# Patient Record
Sex: Female | Born: 1935 | Race: White | Hispanic: No | Smoking: Former smoker
Health system: Southern US, Community
[De-identification: ages and names within clinical notes are randomized; demographics above are authoritative.]

## PROBLEM LIST (undated history)

## (undated) DIAGNOSIS — I110 Hypertensive heart disease with heart failure: Secondary | ICD-10-CM

## (undated) DIAGNOSIS — F329 Major depressive disorder, single episode, unspecified: Secondary | ICD-10-CM

## (undated) DIAGNOSIS — Z8673 Personal history of transient ischemic attack (TIA), and cerebral infarction without residual deficits: Secondary | ICD-10-CM

## (undated) DIAGNOSIS — I639 Cerebral infarction, unspecified: Secondary | ICD-10-CM

## (undated) DIAGNOSIS — D509 Iron deficiency anemia, unspecified: Secondary | ICD-10-CM

## (undated) DIAGNOSIS — I5033 Acute on chronic diastolic (congestive) heart failure: Secondary | ICD-10-CM

## (undated) DIAGNOSIS — M810 Age-related osteoporosis without current pathological fracture: Secondary | ICD-10-CM

## (undated) DIAGNOSIS — Z79890 Hormone replacement therapy (postmenopausal): Secondary | ICD-10-CM

## (undated) DIAGNOSIS — S42209A Unspecified fracture of upper end of unspecified humerus, initial encounter for closed fracture: Secondary | ICD-10-CM

## (undated) DIAGNOSIS — J441 Chronic obstructive pulmonary disease with (acute) exacerbation: Secondary | ICD-10-CM

## (undated) DIAGNOSIS — F419 Anxiety disorder, unspecified: Secondary | ICD-10-CM

## (undated) DIAGNOSIS — E785 Hyperlipidemia, unspecified: Secondary | ICD-10-CM

## (undated) DIAGNOSIS — I729 Aneurysm of unspecified site: Secondary | ICD-10-CM

## (undated) DIAGNOSIS — D72829 Elevated white blood cell count, unspecified: Secondary | ICD-10-CM

## (undated) DIAGNOSIS — G20 Parkinson's disease: Secondary | ICD-10-CM

## (undated) DIAGNOSIS — I10 Essential (primary) hypertension: Secondary | ICD-10-CM

## (undated) DIAGNOSIS — F0280 Dementia in other diseases classified elsewhere without behavioral disturbance: Secondary | ICD-10-CM

## (undated) DIAGNOSIS — G214 Vascular parkinsonism: Secondary | ICD-10-CM

## (undated) DIAGNOSIS — L89154 Pressure ulcer of sacral region, stage 4: Secondary | ICD-10-CM

## (undated) HISTORY — DX: Essential (primary) hypertension: I10

## (undated) HISTORY — DX: Major depressive disorder, single episode, unspecified: F32.9

## (undated) HISTORY — DX: Unspecified fracture of upper end of unspecified humerus, initial encounter for closed fracture: S42.209A

## (undated) HISTORY — DX: Dementia in other diseases classified elsewhere without behavioral disturbance: F02.80

## (undated) HISTORY — DX: Parkinson's disease: G20

## (undated) HISTORY — DX: Aneurysm of unspecified site: I72.9

## (undated) HISTORY — DX: Age-related osteoporosis without current pathological fracture: M81.0

## (undated) HISTORY — DX: Pressure ulcer of sacral region, stage 4: L89.154

## (undated) HISTORY — DX: Acute on chronic diastolic (congestive) heart failure: I50.33

## (undated) HISTORY — DX: Hyperlipidemia, unspecified: E78.5

## (undated) HISTORY — DX: Hormone replacement therapy: Z79.890

## (undated) HISTORY — DX: Hypertensive heart disease with heart failure: I11.0

## (undated) HISTORY — DX: Cerebral infarction, unspecified: I63.9

## (undated) HISTORY — DX: Chronic obstructive pulmonary disease with (acute) exacerbation: J44.1

## (undated) HISTORY — DX: Personal history of transient ischemic attack (TIA), and cerebral infarction without residual deficits: Z86.73

## (undated) HISTORY — DX: Elevated white blood cell count, unspecified: D72.829

## (undated) HISTORY — DX: Anxiety disorder, unspecified: F41.9

## (undated) HISTORY — DX: Vascular parkinsonism: G21.4

## (undated) HISTORY — DX: Iron deficiency anemia, unspecified: D50.9

## (undated) HISTORY — PX: EYE SURGERY: SHX253

---

## 1966-05-08 HISTORY — PX: PARTIAL HYSTERECTOMY: SHX80

## 1997-12-08 ENCOUNTER — Other Ambulatory Visit: Admission: RE | Admit: 1997-12-08 | Discharge: 1997-12-08 | Payer: Self-pay | Admitting: Family Medicine

## 2000-01-25 ENCOUNTER — Encounter: Admission: RE | Admit: 2000-01-25 | Discharge: 2000-01-25 | Payer: Self-pay | Admitting: Family Medicine

## 2000-01-25 ENCOUNTER — Encounter: Payer: Self-pay | Admitting: Family Medicine

## 2002-02-04 ENCOUNTER — Encounter: Payer: Self-pay | Admitting: Family Medicine

## 2002-02-04 ENCOUNTER — Encounter: Admission: RE | Admit: 2002-02-04 | Discharge: 2002-02-04 | Payer: Self-pay | Admitting: Family Medicine

## 2007-05-14 ENCOUNTER — Encounter: Admission: RE | Admit: 2007-05-14 | Discharge: 2007-05-14 | Payer: Self-pay | Admitting: *Deleted

## 2008-10-06 DIAGNOSIS — I639 Cerebral infarction, unspecified: Secondary | ICD-10-CM

## 2008-10-06 HISTORY — DX: Cerebral infarction, unspecified: I63.9

## 2008-10-09 ENCOUNTER — Encounter: Payer: Self-pay | Admitting: Emergency Medicine

## 2008-10-09 ENCOUNTER — Ambulatory Visit: Payer: Self-pay | Admitting: Diagnostic Radiology

## 2008-10-10 ENCOUNTER — Ambulatory Visit: Payer: Self-pay | Admitting: Cardiology

## 2008-10-10 ENCOUNTER — Inpatient Hospital Stay (HOSPITAL_COMMUNITY): Admission: EM | Admit: 2008-10-10 | Discharge: 2008-10-13 | Payer: Self-pay | Admitting: Internal Medicine

## 2008-10-12 ENCOUNTER — Encounter (INDEPENDENT_AMBULATORY_CARE_PROVIDER_SITE_OTHER): Payer: Self-pay | Admitting: Internal Medicine

## 2008-10-12 ENCOUNTER — Ambulatory Visit: Payer: Self-pay | Admitting: Vascular Surgery

## 2008-10-12 ENCOUNTER — Ambulatory Visit: Payer: Self-pay | Admitting: Physical Medicine & Rehabilitation

## 2010-08-15 LAB — COMPREHENSIVE METABOLIC PANEL
Albumin: 4.4 g/dL (ref 3.5–5.2)
BUN: 17 mg/dL (ref 6–23)
Chloride: 102 mEq/L (ref 96–112)
Creatinine, Ser: 1.1 mg/dL (ref 0.4–1.2)
GFR calc non Af Amer: 48 mL/min — ABNORMAL LOW (ref >60)
Total Bilirubin: 0.2 mg/dL — ABNORMAL LOW (ref 0.3–1.2)

## 2010-08-15 LAB — CARDIAC PANEL(CRET KIN+CKTOT+MB+TROPI)
CK, MB: 2.2 ng/mL (ref 0.3–4.0)
CK, MB: 2.4 ng/mL (ref 0.3–4.0)
Relative Index: INVALID (ref 0.0–2.5)
Relative Index: INVALID (ref 0.0–2.5)
Total CK: 60 U/L (ref 7–177)
Total CK: 60 U/L (ref 7–177)
Troponin I: 0.02 ng/mL (ref 0.00–0.06)

## 2010-08-15 LAB — PROTIME-INR: Prothrombin Time: 12.4 seconds (ref 11.6–15.2)

## 2010-08-15 LAB — URINALYSIS, ROUTINE W REFLEX MICROSCOPIC
Ketones, ur: NEGATIVE mg/dL
Nitrite: NEGATIVE
Protein, ur: NEGATIVE mg/dL
Urobilinogen, UA: 0.2 mg/dL (ref 0.0–1.0)

## 2010-08-15 LAB — LIPID PANEL
LDL Cholesterol: 140 mg/dL — ABNORMAL HIGH (ref 0–99)
Triglycerides: 130 mg/dL (ref <150)
VLDL: 26 mg/dL (ref 0–40)

## 2010-08-15 LAB — CBC
HCT: 38.8 % (ref 36.0–46.0)
HCT: 43.4 % (ref 36.0–46.0)
MCHC: 33.5 g/dL (ref 30.0–36.0)
MCHC: 34 g/dL (ref 30.0–36.0)
MCV: 92.7 fL (ref 78.0–100.0)
MCV: 92.7 fL (ref 78.0–100.0)
Platelets: 192 10*3/uL (ref 150–400)
Platelets: 213 10*3/uL (ref 150–400)
RBC: 4.19 MIL/uL (ref 3.87–5.11)
RDW: 11.7 % (ref 11.5–15.5)
WBC: 10.4 10*3/uL (ref 4.0–10.5)
WBC: 9.4 10*3/uL (ref 4.0–10.5)

## 2010-08-15 LAB — APTT: aPTT: 35 seconds (ref 24–37)

## 2010-08-15 LAB — POCT CARDIAC MARKERS
CKMB, poc: 1.4 ng/mL (ref 1.0–8.0)
Myoglobin, poc: 50.8 ng/mL (ref 12–200)

## 2010-08-15 LAB — BASIC METABOLIC PANEL
BUN: 13 mg/dL (ref 6–23)
CO2: 27 mEq/L (ref 19–32)
Chloride: 107 mEq/L (ref 96–112)
Potassium: 3.5 mEq/L (ref 3.5–5.1)

## 2010-08-15 LAB — HOMOCYSTEINE: Homocysteine: 7.1 umol/L (ref 4.0–15.4)

## 2010-08-15 LAB — DIFFERENTIAL
Basophils Absolute: 0 10*3/uL (ref 0.0–0.1)
Lymphocytes Relative: 29 % (ref 12–46)
Monocytes Absolute: 0.8 10*3/uL (ref 0.1–1.0)
Neutro Abs: 6.4 10*3/uL (ref 1.7–7.7)

## 2010-08-15 LAB — TSH: TSH: 2.09 u[IU]/mL (ref 0.350–4.500)

## 2010-08-15 LAB — POCT TOXICOLOGY PANEL

## 2010-08-15 LAB — RPR: RPR Ser Ql: NONREACTIVE

## 2010-09-20 NOTE — Consult Note (Signed)
NAME:  Beverly Black, Beverly Black NO.:  [id number]   MEDICAL RECORD NO.:  [id number]          PATIENT TYPE:  INP   LOCATION:  1408                         FACILITY:  [facility]   PHYSICIAN:  Noel Christmas, MD    DATE OF BIRTH:  [birth year]   DATE OF CONSULTATION:  10/10/2008  DATE OF DISCHARGE:                                 CONSULTATION   NEUROLOGICAL CONSULTATION   REASON FOR CONSULTATION:  Acute left cerebral infarction.   HISTORY OF PRESENT ILLNESS:  This is a 77 year old lady who presented  with right sided weakness and slurred speech to [facility] emergency room last night.  The patient woke up with difficulty  with ambulation and right upper extremity weakness as well as slurred  speech.  The slurred speech worsening during the day.  Symptoms improved  while she was in the emergency room on initial evaluation.  CT scan of  the head showed no acute intracranial abnormalities.  MRI study today  showed findings consistent with left acute thalamic stroke with  extension to the corona radiata region.  Underlying bilateral small  vessel microischemic changes were also noted.  MRA showed  atherosclerotic changes as well as fenestration of the vertebrobasilar  junction, hypoplastic or distally stenotic right vertebral artery as  well as distal middle cerebral artery and PCA branch attentuation  compatible with chronic small vessel disease.  No significant occlusive  disease was seen.  The patient has no previous history of stroke or TIA  clinically.  She has not been on antiplatelet therapy.   PAST MEDICAL HISTORY:  Is remarkable for:  1. Hyperlipidemia.  2. Hypertension.  3. Chronic anxiety.  4. Allergies.   CURRENT MEDICATIONS:  1. Lisinopril 20 mg per day.  2. Zocor 40 mg per day.  3. Allegra 180 mg per day.  4. Mirtazapine 30 mg at bedtime.  5. Atenolol 50 mg twice a day.  6. Darvocet-N 100, 1 to 2 per day p.r.n.  7. Premarin 0.3 mg per day.  8. Lorazepam 0.5 mg t.i.d. p.r.n. anxiety.   FAMILY HISTORY:  Is positive for stroke involving her father.  The  patient is not aware of any additional family history.  Most of her  family lives in [place] and she apparently does not have close contact  with them.   PHYSICAL EXAMINATION:  NEUROLOGICAL:  Appearance was that of an elderly  lady of slender to medium build who was alert and cooperative and in no  acute distress.  She was well oriented to time as well as place.  Short  term and long term memory were normal.  Affect was appropriate.  Patient  had no indication of aphasia.  Pupils were equal and reacted normally to  light.  Extraocular movements were full and conjugate.  Visual fields  were intact and normal.  There was no facial numbness and no facial  weakness.  Hearing was normal.  Speech and algorithm were normal.  Motor  exam showed mild pronator drift of the right upper extremity.  She had  reduced hand grip on the  right as well which was mild (4/5) compared to  the left.  Strength in the lower extremities was normal proximally and  distally.  Deep tendon reflexes were 1+ and symmetrical in the upper  extremities and at the knees; absent at the ankles.  Plantar responses  were flexor.  Sensory examination was normal.  Carotid auscultation was  normal.   CLINICAL IMPRESSION:  Acute left thalamic and corona radiata infarction,  most likely due to small vessel disease associated with longstanding  hypertension.   RECOMMENDATIONS:  1. MRA of the neck with contrast.  2. Echocardiogram as planned.  3. Aspirin 81 mg per day.  4. Physical therapy and occupational therapy consults.   Thank you for asking me to evaluate Ms. Comas.      Noel Christmas, MD  Electronically Signed     CS/MEDQ  D:  10/10/2008  T:  10/11/2008  Job:  [id number]

## 2010-09-20 NOTE — H&P (Signed)
NAME:  Beverly Black, Beverly Black NO.:  [id number]   MEDICAL RECORD NO.:  [id number]          PATIENT TYPE:  INP   LOCATION:  1408                         FACILITY:  [facility]   PHYSICIAN:  Vania Rea, M.D. DATE OF BIRTH:  [birth year]   DATE OF ADMISSION:  10/10/2008  DATE OF DISCHARGE:                              HISTORY & PHYSICAL   PRIMARY CARE PHYSICIAN:  Bertram Millard. Hyacinth Meeker, M.D. of [facility].   CHIEF COMPLAINT:  Transient ischemic attack.   HISTORY OF PRESENT ILLNESS:  This is a 77 year old Caucasian lady who by  history, woke up with right-sided weakness and difficult to walk  yesterday, but chose not to come to the doctor.  Her son noticed the  right-sided weakness later in the day and brought her to the [facility]  Emergency Room sometime after 9 p.m. at which time the weakness had  resolved, but she was noted to have markedly elevated blood pressure and  the Hospitalist Service was called to assist with management.  The  patient was transferred to the [facility] Emergency Room for admission  with preliminary orders.  However, on arrival to the patient's room at  3:15 to get her complete history and physical, the patient is somewhat  irritable.  She is refusing to give any history and refusing to be  examined.  The history is constructed by reviewing the chart and  superficial inspection of this patient.  She currently denies any  problems and also denies having had any weakness this morning.   PAST MEDICAL HISTORY:  1. Hypertension.  2. Hyperlipidemia.   MEDICATIONS:  1. Darvocet 100 one-two tablets daily.  2. Premarin 0.3 mg daily.  3. Lorazepam 0.5 mg 3 times daily as needed.  4. Lisinopril 20 mg daily.  5. Zocor 40 mg daily.  6. Allegra 180 mg daily.  7. Mirtazapine 30 mg at bedtime.  8. Atenolol 50 mg twice daily.   ALLERGIES:  IODINE.   SOCIAL HISTORY:  Reportedly does not drink.  Possibly a smoker.   FAMILY HISTORY:  Unable to  obtain.   REVIEW OF SYSTEMS:  Unable to obtain further.   PHYSICAL EXAMINATION:  GENERAL:  Thin, elderly and irritable lady lying  in bed.  She professes to be oriented to place, but refuses to state  where she is.  She appears to have a right-sided facial weakness.  She  is in no acute distress.  VITAL SIGNS:  Temperature is 98.1, pulse 65, respirations 18, blood  pressure is 179/83. Initially at [facility] Emergency Room, her blood  pressure was 234/101 and she did receive one dose of clonidine 0.1 mg.  Other than this, we are unable to assess.  EXTREMITIES:  The patient is definitely moving both arms equally, but  unable to perform any other exam.   LABORATORY DATA:  CBC is reviewed and is completely normal with a white  count of 10.4, hemoglobin 14.8, platelets of 213.  Her PT/INR and PTT  are normal.  Her urine drug screen and alcohol level were normal.  Complete metabolic panel significant  for glucose of 133, BUN of 17,  creatinine of 1.1, it is otherwise completely normal.  Her cardiac  enzymes are likewise completely normal.  Urinalysis again is completely  normal.  Her urine drug screen was positive for benzodiazepines.   DIAGNOSTICS:  1. CT scan of the brain shows mild diffuse cerebral and cerebellar      atrophy, moderate chronic small vessel white matter ischemic      changes, acute right sphenoid sinusitis and chronic right ethmoid      sinusitis.  2. Two-view of her chest shows mild changes of COPD, previous      granulomatous infection.  No acute abnormality.   ASSESSMENT:  The patient will be admitted with diagnosis of acute stroke  since she continues to have right-sided facial weakness.   PLAN:  We will allow permissive hypertension, but treat systolic blood  pressure greater than 200.  Because her EKG shows ST depression, we will  go ahead and cycle her cardiac enzymes.  We will hydrate her.  We will  check her cardiovascular risk factors and order an MRI.  She  may benefit  from a neurological evaluation later during the day.  Other plans as per  orders.      Vania Rea, M.D.  Electronically Signed     LC/MEDQ  D:  10/10/2008  T:  10/10/2008  Job:  [id number]   cc:   Bertram Millard. Hyacinth Meeker, M.D.  Fax: [phone]

## 2010-09-20 NOTE — Discharge Summary (Signed)
NAME:  Beverly Black, Beverly Black NO.:  [id number]   MEDICAL RECORD NO.:  [id number]          PATIENT TYPE:  INP   LOCATION:  1408                         FACILITY:  [facility]   PHYSICIAN:  Marcellus Scott, MD     DATE OF BIRTH:  [birth year]   DATE OF ADMISSION:  10/10/2008  DATE OF DISCHARGE:                               DISCHARGE SUMMARY   PRIMARY MEDICAL DOCTOR:  Beverly Black Lefevre, MD   DISCHARGE DIAGNOSES:  1. Acute left thalamic and corona radiata cerebrovascular      accident/Infarct.  2. Uncontrolled hypertension.  3. Hyperlipidemia.  4. Tobacco abuse.   DISCHARGE MEDICATIONS:  1. Darvocet-N 100, 1 or 2 tablets p.o. daily.  2. Premarin 0.3 mg p.o. daily.  3. Lorazepam 0.5 mg p.o. t.i.d. p.r.n. for anxiety.  4. Lisinopril 20 mg p.o. daily.  5. Allegra 180 mg p.o. daily.  6. Mirtazapine 30 mg p.o. q.h.s.  7. Atenolol 50 mg p.o. b.i.d.  8. Vytorin 10/40 one p.o. q.h.s.  9. Enteric-coated aspirin 81 mg p.o. daily.   DISCONTINUED MEDICATIONS:  Zocor.   PROCEDURES:  1. Bilateral carotid artery Dopplers.  Preliminary report is 40-59%      ICA stenosis bilaterally.  2. MRA of the neck without and with contrast.  Impression:      a.     High-grade stenosis of the proximal vertebral arteries       bilaterally.  The left vertebral artery is dominant.  There is       likely very slow flow in the residual right vertebral artery given       the lack of signal on time of flight imaging.      b.     No significant stenosis of the proximal internal carotid       artery.      c.     High-grade stenosis of the proximal right external carotid       artery.  3. MRI of the head without contrast.  Impression:      a.     Acute focal infarct in the lateral left thalamus extending       into the corona radiata.      b.     Extensive periventricular subcortical white matter disease       likely reflects the sequelae of chronic microvascular ischemia.      c.     Extensive sinus disease  with both acute and chronic       findings.  4. MRA of the head.      a.     Distal MCA and PCA branch vessel attenuation bilaterally       compatible with chronic small-vessel disease.      b.     Fenestration of the vertebral-basilar junction.      c.     Hypoplastic or distally stenotic right vertebral artery.      d.     No significant proximal occlusion or aneurysm.  5. Chest x-ray on Jon 4.  Impression:      a.  Marked changes of COPD.      b.     Previous granulomatous infection.      c.     No acute abnormality.  6. CT of the head without contrast.  Impression:      a.     Mild diffuse cerebral and cerebellar atrophy.      b.     Moderate chronic small-vessel white matter ischemic changes       in both cerebral hemispheres.      c.     Acute right sphenoid sinusitis and chronic right ethmoid       sinusitis.  7. Echocardiogram to be done.   PERTINENT LABORATORY DATA:  Urine culture:  50,000 colonies per  milliliter Lactobacillus species.  Cardiac enzymes cycled and negative.  Thyroid function tests within normal limit.  Homocystine 7.1.  RPR  nonreactive.  Lipid panel with HDL 43, LDL 140.  Basic metabolic panel  unremarkable with BUN 13, creatinine 0.82.  CBC with hemoglobin 13,  hematocrit 38, white blood cell 9.4, platelets 192.  Urine drug screen  positive for benzodiazepines.  Urinalysis was negative for features of  urinary tract infection.  Blood alcohol level less than 5.   CONSULTATIONS:  Neurology, Dr. Roseanne Reno and Dr. Anne Hahn.   HOSPITAL COURSE AND PATIENT DISPOSITION:  Beverly Black is a pleasant 77-  year-old female patient with history of hypertension who on Rosalind 4 woke  up at approximately 4 a.m. with weakness on the right side, slurred  speech.  She did not seek any immediate medical attention.  However,  when her son saw her later in the day he found her to be dragging her  right foot, facial asymmetry and slurred speech.  He brought her to the  [facility] emergency room after 9 p.m.  At this time the  weakness was much better.  We were asked to admit her for further  evaluation and management.   1. Acute left thalamic infarct and corona radiata CVA with right      hemiparesis, facial asymmetry, dysarthria.  The patient was      admitted to telemetry with no arrhythmia alarms.  Her MRI confirmed      the CVA.  The patient was placed on aspirin.  Her antihypertensive      medications were held allowing for permissive hypertension given      the recent stroke.  Her signs and symptoms of right-sided weakness,      slurred speech and facial asymmetry have progressively improved.      Physical therapy and occupational therapy were consulted.  They      recommended an inpatient rehab consult.  The inpatient rehab      physicians saw the patient and thought that the patient had      improved and recommended re-evaluating the patient by physical      therapy and possible discharge home with home therapies.  At this      time we await their input and the echocardiogram results.  The      patient is to follow up with Dr. Anne Hahn as an outpatient in 3      weeks.  2. Uncontrolled hypertension.  The patient is more than 3 days out of      her event.  We will resume her home medications.  3. Hyperlipidemia.  The patient's lipids are still abnormal despite      being on Zocor.  This has been  changed to Vytorin.  Recommend      repeating fasting lipids in 4 weeks.  4. Tobacco abuse.  Cessation counseling done.  The patient declined a      patch.  5. Skin rash on the left lateral mid thigh and back, which initially      started as a blister on the thigh which ruptured.  Unclear if this      is a fungal infection or an allergic reaction.  In any event, this      is to be monitored as an outpatient and treated as deemed      necessary.   At this time will await echocardiogram and physical therapy input prior  to deciding about discharge and  discharge location.   TIME TAKEN IN COORDINATING THIS DISCHARGE:  25 minutes.      Marcellus Scott, MD  Electronically Signed     AH/MEDQ  D:  10/12/2008  T:  10/12/2008  Job:  [id number]   cc:   Beverly Black Lefevre, MD   Noel Christmas, MD  Fax: [phone]   C. Lesia Sago, M.D.  Fax: [phone]

## 2010-11-22 ENCOUNTER — Telehealth: Payer: Self-pay | Admitting: *Deleted

## 2010-11-22 NOTE — Telephone Encounter (Signed)
Pt has not been seen by Dr Beverely Low yet, I wrote that on fax with notation that we cannot fill rx. (Lorazepam).

## 2010-11-30 ENCOUNTER — Ambulatory Visit (INDEPENDENT_AMBULATORY_CARE_PROVIDER_SITE_OTHER): Payer: 59 | Admitting: Family Medicine

## 2010-11-30 ENCOUNTER — Encounter: Payer: Self-pay | Admitting: Family Medicine

## 2010-11-30 DIAGNOSIS — F411 Generalized anxiety disorder: Secondary | ICD-10-CM

## 2010-11-30 DIAGNOSIS — Z79890 Hormone replacement therapy (postmenopausal): Secondary | ICD-10-CM | POA: Insufficient documentation

## 2010-11-30 DIAGNOSIS — F419 Anxiety disorder, unspecified: Secondary | ICD-10-CM

## 2010-11-30 DIAGNOSIS — I639 Cerebral infarction, unspecified: Secondary | ICD-10-CM | POA: Insufficient documentation

## 2010-11-30 DIAGNOSIS — I10 Essential (primary) hypertension: Secondary | ICD-10-CM

## 2010-11-30 DIAGNOSIS — M259 Joint disorder, unspecified: Secondary | ICD-10-CM

## 2010-11-30 DIAGNOSIS — I110 Hypertensive heart disease with heart failure: Secondary | ICD-10-CM | POA: Insufficient documentation

## 2010-11-30 DIAGNOSIS — E785 Hyperlipidemia, unspecified: Secondary | ICD-10-CM

## 2010-11-30 DIAGNOSIS — R29898 Other symptoms and signs involving the musculoskeletal system: Secondary | ICD-10-CM | POA: Insufficient documentation

## 2010-11-30 DIAGNOSIS — I6350 Cerebral infarction due to unspecified occlusion or stenosis of unspecified cerebral artery: Secondary | ICD-10-CM

## 2010-11-30 HISTORY — DX: Hormone replacement therapy: Z79.890

## 2010-11-30 HISTORY — DX: Hypertensive heart disease with heart failure: I11.0

## 2010-11-30 HISTORY — DX: Cerebral infarction, unspecified: I63.9

## 2010-11-30 HISTORY — DX: Anxiety disorder, unspecified: F41.9

## 2010-11-30 LAB — TSH: TSH: 1.84 u[IU]/mL (ref 0.35–5.50)

## 2010-11-30 LAB — CBC WITH DIFFERENTIAL/PLATELET
Basophils Absolute: 0.1 10*3/uL (ref 0.0–0.1)
Eosinophils Absolute: 0.4 10*3/uL (ref 0.0–0.7)
Lymphocytes Relative: 30.4 % (ref 12.0–46.0)
MCHC: 34.1 g/dL (ref 30.0–36.0)
Neutrophils Relative %: 58.9 % (ref 43.0–77.0)
Platelets: 256 10*3/uL (ref 150.0–400.0)
RDW: 12.9 % (ref 11.5–14.6)

## 2010-11-30 LAB — HEPATIC FUNCTION PANEL
ALT: 14 U/L (ref 0–35)
AST: 18 U/L (ref 0–37)
Alkaline Phosphatase: 64 U/L (ref 39–117)
Bilirubin, Direct: 0 mg/dL (ref 0.0–0.3)
Total Bilirubin: 0.3 mg/dL (ref 0.3–1.2)

## 2010-11-30 LAB — BASIC METABOLIC PANEL
Chloride: 104 mEq/L (ref 96–112)
Creatinine, Ser: 0.8 mg/dL (ref 0.4–1.2)
Potassium: 4.1 mEq/L (ref 3.5–5.1)
Sodium: 138 mEq/L (ref 135–145)

## 2010-11-30 LAB — LDL CHOLESTEROL, DIRECT: Direct LDL: 140.8 mg/dL

## 2010-11-30 LAB — LIPID PANEL: HDL: 48.6 mg/dL (ref >39.00)

## 2010-11-30 NOTE — Progress Notes (Signed)
  Subjective:    Patient ID: Beverly Black, female    DOB: [birth year], 77 y.o.   MRN: [id number]  HPI New to establish.  Previous MD- Jacalyn Lefevre  HTN- chronic problem for pt, excellent control today.  On Norvasc, Atenolol, lisinopril.  No CP, SOB, HAs, visual changes, edema.  Hyperlipidemia- chronic problem for pt, on Lipitor.  Last had labs done in October.  No abd pain, N/V, myalgias  Hx of CVA- sees Dr Pearlean Brownie, stroke occurred 6/10.  Walks w/ a cane.  Has residual R sided facial droop and R hand weakness.  Hormone replacement tx- has been on Premarin for 'years and years', ~19 yrs.  Feels 'if it hasn't caused me problems by now, why change it?'  Anxiety- chronic problem for pt, on Lorazepam.  This controls sxs fairly well.  Weak Ankle- L ankle, has been a problem for pt for years.  Neuro suggested ortho referral.  Pt and daughter now interested in this.  Health Maintenance- had mammo and DEXA 2 yrs ago, has never had colonoscopy- not interested.   Review of Systems For ROS see HPI     Objective:   Physical Exam  Vitals reviewed. Constitutional: She is oriented to person, place, and time. She appears well-developed and well-nourished. No distress.  HENT:  Head: Normocephalic and atraumatic.       Mild R facial droop  Eyes: Conjunctivae and EOM are normal. Pupils are equal, round, and reactive to light.  Neck: Neck supple. No thyromegaly present.  Cardiovascular: Normal rate, regular rhythm, normal heart sounds and intact distal pulses.   Pulmonary/Chest: Effort normal and breath sounds normal. No respiratory distress. She has no wheezes. She has no rales.  Abdominal: Soft. Bowel sounds are normal. She exhibits no distension. There is no tenderness. There is no rebound.  Musculoskeletal: She exhibits no edema.  Lymphadenopathy:    She has no cervical adenopathy.  Neurological: She is alert and oriented to person, place, and time.  Skin: Skin is warm and dry.  Psychiatric:  She has a normal mood and affect. Her behavior is normal. Judgment and thought content normal.          Assessment & Plan:

## 2010-11-30 NOTE — Patient Instructions (Signed)
Follow up in 6 months for your complete physical Please ask Dr Pearlean Brownie about the Premarin- we can wean you off this if need be We'll notify you of your lab results Someone will call you with your ortho appt Call with any questions or concerns Welcome!  We're glad to have you!

## 2010-12-02 ENCOUNTER — Telehealth: Payer: Self-pay | Admitting: *Deleted

## 2010-12-02 MED ORDER — ATORVASTATIN CALCIUM 20 MG PO TABS: 20.0000 mg | ORAL_TABLET | Freq: Every day | ORAL | Status: DC

## 2010-12-02 NOTE — Telephone Encounter (Signed)
Message copied by Leanne Lovely on Fri Dec 02, 2010  3:22 PM ------      Message from: Sheliah Hatch      Created: Thu Dec 01, 2010  7:57 AM       Based on elevated total cholesterol, LDL and triglycerides would recommend increasing Lipitor to 20mg  nightly.            Remainder of labs look good!

## 2010-12-02 NOTE — Telephone Encounter (Signed)
Pt is aware.  

## 2010-12-06 ENCOUNTER — Encounter: Payer: Self-pay | Admitting: Family Medicine

## 2010-12-08 ENCOUNTER — Other Ambulatory Visit: Payer: Self-pay | Admitting: *Deleted

## 2010-12-08 MED ORDER — ATENOLOL 50 MG PO TABS: 50.0000 mg | ORAL_TABLET | Freq: Two times a day (BID) | ORAL | Status: DC

## 2010-12-08 NOTE — Telephone Encounter (Signed)
Rx faxed

## 2010-12-18 NOTE — Assessment & Plan Note (Signed)
Refer to ortho at pt's request.  This was previously recommended by neuro.  Will f/u on recommendations.

## 2010-12-18 NOTE — Assessment & Plan Note (Signed)
Pt does not take controller med.  Has been taking benzos 'for years' w/ good sxs control.  Although this regimen is not ideal it will likely be very difficult to change after all this time.  Will leave current regimen and follow.

## 2010-12-18 NOTE — Assessment & Plan Note (Signed)
Follows w/ neuro regularly.  Has some residual deficits.  Ambulates w/ cane.  Goal is risk reduction.

## 2010-12-18 NOTE — Assessment & Plan Note (Signed)
Due for labs.  Goal is <70 due to CVA.  Adjust meds prn.

## 2010-12-18 NOTE — Assessment & Plan Note (Signed)
Well controlled today.  Tolerating meds w/out difficulty.  No changes.

## 2010-12-18 NOTE — Assessment & Plan Note (Signed)
Given pt's age and the fact that she's already had CVA, encouraged her to discuss weaning off this medicine w/ neuro.  If neuro wants her to stop it, we can work w/ her on a slow taper/wean.  If neuro is comfortable w/ her remaining on it, will most likely leave it alone since it has been nearly 20 yrs.  Pt expressed understanding and is in agreement w/ plan.

## 2010-12-23 ENCOUNTER — Other Ambulatory Visit: Payer: Self-pay | Admitting: Family Medicine

## 2010-12-23 MED ORDER — LORAZEPAM 0.5 MG PO TABS: 0.5000 mg | ORAL_TABLET | Freq: Three times a day (TID) | ORAL | Status: DC | PRN

## 2010-12-23 MED ORDER — TRAMADOL HCL 50 MG PO TABS: 50.0000 mg | ORAL_TABLET | Freq: Three times a day (TID) | ORAL | Status: DC | PRN

## 2010-12-23 NOTE — Telephone Encounter (Signed)
rx called into pharmacy

## 2010-12-23 NOTE — Telephone Encounter (Signed)
Ok for #90 w/ 1 refill on each

## 2010-12-23 NOTE — Telephone Encounter (Signed)
pls advise about pt's tramadol and lorazepam

## 2011-02-07 ENCOUNTER — Other Ambulatory Visit: Payer: Self-pay | Admitting: Family Medicine

## 2011-02-07 MED ORDER — AMLODIPINE BESYLATE 10 MG PO TABS: 10.0000 mg | ORAL_TABLET | Freq: Every day | ORAL | Status: DC

## 2011-02-07 NOTE — Telephone Encounter (Signed)
Done

## 2011-02-13 ENCOUNTER — Other Ambulatory Visit: Payer: Self-pay | Admitting: Family Medicine

## 2011-02-21 ENCOUNTER — Other Ambulatory Visit: Payer: Self-pay | Admitting: Family Medicine

## 2011-02-21 MED ORDER — MIRTAZAPINE 30 MG PO TABS: 30.0000 mg | ORAL_TABLET | Freq: Every day | ORAL | Status: DC

## 2011-02-21 NOTE — Telephone Encounter (Signed)
Ok for #90, 3 refills 

## 2011-02-21 NOTE — Telephone Encounter (Signed)
Last OV 11/30/10. Last fill unknown

## 2011-02-24 ENCOUNTER — Other Ambulatory Visit: Payer: Self-pay | Admitting: *Deleted

## 2011-02-24 MED ORDER — LORAZEPAM 0.5 MG PO TABS: 0.5000 mg | ORAL_TABLET | Freq: Three times a day (TID) | ORAL | Status: DC | PRN

## 2011-02-24 NOTE — Telephone Encounter (Signed)
Ok for #30 unless pt wants to wait for Dr Laury Axon on Monday to approve #90

## 2011-02-24 NOTE — Telephone Encounter (Signed)
Last seen 11-30-10 last filled 01-23-11 #90, please advise

## 2011-02-24 NOTE — Telephone Encounter (Signed)
Sorry for the confusion!  Ok for #90, 1 refill

## 2011-02-24 NOTE — Telephone Encounter (Signed)
Rx printed and faxed     KP 

## 2011-02-24 NOTE — Telephone Encounter (Signed)
Your patient 

## 2011-04-03 ENCOUNTER — Ambulatory Visit (INDEPENDENT_AMBULATORY_CARE_PROVIDER_SITE_OTHER): Payer: 59 | Admitting: Family Medicine

## 2011-04-03 ENCOUNTER — Encounter: Payer: Self-pay | Admitting: Family Medicine

## 2011-04-03 DIAGNOSIS — M62838 Other muscle spasm: Secondary | ICD-10-CM

## 2011-04-03 DIAGNOSIS — H6120 Impacted cerumen, unspecified ear: Secondary | ICD-10-CM

## 2011-04-03 NOTE — Progress Notes (Signed)
  Subjective:    Patient ID: Beverly Black, female    DOB: Mar 05, 1929, 77 y.o.   MRN: [id number]  HPI L ear clogged- 'feels like something's buzzing in there'.  No pain.  Intermittent.  'it's like i'm in a barrel'  Impacting hearing.  sxs started 1-2 weeks ago.  Neck pain- sxs started Thursday.  Pain w/ turning head to R, will radiate into R shoulder.  Pain improves w/ Advil.  No change in pillows, mattress, sleeping arrangements.    Review of Systems For ROS see HPI     Objective:   Physical Exam  Vitals reviewed. Constitutional: She appears well-developed and well-nourished. No distress.  HENT:       TMs both obscured by dry wax.  S/p softening w/ H2O2 and irrigation, wax is gone and TMs WNL bilaterally  Neck: Normal range of motion. Neck supple.       R trap spasm w/ TTP  Lymphadenopathy:    She has no cervical adenopathy.          Assessment & Plan:

## 2011-04-03 NOTE — Patient Instructions (Signed)
Your neck pain is due to muscle spasm Use a heating pad Take Advil 2 tabs every 6-8 hrs as needed for pain (take w/ food) Call with any questions or concerns or if not improving Hang in there! Happy Holidays!

## 2011-04-04 DIAGNOSIS — M62838 Other muscle spasm: Secondary | ICD-10-CM | POA: Insufficient documentation

## 2011-04-04 DIAGNOSIS — H6120 Impacted cerumen, unspecified ear: Secondary | ICD-10-CM | POA: Insufficient documentation

## 2011-04-04 NOTE — Assessment & Plan Note (Signed)
Start NSAIDs, heat, muscle relaxer at night.  Reviewed supportive care and red flags that should prompt return.  Pt expressed understanding and is in agreement w/ plan.

## 2011-04-04 NOTE — Assessment & Plan Note (Signed)
Both ears.  Unable to curette due to dryness.  Both TMs visible after irrigation.  Pt reports sxs improved immediately.

## 2011-04-20 ENCOUNTER — Other Ambulatory Visit: Payer: Self-pay | Admitting: Family Medicine

## 2011-04-20 NOTE — Telephone Encounter (Signed)
Last OV 04-03-11 last refill 02-24-11

## 2011-04-20 NOTE — Telephone Encounter (Signed)
Ok for refill x 1 

## 2011-04-21 MED ORDER — LORAZEPAM 0.5 MG PO TABS: 0.5000 mg | ORAL_TABLET | Freq: Three times a day (TID) | ORAL | Status: DC | PRN

## 2011-04-21 NOTE — Telephone Encounter (Signed)
.  rx faxed to pharmacy, manually.  

## 2011-04-21 NOTE — Telephone Encounter (Signed)
Addended by: Derry Lory A on: 04/21/2011 10:39 AM   Modules accepted: Orders

## 2011-05-29 ENCOUNTER — Telehealth: Payer: Self-pay | Admitting: Family Medicine

## 2011-05-29 MED ORDER — AMLODIPINE BESYLATE 10 MG PO TABS: 10.0000 mg | ORAL_TABLET | Freq: Every day | ORAL | Status: DC

## 2011-05-29 NOTE — Telephone Encounter (Signed)
Refill- amlodipine 10mg  tab amne. Take one tablet by mouth at bedtime. Qty 30 last fill 10.2.12

## 2011-05-29 NOTE — Telephone Encounter (Signed)
rx sent to pharmacy by e-script  

## 2011-06-05 ENCOUNTER — Ambulatory Visit (INDEPENDENT_AMBULATORY_CARE_PROVIDER_SITE_OTHER): Payer: 59 | Admitting: Family Medicine

## 2011-06-05 ENCOUNTER — Encounter: Payer: Self-pay | Admitting: Family Medicine

## 2011-06-05 DIAGNOSIS — I10 Essential (primary) hypertension: Secondary | ICD-10-CM

## 2011-06-05 DIAGNOSIS — H6120 Impacted cerumen, unspecified ear: Secondary | ICD-10-CM

## 2011-06-05 DIAGNOSIS — E785 Hyperlipidemia, unspecified: Secondary | ICD-10-CM

## 2011-06-05 LAB — HEPATIC FUNCTION PANEL
ALT: 17 U/L (ref 0–35)
Albumin: 4.1 g/dL (ref 3.5–5.2)
Bilirubin, Direct: 0.1 mg/dL (ref 0.0–0.3)
Total Protein: 7.2 g/dL (ref 6.0–8.3)

## 2011-06-05 LAB — BASIC METABOLIC PANEL
BUN: 11 mg/dL (ref 6–23)
CO2: 28 mEq/L (ref 19–32)
Calcium: 9.3 mg/dL (ref 8.4–10.5)
Creatinine, Ser: 0.7 mg/dL (ref 0.4–1.2)
Glucose, Bld: 90 mg/dL (ref 70–99)

## 2011-06-05 LAB — LIPID PANEL
Cholesterol: 225 mg/dL — ABNORMAL HIGH (ref 0–200)
HDL: 46.2 mg/dL (ref >39.00)
Triglycerides: 207 mg/dL — ABNORMAL HIGH (ref 0.0–149.0)

## 2011-06-05 MED ORDER — ATORVASTATIN CALCIUM 20 MG PO TABS: 20.0000 mg | ORAL_TABLET | Freq: Every day | ORAL | Status: DC

## 2011-06-05 NOTE — Assessment & Plan Note (Signed)
Chronic problem.  Due for labs.  Tolerating statin w/out difficulty.  Check labs.  Adjust meds prn

## 2011-06-05 NOTE — Assessment & Plan Note (Signed)
Recurrent problem.  L ear curetted and then dry, hard ball of wax successfully irrigated from canal w/ resolution of muffled hearing.

## 2011-06-05 NOTE — Progress Notes (Signed)
  Subjective:    Patient ID: Beverly Black, female    DOB: Apr 07, 1929, 78 y.o.   MRN: [id number]  HPI HTN- chronic problem.  Excellent control on Norvasc, Lisinopril, Atenolol.  Denies CP, SOB, HAs, visual changes, edema.  Continues to smoke- not interested in quitting.  Hyperlipidemia- chronic problem.  Due for labs.  On Lipitor.  Denies abd pain, N/V, myalgias.  Hearing loss/cerumen impaction- ongoing problem for pt.  Currently having sxs in L ear.    Review of Systems For ROS see HPI     Objective:   Physical Exam  Vitals reviewed. Constitutional: She is oriented to person, place, and time. She appears well-developed and well-nourished. No distress.  HENT:  Head: Normocephalic and atraumatic.       R TM WNL L TM obscured by hard, dry cerumen- curetted away from canal wall and then successfully irrigated   Eyes: Conjunctivae and EOM are normal. Pupils are equal, round, and reactive to light.  Neck: Normal range of motion. Neck supple. No thyromegaly present.  Cardiovascular: Normal rate, regular rhythm, normal heart sounds and intact distal pulses.   No murmur heard. Pulmonary/Chest: Effort normal and breath sounds normal. No respiratory distress.  Abdominal: Soft. She exhibits no distension. There is no tenderness.  Musculoskeletal: She exhibits no edema.  Lymphadenopathy:    She has no cervical adenopathy.  Neurological: She is alert and oriented to person, place, and time.  Skin: Skin is warm and dry.  Psychiatric: She has a normal mood and affect. Her behavior is normal.          Assessment & Plan:

## 2011-06-05 NOTE — Assessment & Plan Note (Signed)
Chronic problem.  Stable.  BP well controlled.  Asymptomatic.  Tolerating med w/out difficulty.  No changes.

## 2011-06-05 NOTE — Patient Instructions (Signed)
Please schedule your complete physical in 6 months (30 minute visit)- do not eat before this appt We'll notify you of your lab results and make any med changes if needed Consider quitting smoking Call with any questions or concerns Happy New Year!

## 2011-06-23 ENCOUNTER — Telehealth: Payer: Self-pay | Admitting: Family Medicine

## 2011-06-23 NOTE — Telephone Encounter (Signed)
Last OV 06-05-11 last refill 04-21-11 #90 with 1 refill

## 2011-06-23 NOTE — Telephone Encounter (Signed)
Refill: Lorazepam .5 mg tab ACTA. Last fill 05-29-11

## 2011-06-25 NOTE — Telephone Encounter (Signed)
Ok for #90, 1 refill 

## 2011-06-26 MED ORDER — LORAZEPAM 0.5 MG PO TABS: 0.5000 mg | ORAL_TABLET | Freq: Three times a day (TID) | ORAL | Status: DC | PRN

## 2011-06-26 NOTE — Telephone Encounter (Signed)
.  rx faxed to pharmacy, manually.  

## 2011-07-25 ENCOUNTER — Telehealth: Payer: Self-pay | Admitting: Family Medicine

## 2011-07-25 NOTE — Telephone Encounter (Signed)
Refill for  Lorazepam 0.5mg  tab ranb  Last filled 2.20.13  Last qty in chart shows 90 Last description Take 1 tablet (0.5 mg total) by mouth 3 (three) times daily as needed.

## 2011-07-28 MED ORDER — LORAZEPAM 0.5 MG PO TABS: 0.5000 mg | ORAL_TABLET | Freq: Three times a day (TID) | ORAL | Status: DC | PRN

## 2011-07-28 NOTE — Telephone Encounter (Signed)
Rx sent 

## 2011-07-28 NOTE — Telephone Encounter (Signed)
Refill x1 

## 2011-07-28 NOTE — Telephone Encounter (Signed)
Last filled 06-26-11 #90 1, last OV 06-05-11

## 2011-08-24 ENCOUNTER — Telehealth: Payer: Self-pay | Admitting: Family Medicine

## 2011-08-24 NOTE — Telephone Encounter (Signed)
Refill: Lorazepam 0.5mg tab 

## 2011-08-24 NOTE — Telephone Encounter (Signed)
Ok for #90, 1 refill 

## 2011-08-24 NOTE — Telephone Encounter (Signed)
Last OV 06-05-11, last filled 07-28-11 #90

## 2011-08-25 MED ORDER — LORAZEPAM 0.5 MG PO TABS: 0.5000 mg | ORAL_TABLET | Freq: Three times a day (TID) | ORAL | Status: DC | PRN

## 2011-08-25 NOTE — Telephone Encounter (Signed)
.  rx faxed to pharmacy, manually.  

## 2011-09-29 ENCOUNTER — Telehealth: Payer: Self-pay | Admitting: Family Medicine

## 2011-09-29 MED ORDER — AMLODIPINE BESYLATE 10 MG PO TABS: 10.0000 mg | ORAL_TABLET | Freq: Every day | ORAL | Status: DC

## 2011-09-29 NOTE — Telephone Encounter (Signed)
Refill: Amlodipine besylate 10mg  tab. Take one tablet by mouth at bedtime. Qty 30. Last fill 07-09-11

## 2011-09-29 NOTE — Telephone Encounter (Signed)
rx sent to pharmacy by e-script  

## 2011-10-19 ENCOUNTER — Telehealth: Payer: Self-pay | Admitting: Family Medicine

## 2011-10-19 NOTE — Telephone Encounter (Signed)
Refill: Lorazepam 0.5mg  tablet. Take one tablet by mouth three times daily as needed. Last filled 09-24-11

## 2011-10-20 MED ORDER — LORAZEPAM 0.5 MG PO TABS: 0.5000 mg | ORAL_TABLET | Freq: Three times a day (TID) | ORAL | Status: DC | PRN

## 2011-10-20 NOTE — Telephone Encounter (Signed)
#  90 with 1 refills. Ok to refill?

## 2011-10-20 NOTE — Telephone Encounter (Signed)
Ok for #90, 3 refills 

## 2011-10-20 NOTE — Telephone Encounter (Signed)
Refill done.  

## 2011-10-23 ENCOUNTER — Other Ambulatory Visit: Payer: Self-pay | Admitting: Family Medicine

## 2011-10-23 NOTE — Telephone Encounter (Signed)
refill atenolol tabs 50mg  #180, take one tablet twice a day  Last wrt 10.8.12, last ov 1.28.13

## 2011-10-24 MED ORDER — ATENOLOL 50 MG PO TABS: 50.0000 mg | ORAL_TABLET | Freq: Two times a day (BID) | ORAL | Status: DC

## 2011-10-24 NOTE — Telephone Encounter (Signed)
rx sent to pharmacy by e-script for #180 no refills Letter has been mailed to pt address noted in the chart to advise they are overdue for cpe/ov/labs and the pt needs to contact office to set up appt  Pt was advised to set up CPE by 11-03-11

## 2011-10-31 ENCOUNTER — Other Ambulatory Visit: Payer: Self-pay | Admitting: Family Medicine

## 2011-10-31 MED ORDER — AMLODIPINE BESYLATE 10 MG PO TABS: 10.0000 mg | ORAL_TABLET | Freq: Every day | ORAL | Status: DC

## 2011-10-31 NOTE — Telephone Encounter (Signed)
rx sent to pharmacy by e-script for #90 no refills per noted pt was given discharge instructions for CPE in 6 months from last OV noted 06-05-11, called and spoke to pt to advise a CPE needs to be set-up AFTER 12-03-11 pt understood and will call back once she speaks to her daughter to find out her schedule, advised she does need to fast for this CPE, pt also clarified to send medication to [facility] even though mail order noted in chart, sent via escribe, pt understood all instructions

## 2011-10-31 NOTE — Telephone Encounter (Signed)
refill amlodipine besylate 10 mg tab #90, take one tablet by mouth at bedtime, last fill 06.24.13 Actually written 5.24.13 for qty 30 wt/1-refill, patient is requesting a 90-day supply  Please review and send new rx if approved Last ov 1.28.13

## 2011-11-08 ENCOUNTER — Telehealth: Payer: Self-pay | Admitting: Family Medicine

## 2011-11-08 MED ORDER — ESTROGENS CONJUGATED 0.3 MG PO TABS: 0.3000 mg | ORAL_TABLET | Freq: Every day | ORAL | Status: DC

## 2011-11-08 NOTE — Telephone Encounter (Signed)
Pts daughter states the pt is out of refills on her premarin and is not sure if she is supposed to be taking it or not. She states the neurologist said it was fine. Pt has been out of medication for 1 week and is already experiencing hot flashes. She would like to know if the pt should get a refill.

## 2011-11-08 NOTE — Telephone Encounter (Signed)
Called and spoke to pt to advise her refill has been sent and that MD Tabori gave verbal order to send pt the medication per still ok to take, pt understood, sent via escribe

## 2011-12-07 ENCOUNTER — Ambulatory Visit (INDEPENDENT_AMBULATORY_CARE_PROVIDER_SITE_OTHER): Payer: 59 | Admitting: Family Medicine

## 2011-12-07 ENCOUNTER — Encounter: Payer: Self-pay | Admitting: Family Medicine

## 2011-12-07 ENCOUNTER — Telehealth: Payer: Self-pay | Admitting: *Deleted

## 2011-12-07 VITALS — BP 120/74 | HR 75 | Temp 97.8°F | Ht 64.0 in | Wt 120.0 lb

## 2011-12-07 DIAGNOSIS — E785 Hyperlipidemia, unspecified: Secondary | ICD-10-CM

## 2011-12-07 DIAGNOSIS — Z0000 Encounter for general adult medical examination without abnormal findings: Secondary | ICD-10-CM

## 2011-12-07 DIAGNOSIS — Z1231 Encounter for screening mammogram for malignant neoplasm of breast: Secondary | ICD-10-CM

## 2011-12-07 DIAGNOSIS — Z780 Asymptomatic menopausal state: Secondary | ICD-10-CM

## 2011-12-07 DIAGNOSIS — I10 Essential (primary) hypertension: Secondary | ICD-10-CM

## 2011-12-07 LAB — TSH: TSH: 2.02 u[IU]/mL (ref 0.35–5.50)

## 2011-12-07 LAB — CBC WITH DIFFERENTIAL/PLATELET
Basophils Absolute: 0.1 10*3/uL (ref 0.0–0.1)
Basophils Relative: 0.7 % (ref 0.0–3.0)
Eosinophils Absolute: 0.4 10*3/uL (ref 0.0–0.7)
Eosinophils Relative: 5.1 % — ABNORMAL HIGH (ref 0.0–5.0)
HCT: 40 % (ref 36.0–46.0)
Hemoglobin: 13.5 g/dL (ref 12.0–15.0)
Lymphocytes Relative: 26.5 % (ref 12.0–46.0)
Monocytes Absolute: 0.5 10*3/uL (ref 0.1–1.0)
Monocytes Relative: 6.7 % (ref 3.0–12.0)
Neutro Abs: 4.9 10*3/uL (ref 1.4–7.7)
Platelets: 225 10*3/uL (ref 150.0–400.0)
WBC: 8 10*3/uL (ref 4.5–10.5)

## 2011-12-07 LAB — LIPID PANEL
Cholesterol: 240 mg/dL — ABNORMAL HIGH (ref 0–200)
HDL: 47.8 mg/dL (ref >39.00)
VLDL: 40 mg/dL (ref 0.0–40.0)

## 2011-12-07 LAB — BASIC METABOLIC PANEL
BUN: 11 mg/dL (ref 6–23)
Creatinine, Ser: 0.7 mg/dL (ref 0.4–1.2)
GFR: 86.81 mL/min (ref >60.00)
Potassium: 4 mEq/L (ref 3.5–5.1)

## 2011-12-07 LAB — LDL CHOLESTEROL, DIRECT: Direct LDL: 155.2 mg/dL

## 2011-12-07 LAB — HEPATIC FUNCTION PANEL
Bilirubin, Direct: 0.1 mg/dL (ref 0.0–0.3)
Total Bilirubin: 0.5 mg/dL (ref 0.3–1.2)

## 2011-12-07 NOTE — Telephone Encounter (Signed)
Pt asked questions concerning medications after her OV and MD Beverely Low has stepped into another room, pt noted concern of Lipitor commercials that noted may cause DM in women, pt notes she spoke to pharmacy rep that stated only in very slim cases, pt also advised that she discussed with MD Beverely Low about weening off the Premarin and that MD Tabori advised to consult with Neuro MD, pt advised that her neuro stated she can continue to take the Premarin if she wants to, pt notes she is still taking it however noted the recent refill instructions noted directions to take 21 days straight and off for 7 days, advised that I will speak with MD Tabori and give her a call back about concerns, pt stated "You can leave a message on my answering machine about what she thinks" pt then left office, spoke with MD tabori whom then advised that the study for lipitor was performed on obese pt's that were already at a higher risk of getting the DM due to poor eating habits thus other factors were noted to possibly cause the DM to onset, as well as the fact that the pt can continue the premarin if she so wishes to do so however noted in pt chart that the directions have been to take 21days straight and 7 off for the past year, pt may have not realized until now, called pt to advise, pt understood all directions and will start to take premarin 21 days straight and then hold for 7, MD tabori aware verbally of pt response

## 2011-12-07 NOTE — Patient Instructions (Addendum)
Follow up in 6 months to recheck blood pressure and cholesterol We'll call you with your mammo and bone density appts Please call Dermatology to review your skin- dark spot on back and skin tag on R breast Call with any questions or concerns Happy Early Birthday!!

## 2011-12-07 NOTE — Assessment & Plan Note (Signed)
Chronic problem.  Well controlled.  Asymptomatic.  No changes. 

## 2011-12-07 NOTE — Assessment & Plan Note (Signed)
Pt's CPE WNL w/ exception of known antalgic/shuffling gait and new skin abnormalities.  Pt has Derm- encouraged her to f/u.  Pt declines colonoscopy.  Will refer for mammo and DEXA.  EKG done- see document for interpretation.  Check labs.  Anticipatory guidance provided.

## 2011-12-07 NOTE — Progress Notes (Signed)
  Subjective:    Patient ID: Beverly Black, female    DOB: 1929/01/21, 78 y.o.   MRN: [id number]  HPI Here today for CPE. Neuro- Sethi Risk Factors: HTN- chronic problem, well controlled today.  On Amlodipine, atenolol, Lisinopril.  No CP, SOB, HAs, visual changes, edema, Hyperlipidemia- chronic problem, on Lipitor.  Due for labs.  No N/V, abd pain, myalgias Physical Activity: no formal exercise.  Limited activity Fall Risk: moderate risk, walks w/ stooped posture and ambulates w/ cane.  Admits to difficulty walking. Depression: chronic problem, on Remeron and Lorazepam Hearing: normal to conversational tones, mildly decreased to whispered voice ADL's: independent Cognitive: normal linear thought process, some memory impairment, good concentration Home Safety: lives alone but reports feeling safe.   Good family support Height, Weight, BMI, Visual Acuity: see vitals, vision corrected to 20/20 w/ glasses Counseling: pt declines colonoscopy, overdue for mammo/dexa, no need for paps due to hysterectomy Labs Ordered: See A&P Care Plan: See A&P    Review of Systems Patient reports no vision/ hearing changes, adenopathy,fever, weight change,  persistant/recurrent hoarseness , swallowing issues, chest pain, palpitations, edema, persistant/recurrent cough, hemoptysis, dyspnea (rest/exertional/paroxysmal nocturnal), gastrointestinal bleeding (melena, rectal bleeding), abdominal pain, significant heartburn, bowel changes, GU symptoms (dysuria, hematuria, incontinence), Gyn symptoms (abnormal  bleeding, pain),  syncope, focal weakness, memory loss, numbness & tingling, skin/hair/nail changes, abnormal bruising or bleeding.     Objective:   Physical Exam  General Appearance:    Alert, cooperative, no distress, appears stated age  Head:    Normocephalic, without obvious abnormality, atraumatic  Eyes:    PERRL, conjunctiva/corneas clear, EOM's intact, fundi    benign, both eyes  Ears:    Normal TM's  and external ear canals, both ears  Nose:   Nares normal, septum midline, mucosa normal, no drainage    or sinus tenderness  Throat:   Lips, mucosa, and tongue normal; teeth and gums normal  Neck:   Supple, symmetrical, trachea midline, no adenopathy;    Thyroid: no enlargement/tenderness/nodules  Back:     Symmetric, + kyphosis, ROM normal, no CVA tenderness  Lungs:     Clear to auscultation bilaterally, respirations unlabored  Chest Wall:    No tenderness or deformity   Heart:    Regular rate and rhythm, S1 and S2 normal, no murmur, rub   or gallop  Breast Exam:    No tenderness, masses, or nipple abnormality  Abdomen:     Soft, non-tender, bowel sounds active all four quadrants,    no masses, no organomegaly  Genitalia:    Deferred at pt request  Rectal:    Extremities:   Extremities normal, atraumatic, no cyanosis or edema  Pulses:   2+ and symmetric all extremities  Skin:   Skin color, texture, turgor normal, large skin tag on R breast that appears to have keratotic features (almost appears fungating) and hyperpigmented atypical nevus on L shoulder  Lymph nodes:   Cervical, supraclavicular, and axillary nodes normal  Neurologic:   CNII-XII intact, decreased leg strength, antalgic/shuffling gait, sensation and reflexes    throughout          Assessment & Plan:

## 2011-12-07 NOTE — Assessment & Plan Note (Signed)
Chronic problem.  Tolerating lipitor w/out difficulty.  Check labs- adjust meds prn.

## 2011-12-19 MED ORDER — ATORVASTATIN CALCIUM 40 MG PO TABS: 40.0000 mg | ORAL_TABLET | Freq: Every day | ORAL | Status: DC

## 2011-12-19 NOTE — Addendum Note (Signed)
Addended by: Derry Lory A on: 12/19/2011 11:10 AM   Modules accepted: Orders

## 2011-12-25 ENCOUNTER — Ambulatory Visit: Payer: 59

## 2011-12-25 ENCOUNTER — Other Ambulatory Visit: Payer: 59

## 2012-01-01 ENCOUNTER — Other Ambulatory Visit: Payer: Self-pay | Admitting: Family Medicine

## 2012-01-01 NOTE — Telephone Encounter (Signed)
rx sent to pharmacy by e-script  

## 2012-01-19 ENCOUNTER — Telehealth: Payer: Self-pay | Admitting: Family Medicine

## 2012-01-19 MED ORDER — ATENOLOL 50 MG PO TABS: 50.0000 mg | ORAL_TABLET | Freq: Two times a day (BID) | ORAL | Status: DC

## 2012-01-19 MED ORDER — TRAMADOL HCL 50 MG PO TABS: 50.0000 mg | ORAL_TABLET | Freq: Three times a day (TID) | ORAL | Status: DC | PRN

## 2012-01-19 NOTE — Telephone Encounter (Signed)
Last OV 12-07-11 last refill 12-23-2010 #90 with 1 refill

## 2012-01-19 NOTE — Telephone Encounter (Signed)
Ok for 90

## 2012-01-19 NOTE — Telephone Encounter (Signed)
Refill atenolol tabs 50mg  #180 take one tablet twice a day  [facility]

## 2012-01-19 NOTE — Telephone Encounter (Signed)
Refill: Tramadol hcl 50mg  tab. Take one tablet by mouth three times daily. Qty 90. Last fill 10-17-11

## 2012-01-19 NOTE — Telephone Encounter (Signed)
Refill done.  

## 2012-01-26 ENCOUNTER — Ambulatory Visit
Admission: RE | Admit: 2012-01-26 | Discharge: 2012-01-26 | Disposition: A | Discharge status: Home / Self Care | Payer: 59 | Source: Ambulatory Visit | Attending: Family Medicine | Admitting: Family Medicine

## 2012-01-26 DIAGNOSIS — Z1231 Encounter for screening mammogram for malignant neoplasm of breast: Secondary | ICD-10-CM

## 2012-01-26 DIAGNOSIS — Z780 Asymptomatic menopausal state: Secondary | ICD-10-CM

## 2012-01-29 ENCOUNTER — Telehealth: Payer: Self-pay | Admitting: Family Medicine

## 2012-01-29 MED ORDER — AMLODIPINE BESYLATE 10 MG PO TABS: 10.0000 mg | ORAL_TABLET | Freq: Every day | ORAL | Status: DC

## 2012-01-29 NOTE — Telephone Encounter (Signed)
Refill AmLODIPine Besylate (Tab) NORVASC 10 MG Take 1 tablet (10 mg total) by mouth at bedtime #90, last fill 6.26.13 Last ov V70 8.1.13

## 2012-01-29 NOTE — Telephone Encounter (Signed)
Refill done.  

## 2012-01-30 ENCOUNTER — Other Ambulatory Visit: Payer: Self-pay | Admitting: Family Medicine

## 2012-01-30 ENCOUNTER — Telehealth: Payer: Self-pay

## 2012-01-30 DIAGNOSIS — R928 Other abnormal and inconclusive findings on diagnostic imaging of breast: Secondary | ICD-10-CM

## 2012-01-30 MED ORDER — ALENDRONATE SODIUM 70 MG PO TABS: 70.0000 mg | ORAL_TABLET | ORAL | Status: DC

## 2012-01-30 MED ORDER — EZETIMIBE 10 MG PO TABS: 10.0000 mg | ORAL_TABLET | Freq: Every day | ORAL | Status: DC

## 2012-01-30 NOTE — Telephone Encounter (Signed)
Spoke to pt to advise results/instructions. Pt understood. Pt advised that she will try the zetia 10mg , reiterated the zetia is NOT generic, pt understood and new rx sent to pharmacy noted by pt

## 2012-01-30 NOTE — Telephone Encounter (Signed)
She can switch to Zetia 10mg  but this is not generic the way lipitor is

## 2012-01-30 NOTE — Telephone Encounter (Signed)
Called pt to inform her of her Dexa scan results and she mentioned that since starting the Lipitor 40mg  she has had increased muscle aches and does not really like taking the lipitor because she has heard so many bad things and wants to know if there is something else she can try.

## 2012-02-01 ENCOUNTER — Ambulatory Visit
Admission: RE | Admit: 2012-02-01 | Discharge: 2012-02-01 | Disposition: A | Discharge status: Home / Self Care | Payer: Self-pay | Source: Ambulatory Visit | Attending: Family Medicine | Admitting: Family Medicine

## 2012-02-01 DIAGNOSIS — R928 Other abnormal and inconclusive findings on diagnostic imaging of breast: Secondary | ICD-10-CM

## 2012-02-09 ENCOUNTER — Encounter: Payer: Self-pay | Admitting: Family Medicine

## 2012-02-15 ENCOUNTER — Other Ambulatory Visit: Payer: Self-pay | Admitting: Family Medicine

## 2012-02-15 MED ORDER — LORAZEPAM 0.5 MG PO TABS: 0.5000 mg | ORAL_TABLET | Freq: Three times a day (TID) | ORAL | Status: DC | PRN

## 2012-02-15 NOTE — Telephone Encounter (Signed)
Refill LORazepam (Tab) 0.5 MG Take 1 tablet (0.5 mg total) by mouth 3 (three) times daily as needed. -- last fill 9.17.13, last ov 8.1.13 V70

## 2012-02-15 NOTE — Telephone Encounter (Signed)
.  rx faxed to pharmacy, manually. Per MD Beverely Low gave verbal to send based on recent CPE and last refill of #90 with 3 refills sent on 6-13,

## 2012-03-01 ENCOUNTER — Other Ambulatory Visit: Payer: Self-pay | Admitting: Family Medicine

## 2012-03-01 MED ORDER — LISINOPRIL 20 MG PO TABS: 20.0000 mg | ORAL_TABLET | Freq: Every day | ORAL | Status: DC

## 2012-03-01 NOTE — Telephone Encounter (Signed)
rx sent to pharmacy by e-script  

## 2012-03-01 NOTE — Telephone Encounter (Signed)
refill Lisinopril (Tab)  20 MG Take 20 mg by mouth daily #90-wt/1-refills --last ov 8.1.13-V70** does not show wrt. by tabori on current meds listing

## 2012-04-15 ENCOUNTER — Other Ambulatory Visit: Payer: Self-pay | Admitting: *Deleted

## 2012-04-15 MED ORDER — ATENOLOL 50 MG PO TABS: 50.0000 mg | ORAL_TABLET | Freq: Two times a day (BID) | ORAL | Status: DC

## 2012-04-15 NOTE — Telephone Encounter (Signed)
Rx refilled sent//AB/CMA 

## 2012-04-25 ENCOUNTER — Telehealth: Payer: Self-pay | Admitting: Family Medicine

## 2012-04-25 NOTE — Telephone Encounter (Signed)
Called pt, she states that she requested a refill for tramadol but was denied. I do not see anything in pt's chart stating that we received a refill request for tramadol. OK to refill?

## 2012-04-25 NOTE — Telephone Encounter (Signed)
Patient states she has a question about her Tramadol prescription. CB# [phone]

## 2012-04-25 NOTE — Telephone Encounter (Signed)
Spoke with the pt and informed her that we have not filled her Tramadol since Aug. 2012, and we have not received any RF request for it .   Asked the pt why she was taking the Tramadol, and she said for pain in legs and hips.  Pt said that she is only taking 1 daily, so she want r/o.   Asked the pt who was refilling it and she said we are.   Informed the pt that she could call the pharmacy and ask them to fax another RF request.   Pt agreed.//AB/CMA

## 2012-04-25 NOTE — Telephone Encounter (Signed)
Please call and ask why pt is taking this med as it has not been filled since August of 2012.  We have not received a refill request, nor denied her script- i just need to know why she's taking it.

## 2012-04-29 ENCOUNTER — Encounter: Payer: Self-pay | Admitting: *Deleted

## 2012-04-29 ENCOUNTER — Other Ambulatory Visit: Payer: Self-pay | Admitting: Family Medicine

## 2012-04-29 MED ORDER — TRAMADOL HCL 50 MG PO TABS: 50.0000 mg | ORAL_TABLET | Freq: Three times a day (TID) | ORAL | Status: DC | PRN

## 2012-04-29 NOTE — Telephone Encounter (Signed)
Ok for #90, 1 refill 

## 2012-04-29 NOTE — Telephone Encounter (Signed)
refill TraMADol HCl (Tab) 50 MG Take 1 tablet (50 mg total) by mouth 3 (three) times daily as needed. #90 last fill 12.20.13--per fax recv from [facility]

## 2012-04-29 NOTE — Telephone Encounter (Signed)
Tried to call Pt to but line was busy to advise Pt that she will need to come in to office to sign agreement and pick up Rx

## 2012-04-29 NOTE — Telephone Encounter (Signed)
Last OV 12-07-11 , last filled 12-23-10 #90 1

## 2012-04-30 NOTE — Telephone Encounter (Signed)
Patient returned phone call and I informed her of prescription refill and that she needs to come pick up Rx and sign agreement.

## 2012-04-30 NOTE — Telephone Encounter (Signed)
noted 

## 2012-06-04 ENCOUNTER — Other Ambulatory Visit: Payer: Self-pay | Admitting: Family Medicine

## 2012-06-04 MED ORDER — EZETIMIBE 10 MG PO TABS: 10.0000 mg | ORAL_TABLET | Freq: Every day | ORAL | Status: DC

## 2012-06-04 NOTE — Telephone Encounter (Signed)
Rx sent to the pharmacy by e-script.//AB/CMA 

## 2012-06-04 NOTE — Telephone Encounter (Signed)
refill ZETIA 10 MG Take 1 tablet (10 mg total) by mouth daily #30 last fill 12.20.13

## 2012-06-18 ENCOUNTER — Telehealth: Payer: Self-pay | Admitting: Family Medicine

## 2012-06-18 MED ORDER — LORAZEPAM 0.5 MG PO TABS: 0.5000 mg | ORAL_TABLET | Freq: Three times a day (TID) | ORAL | Status: DC | PRN

## 2012-06-18 NOTE — Telephone Encounter (Signed)
Discuss with patient, Rx sent. 

## 2012-06-18 NOTE — Telephone Encounter (Signed)
pt stated she called in Lorazepam on Monday and the pharmacy told her today that Dr.Tabori denied her refills. I did advised pt we have not received a request from the pharmacy since 10.10.13--pt would like refills for Lorazepam, can send to [facility] on [address] [place]

## 2012-06-18 NOTE — Telephone Encounter (Signed)
Ok for #90, 3 refills 

## 2012-07-04 ENCOUNTER — Telehealth: Payer: Self-pay | Admitting: Family Medicine

## 2012-07-04 NOTE — Telephone Encounter (Signed)
refill  TraMADol HCl (Tab) 50 MG Take 1 tablet (50 mg total) by mouth 3 (three) times daily as needed. #90 last fill 12.24.13

## 2012-07-05 NOTE — Telephone Encounter (Signed)
Ok to refill? Last OV 8.1.13 Last filled 12.23.13

## 2012-07-05 NOTE — Telephone Encounter (Signed)
Refill: Premarin 0.3mg tabs. Take 1 tablet by mouth every day. Qty 30. Last fill 06-07-12 °

## 2012-07-07 NOTE — Telephone Encounter (Signed)
Ok for #90, no refills.  Overdue on 6 month f/u- needs to schedule

## 2012-07-08 NOTE — Telephone Encounter (Signed)
Refill: Premarin 0.3mg  tabs. Take 1 tablet by mouth every day. Qty 30. Last fill 06-07-12

## 2012-07-09 MED ORDER — TRAMADOL HCL 50 MG PO TABS: 50.0000 mg | ORAL_TABLET | Freq: Three times a day (TID) | ORAL | Status: DC | PRN

## 2012-07-09 MED ORDER — ESTROGENS CONJUGATED 0.3 MG PO TABS: 0.3000 mg | ORAL_TABLET | Freq: Every day | ORAL | Status: DC

## 2012-07-09 NOTE — Telephone Encounter (Signed)
Rx's sent to the pharmacy([facility] [address]) by e-script.  Spoke with the pt and informed her the rx's have been sent and she will need to schedule an appt.  Pt stated that she will have her daughter call and schedule an appt.//AB/CMA

## 2012-07-15 ENCOUNTER — Encounter: Payer: Self-pay | Admitting: Family Medicine

## 2012-07-15 ENCOUNTER — Ambulatory Visit (INDEPENDENT_AMBULATORY_CARE_PROVIDER_SITE_OTHER): Payer: Medicare Other | Admitting: Family Medicine

## 2012-07-15 VITALS — BP 130/70 | HR 57 | Temp 97.5°F

## 2012-07-15 DIAGNOSIS — E785 Hyperlipidemia, unspecified: Secondary | ICD-10-CM

## 2012-07-15 LAB — HEPATIC FUNCTION PANEL
ALT: 17 U/L (ref 0–35)
AST: 22 U/L (ref 0–37)
Albumin: 3.7 g/dL (ref 3.5–5.2)
Alkaline Phosphatase: 51 U/L (ref 39–117)
Bilirubin, Direct: 0 mg/dL (ref 0.0–0.3)
Total Bilirubin: 0.5 mg/dL (ref 0.3–1.2)
Total Protein: 6.6 g/dL (ref 6.0–8.3)

## 2012-07-15 LAB — LIPID PANEL
Cholesterol: 270 mg/dL — ABNORMAL HIGH (ref 0–200)
HDL: 41.9 mg/dL (ref >39.00)
Triglycerides: 171 mg/dL — ABNORMAL HIGH (ref 0.0–149.0)

## 2012-07-15 LAB — BASIC METABOLIC PANEL
BUN: 16 mg/dL (ref 6–23)
CO2: 26 mEq/L (ref 19–32)
Calcium: 9.2 mg/dL (ref 8.4–10.5)
Chloride: 102 mEq/L (ref 96–112)
Creatinine, Ser: 0.6 mg/dL (ref 0.4–1.2)

## 2012-07-15 NOTE — Patient Instructions (Addendum)
Follow up in 2 months to see how you are doing w/ your physical therapy Someone will contact you about coming to the house for therapy We'll notify you of your lab results Continue the Allegra- if the allergy symptoms aren't improving try switching to claritin or zyrtec Call with any questions or concerns Hang in there!

## 2012-07-15 NOTE — Progress Notes (Signed)
  Subjective:    Patient ID: Beverly Black, female    DOB: 07-25-1928, 79 y.o.   MRN: [id number]  HPI Falls- pt and daughter report multiple instances of falls this weekend, culminating in a fall in our lobby this AM when attempting to sit in chair.  Pt reports 'chair went back and I went down' on R knee.  Did not hit head.  Knee is scraped w/ some bleeding.  Also fell in parking lot over the weekend while stepping in the slush.  Again did not sustain injury.  Was staying w/ daughter over the weekend due to loss of power and she 'went down' in the hallway and had a difficult time getting up.  Pt typically lives alone and does not drive.  Ambulates w/ a cane but pt and family report that she more carries cane than actually uses it.  S/p CVA w/ some residual R sided weakness.  HTN- chronic problem, on Amlodipine, Atenolol, Lisinopril.  No CP, SOB, HAs, visual changes, edema.  Hyperlipidemia- chronic problem, no longer on Lipitor due to concerns about side effects.  Now on Zetia.  Denies abd pain, N/V, myalgias  Seasonal allergies- currently on allegra.  Still having eye sxs and HA.  Decreased hearing due to excessive wax production  Osteoporosis- never started Fosamax, wasn't sure how to take this medicine so never started.   Review of Systems For ROS see HPI     Objective:   Physical Exam  Vitals reviewed. Constitutional: She is oriented to person, place, and time. She appears well-developed and well-nourished. No distress.  HENT:  Head: Normocephalic and atraumatic.  Bilateral cerumen impaction- wax is dry, deep, unable to reach safely w/ curette despite attempts  Eyes: Conjunctivae and EOM are normal. Pupils are equal, round, and reactive to light.  Neck: Normal range of motion. Neck supple. No thyromegaly present.  Cardiovascular: Normal rate, regular rhythm, normal heart sounds and intact distal pulses.   No murmur heard. Pulmonary/Chest: Effort normal and breath sounds normal. No  respiratory distress.  Abdominal: Soft. She exhibits no distension. There is no tenderness.  Musculoskeletal: She exhibits no edema.  Lymphadenopathy:    She has no cervical adenopathy.  Neurological: She is alert and oriented to person, place, and time. No cranial nerve deficit.  Skin: Skin is warm and dry.  R anterior knee abrasion w/ scant bleeding  Psychiatric: She has a normal mood and affect. Her behavior is normal.          Assessment & Plan:

## 2012-07-16 DIAGNOSIS — M810 Age-related osteoporosis without current pathological fracture: Secondary | ICD-10-CM | POA: Insufficient documentation

## 2012-07-16 MED ORDER — ALENDRONATE SODIUM 70 MG PO TABS: 70.0000 mg | ORAL_TABLET | ORAL | Status: DC

## 2012-07-16 MED ORDER — LISINOPRIL 20 MG PO TABS: 20.0000 mg | ORAL_TABLET | Freq: Every day | ORAL | Status: DC

## 2012-07-16 MED ORDER — ATENOLOL 50 MG PO TABS: 50.0000 mg | ORAL_TABLET | Freq: Two times a day (BID) | ORAL | Status: DC

## 2012-07-16 MED ORDER — ATORVASTATIN CALCIUM 40 MG PO TABS: 40.0000 mg | ORAL_TABLET | Freq: Every day | ORAL | Status: DC

## 2012-07-16 MED ORDER — AMLODIPINE BESYLATE 10 MG PO TABS: 10.0000 mg | ORAL_TABLET | Freq: Every day | ORAL | Status: DC

## 2012-07-16 MED ORDER — EZETIMIBE 10 MG PO TABS: 10.0000 mg | ORAL_TABLET | Freq: Every day | ORAL | Status: DC

## 2012-07-16 NOTE — Assessment & Plan Note (Signed)
Chronic problem, tolerating Zetia w/out difficulty but stopped Lipitor due to fear of side effects.  Check labs and restart statin prn.  Due to hx of CVA, pt's lipid goal is < 100 and closer to 70.

## 2012-07-16 NOTE — Assessment & Plan Note (Signed)
New.  Reviewed appropriate use of Fosamax.  Script sent to mail order.

## 2012-07-16 NOTE — Assessment & Plan Note (Signed)
After unsuccessful attempts at curetting, ears successfully irrigated.

## 2012-07-16 NOTE — Assessment & Plan Note (Signed)
New.  Suspect this is due to deconditioning along w/ CVA residuals.  Will refer to PT for pt to have strength, balance and endurance training.  Knee abrasion cleaned w/ H2O2 and dressed w/ bandaid.

## 2012-07-16 NOTE — Assessment & Plan Note (Signed)
Chronic problem, adequate control.  Asymptomatic.  No changes. 

## 2012-07-18 ENCOUNTER — Telehealth: Payer: Self-pay | Admitting: Family Medicine

## 2012-07-18 NOTE — Telephone Encounter (Signed)
Patient has questions about her medication. She is not sure if she is supposed to be taking Zetia and Lipitor or just one of them. Please advise.

## 2012-07-18 NOTE — Telephone Encounter (Signed)
Message copied by Verdie Shire on Thu Jul 18, 2012  4:43 PM ------      Message from: Sheliah Hatch      Created: Tue Jul 16, 2012  4:30 PM       Yes, both Lipitor and Zetia for now.  Ok to resend script for 6 months ------

## 2012-07-18 NOTE — Telephone Encounter (Signed)
Spoke with the pt and informed her that she needs to take both the Lipitor and Zetia, and told her she can take them together at night.  Pt understood and agreed.//AB/CMA

## 2012-08-02 ENCOUNTER — Other Ambulatory Visit: Payer: Self-pay | Admitting: *Deleted

## 2012-08-02 MED ORDER — ESTROGENS CONJUGATED 0.3 MG PO TABS: 0.3000 mg | ORAL_TABLET | Freq: Every day | ORAL | Status: DC

## 2012-08-02 NOTE — Telephone Encounter (Signed)
Refill request sent to the pharmacy by e-script.//AB/CMA

## 2012-09-04 ENCOUNTER — Other Ambulatory Visit: Payer: Self-pay | Admitting: Family Medicine

## 2012-09-05 ENCOUNTER — Ambulatory Visit (INDEPENDENT_AMBULATORY_CARE_PROVIDER_SITE_OTHER): Payer: 59 | Admitting: Neurology

## 2012-09-05 ENCOUNTER — Encounter: Payer: Self-pay | Admitting: Neurology

## 2012-09-05 DIAGNOSIS — IMO0001 Reserved for inherently not codable concepts without codable children: Secondary | ICD-10-CM

## 2012-09-05 DIAGNOSIS — I6529 Occlusion and stenosis of unspecified carotid artery: Secondary | ICD-10-CM

## 2012-09-05 DIAGNOSIS — M791 Myalgia, unspecified site: Secondary | ICD-10-CM

## 2012-09-05 DIAGNOSIS — I10 Essential (primary) hypertension: Secondary | ICD-10-CM | POA: Insufficient documentation

## 2012-09-05 DIAGNOSIS — R42 Dizziness and giddiness: Secondary | ICD-10-CM | POA: Insufficient documentation

## 2012-09-05 NOTE — Patient Instructions (Addendum)
Continue aspirin for stroke prevention and strict control of hypertension with blood pressure goal below 130/90 and lipids his LDL cholesterol goal below 100 mg percent. Check followup carotid ultrasound study. I hve counseled the patient about gait and balance and advised her to get up slowly to avoid falling down. Return for followup in 6 months with Lynn,NP.

## 2012-09-07 NOTE — Progress Notes (Signed)
[facility] [address] [place]. [place] [address] [phone]       OFFICE FOLLOW-UP NOTE  [name] Date of Birth:  1929-03-02 Medical Record Number:  [id number]   HPI: 79 year old lady with a left thalamic and corona radiata stroke in [place] 2010 from small vessel disease. Vascular risk factors of hypertension, hyperlipidemia and age. She returns for followup after last visit on 01/09/12. She continues to do well without recurrence stroke or TIA symptoms. She is tolerating aspirin well without bleeding, bruising and states her last lipid profile checked by Dr. Lawson Black showed elevated cholesterol and she added Lipitor 40 mg and now has a followup appointment in a few weeks to recheck it. She continues to have poor balance and frequent falls and fell a few times recently. Once she fell on the ice and snow and on other occasions she tripped on the carpet. She fell recently in doctor's office a few weeks ago. She got home physical therapy for 3 weeks. She states blood pressure at home is well controlled though it is slightly elevated at 150/60 in the office today. She does have mild post stroke parkinsonian symptoms in the form of bradykinesia, poor balance and frequent falls but denies any resting or action tremors or drooling of saliva   ROS:   14 system review of systems is positive for joint pain, aching muscles, constipation, weakness, anxiety and not enough sleep.  PMH:  Past Medical History  Diagnosis Date  . Hypertension   . Hyperlipidemia   . Stroke 10/2008  . Aneurysm     of the eye    Social History:  History   Social History  . Marital Status: Widowed    Spouse Name: N/A    Number of Children: N/A  . Years of Education: N/A   Occupational History  . Not on file.   Social History Main Topics  . Smoking status: Current Every Day Smoker -- 0.50 packs/day  . Smokeless tobacco: Not on file  . Alcohol Use: No  . Drug Use: No  . Sexually Active:     Other Topics Concern  . Not on file   Social History Narrative  . No narrative on file    Medications:   Current Outpatient Prescriptions on File Prior to Visit  Medication Sig Dispense Refill  . alendronate (FOSAMAX) 70 MG tablet Take 1 tablet (70 mg total) by mouth every 7 (seven) days. Take with a full glass of water on an empty stomach.  12 tablet  3  . amLODipine (NORVASC) 10 MG tablet Take 1 tablet (10 mg total) by mouth at bedtime.  90 tablet  3  . aspirin 81 MG tablet Take 81 mg by mouth daily.        [name] atenolol (TENORMIN) 50 MG tablet Take 1 tablet (50 mg total) by mouth 2 (two) times daily.  180 tablet  3  . atorvastatin (LIPITOR) 40 MG tablet Take 1 tablet (40 mg total) by mouth daily.  90 tablet  3  . estrogens, conjugated, (PREMARIN) 0.3 MG tablet Take 1 tablet (0.3 mg total) by mouth daily. Take daily for 21 days then do not take for 7 days.  90 tablet  4  . ezetimibe (ZETIA) 10 MG tablet Take 1 tablet (10 mg total) by mouth daily.  90 tablet  3  . lisinopril (PRINIVIL,ZESTRIL) 20 MG tablet Take 1 tablet (20 mg total) by mouth daily.  90 tablet  3  . LORazepam (  ATIVAN) 0.5 MG tablet Take 1 tablet (0.5 mg total) by mouth 3 (three) times daily as needed.  90 tablet  3  . mirtazapine (REMERON) 30 MG tablet TAKE 1 TABLET AT BEDTIME  90 tablet  1  . traMADol (ULTRAM) 50 MG tablet Take 1 tablet (50 mg total) by mouth 3 (three) times daily as needed. Pt need to schedule an OV.  90 tablet  0   No current facility-administered medications on file prior to visit.    Allergies:   Allergies  Allergen Reactions  . Sulfa Antibiotics     Cant remember reaction   Filed Vitals:   09/05/12 1626  BP: 150/60     Physical Exam General: well developed, well nourished, seated, in no evident distress Head: head normocephalic and atraumatic. Orohparynx benign Neck: supple with no carotid or supraclavicular bruits Cardiovascular: regular rate and rhythm, no murmurs Musculoskeletal:  no deformity. Mild kyphosis Skin:  no rash/petichiae Vascular:  Normal pulses all extremities  Neurologic Exam Mental Status: Awake and fully alert. Oriented to place and time. Recent and remote memory intact. Diminished recall 1/3. Attention span, concentration and fund of knowledge appropriate. Mood and affect appropriate. Positive glabellar tap. Palmomental reflex present on the right but absent on the left. Cranial Nerves: Fundoscopic exam reveals sharp disc margins. Pupils equal, briskly reactive to light. Extraocular movements full without nystagmus. Visual fields full to confrontation. Hearing intact. Facial sensation intact. Face, tongue, palate moves normally and symmetrically.  Motor: Normal bulk and tone. Normal strength in all tested extremity muscles. No resting or action tremor. Mild cogwheel rigidity in both wrists. Slight diminished arm sling when she walks. Sensory.: intact to tough and pinprick and vibratory.  Coordination: Rapid alternating movements normal in all extremities. Finger-to-nose and heel-to-shin performed accurately bilaterally. Gait and Station: Arises from chair without difficulty. Stance is normal. Gait demonstrates diminished stride length and  Poor balance with mild festination. Retropulsion present. Slight apraxia when she turns [place] sits down. . Unable to heel, toe and tandem walk without difficulty.  Reflexes: 1+ and symmetric. Toes downgoing.     ASSESSMENT: 79 year old lady with a left thalamic and corona radiata stroke in [place] 2010 from small vessel disease. Vascular risk factors of hypertension, hyperlipidemia and age. Mild post stroke parkinsonism.    PLAN: Continue aspirin for stroke prevention and strict control of hypertension with blood pressure goal below 130/90 and lipids his LDL cholesterol goal below 100 mg percent. Check followup carotid ultrasound study. I hve counseled the patient about gait and balance and advised her to get up slowly to  avoid falling down.

## 2012-09-16 ENCOUNTER — Ambulatory Visit: Payer: Self-pay | Admitting: Family Medicine

## 2012-09-19 ENCOUNTER — Ambulatory Visit: Payer: Self-pay | Admitting: Family Medicine

## 2012-09-23 ENCOUNTER — Telehealth: Payer: Self-pay | Admitting: Neurology

## 2012-09-23 NOTE — Telephone Encounter (Signed)
Patients daughter is returning call to schedule US Carotid Duplex Bilateral.

## 2012-09-24 NOTE — Telephone Encounter (Signed)
Called dtr. [name] Richards for scheduling dopplers.  Sf  

## 2012-09-26 ENCOUNTER — Encounter: Payer: Self-pay | Admitting: Family Medicine

## 2012-09-26 ENCOUNTER — Ambulatory Visit (INDEPENDENT_AMBULATORY_CARE_PROVIDER_SITE_OTHER): Payer: Medicare Other | Admitting: Family Medicine

## 2012-09-26 VITALS — BP 140/70 | HR 63 | Temp 97.7°F | Ht 64.25 in | Wt 120.4 lb

## 2012-09-26 DIAGNOSIS — R296 Repeated falls: Secondary | ICD-10-CM

## 2012-09-26 DIAGNOSIS — I10 Essential (primary) hypertension: Secondary | ICD-10-CM

## 2012-09-26 DIAGNOSIS — Z9181 History of falling: Secondary | ICD-10-CM

## 2012-09-26 MED ORDER — LORAZEPAM 0.5 MG PO TABS: 0.5000 mg | ORAL_TABLET | Freq: Three times a day (TID) | ORAL | Status: DC | PRN

## 2012-09-26 NOTE — Patient Instructions (Addendum)
We'll contact Home Health and have them come out and see what you need at home We'll start the paperwork for the rolling walker rather than the cane Mention the ankle weakness to the neurologist- she may need an orthotic Please get a Life Alert system Call with any questions or concerns Hang in there!!!

## 2012-09-26 NOTE — Progress Notes (Signed)
  Subjective:    Patient ID: Beverly Black, female    DOB: [birth year], 79 y.o.   MRN: [id number]  HPI Frequent falls- pt had 6 visits from [facility] PT.  Pt doesn't think this was helpful.  Pt continues to carry the cane more often than using it.  Larey Seat again in April when going to the rest room- landed on butt and back.  Had difficulty getting up off the floor.  Continues to have R leg discomfort from fall.  Pain is located in R buttock and outer thigh.  Has R sided residuals from CVA.  Saw Neuro 5/1- was told she walks too fast and was told to change positions slowly.   Review of Systems For ROS see HPI     Objective:   Physical Exam  Vitals reviewed. Constitutional: She is oriented to person, place, and time. She appears well-developed and well-nourished. No distress.  HENT:  Head: Normocephalic and atraumatic.  Eyes: Conjunctivae and EOM are normal. Pupils are equal, round, and reactive to light.  Neck: Normal range of motion. Neck supple. No thyromegaly present.  Cardiovascular: Normal rate, regular rhythm, normal heart sounds and intact distal pulses.   No murmur heard. Pulmonary/Chest: Effort normal and breath sounds normal. No respiratory distress.  Abdominal: Soft. She exhibits no distension. There is no tenderness.  Musculoskeletal: She exhibits no edema.  Lymphadenopathy:    She has no cervical adenopathy.  Neurological: She is alert and oriented to person, place, and time. Coordination (shuffling gait) abnormal.  Skin: Skin is warm and dry.  Psychiatric: She has a normal mood and affect. Her behavior is normal.          Assessment & Plan:

## 2012-10-01 NOTE — Assessment & Plan Note (Signed)
Ongoing issue for pt.  This visit proved to be very frustrating as pt and daughter are here expressing their concerns over frequent falls but are then unwilling to discuss/comply w/ ideas for improved pt safety.  Not willing to look into assisted living at this time.  Discussed need for life alert since pt lives alone.  Also discussed switching cane (which pt does not use properly) for a rolling walker w/ seat.  Pt agreeable to this.  Will start paperwork.

## 2012-10-01 NOTE — Assessment & Plan Note (Signed)
Deteriorated.  BP elevated today.  Asymptomatic.  Improves as visit continues.  Suspect this is due to pt's anxiety- she does not have many social interactions and leaving the house is stressful to her.  Will follow.

## 2012-10-03 ENCOUNTER — Ambulatory Visit (INDEPENDENT_AMBULATORY_CARE_PROVIDER_SITE_OTHER): Payer: 59

## 2012-10-03 DIAGNOSIS — I6529 Occlusion and stenosis of unspecified carotid artery: Secondary | ICD-10-CM

## 2012-10-08 ENCOUNTER — Telehealth: Payer: Self-pay | Admitting: *Deleted

## 2012-10-08 NOTE — Telephone Encounter (Signed)
Error//AB/CMA 

## 2012-10-16 DIAGNOSIS — R269 Unspecified abnormalities of gait and mobility: Secondary | ICD-10-CM

## 2012-10-16 DIAGNOSIS — Z9181 History of falling: Secondary | ICD-10-CM

## 2012-10-16 DIAGNOSIS — I10 Essential (primary) hypertension: Secondary | ICD-10-CM

## 2012-10-16 DIAGNOSIS — I69959 Hemiplegia and hemiparesis following unspecified cerebrovascular disease affecting unspecified side: Secondary | ICD-10-CM

## 2012-10-22 ENCOUNTER — Other Ambulatory Visit: Payer: Self-pay | Admitting: Family Medicine

## 2012-10-23 NOTE — Telephone Encounter (Signed)
Last refill:07-09-12 Last OV:09-26-12 Please advise.//AB/CMA

## 2012-11-06 ENCOUNTER — Telehealth: Payer: Self-pay | Admitting: *Deleted

## 2012-11-06 NOTE — Telephone Encounter (Signed)
I called and gave results of carotid doppler to daughter, Toniann Fail, no significant change compared to previous study. She verbalized understanding.   Needs 6 mo f/u with Heide Guile, NP

## 2013-01-20 ENCOUNTER — Other Ambulatory Visit: Payer: Self-pay | Admitting: Family Medicine

## 2013-01-20 NOTE — Telephone Encounter (Signed)
Last filled: 10/22/12  Last visit: 09/26/12  Contract on file, Please advise. SW, CMA

## 2013-02-10 ENCOUNTER — Other Ambulatory Visit: Payer: Self-pay | Admitting: Family Medicine

## 2013-02-10 NOTE — Telephone Encounter (Signed)
Med filled.  

## 2013-02-10 NOTE — Telephone Encounter (Signed)
Last OV and Med filled on 09/26/12 #90 with 0 refills. Does not get this [facility] from [facility]. Please disregard previous medication refill.

## 2013-03-12 ENCOUNTER — Other Ambulatory Visit: Payer: Self-pay | Admitting: Family Medicine

## 2013-03-13 NOTE — Telephone Encounter (Signed)
Last OV 5-22 Med filled 10-6 #90 with 0  Low Risk

## 2013-03-13 NOTE — Telephone Encounter (Signed)
Med filled and faxed.  

## 2013-04-11 ENCOUNTER — Other Ambulatory Visit: Payer: Self-pay | Admitting: Family Medicine

## 2013-04-11 NOTE — Telephone Encounter (Signed)
Last OV 5-22  lorazepam 11-5 #90 with 0 Tramadol 9-15 #90 with 0  Low risk

## 2013-04-11 NOTE — Telephone Encounter (Signed)
Med filled and faxed.  

## 2013-04-16 ENCOUNTER — Encounter: Payer: Self-pay | Admitting: Family Medicine

## 2013-04-16 ENCOUNTER — Ambulatory Visit (INDEPENDENT_AMBULATORY_CARE_PROVIDER_SITE_OTHER): Payer: Medicare Other | Admitting: Family Medicine

## 2013-04-16 VITALS — BP 138/70 | HR 63 | Temp 97.6°F | Ht 64.25 in | Wt 122.6 lb

## 2013-04-16 DIAGNOSIS — E785 Hyperlipidemia, unspecified: Secondary | ICD-10-CM

## 2013-04-16 DIAGNOSIS — Z23 Encounter for immunization: Secondary | ICD-10-CM

## 2013-04-16 DIAGNOSIS — I10 Essential (primary) hypertension: Secondary | ICD-10-CM

## 2013-04-16 LAB — HEPATIC FUNCTION PANEL
ALT: 19 U/L (ref 0–35)
AST: 22 U/L (ref 0–37)
Albumin: 4.2 g/dL (ref 3.5–5.2)
Total Protein: 7.3 g/dL (ref 6.0–8.3)

## 2013-04-16 LAB — BASIC METABOLIC PANEL
BUN: 12 mg/dL (ref 6–23)
CO2: 26 mEq/L (ref 19–32)
Chloride: 102 mEq/L (ref 96–112)
Creatinine, Ser: 0.8 mg/dL (ref 0.4–1.2)
Glucose, Bld: 99 mg/dL (ref 70–99)
Potassium: 4.5 mEq/L (ref 3.5–5.1)

## 2013-04-16 LAB — LIPID PANEL
Cholesterol: 154 mg/dL (ref 0–200)
LDL Cholesterol: 71 mg/dL (ref 0–99)
Triglycerides: 194 mg/dL — ABNORMAL HIGH (ref 0.0–149.0)

## 2013-04-16 NOTE — Progress Notes (Signed)
Pre visit review using our clinic review tool, if applicable. No additional management support is needed unless otherwise documented below in the visit note. 

## 2013-04-16 NOTE — Patient Instructions (Signed)
Schedule your complete physical in 6 months We'll notify you of your lab results and make any changes if needed Keep up the good work!  You look great! Happy Holidays!!

## 2013-04-16 NOTE — Progress Notes (Signed)
   Subjective:    Patient ID: Beverly Black, female    DOB: [birth year], 79 y.o.   MRN: [id number]  HPI HTN- chronic problem, adequate control today on Amlodipine, atenolol.  No CP, SOB, HAs, visual changes, edema.  Hyperlipidemia- chronic problem, on Lipitor and Zetia.  Denies abd pain, N/V, myalgias.   Review of Systems For ROS see HPI     Objective:   Physical Exam  Vitals reviewed. Constitutional: She is oriented to person, place, and time. She appears well-developed and well-nourished. No distress.  HENT:  Head: Normocephalic and atraumatic.  Eyes: Conjunctivae and EOM are normal. Pupils are equal, round, and reactive to light.  Neck: Normal range of motion. Neck supple. No thyromegaly present.  Cardiovascular: Normal rate, regular rhythm, normal heart sounds and intact distal pulses.   No murmur heard. Pulmonary/Chest: Effort normal and breath sounds normal. No respiratory distress.  Abdominal: Soft. She exhibits no distension. There is no tenderness.  Musculoskeletal: She exhibits no edema.  Lymphadenopathy:    She has no cervical adenopathy.  Neurological: She is alert and oriented to person, place, and time.  Skin: Skin is warm and dry.  Psychiatric: She has a normal mood and affect. Her behavior is normal.          Assessment & Plan:

## 2013-04-16 NOTE — Assessment & Plan Note (Signed)
Chronic problem.  Tolerating statin w/out difficulty.  Check labs.  Adjust meds prn  

## 2013-04-16 NOTE — Assessment & Plan Note (Signed)
Chronic problem.  Adequate control.  Asymptomatic.  Check labs.  No anticipated med changes 

## 2013-04-17 ENCOUNTER — Encounter: Payer: Self-pay | Admitting: *Deleted

## 2013-05-12 ENCOUNTER — Other Ambulatory Visit: Payer: Self-pay | Admitting: Family Medicine

## 2013-05-12 NOTE — Telephone Encounter (Signed)
Last seen-04/16/2013  Last filled-04/11/2013  UDS-04/30/2012 low risk contract signed   Please advise. SW

## 2013-05-16 ENCOUNTER — Other Ambulatory Visit: Payer: Self-pay | Admitting: Family Medicine

## 2013-05-16 NOTE — Telephone Encounter (Signed)
Last OV 12-10 Med filled 09-04-12 #90 with 1

## 2013-05-26 ENCOUNTER — Encounter: Payer: Self-pay | Admitting: Family Medicine

## 2013-06-10 ENCOUNTER — Other Ambulatory Visit: Payer: Self-pay | Admitting: Family Medicine

## 2013-06-11 NOTE — Telephone Encounter (Signed)
Med filled.  

## 2013-06-13 ENCOUNTER — Other Ambulatory Visit: Payer: Self-pay | Admitting: Family Medicine

## 2013-06-13 NOTE — Telephone Encounter (Signed)
Med filled.  

## 2013-06-14 ENCOUNTER — Other Ambulatory Visit: Payer: Self-pay | Admitting: Family Medicine

## 2013-06-16 NOTE — Telephone Encounter (Signed)
Med filled.  

## 2013-07-10 ENCOUNTER — Other Ambulatory Visit: Payer: Self-pay | Admitting: Family Medicine

## 2013-07-10 NOTE — Telephone Encounter (Signed)
Med filled and faxed.  

## 2013-07-10 NOTE — Telephone Encounter (Signed)
Med filled.  

## 2013-07-10 NOTE — Telephone Encounter (Signed)
Last ov 04-16-13 Med filled 04-11-13

## 2013-07-17 ENCOUNTER — Other Ambulatory Visit: Payer: Self-pay | Admitting: Family Medicine

## 2013-07-17 NOTE — Telephone Encounter (Signed)
Med filled.  

## 2013-09-22 ENCOUNTER — Other Ambulatory Visit: Payer: Self-pay | Admitting: Family Medicine

## 2013-09-22 NOTE — Telephone Encounter (Signed)
Med filled.  

## 2013-10-07 ENCOUNTER — Other Ambulatory Visit: Payer: Self-pay | Admitting: Family Medicine

## 2013-10-08 NOTE — Telephone Encounter (Signed)
Med filled and faxed.  

## 2013-10-08 NOTE — Telephone Encounter (Signed)
Last OV 04-16-13 Ativan 06-10-13 #90 with 3 Tramadol 07-10-13 #90 with 0

## 2013-10-10 ENCOUNTER — Other Ambulatory Visit: Payer: Self-pay | Admitting: Family Medicine

## 2013-10-10 NOTE — Telephone Encounter (Signed)
Med filled.  

## 2013-10-15 ENCOUNTER — Telehealth: Payer: Self-pay

## 2013-10-15 NOTE — Telephone Encounter (Signed)
Medication and allergies:  Reviewed and updated  90 day supply/mail order:   [facility] - [place], [place] - [address] Local pharmacy:  [id number] - [place], [place] - [address]   Immunizations due: See below   A/P: Personal, family history and past surgical hx: Reviewed and updated PAP- hx. hysterectomy CCS-never had one MMG- 01/26/12--incomplete----02/01/12-MMG of L Breast--negative BD- 01/26/12- osteoporosis Flu- 04/16/13 Tdap- unsure of last vaccine PNA- 12/26/10 Shingles- DUE   To Discuss with Provider: Nothing at this time.

## 2013-10-16 ENCOUNTER — Other Ambulatory Visit: Payer: Self-pay | Admitting: General Practice

## 2013-10-16 ENCOUNTER — Encounter: Payer: Self-pay | Admitting: Family Medicine

## 2013-10-16 ENCOUNTER — Ambulatory Visit (INDEPENDENT_AMBULATORY_CARE_PROVIDER_SITE_OTHER): Payer: Medicare Other | Admitting: Family Medicine

## 2013-10-16 VITALS — BP 130/82 | HR 66 | Temp 98.0°F | Resp 16 | Ht 64.5 in | Wt 120.5 lb

## 2013-10-16 DIAGNOSIS — M810 Age-related osteoporosis without current pathological fracture: Secondary | ICD-10-CM

## 2013-10-16 DIAGNOSIS — R69 Illness, unspecified: Secondary | ICD-10-CM

## 2013-10-16 DIAGNOSIS — E785 Hyperlipidemia, unspecified: Secondary | ICD-10-CM

## 2013-10-16 DIAGNOSIS — Z0000 Encounter for general adult medical examination without abnormal findings: Secondary | ICD-10-CM

## 2013-10-16 DIAGNOSIS — Z79890 Hormone replacement therapy (postmenopausal): Secondary | ICD-10-CM

## 2013-10-16 DIAGNOSIS — Z7409 Other reduced mobility: Secondary | ICD-10-CM

## 2013-10-16 DIAGNOSIS — I10 Essential (primary) hypertension: Secondary | ICD-10-CM

## 2013-10-16 LAB — CBC WITH DIFFERENTIAL/PLATELET
BASOS PCT: 0.3 % (ref 0.0–3.0)
Basophils Absolute: 0 10*3/uL (ref 0.0–0.1)
EOS PCT: 2.2 % (ref 0.0–5.0)
Eosinophils Absolute: 0.2 10*3/uL (ref 0.0–0.7)
HCT: 40.9 % (ref 36.0–46.0)
Hemoglobin: 13.9 g/dL (ref 12.0–15.0)
LYMPHS PCT: 25.6 % (ref 12.0–46.0)
Lymphs Abs: 2.5 10*3/uL (ref 0.7–4.0)
MCHC: 33.9 g/dL (ref 30.0–36.0)
MCV: 88 fl (ref 78.0–100.0)
MONO ABS: 0.7 10*3/uL (ref 0.1–1.0)
MONOS PCT: 7.5 % (ref 3.0–12.0)
NEUTROS PCT: 64.4 % (ref 43.0–77.0)
Neutro Abs: 6.2 10*3/uL (ref 1.4–7.7)
Platelets: 241 10*3/uL (ref 150.0–400.0)
RBC: 4.64 Mil/uL (ref 3.87–5.11)
RDW: 13.1 % (ref 11.5–15.5)
WBC: 9.6 10*3/uL (ref 4.0–10.5)

## 2013-10-16 LAB — HEPATIC FUNCTION PANEL
ALBUMIN: 3.8 g/dL (ref 3.5–5.2)
ALT: 18 U/L (ref 0–35)
AST: 22 U/L (ref 0–37)
Alkaline Phosphatase: 76 U/L (ref 39–117)
BILIRUBIN TOTAL: 0.3 mg/dL (ref 0.2–1.2)
Bilirubin, Direct: 0 mg/dL (ref 0.0–0.3)
Total Protein: 7 g/dL (ref 6.0–8.3)

## 2013-10-16 LAB — BASIC METABOLIC PANEL
BUN: 11 mg/dL (ref 6–23)
CO2: 28 meq/L (ref 19–32)
CREATININE: 0.7 mg/dL (ref 0.4–1.2)
Calcium: 9.4 mg/dL (ref 8.4–10.5)
Chloride: 101 mEq/L (ref 96–112)
GFR: 92.57 mL/min (ref >60.00)
Glucose, Bld: 93 mg/dL (ref 70–99)
Potassium: 4.3 mEq/L (ref 3.5–5.1)
Sodium: 136 mEq/L (ref 135–145)

## 2013-10-16 LAB — LIPID PANEL
CHOL/HDL RATIO: 4
Cholesterol: 139 mg/dL (ref 0–200)
HDL: 38 mg/dL — AB (ref >39.00)
LDL Cholesterol: 65 mg/dL (ref 0–99)
NONHDL: 101
Triglycerides: 181 mg/dL — ABNORMAL HIGH (ref 0.0–149.0)
VLDL: 36.2 mg/dL (ref 0.0–40.0)

## 2013-10-16 LAB — VITAMIN D 25 HYDROXY (VIT D DEFICIENCY, FRACTURES): VITD: 12.04 ng/mL

## 2013-10-16 LAB — TSH: TSH: 0.79 u[IU]/mL (ref 0.35–4.50)

## 2013-10-16 MED ORDER — VITAMIN D (ERGOCALCIFEROL) 1.25 MG (50000 UNIT) PO CAPS: 50000.0000 [IU] | ORAL_CAPSULE | ORAL | Status: DC

## 2013-10-16 NOTE — Progress Notes (Signed)
Pre visit review using our clinic review tool, if applicable. No additional management support is needed unless otherwise documented below in the visit note. 

## 2013-10-16 NOTE — Assessment & Plan Note (Signed)
Chronic problem.  On Fosamax.  Check Vit D.  Pt due for DEXA later this year.

## 2013-10-16 NOTE — Assessment & Plan Note (Signed)
Chronic problem.  Well controlled.  Asymptomatic.  Check labs.  No anticipated med changes. 

## 2013-10-16 NOTE — Progress Notes (Signed)
   Subjective:    Patient ID: Beverly Black, female    DOB: 1928-08-12, 80 y.o.   MRN: [id number]  HPI Here today for CPE.  Risk Factors: HTN- chronic problem, on Lisinopril, Amlodipine, atenolol.  No CP, SOB, HAs, visual changes, edema. Hyperlipidemia- chronic problem, on Lipitor and Zetia.  Denies abd pain, N/V, + myalgias of thighs bilaterally. Osteoporosis- chronic problem, on Premarin and Fosamax.  UTD on DEXA Limited mobility- pt is walking w/ 4 point cane but is having more difficulty w/ ambulation.  Has not fallen recently.  Pt had 6 visits of HH PT last year. Physical Activity: limited exercise due to fall risk Fall Risk: see above Depression: no current sxs Hearing: mildly decreased to conversational tones at 44ft ADL's: independent Cognitive: normal linear thought process, memory and attention intact Home Safety: feels safe at home, lives alone.  Sister-in-law lives next door Height, Weight, BMI, Visual Acuity: see vitals, vision corrected to 20/20 w/ glasses Counseling: due for mammo and DEXA in September.  Pt has never had colonoscopy, 'i'm not'.  No need for paps. Labs Ordered: See A&P Care Plan: See A&P    Review of Systems Patient reports no vision/ hearing changes, adenopathy,fever, weight change,  persistant/recurrent hoarseness , swallowing issues, chest pain, palpitations, edema, persistant/recurrent cough, hemoptysis, dyspnea (rest/exertional/paroxysmal nocturnal), gastrointestinal bleeding (melena, rectal bleeding), abdominal pain, significant heartburn, bowel changes, GU symptoms (dysuria, hematuria, incontinence), Gyn symptoms (abnormal  bleeding, pain),  syncope, focal weakness, memory loss, numbness & tingling, skin/hair/nail changes, abnormal bruising or bleeding, anxiety, or depression.     Objective:   Physical Exam General Appearance:    Alert, cooperative, no distress, appears stated age  Head:    Normocephalic, without obvious abnormality, atraumatic    Eyes:    PERRL, conjunctiva/corneas clear, EOM's intact, fundi    benign, both eyes  Ears:    Normal TM's and external ear canals, both ears  Nose:   Nares normal, septum midline, mucosa normal, no drainage    or sinus tenderness  Throat:   Lips, mucosa, and tongue normal; teeth and gums normal  Neck:   Supple, symmetrical, trachea midline, no adenopathy;    Thyroid: no enlargement/tenderness/nodules  Back:     Symmetric, no curvature, ROM normal, no CVA tenderness  Lungs:     Clear to auscultation bilaterally, respirations unlabored  Chest Wall:    No tenderness or deformity   Heart:    Regular rate and rhythm, S1 and S2 normal, no murmur, rub   or gallop  Breast Exam:    Deferred to mammo  Abdomen:     Soft, non-tender, bowel sounds active all four quadrants,    no masses, no organomegaly  Genitalia:    Deferred   Rectal:    Extremities:   Extremities normal, atraumatic, no cyanosis or edema  Pulses:   2+ and symmetric all extremities  Skin:   Skin color, texture, turgor normal, no rashes or lesions  Lymph nodes:   Cervical, supraclavicular, and axillary nodes normal  Neurologic:   CNII-XII intact          Assessment & Plan:

## 2013-10-16 NOTE — Patient Instructions (Signed)
Follow up in 3 months to recheck mobility STOP the lipitor- call and stop delivery w/ [facility] We'll notify you of your lab results and make any changes if needed When you get your reminder letter, please schedule your mammogram and bone density (if you don't get a letter by Sept, please call!) We'll work on getting you a rolling walker w/ seat and have someone come out to work w/ you on using it Call with any questions or concerns Have a great summer!

## 2013-10-16 NOTE — Assessment & Plan Note (Signed)
Chronic problem.  Pt having muscle aches in thighs bilaterally- this may be statin related.  D/c Lipitor and continue Zetia daily.  Will follow.

## 2013-10-16 NOTE — Assessment & Plan Note (Signed)
Ongoing issue.  Pt not willing to stop hormones.  Stressed need for yearly mammos.

## 2013-10-16 NOTE — Assessment & Plan Note (Signed)
Pt's PE WNL w/ exception of known mobility issues.  UTD on DEXA, mammo.  Refusing colonoscopy.  No need for paps.  Check labs.  Anticipatory guidance provided.

## 2013-10-16 NOTE — Assessment & Plan Note (Signed)
Pt continues to have difficulty w/ ambulation.  No longer falling frequently but states her legs will 'get stuck- like they don't want to go'.  Discussed w/ pt and daughter that rather than using a cane, a rolling walker w/ a seat would be more appropriate in case she needs to sit and rest.  Also discussed resuming HH PT since pt lives alone and cannot drive.  HH referral placed.  Will follow.

## 2013-10-17 ENCOUNTER — Telehealth: Payer: Self-pay | Admitting: Family Medicine

## 2013-10-17 NOTE — Telephone Encounter (Signed)
Relevant patient education mailed to patient.  

## 2013-10-20 ENCOUNTER — Telehealth: Payer: Self-pay | Admitting: Family Medicine

## 2013-10-20 NOTE — Telephone Encounter (Signed)
Caller name: Almira Relation to pt: Physcial Therapist  Call back number: [phone] Pharmacy:  Reason for call: Almira (PT) called to obtain verbal order to see patient two times a week for four weeks then one time a week for one week to discharge. Also Nino Glowlmira stated that she called [facility],patient received her last walker on 07/13/210 and will not be eligible for a new one until 11/17/2013. Please advise.

## 2013-10-27 ENCOUNTER — Telehealth: Payer: Self-pay | Admitting: *Deleted

## 2013-10-27 DIAGNOSIS — M6281 Muscle weakness (generalized): Secondary | ICD-10-CM

## 2013-10-27 DIAGNOSIS — IMO0001 Reserved for inherently not codable concepts without codable children: Secondary | ICD-10-CM

## 2013-10-27 DIAGNOSIS — Z5189 Encounter for other specified aftercare: Secondary | ICD-10-CM

## 2013-10-27 DIAGNOSIS — R269 Unspecified abnormalities of gait and mobility: Secondary | ICD-10-CM

## 2013-10-27 NOTE — Telephone Encounter (Signed)
Received Home Health Certification and Plan [facility]  Billing sheet attached and placed in folder for Dr. Deboraha Sprangabori.//AB/CMA

## 2013-10-28 NOTE — Telephone Encounter (Signed)
Received completed and signed form from Dr. Beverely Lowabori.  All forms faxed to [facility] at [phone]).  Confirmation received.//AB/CMA

## 2013-11-06 ENCOUNTER — Telehealth: Payer: Self-pay | Admitting: *Deleted

## 2013-11-06 ENCOUNTER — Other Ambulatory Visit: Payer: Self-pay | Admitting: Family Medicine

## 2013-11-06 MED ORDER — LORAZEPAM 0.5 MG PO TABS: ORAL_TABLET | ORAL | Status: DC

## 2013-11-06 NOTE — Telephone Encounter (Signed)
Lorazepam 0.5mg  Last OV- 10/16/13 Last refilled- #90 / 3 rf  06/10/13  UDS- 06/17/12 Low risk.

## 2013-11-06 NOTE — Telephone Encounter (Signed)
Done

## 2013-11-06 NOTE — Telephone Encounter (Signed)
rx faxed to pts [facility]  

## 2013-11-25 ENCOUNTER — Encounter: Payer: Self-pay | Admitting: Nurse Practitioner

## 2013-11-25 ENCOUNTER — Ambulatory Visit (INDEPENDENT_AMBULATORY_CARE_PROVIDER_SITE_OTHER): Payer: 59 | Admitting: Nurse Practitioner

## 2013-11-25 VITALS — BP 146/75 | HR 59 | Temp 97.1°F | Ht 64.0 in | Wt 120.0 lb

## 2013-11-25 DIAGNOSIS — F17200 Nicotine dependence, unspecified, uncomplicated: Secondary | ICD-10-CM

## 2013-11-25 DIAGNOSIS — G214 Vascular parkinsonism: Secondary | ICD-10-CM

## 2013-11-25 DIAGNOSIS — Z8673 Personal history of transient ischemic attack (TIA), and cerebral infarction without residual deficits: Secondary | ICD-10-CM

## 2013-11-25 DIAGNOSIS — R29818 Other symptoms and signs involving the nervous system: Secondary | ICD-10-CM

## 2013-11-25 DIAGNOSIS — R2689 Other abnormalities of gait and mobility: Secondary | ICD-10-CM

## 2013-11-25 DIAGNOSIS — G20 Parkinson's disease: Secondary | ICD-10-CM

## 2013-11-25 DIAGNOSIS — G20A1 Parkinson's disease without dyskinesia, without mention of fluctuations: Secondary | ICD-10-CM

## 2013-11-25 HISTORY — DX: Vascular parkinsonism: G21.4

## 2013-11-25 HISTORY — DX: Personal history of transient ischemic attack (TIA), and cerebral infarction without residual deficits: Z86.73

## 2013-11-25 NOTE — Progress Notes (Signed)
PATIENT: Beverly Black DOB: [birth year]  REASON FOR VISIT: routine follow up for stroke HISTORY FROM: patient and daughter  HISTORY OF PRESENT ILLNESS: 80 y.o.-year-old lady with a left thalamic and corona radiata stroke in [place] 2010 from small vessel disease. Vascular risk factors of hypertension, hyperlipidemia, long-time smoking, and age.   She returns for followup after last visit on 09/07/12 with Dr. Pearlean Black. She continues to do well without recurrence stroke or TIA symptoms. She is tolerating aspirin well without bleeding, bruising. Recent labs show total cholesterol of 139 and LDL of 65.  She continues to have poor balance but denies falls recently.  She uses a cane when away from home.  She lives at home alone with a daughter living close by.  She has never driven a car. She states blood pressure is well controlled though it is slightly elevated at 146/75 in the office today. She does not check her blood pressure at home. She does have mild post stroke parkinsonian symptoms in the form of bradykinesia, poor balance, and shuffling gait but denies any resting or action tremors or drooling of saliva.  She continues to smoke and is not interested in quitting.  ROS:  14 system review of systems is positive for walking difficulty, aching muscles, constipation, anxiety.  ALLERGIES: Allergies  Allergen Reactions  . Sulfa Antibiotics     Cant remember reaction    HOME MEDICATIONS: Outpatient Prescriptions Prior to Visit  Medication Sig Dispense Refill  . alendronate (FOSAMAX) 70 MG tablet Take 1 tablet (70 mg total) by mouth every 7 (seven) days. Take with a full glass of water on an empty stomach.  12 tablet  3  . amLODipine (NORVASC) 10 MG tablet TAKE 1 TABLET (10 MG) AT BEDTIME  90 tablet  2  . aspirin 81 MG tablet Take 81 mg by mouth daily.        [name] atenolol (TENORMIN) 50 MG tablet TAKE 1 TABLET TWICE A DAY  180 tablet  0  . Coenzyme Q10 (COQ-10) 200 MG CAPS Take 1 capsule by mouth  daily.      . fexofenadine (ALLEGRA) 180 MG tablet Take 180 mg by mouth daily.      [name] lisinopril (PRINIVIL,ZESTRIL) 20 MG tablet TAKE 1 TABLET (20 MG) DAILY  90 tablet  2  . LORazepam (ATIVAN) 0.5 MG tablet TAKE 1 TABLET BY MOUTH THREE TIMES DAILY AS NEEDED  90 tablet  0  . mirtazapine (REMERON) 30 MG tablet TAKE 1 TABLET AT BEDTIME  90 tablet  3  . PREMARIN 0.3 MG tablet TAKE 1 TABLET DAILY FOR 21 DAYS THEN DO NOT TAKE FOR 7 DAYS  90 tablet  3  . traMADol (ULTRAM) 50 MG tablet TAKE 1 TABLET BY MOUTH THREE TIMES DAILY AS NEEDED  90 tablet  0  . Vitamin D, Ergocalciferol, (DRISDOL) 50000 UNITS CAPS capsule Take 1 capsule (50,000 Units total) by mouth every 7 (seven) days.  12 capsule  0  . ZETIA 10 MG tablet Take 1 tablet by mouth daily.       No facility-administered medications prior to visit.    PHYSICAL EXAM Filed Vitals:   11/25/13 1035  BP: 146/75  Pulse: 59  Temp: 97.1 F (36.2 C)  TempSrc: Oral  Height: 5\' 4"  (1.626 m)  Weight: 120 lb (54.432 kg)   Body mass index is 20.59 kg/(m^2).  Physical Exam  General: well developed, well nourished, seated, in no evident distress  Head: head normocephalic and  atraumatic. Orohparynx benign  Neck: supple with no carotid or supraclavicular bruits  Cardiovascular: regular rate and rhythm, no murmurs  Musculoskeletal: no deformity. Mild kyphosis  Skin: no rash/petichiae  Vascular: Normal pulses all extremities   Neurologic Exam  Mental Status: Awake and fully alert. Oriented to place and time. Recent and remote memory intact. Diminished recall 1/3. Has difficulty following commands.  Mood and affect appropriate. Positive glabellar tap. Palmomental reflex present on the right but absent on the left.  Cranial Nerves: Fundoscopic exam not done. Pupils equal, briskly reactive to light. Extraocular movements full without nystagmus. Visual fields full to confrontation. Hearing intact. Facial sensation intact. Face, tongue, palate moves normally  and symmetrically.  Motor: Normal bulk and tone. Normal strength in all tested extremity muscles. No resting or action tremor. Mild cogwheel rigidity in both wrists. Slight diminished arm swing when she walks.  Sensory: intact to touch and pinprick and vibratory.  Coordination: Rapid alternating movements normal in all extremities. Finger-to-nose and heel-to-shin performed accurately bilaterally.  Gait and Station: Pushes up from chair with both hands. Stance is mildly stooped. Gait demonstrates diminished stride length and poor balance with mild festination. Retropulsion present. Slight apraxia when she turns to sit down. Unable to heel, toe and tandem walk without difficulty. Carries cane but does not use it effectively as an aid. Reflexes: 1+ and symmetric. Toes downgoing.   ASSESSMENT: 80 y.o. year old lady with a left thalamic and corona radiata stroke in [place] 2010 from small vessel disease. Vascular risk factors of hypertension, hyperlipidemia and age. Mild post stroke parkinsonism.   PLAN:  Continue aspirin for stroke prevention and strict control of hypertension with blood pressure goal below 140/90 and lipids his LDL cholesterol goal below 100 mg percent. I have counseled the patient about gait and balance and advised her to get up slowly to avoid falling down. I have advised her daughter that a rolling walker with a seat may be a better option for her since she tends to carry the cane.  Repeat carotid doppler study. Follow up in 1 year, sooner as needed.   Orders Placed This Encounter  Procedures  . US Carotid Bilateral   Return in about 1 year (around 11/26/2014) for stroke revisit.  Beverly AsalLYNN E. Bryley Chrisman, MSN, FNP-BC, A/GNP-C 11/25/2013, 8:09 PM [facility] [address], [address] [place], [place] [address] [phone]  Note: This document was prepared with digital dictation and possible smart phrase technology. Any transcriptional errors that result from this process are  unintentional.

## 2013-11-25 NOTE — Patient Instructions (Addendum)
Continue aspirin for stroke prevention and strict control of hypertension with blood pressure goal below 140/90 and lipids his LDL cholesterol goal below 100 mg percent. I have counseled the patient about gait and balance and advised her to get up slowly to avoid falling down.    We will schedule you for a repeat carotid doppler study, someone will call you to schedule.  Follow up in 1 year, sooner as needed.  Please try to stop smoking, it is never too late to quit..  Stroke Prevention Some medical conditions and behaviors are associated with an increased chance of having a stroke. You may prevent a stroke by making healthy choices and managing medical conditions. HOW CAN I REDUCE MY RISK OF HAVING A STROKE?   Stay physically active. Get at least 30 minutes of activity on most or all days.  Do not smoke. It may also be helpful to avoid exposure to secondhand smoke.  Limit alcohol use. Moderate alcohol use is considered to be:  No more than 2 drinks per day for men.  No more than 1 drink per day for nonpregnant women.  Eat healthy foods. This involves  Eating 5 or more servings of fruits and vegetables a day.  Following a diet that addresses high blood pressure (hypertension), high cholesterol, diabetes, or obesity.  Manage your cholesterol levels.  A diet low in saturated fat, trans fat, and cholesterol and high in fiber may control cholesterol levels.  Take any prescribed medicines to control cholesterol as directed by your health care provider.  Manage your diabetes.  A controlled-carbohydrate, controlled-sugar diet is recommended to manage diabetes.  Take any prescribed medicines to control diabetes as directed by your health care provider.  Control your hypertension.  A low-salt (sodium), low-saturated fat, low-trans fat, and low-cholesterol diet is recommended to manage hypertension.  Take any prescribed medicines to control hypertension as directed by your health  care provider.  Maintain a healthy weight.  A reduced-calorie, low-sodium, low-saturated fat, low-trans fat, low-cholesterol diet is recommended to manage weight.  Stop drug abuse.  Avoid taking birth control pills.  Talk to your health care provider about the risks of taking birth control pills if you are over 3 years old, smoke, get migraines, or have ever had a blood clot.  Get evaluated for sleep disorders (sleep apnea).  Talk to your health care provider about getting a sleep evaluation if you snore a lot or have excessive sleepiness.  Take medicines as directed by your health care provider.  For some people, aspirin or blood thinners (anticoagulants) are helpful in reducing the risk of forming abnormal blood clots that can lead to stroke. If you have the irregular heart rhythm of atrial fibrillation, you should be on a blood thinner unless there is a good reason you cannot take them.  Understand all your medicine instructions.  Make sure that other other conditions (such as anemia or atherosclerosis) are addressed. SEEK IMMEDIATE MEDICAL CARE IF:   You have sudden weakness or numbness of the face, arm, or leg, especially on one side of the body.  Your face or eyelid droops to one side.  You have sudden confusion.  You have trouble speaking (aphasia) or understanding.  You have sudden trouble seeing in one or both eyes.  You have sudden trouble walking.  You have dizziness.  You have a loss of balance or coordination.  You have a sudden, severe headache with no known cause.  You have new chest pain or an irregular  heartbeat. Any of these symptoms may represent a serious problem that is an emergency. Do not wait to see if the symptoms will go away. Get medical help at once. Call your local emergency services  (911 in [place] Do not drive yourself to the hospital. Document Released: 06/01/2004 Document Revised: 02/12/2013 Document Reviewed: 10/25/2012 [facility]  Patient Information 2015 [employer] [place]. This information is not intended to replace advice given to you by your health care provider. Make sure you discuss any questions you have with your health care provider.

## 2013-11-25 NOTE — Progress Notes (Signed)
I agree with the above plan 

## 2013-11-27 ENCOUNTER — Other Ambulatory Visit: Payer: Self-pay | Admitting: Family Medicine

## 2013-11-27 ENCOUNTER — Telehealth: Payer: Self-pay | Admitting: *Deleted

## 2013-11-27 NOTE — Telephone Encounter (Signed)
Last OV 6-11-5 Med filled 07-10-13 #90 with 0  Low risk

## 2013-11-27 NOTE — Telephone Encounter (Signed)
Received Physician Verbal Order forms from [facility].  Placed in folder for Dr. Beverely Lowabori to review and sign.//AB/CMA

## 2013-11-27 NOTE — Telephone Encounter (Signed)
Received Episode Detail Report form from [facility].  Placed in folder for Dr. Beverely Lowabori to review.//AB/CMA

## 2013-11-27 NOTE — Telephone Encounter (Signed)
Med filled and faxed.  

## 2013-12-08 ENCOUNTER — Telehealth: Payer: Self-pay | Admitting: *Deleted

## 2013-12-08 ENCOUNTER — Other Ambulatory Visit: Payer: Self-pay | Admitting: Internal Medicine

## 2013-12-08 NOTE — Telephone Encounter (Signed)
rx refill- lorazepam 0.5mg  Last OV- 10/16/13 Last refiled- 11/06/13 #90 / 0 rf  UDS- 04/16/13 LOW risk

## 2013-12-08 NOTE — Telephone Encounter (Signed)
I will print out her refill.

## 2013-12-09 NOTE — Telephone Encounter (Signed)
error 

## 2013-12-11 NOTE — Telephone Encounter (Signed)
How much -ill go ahead and print it out for you.

## 2013-12-12 MED ORDER — LORAZEPAM 0.5 MG PO TABS: ORAL_TABLET | ORAL | Status: DC

## 2013-12-12 NOTE — Addendum Note (Signed)
Addended by: Eustace QuailEABOLD, Gari Hartsell J on: 12/12/2013 02:31 PM   Modules accepted: Orders

## 2013-12-12 NOTE — Telephone Encounter (Signed)
Just give her one refill of her lorazepam. Only 90 tabs. No refills. Print and I will sign.

## 2013-12-12 NOTE — Telephone Encounter (Signed)
rx faxed to pts pharmacy - [facility] [place].

## 2013-12-17 ENCOUNTER — Ambulatory Visit (INDEPENDENT_AMBULATORY_CARE_PROVIDER_SITE_OTHER): Payer: Medicare Other

## 2013-12-17 ENCOUNTER — Other Ambulatory Visit: Payer: Self-pay | Admitting: Family Medicine

## 2013-12-17 DIAGNOSIS — F17200 Nicotine dependence, unspecified, uncomplicated: Secondary | ICD-10-CM

## 2013-12-17 DIAGNOSIS — I6350 Cerebral infarction due to unspecified occlusion or stenosis of unspecified cerebral artery: Secondary | ICD-10-CM

## 2013-12-17 DIAGNOSIS — Z8673 Personal history of transient ischemic attack (TIA), and cerebral infarction without residual deficits: Secondary | ICD-10-CM

## 2013-12-17 NOTE — Telephone Encounter (Signed)
Med filled.  

## 2013-12-21 ENCOUNTER — Other Ambulatory Visit: Payer: Self-pay | Admitting: Family Medicine

## 2013-12-22 NOTE — Telephone Encounter (Signed)
Verbal order given  

## 2013-12-22 NOTE — Telephone Encounter (Signed)
Med filled.  

## 2013-12-30 ENCOUNTER — Telehealth: Payer: Self-pay | Admitting: Nurse Practitioner

## 2013-12-30 NOTE — Telephone Encounter (Signed)
Called pt to inform her per Larita Fife, NP that pt's US Carotid Doppler results showed mild hardening of the arteries, age appropriate, but no blockage. I advised the pt that if she has any other problems, questions or concerns to call the office. Pt verbalized understanding.

## 2014-01-05 ENCOUNTER — Other Ambulatory Visit: Payer: Self-pay | Admitting: Family Medicine

## 2014-01-05 NOTE — Telephone Encounter (Signed)
Med filled and faxed.  

## 2014-01-05 NOTE — Telephone Encounter (Signed)
Last OV 10-16-13 Med filled 12-12-13 #90 with 0

## 2014-01-06 ENCOUNTER — Ambulatory Visit: Payer: 59 | Admitting: Neurology

## 2014-01-12 ENCOUNTER — Other Ambulatory Visit: Payer: Self-pay | Admitting: Family Medicine

## 2014-01-14 NOTE — Telephone Encounter (Signed)
Med filled and faxed.  

## 2014-01-14 NOTE — Telephone Encounter (Signed)
Last OV 10-16-13 Tramadol 11-27-13 #90 with 0

## 2014-01-20 ENCOUNTER — Ambulatory Visit (INDEPENDENT_AMBULATORY_CARE_PROVIDER_SITE_OTHER): Payer: Medicare Other | Admitting: Family Medicine

## 2014-01-20 ENCOUNTER — Encounter: Payer: Self-pay | Admitting: Family Medicine

## 2014-01-20 VITALS — BP 130/76 | HR 66 | Temp 97.9°F | Resp 17 | Wt 120.1 lb

## 2014-01-20 DIAGNOSIS — H6123 Impacted cerumen, bilateral: Secondary | ICD-10-CM

## 2014-01-20 DIAGNOSIS — R69 Illness, unspecified: Secondary | ICD-10-CM

## 2014-01-20 DIAGNOSIS — H6120 Impacted cerumen, unspecified ear: Secondary | ICD-10-CM

## 2014-01-20 DIAGNOSIS — Z7409 Other reduced mobility: Secondary | ICD-10-CM

## 2014-01-20 NOTE — Progress Notes (Signed)
   Subjective:    Patient ID: Beverly Black, female    DOB: 06-30-1928, 80 y.o.   MRN: [id number]  HPI Mobility impairment- at last visit, we discussed pt's falls and mobility issues.  Pt started [facility] PT twice weekly x3 weeks.  Pt was told she was improving but daughter reports improvements were short lived.  Pt refused rolling walker.  'i'm too used to that cane'.  Cerumen impaction- 'i can't hear'.  Review of Systems For ROS see HPI     Objective:   Physical Exam  Vitals reviewed. Constitutional: She is oriented to person, place, and time. She appears well-developed and well-nourished. No distress.  Sitting in wheelchair  HENT:  Head: Normocephalic and atraumatic.  TMs obscured by dry wax bilaterally- able to remove w/ curette.  Cardiovascular: Normal rate, regular rhythm, normal heart sounds and intact distal pulses.   Pulmonary/Chest: Effort normal and breath sounds normal. No respiratory distress. She has no wheezes. She has no rales.  Neurological: She is alert and oriented to person, place, and time.  Abnormal gait.  Pt still not able to use cane effectively  Skin: Skin is warm and dry.  Psychiatric: She has a normal mood and affect. Her behavior is normal.          Assessment & Plan:

## 2014-01-20 NOTE — Patient Instructions (Signed)
Schedule an appt in 3 months to recheck BP and cholesterol We'll contact [facility] about getting you a rolling walker for better mobility Call with any questions or concerns Happy Belated Birthday!!!

## 2014-01-20 NOTE — Assessment & Plan Note (Signed)
Ongoing issue for pt.  No improvement following [facility] PT.  Pt still not able to ambulate using cane correctly.  Family feels strongly that she should have rolling walker w/ seat- I agree.  Pt was told in [place] that she did not qualify based on insurance until mid-July.  Since this date has passed, she should be able to get the help that she needs in order for her to ambulate more confidently and safely.

## 2014-01-20 NOTE — Progress Notes (Signed)
Pre visit review using our clinic review tool, if applicable. No additional management support is needed unless otherwise documented below in the visit note. 

## 2014-01-20 NOTE — Assessment & Plan Note (Signed)
Recurrent problem for pt.  Both ears impacted.  Able to remove copious wax from both ears using curette.  Pt reports hearing immediately improved.

## 2014-02-06 ENCOUNTER — Other Ambulatory Visit: Payer: Self-pay

## 2014-02-10 ENCOUNTER — Telehealth: Payer: Self-pay | Admitting: Family Medicine

## 2014-02-10 DIAGNOSIS — M6281 Muscle weakness (generalized): Secondary | ICD-10-CM

## 2014-02-10 NOTE — Telephone Encounter (Signed)
Verbal ok given.

## 2014-02-10 NOTE — Telephone Encounter (Signed)
Caller name: Tara--Gentiva  Relation to pt: Call back number: [phone] Pharmacy:  Reason for call:   [name] needs verbal orders for physical therapy for one time a week for one week and two times a week for four weeks.

## 2014-02-15 ENCOUNTER — Other Ambulatory Visit: Payer: Self-pay | Admitting: Family Medicine

## 2014-02-16 NOTE — Telephone Encounter (Signed)
Med filled.  

## 2014-02-24 ENCOUNTER — Other Ambulatory Visit: Payer: Self-pay | Admitting: Family Medicine

## 2014-02-24 NOTE — Telephone Encounter (Signed)
Last OV 01-14-14 Tramadol fille d9-9-15 #90 with 0

## 2014-02-24 NOTE — Telephone Encounter (Signed)
Med filled and faxed.  

## 2014-03-09 ENCOUNTER — Other Ambulatory Visit: Payer: Self-pay | Admitting: Family Medicine

## 2014-03-09 NOTE — Telephone Encounter (Signed)
Med filled.  

## 2014-03-09 NOTE — Telephone Encounter (Signed)
Last OV 9-15 (cerumen impaction) Med last filled 01-05-14 #90 with 1

## 2014-03-14 ENCOUNTER — Other Ambulatory Visit: Payer: Self-pay | Admitting: Family Medicine

## 2014-03-16 NOTE — Telephone Encounter (Signed)
[facility]  

## 2014-03-25 ENCOUNTER — Encounter: Payer: Self-pay | Admitting: Neurology

## 2014-03-31 ENCOUNTER — Encounter: Payer: Self-pay | Admitting: Neurology

## 2014-04-06 ENCOUNTER — Other Ambulatory Visit: Payer: Self-pay | Admitting: Family Medicine

## 2014-04-06 NOTE — Telephone Encounter (Signed)
Last OV 01-20-14 (cerumen impaction) Lorazepam last filled 03-09-14 #90 with 0

## 2014-04-06 NOTE — Telephone Encounter (Signed)
Med filled and faxed.  

## 2014-04-13 ENCOUNTER — Other Ambulatory Visit: Payer: Self-pay | Admitting: Family Medicine

## 2014-04-13 NOTE — Telephone Encounter (Signed)
Med filled and faxed.  

## 2014-04-13 NOTE — Telephone Encounter (Signed)
Last OV 01-20-14 Tramadol filled 02-24-14 #90 with 0

## 2014-04-18 ENCOUNTER — Other Ambulatory Visit: Payer: Self-pay | Admitting: Family Medicine

## 2014-04-20 NOTE — Telephone Encounter (Signed)
Med filled.  

## 2014-04-24 ENCOUNTER — Ambulatory Visit: Payer: Medicare Other | Admitting: Family Medicine

## 2014-04-29 ENCOUNTER — Ambulatory Visit: Payer: Medicare Other | Admitting: Family Medicine

## 2014-05-06 ENCOUNTER — Ambulatory Visit (INDEPENDENT_AMBULATORY_CARE_PROVIDER_SITE_OTHER): Payer: Medicare Other | Admitting: *Deleted

## 2014-05-06 ENCOUNTER — Ambulatory Visit (INDEPENDENT_AMBULATORY_CARE_PROVIDER_SITE_OTHER): Payer: Medicare Other | Admitting: Family Medicine

## 2014-05-06 ENCOUNTER — Encounter: Payer: Self-pay | Admitting: Family Medicine

## 2014-05-06 VITALS — BP 148/76 | HR 71 | Temp 97.9°F | Resp 16 | Wt 118.6 lb

## 2014-05-06 DIAGNOSIS — E785 Hyperlipidemia, unspecified: Secondary | ICD-10-CM

## 2014-05-06 DIAGNOSIS — I10 Essential (primary) hypertension: Secondary | ICD-10-CM

## 2014-05-06 DIAGNOSIS — Z23 Encounter for immunization: Secondary | ICD-10-CM

## 2014-05-06 LAB — CBC WITH DIFFERENTIAL/PLATELET
BASOS PCT: 0.4 % (ref 0.0–3.0)
Basophils Absolute: 0 10*3/uL (ref 0.0–0.1)
EOS PCT: 1.6 % (ref 0.0–5.0)
Eosinophils Absolute: 0.1 10*3/uL (ref 0.0–0.7)
HCT: 41.1 % (ref 36.0–46.0)
Hemoglobin: 13.6 g/dL (ref 12.0–15.0)
LYMPHS PCT: 27.2 % (ref 12.0–46.0)
Lymphs Abs: 2.2 10*3/uL (ref 0.7–4.0)
MCHC: 33.1 g/dL (ref 30.0–36.0)
MCV: 89 fl (ref 78.0–100.0)
MONOS PCT: 9 % (ref 3.0–12.0)
Monocytes Absolute: 0.7 10*3/uL (ref 0.1–1.0)
NEUTROS ABS: 5 10*3/uL (ref 1.4–7.7)
Neutrophils Relative %: 61.8 % (ref 43.0–77.0)
PLATELETS: 239 10*3/uL (ref 150.0–400.0)
RBC: 4.62 Mil/uL (ref 3.87–5.11)
RDW: 13.4 % (ref 11.5–15.5)
WBC: 8.1 10*3/uL (ref 4.0–10.5)

## 2014-05-06 LAB — HEPATIC FUNCTION PANEL
ALBUMIN: 3.9 g/dL (ref 3.5–5.2)
ALT: 11 U/L (ref 0–35)
AST: 19 U/L (ref 0–37)
Alkaline Phosphatase: 59 U/L (ref 39–117)
Bilirubin, Direct: 0.1 mg/dL (ref 0.0–0.3)
TOTAL PROTEIN: 6.7 g/dL (ref 6.0–8.3)
Total Bilirubin: 0.4 mg/dL (ref 0.2–1.2)

## 2014-05-06 LAB — BASIC METABOLIC PANEL
BUN: 13 mg/dL (ref 6–23)
CALCIUM: 9.2 mg/dL (ref 8.4–10.5)
CO2: 24 mEq/L (ref 19–32)
CREATININE: 0.7 mg/dL (ref 0.4–1.2)
Chloride: 102 mEq/L (ref 96–112)
GFR: 89.27 mL/min (ref >60.00)
Glucose, Bld: 111 mg/dL — ABNORMAL HIGH (ref 70–99)
Potassium: 4.2 mEq/L (ref 3.5–5.1)
SODIUM: 134 meq/L — AB (ref 135–145)

## 2014-05-06 LAB — LIPID PANEL
Cholesterol: 276 mg/dL — ABNORMAL HIGH (ref 0–200)
HDL: 39.9 mg/dL (ref >39.00)
NonHDL: 236.1
TRIGLYCERIDES: 267 mg/dL — AB (ref 0.0–149.0)
Total CHOL/HDL Ratio: 7
VLDL: 53.4 mg/dL — ABNORMAL HIGH (ref 0.0–40.0)

## 2014-05-06 LAB — LDL CHOLESTEROL, DIRECT: LDL DIRECT: 190.1 mg/dL

## 2014-05-06 NOTE — Patient Instructions (Signed)
Schedule your complete physical in 6 months We'll notify you of your lab results and make any changes if needed (including restarting cholesterol meds) Call with any questions or concerns Happy New Year!!!

## 2014-05-06 NOTE — Progress Notes (Signed)
   Subjective:    Patient ID: Beverly Black, female    DOB: 04/06/1929, 80 y.o.   MRN: [id number]  HPI HTN- chronic problem, on Lisinopril, amlodipine, atenolol.  No CP, SOB, HAs, visual changes, edema.  Hyperlipidemia- chronic problem, on Zetia b/c statin was stopped in [name] due to muscle aches.  Pt reports muscle aches have not improved despite stopping statin.  Due for labs.    Review of Systems For ROS see HPI     Objective:   Physical Exam  Constitutional: She is oriented to person, place, and time. She appears well-developed and well-nourished. No distress.  HENT:  Head: Normocephalic and atraumatic.  Eyes: Conjunctivae and EOM are normal. Pupils are equal, round, and reactive to light.  Neck: Normal range of motion. Neck supple. No thyromegaly present.  Cardiovascular: Normal rate, regular rhythm, normal heart sounds and intact distal pulses.   No murmur heard. Pulmonary/Chest: Effort normal and breath sounds normal. No respiratory distress.  Abdominal: Soft. She exhibits no distension. There is no tenderness.  Musculoskeletal: She exhibits no edema.  Lymphadenopathy:    She has no cervical adenopathy.  Neurological: She is alert and oriented to person, place, and time.  Skin: Skin is warm and dry.  Psychiatric: She has a normal mood and affect. Her behavior is normal.  Vitals reviewed.         Assessment & Plan:

## 2014-05-06 NOTE — Progress Notes (Signed)
Pre visit review using our clinic review tool, if applicable. No additional management support is needed unless otherwise documented below in the visit note. 

## 2014-05-07 ENCOUNTER — Other Ambulatory Visit: Payer: Self-pay | Admitting: Family Medicine

## 2014-05-07 ENCOUNTER — Other Ambulatory Visit: Payer: Self-pay | Admitting: *Deleted

## 2014-05-07 MED ORDER — ATORVASTATIN CALCIUM 40 MG PO TABS: ORAL_TABLET | ORAL | Status: DC

## 2014-05-07 NOTE — Telephone Encounter (Signed)
Med filled and faxed.  

## 2014-05-07 NOTE — Telephone Encounter (Signed)
Last OV 04-06-14 Lorazepam last filled #90 with 0

## 2014-05-10 NOTE — Assessment & Plan Note (Signed)
Chronic problem.  BP is elevated today but in reviewing recent BPs, this seems to be an outlier.  Pt is asymptomatic.  Tolerating meds w/o difficulty.  Check labs.  No anticipated med changes.  Pt expressed understanding and is in agreement w/ plan.

## 2014-05-10 NOTE — Assessment & Plan Note (Signed)
Chronic problem.  Statin was stopped 6 months ago b/c of muscle aches.  Pt reports muscle aches have not improved.  Based on this, and hx of previous CVA, plan is to restart statin.  Pt expressed understanding and is in agreement w/ plan.

## 2014-05-22 ENCOUNTER — Other Ambulatory Visit: Payer: Self-pay | Admitting: Family Medicine

## 2014-05-22 NOTE — Telephone Encounter (Signed)
Med filled.  

## 2014-05-25 ENCOUNTER — Other Ambulatory Visit: Payer: Self-pay | Admitting: Family Medicine

## 2014-05-25 NOTE — Telephone Encounter (Signed)
Medication filled.  

## 2014-05-28 ENCOUNTER — Other Ambulatory Visit: Payer: Self-pay | Admitting: Family Medicine

## 2014-05-28 NOTE — Telephone Encounter (Signed)
Med filled.  

## 2014-06-01 ENCOUNTER — Other Ambulatory Visit: Payer: Self-pay | Admitting: Family Medicine

## 2014-06-01 NOTE — Telephone Encounter (Signed)
Med filled and will fax on Friday.

## 2014-06-01 NOTE — Telephone Encounter (Signed)
Last ov 05/06/14 Lorazepam last filled 05/07/14 #90 with 0

## 2014-06-03 ENCOUNTER — Other Ambulatory Visit: Payer: Self-pay | Admitting: Family Medicine

## 2014-06-03 NOTE — Telephone Encounter (Signed)
Med filled.  

## 2014-07-06 ENCOUNTER — Other Ambulatory Visit: Payer: Self-pay | Admitting: Family Medicine

## 2014-07-06 NOTE — Telephone Encounter (Signed)
Last OV 05-06-14  Lorazepam last filled #90 with 0 Tramadol last filled 05-25-14 #90 with 0

## 2014-07-06 NOTE — Telephone Encounter (Signed)
Med filled.  

## 2014-07-09 ENCOUNTER — Other Ambulatory Visit: Payer: Self-pay | Admitting: Family Medicine

## 2014-07-09 NOTE — Telephone Encounter (Signed)
Spoke with pharmacist and he advised that they never received the faxed medications on 07/06/14. Phoned in the current refill on 07/09/14

## 2014-07-27 ENCOUNTER — Other Ambulatory Visit: Payer: Self-pay | Admitting: Family Medicine

## 2014-07-27 NOTE — Telephone Encounter (Signed)
Med filled.  

## 2014-08-03 ENCOUNTER — Other Ambulatory Visit: Payer: Self-pay | Admitting: Family Medicine

## 2014-08-04 NOTE — Telephone Encounter (Signed)
Ok for #90 on Friday but pt must have UDS

## 2014-08-04 NOTE — Telephone Encounter (Signed)
Last OV 05/06/14 Lorazepam last filled 07/09/14 #90 with 0

## 2014-08-05 NOTE — Telephone Encounter (Signed)
Med filled will notify pt that it is available at the front desk for pick up.

## 2014-08-27 ENCOUNTER — Other Ambulatory Visit: Payer: Self-pay | Admitting: Family Medicine

## 2014-08-27 NOTE — Telephone Encounter (Signed)
Last OV 05/06/14 Tramadol last filled 07/09/14 #90 with 0

## 2014-08-27 NOTE — Telephone Encounter (Signed)
Med filled and faxed.  

## 2014-09-04 ENCOUNTER — Other Ambulatory Visit: Payer: Self-pay | Admitting: Family Medicine

## 2014-09-04 NOTE — Telephone Encounter (Signed)
Med filled and faxed.  

## 2014-09-04 NOTE — Telephone Encounter (Signed)
Last OV 05-06-14 Lorazepam last filled 08-05-14 #90 with 0

## 2014-10-02 ENCOUNTER — Other Ambulatory Visit: Payer: Self-pay | Admitting: Family Medicine

## 2014-10-02 NOTE — Telephone Encounter (Signed)
Last OV 05-06-14 Lorazepam last filled 4-29 #90 with 0

## 2014-10-02 NOTE — Telephone Encounter (Signed)
Med filled and faxed.  

## 2014-10-08 ENCOUNTER — Emergency Department (HOSPITAL_COMMUNITY): Payer: Medicare Other

## 2014-10-08 ENCOUNTER — Encounter (HOSPITAL_COMMUNITY): Payer: Self-pay | Admitting: Nurse Practitioner

## 2014-10-08 ENCOUNTER — Emergency Department (HOSPITAL_COMMUNITY)
Admission: EM | Admit: 2014-10-08 | Discharge: 2014-10-09 | Disposition: A | Discharge status: Home / Self Care | Payer: Medicare Other | Attending: Emergency Medicine | Admitting: Emergency Medicine

## 2014-10-08 DIAGNOSIS — E785 Hyperlipidemia, unspecified: Secondary | ICD-10-CM | POA: Insufficient documentation

## 2014-10-08 DIAGNOSIS — Z720 Tobacco use: Secondary | ICD-10-CM | POA: Diagnosis not present

## 2014-10-08 DIAGNOSIS — Y9389 Activity, other specified: Secondary | ICD-10-CM | POA: Diagnosis not present

## 2014-10-08 DIAGNOSIS — S42211A Unspecified displaced fracture of surgical neck of right humerus, initial encounter for closed fracture: Secondary | ICD-10-CM | POA: Diagnosis not present

## 2014-10-08 DIAGNOSIS — Z8673 Personal history of transient ischemic attack (TIA), and cerebral infarction without residual deficits: Secondary | ICD-10-CM | POA: Insufficient documentation

## 2014-10-08 DIAGNOSIS — Z7982 Long term (current) use of aspirin: Secondary | ICD-10-CM | POA: Diagnosis not present

## 2014-10-08 DIAGNOSIS — I10 Essential (primary) hypertension: Secondary | ICD-10-CM | POA: Diagnosis not present

## 2014-10-08 DIAGNOSIS — T1490XA Injury, unspecified, initial encounter: Secondary | ICD-10-CM

## 2014-10-08 DIAGNOSIS — M810 Age-related osteoporosis without current pathological fracture: Secondary | ICD-10-CM | POA: Insufficient documentation

## 2014-10-08 DIAGNOSIS — Y998 Other external cause status: Secondary | ICD-10-CM | POA: Insufficient documentation

## 2014-10-08 DIAGNOSIS — Y92008 Other place in unspecified non-institutional (private) residence as the place of occurrence of the external cause: Secondary | ICD-10-CM | POA: Insufficient documentation

## 2014-10-08 DIAGNOSIS — W01198A Fall on same level from slipping, tripping and stumbling with subsequent striking against other object, initial encounter: Secondary | ICD-10-CM | POA: Diagnosis not present

## 2014-10-08 DIAGNOSIS — S42301A Unspecified fracture of shaft of humerus, right arm, initial encounter for closed fracture: Secondary | ICD-10-CM

## 2014-10-08 DIAGNOSIS — Z79899 Other long term (current) drug therapy: Secondary | ICD-10-CM | POA: Insufficient documentation

## 2014-10-08 DIAGNOSIS — S51011A Laceration without foreign body of right elbow, initial encounter: Secondary | ICD-10-CM | POA: Diagnosis not present

## 2014-10-08 DIAGNOSIS — S4991XA Unspecified injury of right shoulder and upper arm, initial encounter: Secondary | ICD-10-CM | POA: Diagnosis present

## 2014-10-08 MED ORDER — BACITRACIN ZINC 500 UNIT/GM EX OINT
1.0000 "application " | TOPICAL_OINTMENT | Freq: Two times a day (BID) | CUTANEOUS | Status: DC
  Administered 2014-10-08: 1 via TOPICAL
  Filled 2014-10-08: qty 1.8

## 2014-10-08 MED ORDER — NAPROXEN 500 MG PO TABS: 500.0000 mg | ORAL_TABLET | Freq: Two times a day (BID) | ORAL | Status: DC

## 2014-10-08 MED ORDER — TRAMADOL HCL 50 MG PO TABS: 50.0000 mg | ORAL_TABLET | Freq: Four times a day (QID) | ORAL | Status: DC | PRN

## 2014-10-08 NOTE — ED Provider Notes (Signed)
CSN: [id number]     Arrival date & time 10/08/14  2217 History   First MD Initiated Contact with Patient 10/08/14 2219     Chief Complaint  Patient presents with  . Fall  . Shoulder Injury     (Consider location/radiation/quality/duration/timing/severity/associated sxs/prior Treatment) HPI Comments: 81 y/o female who was at home when she lost her balance and fell on her R shoulder and elbow - she fell backwards and hit her elbow and shoulder with acute onset of pain and swelling of the R shoulder - this is constant, worse with movement - immobilized with a sling just prior to arrival and had of fentanyl for pain with good improvement - denies numbness of the RUE at the hand.  Patient is a 81 y.o. female presenting with fall and shoulder injury. The history is provided by the patient.  Fall  Shoulder Injury    Past Medical History  Diagnosis Date  . Hypertension   . Hyperlipidemia   . Stroke 10/2008  . Aneurysm     of the eye  . Osteoporosis    Past Surgical History  Procedure Laterality Date  . Eye surgery    . Partial hysterectomy  1968   Family History  Problem Relation Age of Onset  . Stroke Father   . Heart failure Mother    History  Substance Use Topics  . Smoking status: Current Every Day Smoker -- 0.50 packs/day for 50 years  . Smokeless tobacco: Not on file  . Alcohol Use: No   OB History    No data available     Review of Systems  All other systems reviewed and are negative.     Allergies  Sulfa antibiotics  Home Medications   Prior to Admission medications   Medication Sig Start Date End Date Taking? Authorizing Provider  alendronate (FOSAMAX) 70 MG tablet Take 1 tablet (70 mg total) by mouth every 7 (seven) days. Take with a full glass of water on an empty stomach. 07/16/12   Sheliah Hatch, MD  amLODipine (NORVASC) 10 MG tablet TAKE 1 TABLET AT BEDTIME 07/27/14   Sheliah Hatch, MD  aspirin 81 MG tablet Take 81 mg by mouth daily.       Historical Provider, MD  atenolol (TENORMIN) 50 MG tablet TAKE 1 TABLET TWICE A DAY 05/28/14   Sheliah Hatch, MD  atorvastatin (LIPITOR) 40 MG tablet TAKE 1 TABLET (40 MG) DAILY 05/07/14   Sheliah Hatch, MD  cholecalciferol (VITAMIN D) 1000 UNITS tablet Take 1,000 Units by mouth daily.    Historical Provider, MD  Coenzyme Q10 (COQ-10) 200 MG CAPS Take 1 capsule by mouth daily.    Historical Provider, MD  fexofenadine (ALLEGRA) 180 MG tablet Take 180 mg by mouth daily.    Historical Provider, MD  lisinopril (PRINIVIL,ZESTRIL) 20 MG tablet TAKE 1 TABLET DAILY Patient taking differently: TAKE 1 TABLET BY MOUTH DAILY 07/27/14   Sheliah Hatch, MD  LORazepam (ATIVAN) 0.5 MG tablet TAKE 1 TABLET BY MOUTH THREE TIMES DAILY AS NEEDED 10/02/14   Sheliah Hatch, MD  mirtazapine (REMERON) 30 MG tablet TAKE 1 TABLET AT BEDTIME 04/20/14   Sheliah Hatch, MD  naproxen (NAPROSYN) 500 MG tablet Take 1 tablet (500 mg total) by mouth 2 (two) times daily with a meal. 10/08/14   Eber Hong, MD  PREMARIN 0.3 MG tablet TAKE 1 TABLET DAILY FOR 21 DAYS THEN DO NOT TAKE FOR 7 DAYS 10/10/13   Natalia Leatherwood  Assunta FoundE Tabori, MD  traMADol (ULTRAM) 50 MG tablet TAKE 1 TABLET BY MOUTH THREE TIMES DAILY AS NEEDED 08/27/14   Sheliah HatchKatherine E Tabori, MD  traMADol (ULTRAM) 50 MG tablet Take 1 tablet (50 mg total) by mouth every 6 (six) hours as needed. 10/08/14   Eber HongBrian Louie Flenner, MD  Vitamin D, Ergocalciferol, (DRISDOL) 50000 UNITS CAPS capsule TAKE 1 CAPSULE EVERY 7 DAYS 12/17/13   Sheliah HatchKatherine E Tabori, MD  ZETIA 10 MG tablet TAKE 1 TABLET DAILY 05/22/14   Sheliah HatchKatherine E Tabori, MD   BP 139/67 mmHg  Pulse 70  Temp(Src) 97.8 F (36.6 C) (Oral)  Resp 14  SpO2 96% Physical Exam  Constitutional: She appears well-developed and well-nourished. No distress.  HENT:  Head: Normocephalic and atraumatic.  Mouth/Throat: Oropharynx is clear and moist. No oropharyngeal exudate.  Eyes: Conjunctivae and EOM are normal. Pupils are equal, round,  and reactive to light. Right eye exhibits no discharge. Left eye exhibits no discharge. No scleral icterus.  Neck: Normal range of motion. Neck supple. No JVD present. No thyromegaly present.  Cardiovascular: Normal rate, regular rhythm, normal heart sounds and intact distal pulses.  Exam reveals no gallop and no friction rub.   No murmur heard. Pulmonary/Chest: Effort normal and breath sounds normal. No respiratory distress. She has no wheezes. She has no rales.  Abdominal: Soft. Bowel sounds are normal. She exhibits no distension and no mass. There is no tenderness.  Musculoskeletal: She exhibits tenderness. She exhibits no edema.  Dec ROM 2/2 pain and swelling of the shoulder on the R and some pain with ROM o fthe elbow on the R, all other joints are supple, compartments are soft  Lymphadenopathy:    She has no cervical adenopathy.  Neurological: She is alert. Coordination normal.  Skin: Skin is warm and dry.  Skin tear superficial to the R elbow - no repairable laceration.  Psychiatric: She has a normal mood and affect. Her behavior is normal.  Nursing note and vitals reviewed.   ED Course  Procedures (including critical care time) Labs Review Labs Reviewed - No data to display  Imaging Review Dg Shoulder Right  10/08/2014   CLINICAL DATA:  Status post fall, with obvious swelling and deformity at the right shoulder. Initial encounter.  EXAM: RIGHT SHOULDER - 2+ VIEW  COMPARISON:  None.  FINDINGS: There is a mildly comminuted fracture involving the right humeral neck, with mild proximal displacement of the distal humerus and rotation of the humeral head. The humeral head remains seated at the glenoid fossa. Superior subluxation of the humeral head suggests an underlying chronic rotator cuff tear.  The right acromioclavicular joint is unremarkable in appearance. Known soft swelling is not well characterized on radiograph. Mild peribronchial thickening is noted at the right lung.  IMPRESSION:  Mildly comminuted fracture involving the right humeral neck, with mild proximal displacement of the distal humerus and rotation of the humeral head. Superior subluxation of the humeral head suggests an underlying chronic rotator cuff tear.   Electronically Signed   By: Roanna RaiderJeffery  Chang M.D.   On: 10/08/2014 23:06   Dg Elbow Complete Right  10/08/2014   CLINICAL DATA:  Status post fall, with swelling and deformity of the right elbow. Initial encounter.  EXAM: RIGHT ELBOW - COMPLETE 3+ VIEW  COMPARISON:  None.  FINDINGS: There is no evidence of fracture or dislocation. The visualized joint spaces are preserved. No significant joint effusion is identified. The soft tissues are unremarkable in appearance.  IMPRESSION: No evidence of  fracture or dislocation.   Electronically Signed   By: Roanna Raider M.D.   On: 10/08/2014 23:07   Dg Humerus Right  10/08/2014   CLINICAL DATA:  Right upper arm pain, swelling, and deformity after fall.  EXAM: RIGHT HUMERUS - 2+ VIEW  COMPARISON:  None.  FINDINGS: There is a comminuted displaced fracture of the surgical neck of the humerus with anterior displacement of humeral shaft. Limited assessment of the glenohumeral extension. The distal humerus is intact.  IMPRESSION: Comminuted displaced proximal humeral fracture involving the surgical neck.   Electronically Signed   By: Rubye Oaks M.D.   On: 10/08/2014 23:08      MDM   Final diagnoses:  Trauma  Fracture of right humerus, closed, initial encounter    imag to r/o frx / dislocation of the shoulder and elbow.  VS normal - no other acute findings needing w/u at this time.  xrays confrim prox hum frx, pt immobilized and informed - stable for d/c.  Family in agreement - will order home health - they will stay with her tonight  Meds given in ED:  Medications  bacitracin ointment 1 application (1 application Topical Given 10/08/14 2305)    New Prescriptions   NAPROXEN (NAPROSYN) 500 MG TABLET    Take 1 tablet  (500 mg total) by mouth 2 (two) times daily with a meal.   TRAMADOL (ULTRAM) 50 MG TABLET    Take 1 tablet (50 mg total) by mouth every 6 (six) hours as needed.      Eber Hong, MD 10/08/14 2329

## 2014-10-08 NOTE — ED Notes (Addendum)
Pt presented from EMS, both pt and medics report a fall, obvious swelling, deformity and mild crepitus to the right shoulder and elbow, denies LOC, head injury or dizziness. Received 100 mcg fentanyl en route, only c/o pain when she moves.

## 2014-10-08 NOTE — Discharge Instructions (Signed)
If you have Tramadol at home, please do not fill this prescription Naprosyn twice daily Ice and elevate your shoulder as much as possible Read attached instructions Call your orthopedist for recheck in 1 week - if you don't have one, call Dr. Eulah PontMurphy for follow up appointment.  Please call your doctor for a followup appointment within 24-48 hours. When you talk to your doctor please let them know that you were seen in the emergency department and have them acquire all of your records so that they can discuss the findings with you and formulate a treatment plan to fully care for your new and ongoing problems.

## 2014-10-08 NOTE — ED Notes (Signed)
Bed: FA21WA24 Expected date:  Expected time:  Means of arrival:  Comments: EMS 81 yo female fall/injury/sling/fentanyl

## 2014-10-09 ENCOUNTER — Telehealth: Payer: Self-pay | Admitting: Family Medicine

## 2014-10-09 DIAGNOSIS — S42301A Unspecified fracture of shaft of humerus, right arm, initial encounter for closed fracture: Secondary | ICD-10-CM

## 2014-10-09 NOTE — Telephone Encounter (Signed)
Please advise, pt has not been seen since December

## 2014-10-09 NOTE — Telephone Encounter (Signed)
Pt needs ortho to evaluate her for a comminuted fracture of the humerus (we can put in referral but they would be responsible for getting her to the appt).  If they are unable to care for her at home, she will need admission to the hospital or possibly a short term rehab facility.  If she is unable to get up for an ortho appt, she will need to go back to hospital by ambulance.

## 2014-10-09 NOTE — Telephone Encounter (Signed)
STAT referral placed.

## 2014-10-09 NOTE — Telephone Encounter (Signed)
Caller name: Beverly Black Relationship to patient: daughter Can be reached: [phone]  Reason for call: [facility]. She fractured her right humorous. They gave her a sling but she doesn't keep her arm in it. Daughter states her shoulder has popped a few times. She states that she, her husband, and her doctor all work and they won't be at home with her long term. She states she was told pt would benefit from home health. They can barely get her up and to the bathroom or car to get in for an appt. She asked if there is an ambulance or service that could bring her in or if she should have the patient taken back to the hospital. Would Dr. Beverely Lowabori order home health? Does pt need an ortho doctor? Please call her with advice.

## 2014-10-11 ENCOUNTER — Other Ambulatory Visit: Payer: Self-pay | Admitting: Family Medicine

## 2014-10-12 ENCOUNTER — Emergency Department (HOSPITAL_COMMUNITY)
Admission: EM | Admit: 2014-10-12 | Discharge: 2014-10-13 | Disposition: A | Discharge status: Skilled Nursing Facility | Payer: Medicare Other | Attending: Emergency Medicine | Admitting: Emergency Medicine

## 2014-10-12 ENCOUNTER — Encounter (HOSPITAL_COMMUNITY): Payer: Self-pay | Admitting: Emergency Medicine

## 2014-10-12 DIAGNOSIS — I10 Essential (primary) hypertension: Secondary | ICD-10-CM

## 2014-10-12 DIAGNOSIS — Z8673 Personal history of transient ischemic attack (TIA), and cerebral infarction without residual deficits: Secondary | ICD-10-CM | POA: Diagnosis not present

## 2014-10-12 DIAGNOSIS — R260 Ataxic gait: Secondary | ICD-10-CM | POA: Diagnosis present

## 2014-10-12 DIAGNOSIS — Z7982 Long term (current) use of aspirin: Secondary | ICD-10-CM | POA: Diagnosis not present

## 2014-10-12 DIAGNOSIS — Z79899 Other long term (current) drug therapy: Secondary | ICD-10-CM | POA: Diagnosis not present

## 2014-10-12 DIAGNOSIS — E780 Pure hypercholesterolemia: Secondary | ICD-10-CM | POA: Diagnosis not present

## 2014-10-12 DIAGNOSIS — G214 Vascular parkinsonism: Secondary | ICD-10-CM

## 2014-10-12 DIAGNOSIS — Z720 Tobacco use: Secondary | ICD-10-CM | POA: Diagnosis not present

## 2014-10-12 DIAGNOSIS — M810 Age-related osteoporosis without current pathological fracture: Secondary | ICD-10-CM | POA: Insufficient documentation

## 2014-10-12 DIAGNOSIS — M791 Myalgia, unspecified site: Secondary | ICD-10-CM

## 2014-10-12 DIAGNOSIS — Z791 Long term (current) use of non-steroidal anti-inflammatories (NSAID): Secondary | ICD-10-CM | POA: Diagnosis not present

## 2014-10-12 DIAGNOSIS — R2689 Other abnormalities of gait and mobility: Secondary | ICD-10-CM

## 2014-10-12 NOTE — Progress Notes (Addendum)
EDCM spoke to patient and her family at bedside.  Per patient's family, patient is unable to ambulate or got to the bathroom on her own.  Patient's female family member in tears reporting, "What's wrong with you people?  We have to work!  She need 24 hour care."  EDCM explained to patient 's family that we usually do not place patients from the ED.  EDCM also explained that we can arrange home health services with a social worker to arrange placement from home if needed.  Patient's female family member grew more upset reporting, "We cannot take care of her at home!"  [facility]  10/12/2014 A. Sears Oran RNCM 2207pm  EDCM spoke to family at bedside.  EDCM informed patient and her family that we will attempt to place patient into SNF.  However, if we cannot, patient will be discharged home with home health services with social worker to aid in placement from community.  Patient and patient's family verbalized understanding.  Discussed patient with EDPA who is agreeable to plan.  EDPA placed order for PT eval.  EDCM called and left message with [facility] rehab to see patient in the am.  EDSW beginig search for facility.  This EDCM attempted to ambulate patient at bedside.  Patient walking with shuffling gait and leaning backwards while walking.  Patient with history of a stroke. Per ED tech, patient was assisted to bathroom via wheelchair earlier in shift and took two people to assist patient to stand and turn patient to sit on commode.  EDCM went back into patient's room with EDSW and asked patient if she was agreeable to go to facility for short term rehab?  Patient reports she is agreeable to go for 2 weeks. No further EDCM needs at this time.

## 2014-10-12 NOTE — ED Notes (Signed)
Pt c/o fractured humerus onset Thursday, pt seen in ED for the same on Thursday. Pt requesting placement in rehab facility because family cannot care for patient anymore. Pt cannot walk, cannot use bathroom independently, cannot get out of bed.

## 2014-10-12 NOTE — ED Notes (Signed)
Bed: WA08 Expected date:  Expected time:  Means of arrival:  Comments: tri

## 2014-10-12 NOTE — ED Notes (Addendum)
Patient took home medications for pain Naproxen 500 mg and Tramadol 50 mg PO

## 2014-10-12 NOTE — ED Provider Notes (Signed)
CSN: [id number]     Arrival date & time 10/12/14  1709 History   First MD Initiated Contact with Patient 10/12/14 2055     Chief Complaint  Patient presents with  . Seeking Placement      (Consider location/radiation/quality/duration/timing/severity/associated sxs/prior Treatment) The history is provided by the patient and medical records. No language interpreter was used.     Beverly Black is a 81 y.o. female  with a hx of HTN, CVA with residual gait disturbance, osteoporosis presents to the Emergency Department complaining of persistent right arm pain after humerus fracture 4 days ago. He reports this makes it harder to do things at home including get and out of bed.   Record review shows that pt had a mechanical fall on 10/08/2014 with a humerus fracture.  She was discharged home with her daughter.  Pt's daughter reports that she is not mobile due to pain in the shoulder and arm.  She is taking Naproxyn and tramadol.  Pt saw the orthopedist today and did not set her up with therapy.    Patient's daughter and son-in-law are at bedside. They report that she is unable to care for herself in any way at home at this time. He reported that she is unable to get out of bed or use the bedside commode without significant assistance. They report that her gait is poor and has been for a long time but now that her right arm is hurting she is unable to steady herself. They report that they are unable to care for her at home or assisted her in reaching doctors appointments. Prior to her fall the patient lived alone but has been staying with her daughter and son-in-law.    Aside from the right arm pain she denies somatic symptoms.  Past Medical History  Diagnosis Date  . Hypertension   . Hyperlipidemia   . Stroke 10/2008  . Aneurysm     of the eye  . Osteoporosis    Past Surgical History  Procedure Laterality Date  . Eye surgery    . Partial hysterectomy  1968   Family History  Problem Relation  Age of Onset  . Stroke Father   . Heart failure Mother    History  Substance Use Topics  . Smoking status: Current Every Day Smoker -- 0.50 packs/day for 50 years  . Smokeless tobacco: Not on file  . Alcohol Use: No   OB History    No data available     Review of Systems  Constitutional: Negative for fever, diaphoresis, appetite change, fatigue and unexpected weight change.  HENT: Negative for mouth sores.   Eyes: Negative for visual disturbance.  Respiratory: Negative for cough, chest tightness, shortness of breath and wheezing.   Cardiovascular: Negative for chest pain.  Gastrointestinal: Negative for nausea, vomiting, abdominal pain, diarrhea and constipation.  Endocrine: Negative for polydipsia, polyphagia and polyuria.  Genitourinary: Negative for dysuria, urgency, frequency and hematuria.  Musculoskeletal: Positive for arthralgias (right arm). Negative for back pain and neck stiffness.  Skin: Negative for rash.  Allergic/Immunologic: Negative for immunocompromised state.  Neurological: Negative for syncope, light-headedness and headaches.  Hematological: Does not bruise/bleed easily.  Psychiatric/Behavioral: Negative for sleep disturbance. The patient is not nervous/anxious.       Allergies  Sulfa antibiotics  Home Medications   Prior to Admission medications   Medication Sig Start Date End Date Taking? Authorizing Provider  amLODipine (NORVASC) 10 MG tablet TAKE 1 TABLET AT BEDTIME 07/27/14  Yes  Sheliah Hatch, MD  aspirin 81 MG tablet Take 81 mg by mouth daily.     Yes Historical Provider, MD  atenolol (TENORMIN) 50 MG tablet TAKE 1 TABLET TWICE A DAY 05/28/14  Yes Sheliah Hatch, MD  atorvastatin (LIPITOR) 40 MG tablet TAKE 1 TABLET DAILY 10/12/14  Yes Sheliah Hatch, MD  cholecalciferol (VITAMIN D) 1000 UNITS tablet Take 1,000 Units by mouth daily.   Yes Historical Provider, MD  Coenzyme Q10 (COQ-10) 200 MG CAPS Take 1 capsule by mouth daily.   Yes  Historical Provider, MD  fexofenadine (ALLEGRA) 180 MG tablet Take 180 mg by mouth daily.   Yes Historical Provider, MD  lisinopril (PRINIVIL,ZESTRIL) 20 MG tablet TAKE 1 TABLET DAILY Patient taking differently: TAKE 1 TABLET BY MOUTH DAILY 07/27/14  Yes Sheliah Hatch, MD  LORazepam (ATIVAN) 0.5 MG tablet TAKE 1 TABLET BY MOUTH THREE TIMES DAILY AS NEEDED 10/02/14  Yes Sheliah Hatch, MD  mirtazapine (REMERON) 30 MG tablet TAKE 1 TABLET AT BEDTIME 04/20/14  Yes Sheliah Hatch, MD  naproxen (NAPROSYN) 500 MG tablet Take 1 tablet (500 mg total) by mouth 2 (two) times daily with a meal. 10/08/14  Yes Eber Hong, MD  PREMARIN 0.3 MG tablet TAKE 1 TABLET DAILY FOR 21 DAYS THEN DO NOT TAKE FOR 7 DAYS 10/10/13  Yes Sheliah Hatch, MD  traMADol (ULTRAM) 50 MG tablet Take 1 tablet (50 mg total) by mouth every 6 (six) hours as needed. Patient taking differently: Take 50 mg by mouth every 6 (six) hours as needed for moderate pain.  10/08/14  Yes Eber Hong, MD  ZETIA 10 MG tablet TAKE 1 TABLET DAILY 05/22/14  Yes Sheliah Hatch, MD  alendronate (FOSAMAX) 70 MG tablet Take 1 tablet (70 mg total) by mouth every 7 (seven) days. Take with a full glass of water on an empty stomach. Patient not taking: Reported on 10/12/2014 07/16/12   Sheliah Hatch, MD   BP 132/91 mmHg  Pulse 79  Temp(Src) 97.8 F (36.6 C) (Oral)  Resp 18  SpO2 98% Physical Exam  Constitutional: She is oriented to person, place, and time. She appears well-developed and well-nourished. No distress.  Awake, alert, nontoxic appearance  HENT:  Head: Normocephalic and atraumatic.  Mouth/Throat: Oropharynx is clear and moist. No oropharyngeal exudate.  Eyes: Conjunctivae are normal. No scleral icterus.  Neck: Normal range of motion. Neck supple.  Cardiovascular: Normal rate, regular rhythm, normal heart sounds and intact distal pulses.   No murmur heard. Pulmonary/Chest: Effort normal and breath sounds normal. No  respiratory distress. She has no wheezes.  Equal chest expansion  Abdominal: Soft. Bowel sounds are normal. She exhibits no mass. There is no tenderness. There is no rebound and no guarding.  Soft and nontender  Musculoskeletal: Normal range of motion. She exhibits no edema.  Right arm in a sling Significant ecchymosis of the right upper arm with tenderness to palpation  Neurological: She is alert and oriented to person, place, and time.  Speech is clear and goal oriented Moves extremities without ataxia When patient attempts to walk she is leaning backwards and has a shuffling gait, she is off balance  Skin: Skin is warm and dry. She is not diaphoretic.  Psychiatric: She has a normal mood and affect.  Nursing note and vitals reviewed.   ED Course  Procedures (including critical care time) Labs Review Labs Reviewed - No data to display  Imaging Review No results found.  EKG Interpretation None      MDM   Final diagnoses:  Poor balance  History of stroke  Vascular parkinsonism  Myalgia  Hypertension, essential   Keenya N Bartolome this emergency department with her family for nursing home placement. She is without somatic symptoms aside from her persistent right arm pain.  I suspect that patient's pain is not well-controlled at home and she is only taking tramadol however her decompensated state prior to her fall has led to further decompensation at home. Will consult social work and case management.  10:06 PM Patient has been assessed by both case management and social work. They report that they will attempt to find placement from the emergency department as she has a [place].    12:58 AM After evaluation by case management and social work patient will remain in the emergency department overnight in an attempt to place her in a skilled nursing facility. If they are unable to do so patient will be discharged home with home health care.  BP 132/91 mmHg  Pulse 79   Temp(Src) 97.8 F (36.6 C) (Oral)  Resp 18  SpO2 98%     Dierdre ForthHannah Verley Pariseau, PA-C 10/13/14 0059  Mancel BaleElliott Wentz, MD 10/13/14 0110

## 2014-10-12 NOTE — Progress Notes (Signed)
CSW saw pt's daughter in hallway tearful. CSW spoke with daughter in conference room.  She states that the pt was here Thursday due to a fall. Also, she says that since Thursday the pt has been living with her in [place].  Daughter states that she and her husband are having a hard time caring for pt. She states that the pt cannot transfer or walk. She states that she is interested in SNF for pt. However, she states that the pt has been skeptical about going.    CSW met with pt at bedside. CSW asked pt about facility. Initially the pt stated that she was not interested, and that she wanted home health. However, family was present. Family persuaded pt that she needed to go to a facility.  Patient states that she has life alert.  Patient states that since her fall she needs assistance completing her ADL's.  CSW will complete an FL2 for pt.  Willette Brace [phone] ED CSW 10/12/2014 10:10 PM

## 2014-10-12 NOTE — Telephone Encounter (Signed)
Med filled.  

## 2014-10-13 ENCOUNTER — Telehealth (HOSPITAL_BASED_OUTPATIENT_CLINIC_OR_DEPARTMENT_OTHER): Payer: Self-pay | Admitting: Emergency Medicine

## 2014-10-13 MED ORDER — LORAZEPAM 0.5 MG PO TABS: 0.5000 mg | ORAL_TABLET | Freq: Three times a day (TID) | ORAL | Status: DC | PRN

## 2014-10-13 MED ORDER — VITAMIN D3 25 MCG (1000 UNIT) PO TABS
1000.0000 [IU] | ORAL_TABLET | Freq: Every day | ORAL | Status: DC
  Administered 2014-10-13: 1000 [IU] via ORAL
  Filled 2014-10-13: qty 1

## 2014-10-13 MED ORDER — ESTROGENS CONJUGATED 0.625 MG PO TABS
0.6250 mg | ORAL_TABLET | Freq: Every day | ORAL | Status: DC
  Administered 2014-10-13: 0.625 mg via ORAL
  Filled 2014-10-13: qty 1

## 2014-10-13 MED ORDER — ATORVASTATIN CALCIUM 40 MG PO TABS
40.0000 mg | ORAL_TABLET | Freq: Every day | ORAL | Status: DC
  Administered 2014-10-13: 40 mg via ORAL
  Filled 2014-10-13: qty 1

## 2014-10-13 MED ORDER — MIRTAZAPINE 30 MG PO TABS: 30.0000 mg | ORAL_TABLET | Freq: Every day | ORAL | Status: DC

## 2014-10-13 MED ORDER — ASPIRIN 81 MG PO CHEW
81.0000 mg | CHEWABLE_TABLET | Freq: Every day | ORAL | Status: DC
  Administered 2014-10-13: 81 mg via ORAL
  Filled 2014-10-13: qty 1

## 2014-10-13 MED ORDER — ATENOLOL 50 MG PO TABS
50.0000 mg | ORAL_TABLET | Freq: Two times a day (BID) | ORAL | Status: DC
  Administered 2014-10-13: 50 mg via ORAL
  Filled 2014-10-13 (×2): qty 1

## 2014-10-13 MED ORDER — LISINOPRIL 20 MG PO TABS
20.0000 mg | ORAL_TABLET | Freq: Every day | ORAL | Status: DC
  Administered 2014-10-13: 20 mg via ORAL
  Filled 2014-10-13: qty 1

## 2014-10-13 MED ORDER — AMLODIPINE BESYLATE 10 MG PO TABS
10.0000 mg | ORAL_TABLET | Freq: Every day | ORAL | Status: DC
  Filled 2014-10-13: qty 1

## 2014-10-13 MED ORDER — LORATADINE 10 MG PO TABS
10.0000 mg | ORAL_TABLET | Freq: Every day | ORAL | Status: DC
Start: 2014-10-13 — End: 2014-10-13
  Administered 2014-10-13: 10 mg via ORAL
  Filled 2014-10-13: qty 1

## 2014-10-13 MED ORDER — ASPIRIN 81 MG PO TABS: 81.0000 mg | ORAL_TABLET | Freq: Every day | ORAL | Status: DC

## 2014-10-13 MED ORDER — EZETIMIBE 10 MG PO TABS
10.0000 mg | ORAL_TABLET | Freq: Every day | ORAL | Status: DC
  Administered 2014-10-13: 10 mg via ORAL
  Filled 2014-10-13: qty 1

## 2014-10-13 MED ORDER — NAPROXEN 500 MG PO TABS: 500.0000 mg | ORAL_TABLET | Freq: Two times a day (BID) | ORAL | Status: DC

## 2014-10-13 MED ORDER — COQ-10 200 MG PO CAPS: 1.0000 | ORAL_CAPSULE | Freq: Every day | ORAL | Status: DC

## 2014-10-13 MED ORDER — TRAMADOL HCL 50 MG PO TABS
50.0000 mg | ORAL_TABLET | Freq: Four times a day (QID) | ORAL | Status: DC | PRN
  Administered 2014-10-13: 50 mg via ORAL
  Filled 2014-10-13: qty 1

## 2014-10-13 NOTE — ED Notes (Signed)
Bed: [id number] Expected date:  Expected time:  Means of arrival:  Comments: RM 8

## 2014-10-13 NOTE — Progress Notes (Signed)
Pt scheduled for d/c to [facility] CM consult acknowledged

## 2014-10-13 NOTE — Progress Notes (Signed)
[facility]. [facility]. CSW awaiting call to send patient to facility. Pt daugher is scheduling time with admissions to complete paperwork. CSW updated RN.   Olga CoasterKristen Tamon Parkerson, LCSW  Clinical Social Work  [facility] [phone]

## 2014-10-13 NOTE — Progress Notes (Signed)
PHARMACIST - PHYSICIAN ORDER COMMUNICATION  CONCERNING: P&T Medication Policy on Herbal Medications  DESCRIPTION:  This patient's order for:  Co-Q  has been noted.  This product(s) is classified as an "herbal" or natural product. Due to a lack of definitive safety studies or FDA approval, nonstandard manufacturing practices, plus the potential risk of unknown drug-drug interactions while on inpatient medications, the Pharmacy and Therapeutics Committee does not permit the use of "herbal" or natural products of this type within [facility].   ACTION TAKEN: The pharmacy department is unable to verify this order at this time and your patient has been informed of this safety policy. Please reevaluate patient's clinical condition at discharge and address if the herbal or natural product(s) should be resumed at that time.

## 2014-10-13 NOTE — Evaluation (Signed)
Physical Therapy Evaluation Patient Details Name: Beverly Black MRN: [id number] DOB: 08/09/1928 Today's Date: 10/13/2014   History of Present Illness  Beverly Black is a 81 y.o. female with a hx of HTN, CVA with residual gait disturbance, osteoporosis presents to the Emergency Department complaining of persistent right arm pain after humerus fracture 10/08/14.  She reports this makes it harder to do things at home including get and out of bed.  Family unable to care for pt, would like SNF placement.  Clinical Impression  Pt admitted with above diagnosis. Pt currently with functional limitations due to the deficits listed below (see PT Problem List). Mod assist for supine to sit, min A sit to stand, mod assist for standing balance. Pt unable to tolerate standing due to pain in R shoulder, pain medication requested. Pt has poor standing balance and needs 24* assist at SNF level.  Pt will benefit from skilled PT to increase their independence and safety with mobility to allow discharge to the venue listed below.   *    Follow Up Recommendations SNF    Equipment Recommendations  None recommended by PT    Recommendations for Other Services       Precautions / Restrictions Precautions Precautions: Fall Precaution Comments: pt reports multiple falls in past year Required Braces or Orthoses: Sling Restrictions Weight Bearing Restrictions: Yes RUE Weight Bearing: Non weight bearing Other Position/Activity Restrictions: NWB RUE      Mobility  Bed Mobility Overal bed mobility: Needs Assistance Bed Mobility: Supine to Sit     Supine to sit: Mod assist     General bed mobility comments: mod assist to raise trunk (pt 60%) and to scoot to EOB  Transfers Overall transfer level: Needs assistance   Transfers: Sit to/from Stand Sit to Stand: Mod assist         General transfer comment: min A to rise from elevated bed, mod A for balance due to posterior lean in standing, pt unable to  tolerate standing with HHA of 1 due to R shoulder pain, pt very unsteady in standing with heavy posterior lean  Ambulation/Gait             General Gait Details: deferred due to pain, poor standing balance  Stairs            Wheelchair Mobility    Modified Rankin (Stroke Patients Only)       Balance Overall balance assessment: Needs assistance   Sitting balance-Leahy Scale: Good   Postural control: Posterior lean Standing balance support: Single extremity supported Standing balance-Leahy Scale: Poor Standing balance comment: Mod assist for static standing balance due to posterior lean, pt stood for 20 seconds, tolerance limited by R shoulder pain                             Pertinent Vitals/Pain Pain Assessment: 0-10 Pain Score: 9  Pain Location: R shoulder with movement Pain Descriptors / Indicators: Sharp Pain Intervention(s): Limited activity within patient's tolerance;Monitored during session;Repositioned;Patient requesting pain meds-RN notified    Home Living Family/patient expects to be discharged to:: Skilled nursing facility                      Prior Function Level of Independence: Independent with assistive device(s)         Comments: pt was independent with mobility using a cane prior to shoulder fx 10/08/14, she reports multiple falls in past  year     Hand Dominance   Dominant Hand: Right    Extremity/Trunk Assessment   Upper Extremity Assessment: RUE deficits/detail RUE Deficits / Details: R shoulder in sling, good grip strength R hand, no edema in hand, repositioned RUE in sling for proper fit, encouraged AROM of R hand         Lower Extremity Assessment: Overall WFL for tasks assessed      Cervical / Trunk Assessment: Kyphotic  Communication   Communication: No difficulties  Cognition Arousal/Alertness: Awake/alert Behavior During Therapy: WFL for tasks assessed/performed Overall Cognitive Status: Within  Functional Limits for tasks assessed                      General Comments      Exercises        Assessment/Plan    PT Assessment Patient needs continued PT services  PT Diagnosis Difficulty walking;Acute pain;Generalized weakness   PT Problem List Pain;Decreased balance;Decreased mobility;Decreased activity tolerance  PT Treatment Interventions Gait training;Functional mobility training;Therapeutic activities;Therapeutic exercise;Patient/family education;Balance training   PT Goals (Current goals can be found in the Care Plan section) Acute Rehab PT Goals Patient Stated Goal: to get stronger at SNF PT Goal Formulation: With patient Time For Goal Achievement: 10/27/14 Potential to Achieve Goals: Fair    Frequency Min 3X/week   Barriers to discharge        Co-evaluation               End of Session Equipment Utilized During Treatment: Gait belt Activity Tolerance: Patient limited by pain Patient left: in bed;with call bell/phone within reach;with nursing/sitter in room (Nursing notified pt's bed is saturated in urine) Nurse Communication: Mobility status    Functional Assessment Tool Used: clinical judgement Functional Limitation: Mobility: Walking and moving around Mobility: Walking and Moving Around Current Status (E4540): At least 60 percent but less than 80 percent impaired, limited or restricted Mobility: Walking and Moving Around Goal Status [phone]): At least 40 percent but less than 60 percent impaired, limited or restricted    Time: 0909-0926 PT Time Calculation (min) (ACUTE ONLY): 17 min   Charges:   PT Evaluation $Initial PT Evaluation Tier I: 1 Procedure     PT G Codes:   PT G-Codes **NOT FOR INPATIENT CLASS** Functional Assessment Tool Used: clinical judgement Functional Limitation: Mobility: Walking and moving around Mobility: Walking and Moving Around Current Status (J4782): At least 60 percent but less than 80 percent impaired,  limited or restricted Mobility: Walking and Moving Around Goal Status [phone]): At least 40 percent but less than 60 percent impaired, limited or restricted    Tamala Ser 10/13/2014, 9:40 AM [phone]

## 2014-10-13 NOTE — Discharge Instructions (Signed)
Routine follow up with your physician in the next week.   Continue Tramadol as needed for pain.  Follow up with orthopedist, Dr Eulah PontMurphy.  Call to schedule appointment.

## 2014-10-13 NOTE — Progress Notes (Signed)
Pt family completing paperwork at [facility] at 3pm. Patient to be transferred at 3pm by ptar. RN to call report to [phone]. CSW arrange transport at 3pm as agreed upon by pt/family, rn, and facility.   Olga CoasterKristen Zabdi Mis, LCSW  Clinical Social Work  [facility] [phone]

## 2014-10-14 ENCOUNTER — Non-Acute Institutional Stay (SKILLED_NURSING_FACILITY): Payer: Medicare Other | Admitting: Internal Medicine

## 2014-10-14 ENCOUNTER — Encounter: Payer: Self-pay | Admitting: Internal Medicine

## 2014-10-14 ENCOUNTER — Telehealth: Payer: Self-pay | Admitting: *Deleted

## 2014-10-14 DIAGNOSIS — E785 Hyperlipidemia, unspecified: Secondary | ICD-10-CM | POA: Diagnosis not present

## 2014-10-14 DIAGNOSIS — S42209A Unspecified fracture of upper end of unspecified humerus, initial encounter for closed fracture: Secondary | ICD-10-CM | POA: Insufficient documentation

## 2014-10-14 DIAGNOSIS — M810 Age-related osteoporosis without current pathological fracture: Secondary | ICD-10-CM

## 2014-10-14 DIAGNOSIS — I10 Essential (primary) hypertension: Secondary | ICD-10-CM

## 2014-10-14 DIAGNOSIS — S42301D Unspecified fracture of shaft of humerus, right arm, subsequent encounter for fracture with routine healing: Secondary | ICD-10-CM | POA: Diagnosis not present

## 2014-10-14 DIAGNOSIS — F419 Anxiety disorder, unspecified: Secondary | ICD-10-CM | POA: Diagnosis not present

## 2014-10-14 HISTORY — DX: Unspecified fracture of upper end of unspecified humerus, initial encounter for closed fracture: S42.209A

## 2014-10-14 NOTE — Assessment & Plan Note (Signed)
Mechanical fall 6/2 with fx. Seen in ortho office, sling. Brought to ED because pt can't get out of bed or doing anything in a sling.Pt was admitted to SNF for OT/PT  from ED.

## 2014-10-14 NOTE — Assessment & Plan Note (Signed)
Pt not taking fosamax. Is taking Vit D

## 2014-10-14 NOTE — Telephone Encounter (Signed)
Unable to reach patient at time of TCM Call. Left message for patient to return call when available.  

## 2014-10-14 NOTE — Assessment & Plan Note (Signed)
Continue lipitor and zetia.  

## 2014-10-14 NOTE — Progress Notes (Signed)
MRN: [id number] Name: Beverly Black  Sex: female Age: 81 y.o. DOB: 1929/04/24  [facility]: [facility] Facility/Room:112 Level Of Care: SNF Provider: Inocencio Homes D Emergency Contacts: Extended Emergency Contact Information Primary Emergency Contact: [name],[name] Address: [address]          [place] [address] [place] Phone: [phone] Relation: Daughter Secondary Emergency Contact: [name] [place] Phone: [phone] Relation: Daughter  Code Status: FULL  Allergies: Sulfa antibiotics  Chief Complaint  Patient presents with  . New Admit To SNF    HPI: Patient is 81 y.o. female who is admitted to SNF for OT/PT s/p arm fx and prior stroke with gait problems.  Past Medical History  Diagnosis Date  . Hypertension   . Hyperlipidemia   . Stroke 10/2008  . Aneurysm     of the eye  . Osteoporosis     Past Surgical History  Procedure Laterality Date  . Eye surgery    . Partial hysterectomy  1968      Medication List       This list is accurate as of: 10/14/14 11:59 PM.  Always use your most recent med list.               alendronate 70 MG tablet  Commonly known as:  FOSAMAX  Take 1 tablet (70 mg total) by mouth every 7 (seven) days. Take with a full glass of water on an empty stomach.     amLODipine 10 MG tablet  Commonly known as:  NORVASC  TAKE 1 TABLET AT BEDTIME     aspirin 81 MG tablet  Take 81 mg by mouth daily.     atenolol 50 MG tablet  Commonly known as:  TENORMIN  TAKE 1 TABLET TWICE A DAY     atorvastatin 40 MG tablet  Commonly known as:  LIPITOR  TAKE 1 TABLET DAILY     cholecalciferol 1000 UNITS tablet  Commonly known as:  VITAMIN D  Take 1,000 Units by mouth daily.     CoQ-10 200 MG Caps  Take 1 capsule by mouth daily.     fexofenadine 180 MG tablet  Commonly known as:  ALLEGRA  Take 180 mg by mouth daily.     lisinopril 20 MG tablet  Commonly known as:   PRINIVIL,ZESTRIL  TAKE 1 TABLET DAILY     LORazepam 0.5 MG tablet  Commonly known as:  ATIVAN  TAKE 1 TABLET BY MOUTH THREE TIMES DAILY AS NEEDED     mirtazapine 30 MG tablet  Commonly known as:  REMERON  TAKE 1 TABLET AT BEDTIME     naproxen 500 MG tablet  Commonly known as:  NAPROSYN  Take 1 tablet (500 mg total) by mouth 2 (two) times daily with a meal.     PREMARIN 0.3 MG tablet  Generic drug:  estrogens (conjugated)  TAKE 1 TABLET DAILY FOR 21 DAYS THEN DO NOT TAKE FOR 7 DAYS     traMADol 50 MG tablet  Commonly known as:  ULTRAM  Take 1 tablet (50 mg total) by mouth every 6 (six) hours as needed.     ZETIA 10 MG tablet  Generic drug:  ezetimibe  TAKE 1 TABLET DAILY        No orders of the defined types were placed in this encounter.    Immunization History  Administered Date(s) Administered  . Influenza,inj,Quad PF,36+ Mos 04/16/2013, 05/06/2014    History  Substance  Use Topics  . Smoking status: Current Every Day Smoker -- 0.50 packs/day for 50 years  . Smokeless tobacco: Not on file  . Alcohol Use: No    Family history is noncontributory    Review of Systems  DATA OBTAINED: from patient, nurse GENERAL:  no fevers, fatigue, appetite changes SKIN: No itching, rash or wounds EYES: No eye pain, redness, discharge EARS: No earache, tinnitus, change in hearing NOSE: No congestion, drainage or bleeding  MOUTH/THROAT: No mouth or tooth pain, No sore throat RESPIRATORY: No cough, wheezing, SOB CARDIAC: No chest pain, palpitations, lower extremity edema  GI: No abdominal pain, No N/V/D or constipation, No heartburn or reflux  GU: No dysuria, frequency or urgency, or incontinence  MUSCULOSKELETAL: No unrelieved bone/joint pain NEUROLOGIC: No headache, dizziness  PSYCHIATRIC: No overt anxiety or sadness, No behavior issue.   Filed Vitals:   10/14/14 1315  BP: 109/63  Pulse: 75  Temp: 97.4 F (36.3 C)  Resp: 20    Physical Exam  GENERAL APPEARANCE:  Alert, conversant,  No acute distress.  SKIN: No diaphoresis rash HEAD: Normocephalic, atraumatic  EYES: Conjunctiva/lids clear. Pupils round, reactive. EOMs intact.  EARS: External exam WNL, canals clear. Hearing grossly normal.  NOSE: No deformity or discharge.  MOUTH/THROAT: Lips w/o lesions  RESPIRATORY: Breathing is even, unlabored. Lung sounds are clear   CARDIOVASCULAR: Heart RRR no murmurs, rubs or gallops. No peripheral edema.   GASTROINTESTINAL: Abdomen is soft, non-tender, not distended w/ normal bowel sounds. GENITOURINARY: Bladder non tender, not distended  MUSCULOSKELETAL: R arm in sling NEUROLOGIC:  Cranial nerves 2-12 grossly intact  PSYCHIATRIC: Mood and affect appropriate to situation, no behavioral issues  Patient Active Problem List   Diagnosis Date Noted  . Leukocytosis 10/15/2014  . Humerus fracture 10/14/2014  . Poor balance 11/25/2013  . History of stroke 11/25/2013  . 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 Myalgia 09/05/2012  . Disequilibrium 09/05/2012  . Hypertension, essential 09/05/2012  . 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 Mobility impaired 07/15/2012  . Physical exam, routine 12/07/2011  . Cerumen impaction 04/04/2011  . Trapezius muscle spasm 04/04/2011  . HTN (hypertension) 11/30/2010  . Hyperlipidemia 11/30/2010  . CVA (cerebral infarction) 11/30/2010  . Postmenopausal HRT (hormone replacement therapy) 11/30/2010  . Anxiety 11/30/2010  . Ankle weakness 11/30/2010    CBC    Component Value Date/Time   WBC 8.1 05/06/2014 1456   RBC 4.62 05/06/2014 1456   HGB 13.6 05/06/2014 1456   HCT 41.1 05/06/2014 1456   PLT 239.0 05/06/2014 1456   MCV 89.0 05/06/2014 1456   LYMPHSABS 2.2 05/06/2014 1456   MONOABS 0.7 05/06/2014 1456   EOSABS 0.1 05/06/2014 1456   BASOSABS 0.0 05/06/2014 1456    CMP     Component Value Date/Time   NA 134* 05/06/2014 1456   K 4.2 05/06/2014 1456   CL 102 05/06/2014 1456   CO2 24 05/06/2014 1456    GLUCOSE 111* 05/06/2014 1456   BUN 13 05/06/2014 1456   CREATININE 0.7 05/06/2014 1456   CALCIUM 9.2 05/06/2014 1456   PROT 6.7 05/06/2014 1456   ALBUMIN 3.9 05/06/2014 1456   AST 19 05/06/2014 1456   ALT 11 05/06/2014 1456   ALKPHOS 59 05/06/2014 1456   BILITOT 0.4 05/06/2014 1456   GFRNONAA >60 10/10/2008 0230   GFRAA  10/10/2008 0230    >60        The eGFR has been calculated using the MDRD equation. This calculation has not been validated in all clinical  situations. eGFR's persistently <60 mL/min signify possible Chronic Kidney Disease.    Assessment and Plan  Humerus fracture Mechanical fall 6/2 with fx. Seen in ortho office, sling. Brought to ED because pt can't get out of bed or doing anything in a sling.Pt was admitted to SNF for OT/PT  from ED.  CVA (cerebral infarction) Prior with gait disturbance;admiited to SNF for OT/PT beccause with addition of arm fx, pt can no longer get around.  Hypertension, essential Cont norvasc 10 mg,tenormin 50 mg and Lisinopril 20 mg  Osteoporosis, post-menopausal Pt not taking fosamax. Is taking Vit D  Anxiety Pt on ativan and remeron  Hyperlipidemia Continue lipitor and zetia.    [name], MD

## 2014-10-14 NOTE — Assessment & Plan Note (Signed)
Pt on ativan and remeron

## 2014-10-14 NOTE — Assessment & Plan Note (Signed)
Cont norvasc 10 mg,tenormin 50 mg and Lisinopril 20 mg

## 2014-10-14 NOTE — Assessment & Plan Note (Signed)
Prior with gait disturbance;admiited to SNF for OT/PT beccause with addition of arm fx, pt can no longer get around.

## 2014-10-15 ENCOUNTER — Non-Acute Institutional Stay (SKILLED_NURSING_FACILITY): Payer: Medicare Other | Admitting: Internal Medicine

## 2014-10-15 ENCOUNTER — Encounter: Payer: Self-pay | Admitting: Internal Medicine

## 2014-10-15 DIAGNOSIS — D72829 Elevated white blood cell count, unspecified: Secondary | ICD-10-CM | POA: Diagnosis not present

## 2014-10-15 DIAGNOSIS — S42309D Unspecified fracture of shaft of humerus, unspecified arm, subsequent encounter for fracture with routine healing: Secondary | ICD-10-CM

## 2014-10-15 HISTORY — DX: Elevated white blood cell count, unspecified: D72.829

## 2014-10-15 NOTE — Progress Notes (Signed)
Patient ID: Beverly Black, female   DOB: 05/07/1929, 81 y.o.   MRN: [id number] MRN: [id number] Name: Beverly Black  Sex: female Age: 81 y.o. DOB: 1928-10-12  [facility]: [facility] Facility/Room:112 Level Of Care: SNF Provider: Wille Celeste Emergency Contacts: Extended Emergency Contact Information Primary Emergency Contact: [name],[name] Address: [address]          [place] [address] [place] Phone: [phone] Relation: Daughter Secondary Emergency Contact: [name] [place] of [place] Phone: [phone] Relation: Daughter  Code Status: FULL  Allergies: Sulfa antibiotics  Chief Complaint  Patient presents with  . Acute Visit    HPI: Patient is 81 y.o. female who is admitted to SNF for OT/PT s/p arm fx and prior stroke with gait problems. Clinically she appears stable routine blood work has come back showing a mildly elevated white count of 11.8 with absolute granulocytes mildly elevated at 8.9.  She does not complain of any fever or chills shortness of breath or increased cough from baseline-does at times apparently complain of some urinary frequency or burning although does not really specifically complain of black today.  Clinically she appears to be stable she is sitting comfortably in her wheelchair she does have a sling applied to her right arm.  She has been afebrile  Past Medical History  Diagnosis Date  . Hypertension   . Hyperlipidemia   . Stroke 10/2008  . Aneurysm     of the eye  . Osteoporosis     Past Surgical History  Procedure Laterality Date  . Eye surgery    . Partial hysterectomy  1968      Medication List       This list is accurate as of: 10/15/14  2:03 PM.  Always use your most recent med list.               alendronate 70 MG tablet  Commonly known as:  FOSAMAX  Take 1 tablet (70 mg total) by mouth every 7 (seven) days. Take with a full glass of water on an empty stomach.     amLODipine 10 MG tablet  Commonly known as:  NORVASC  TAKE 1 TABLET AT BEDTIME     aspirin 81 MG tablet  Take 81 mg by mouth daily.     atenolol 50 MG tablet  Commonly known as:  TENORMIN  TAKE 1 TABLET TWICE A DAY     atorvastatin 40 MG tablet  Commonly known as:  LIPITOR  TAKE 1 TABLET DAILY     cholecalciferol 1000 UNITS tablet  Commonly known as:  VITAMIN D  Take 1,000 Units by mouth daily.     CoQ-10 200 MG Caps  Take 1 capsule by mouth daily.     fexofenadine 180 MG tablet  Commonly known as:  ALLEGRA  Take 180 mg by mouth daily.     lisinopril 20 MG tablet  Commonly known as:  PRINIVIL,ZESTRIL  TAKE 1 TABLET DAILY     LORazepam 0.5 MG tablet  Commonly known as:  ATIVAN  TAKE 1 TABLET BY MOUTH THREE TIMES DAILY AS NEEDED     mirtazapine 30 MG tablet  Commonly known as:  REMERON  TAKE 1 TABLET AT BEDTIME     naproxen 500 MG tablet  Commonly known as:  NAPROSYN  Take 1 tablet (500 mg total) by mouth 2 (two) times daily with a meal.     PREMARIN 0.3 MG tablet  Generic drug:  estrogens (conjugated)  TAKE 1 TABLET DAILY FOR 21 DAYS THEN DO NOT TAKE FOR 7 DAYS     traMADol 50 MG tablet  Commonly known as:  ULTRAM  Take 1 tablet (50 mg total) by mouth every 6 (six) hours as needed.     ZETIA 10 MG tablet  Generic drug:  ezetimibe  TAKE 1 TABLET DAILY        No orders of the defined types were placed in this encounter.    Immunization History  Administered Date(s) Administered  . Influenza,inj,Quad PF,36+ Mos 04/16/2013, 05/06/2014    History  Substance Use Topics  . Smoking status: Current Every Day Smoker -- 0.50 packs/day for 50 years  . Smokeless tobacco: Not on file  . Alcohol Use: No    Family history is noncontributory    Review of Systems  DATA OBTAINED: from patient, , medical record, GENERAL:  no fevers, fatigue, appetite changes SKIN: No itching, rash or wounds EYES: No eye pain, redness, discharge EARS: No earache, tinnitus,  change in hearing NOSE: No congestion, drainage or bleeding  MOUTH/THROAT: No mouth or tooth pain, No sore throat RESPIRATORY: Occasional cough but says this has not increased from baseline  no, wheezing, SOB CARDIAC: No chest pain, palpitations, lower extremity edema  GI: No abdominal pain, No N/V/D or constipation, No heartburn or reflux or patient last bowel movement was yesterday GU: Appears to complain of occasional dysuria frequency MUSCULOSKELETAL: No unrelieved bone/joint pain does have a history of right humerus fracture NEUROLOGIC: No headache, dizziness or focal weakness PSYCHIATRIC: No overt anxiety or sadness, No behavior issue.   Filed Vitals:   10/15/14 1402  BP: 107/51  Pulse: 86  Temp: 98 F (36.7 C)  Resp: 16    Physical Exam  GENERAL APPEARANCE: Alert, conversant,  No acute distress.  SKIN: No diaphoresis rash HEAD: Normocephalic, atraumatic  EYES: Conjunctiva/lids clear. Pupils round, reactive. EOMs intact.  EARS: External exam WNL, canals clear. Hearing grossly normal.  NOSE: No deformity or discharge.  MOUTH/THROAT:oro pharynx clear mucous membranes moist  RESPIRATORY: Breathing is even, unlabored. Lung sounds are clear   CARDIOVASCULAR: Heart RRR no murmurs, rubs or gallops. No peripheral edema.   GASTROINTESTINAL: Abdomen is soft, non-tender, not distended w/ normal bowel sounds. GENITOURINARY: OBladderdoes not appear to be overtly tender- MUSCULOSKELETAL: She has her right arm in a sling there is some bruising of the upper right arm area this appears to be violaceous type I do not see evidence of cellulitis-radial pulses intact grip strength is strong otherwiae No abnormal joints or musculature NEUROLOGIC:  Cranial nerves 2-12 grossly intact. Moves all extremities  PSYCHIATRIC: Mood and affect appropriate to situation, no behavioral issues  Patient Active Problem List   Diagnosis Date Noted  . Leukocytosis 10/15/2014  . Humerus fracture 10/14/2014  .  Poor balance 11/25/2013  . History of stroke 11/25/2013  . 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 Myalgia 09/05/2012  . Disequilibrium 09/05/2012  . Hypertension, essential 09/05/2012  . 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 Mobility impaired 07/15/2012  . Physical exam, routine 12/07/2011  . Cerumen impaction 04/04/2011  . Trapezius muscle spasm 04/04/2011  . HTN (hypertension) 11/30/2010  . Hyperlipidemia 11/30/2010  . CVA (cerebral infarction) 11/30/2010  . Postmenopausal HRT (hormone replacement therapy) 11/30/2010  . Anxiety 11/30/2010  . Ankle weakness 11/30/2010    Abs.  10/14/2014.  WBC 11.8 hemoglobin 11.5 platelets 266.  Sodium 135 potassium 3.6 BUN 23 creatinine 0.6.    CBC    Component  Value Date/Time   WBC 8.1 05/06/2014 1456   RBC 4.62 05/06/2014 1456   HGB 13.6 05/06/2014 1456   HCT 41.1 05/06/2014 1456   PLT 239.0 05/06/2014 1456   MCV 89.0 05/06/2014 1456   LYMPHSABS 2.2 05/06/2014 1456   MONOABS 0.7 05/06/2014 1456   EOSABS 0.1 05/06/2014 1456   BASOSABS 0.0 05/06/2014 1456    CMP     Component Value Date/Time   NA 134* 05/06/2014 1456   K 4.2 05/06/2014 1456   CL 102 05/06/2014 1456   CO2 24 05/06/2014 1456   GLUCOSE 111* 05/06/2014 1456   BUN 13 05/06/2014 1456   CREATININE 0.7 05/06/2014 1456   CALCIUM 9.2 05/06/2014 1456   PROT 6.7 05/06/2014 1456   ALBUMIN 3.9 05/06/2014 1456   AST 19 05/06/2014 1456   ALT 11 05/06/2014 1456   ALKPHOS 59 05/06/2014 1456   BILITOT 0.4 05/06/2014 1456   GFRNONAA >60 10/10/2008 0230   GFRAA  10/10/2008 0230    >60        The eGFR has been calculated using the MDRD equation. This calculation has not been validated in all clinical situations. eGFR's persistently <60 mL/min signify possible Chronic Kidney Disease.    Assessment and Plan  1-mild leukocytosis-clinically patient appears to be stable here without signs of overt infection-with her urinary history will order a UA CandS-also  monitor vital signs pulse ox every shift for 72 hours-she does not appear to have any respiratory changes on exam.  Will update he CBC with differential tomorrow as well.  I suspect this leukocytosis could be reactive.  [id number]    Oluwaferanmi Wain C,

## 2014-10-15 NOTE — Telephone Encounter (Signed)
Patient in nursing home, unable to complete TCM

## 2014-10-17 ENCOUNTER — Encounter: Payer: Self-pay | Admitting: Internal Medicine

## 2014-10-19 ENCOUNTER — Other Ambulatory Visit: Payer: Self-pay | Admitting: Family Medicine

## 2014-10-19 NOTE — Telephone Encounter (Signed)
Med filled.  

## 2014-10-23 ENCOUNTER — Telehealth: Payer: Self-pay | Admitting: Family Medicine

## 2014-10-23 NOTE — Telephone Encounter (Signed)
Spoke with Malva Cogan, PA about situation.  His recommendation was for patient to go to ER.  Tried calling Toniann Fail back to make her aware of his recommendation, however she did not pick up.  Called other daughter, Consuella Lose, who also voiced her concern about patient's change in mental status. She said pt thinks there are people in her room and that she does things like point to her bathroom when referring to the kitchen within the facility.  However, she shared that patient was being followed by a PA who makes rounds once a week.  She is seeing an orthopedic for right arm fx and she has been assessed by psych. She said psych has changed some of her meds.  Per Consuella Lose, pt also came to Rehab on oxygen.  Consuella Lose asked if office would call [facility] to check on patient.

## 2014-10-23 NOTE — Telephone Encounter (Signed)
Caller name: Richards,[name] Relation to pt: daughter  Call back number: [phone]   Reason for call:  Pt broke her arm a few weeks ago and is currently at [facility] facility. Daughter wanted to make you aware pt mental state is disoriented since pt has been in rehab.  Daughter was advised by rehab facility that pt disorientation may be due to the following factors:  *Urine infection  *pt has not been taking Lorezapam because its PRN and pt did not know she had to ask.   Daughter is calling on lunch break and will not be available in another hour to speak but will be available at 5pm. In need of clinical advice in addition daughter wanted to make MD aware of pt condition

## 2014-10-23 NOTE — Telephone Encounter (Signed)
Patient needs ER evaluation giving AMS and oxygen declining especially giving recent fracture -- r/o clot, uti, etc.

## 2014-10-23 NOTE — Telephone Encounter (Signed)
Called [facility] and spoke to [place], Charity fundraiser.  Monet, RN states that she does not notice any confusion during her shift, but she is unaware of whether or not pt becomes confused at night.  She said when patient first arrived she struggled with finishing her sentences, but since then this seems to be improved.  She said that patient "knows her name and takes her meds," speech is coherent and conversations are appropriate. She also says that patient knows to ask for her Lorazepam and has asked for it at appropriate times.  Monet confirmed that patient is being followed by a PA, a MD, and psych.  She also said that labs have been ordered and pt's medications were changed by psych.  Per [facility], they are still waiting on urine culture results, but currently shows no growth, and the blood work that was also drawn at that time seemed to be normal.  Ellard Artis is not sure why family thinks patient is confused.

## 2014-10-23 NOTE — Telephone Encounter (Signed)
Spoke with daughter Consuella Lose and gave her an update.  This seemed to ease her apprehension.  No further needs voiced at this time.

## 2014-10-23 NOTE — Telephone Encounter (Addendum)
Spoke with daughter, Beverly Black, who states patient is currently at [facility] for rehab.  2 weeks ago patient fractured her right humerus bone after a fall.  She was admitted to [facility] on 10/14/14.  Daughter states patient is not being followed by a physician while at the facility and they are concerned about patient's well being.  Per daughter, since patient have been admitted at [facility], the family has noticed that patient has been disoriented and hallucinating.  A urine culture was drawn, but was found to be negative.  The daughter also mentioned that the patient is no longer taking her prescribed Lorazepam because it is written as a PRN medication and the patient was not aware that she had to ask for the medication.  Lastly, daughter mentioned that since she has been at rehab, she has been placed on oxygen.  Per daughter, mother's O2 sats were in the 80's and so staff placed on her oxygen. Patient has a hx of smoking, but has not smoked since Monday, [name].  Daughter states she has not seen patient today, but pt is not exhibiting any other symptoms.   Please advise.

## 2014-11-01 ENCOUNTER — Emergency Department (HOSPITAL_COMMUNITY): Payer: Medicare Other

## 2014-11-01 ENCOUNTER — Inpatient Hospital Stay (HOSPITAL_COMMUNITY)
Admission: EM | Admit: 2014-11-01 | Discharge: 2014-11-07 | DRG: 193 | Disposition: A | Discharge status: Skilled Nursing Facility | Payer: Medicare Other | Attending: Internal Medicine | Admitting: Internal Medicine

## 2014-11-01 ENCOUNTER — Encounter (HOSPITAL_COMMUNITY): Payer: Self-pay | Admitting: *Deleted

## 2014-11-01 DIAGNOSIS — Z8673 Personal history of transient ischemic attack (TIA), and cerebral infarction without residual deficits: Secondary | ICD-10-CM

## 2014-11-01 DIAGNOSIS — R52 Pain, unspecified: Secondary | ICD-10-CM

## 2014-11-01 DIAGNOSIS — Z515 Encounter for palliative care: Secondary | ICD-10-CM | POA: Diagnosis not present

## 2014-11-01 DIAGNOSIS — I10 Essential (primary) hypertension: Secondary | ICD-10-CM | POA: Diagnosis present

## 2014-11-01 DIAGNOSIS — N179 Acute kidney failure, unspecified: Secondary | ICD-10-CM | POA: Diagnosis present

## 2014-11-01 DIAGNOSIS — R0602 Shortness of breath: Secondary | ICD-10-CM | POA: Insufficient documentation

## 2014-11-01 DIAGNOSIS — Y95 Nosocomial condition: Secondary | ICD-10-CM | POA: Diagnosis present

## 2014-11-01 DIAGNOSIS — E870 Hyperosmolality and hypernatremia: Secondary | ICD-10-CM | POA: Diagnosis not present

## 2014-11-01 DIAGNOSIS — Z7982 Long term (current) use of aspirin: Secondary | ICD-10-CM | POA: Diagnosis not present

## 2014-11-01 DIAGNOSIS — Z79899 Other long term (current) drug therapy: Secondary | ICD-10-CM

## 2014-11-01 DIAGNOSIS — S42301D Unspecified fracture of shaft of humerus, right arm, subsequent encounter for fracture with routine healing: Secondary | ICD-10-CM

## 2014-11-01 DIAGNOSIS — M7989 Other specified soft tissue disorders: Secondary | ICD-10-CM | POA: Diagnosis not present

## 2014-11-01 DIAGNOSIS — E785 Hyperlipidemia, unspecified: Secondary | ICD-10-CM | POA: Diagnosis present

## 2014-11-01 DIAGNOSIS — K5900 Constipation, unspecified: Secondary | ICD-10-CM | POA: Diagnosis present

## 2014-11-01 DIAGNOSIS — L8992 Pressure ulcer of unspecified site, stage 2: Secondary | ICD-10-CM | POA: Insufficient documentation

## 2014-11-01 DIAGNOSIS — R0600 Dyspnea, unspecified: Secondary | ICD-10-CM | POA: Diagnosis not present

## 2014-11-01 DIAGNOSIS — R0902 Hypoxemia: Secondary | ICD-10-CM | POA: Diagnosis not present

## 2014-11-01 DIAGNOSIS — F17210 Nicotine dependence, cigarettes, uncomplicated: Secondary | ICD-10-CM | POA: Diagnosis present

## 2014-11-01 DIAGNOSIS — L8990 Pressure ulcer of unspecified site, unspecified stage: Secondary | ICD-10-CM | POA: Diagnosis not present

## 2014-11-01 DIAGNOSIS — J189 Pneumonia, unspecified organism: Principal | ICD-10-CM | POA: Diagnosis present

## 2014-11-01 DIAGNOSIS — J9621 Acute and chronic respiratory failure with hypoxia: Secondary | ICD-10-CM | POA: Diagnosis present

## 2014-11-01 DIAGNOSIS — S42209A Unspecified fracture of upper end of unspecified humerus, initial encounter for closed fracture: Secondary | ICD-10-CM | POA: Diagnosis present

## 2014-11-01 DIAGNOSIS — I5033 Acute on chronic diastolic (congestive) heart failure: Secondary | ICD-10-CM | POA: Diagnosis present

## 2014-11-01 DIAGNOSIS — S42309D Unspecified fracture of shaft of humerus, unspecified arm, subsequent encounter for fracture with routine healing: Secondary | ICD-10-CM | POA: Diagnosis not present

## 2014-11-01 DIAGNOSIS — L89150 Pressure ulcer of sacral region, unstageable: Secondary | ICD-10-CM | POA: Diagnosis present

## 2014-11-01 DIAGNOSIS — E860 Dehydration: Secondary | ICD-10-CM | POA: Diagnosis present

## 2014-11-01 DIAGNOSIS — Z66 Do not resuscitate: Secondary | ICD-10-CM | POA: Diagnosis present

## 2014-11-01 DIAGNOSIS — J441 Chronic obstructive pulmonary disease with (acute) exacerbation: Secondary | ICD-10-CM | POA: Insufficient documentation

## 2014-11-01 DIAGNOSIS — M810 Age-related osteoporosis without current pathological fracture: Secondary | ICD-10-CM | POA: Diagnosis present

## 2014-11-01 DIAGNOSIS — J449 Chronic obstructive pulmonary disease, unspecified: Secondary | ICD-10-CM | POA: Diagnosis present

## 2014-11-01 DIAGNOSIS — K219 Gastro-esophageal reflux disease without esophagitis: Secondary | ICD-10-CM | POA: Diagnosis present

## 2014-11-01 LAB — COMPREHENSIVE METABOLIC PANEL
ALK PHOS: 108 U/L (ref 38–126)
ALT: 24 U/L (ref 14–54)
ANION GAP: 9 (ref 5–15)
AST: 45 U/L — AB (ref 15–41)
Albumin: 1.3 g/dL — ABNORMAL LOW (ref 3.5–5.0)
BILIRUBIN TOTAL: 0.6 mg/dL (ref 0.3–1.2)
BUN: 56 mg/dL — AB (ref 6–20)
CALCIUM: 7.3 mg/dL — AB (ref 8.9–10.3)
CHLORIDE: 109 mmol/L (ref 101–111)
CO2: 25 mmol/L (ref 22–32)
Creatinine, Ser: 1.1 mg/dL — ABNORMAL HIGH (ref 0.44–1.00)
GFR calc Af Amer: 52 mL/min — ABNORMAL LOW (ref >60)
GFR, EST NON AFRICAN AMERICAN: 45 mL/min — AB (ref >60)
Glucose, Bld: 109 mg/dL — ABNORMAL HIGH (ref 65–99)
Potassium: 4.2 mmol/L (ref 3.5–5.1)
Sodium: 143 mmol/L (ref 135–145)
TOTAL PROTEIN: 4.8 g/dL — AB (ref 6.5–8.1)

## 2014-11-01 LAB — CBC WITH DIFFERENTIAL/PLATELET
Basophils Absolute: 0 10*3/uL (ref 0.0–0.1)
Basophils Relative: 0 % (ref 0–1)
EOS PCT: 0 % (ref 0–5)
Eosinophils Absolute: 0 10*3/uL (ref 0.0–0.7)
HEMATOCRIT: 29.5 % — AB (ref 36.0–46.0)
Hemoglobin: 9.7 g/dL — ABNORMAL LOW (ref 12.0–15.0)
LYMPHS PCT: 7 % — AB (ref 12–46)
Lymphs Abs: 1.1 10*3/uL (ref 0.7–4.0)
MCH: 28.3 pg (ref 26.0–34.0)
MCHC: 32.9 g/dL (ref 30.0–36.0)
MCV: 86 fL (ref 78.0–100.0)
MONOS PCT: 7 % (ref 3–12)
Monocytes Absolute: 1.1 10*3/uL — ABNORMAL HIGH (ref 0.1–1.0)
NEUTROS ABS: 13.7 10*3/uL — AB (ref 1.7–7.7)
Neutrophils Relative %: 86 % — ABNORMAL HIGH (ref 43–77)
Platelets: 592 10*3/uL — ABNORMAL HIGH (ref 150–400)
RBC: 3.43 MIL/uL — ABNORMAL LOW (ref 3.87–5.11)
RDW: 15.1 % (ref 11.5–15.5)
WBC: 15.9 10*3/uL — ABNORMAL HIGH (ref 4.0–10.5)

## 2014-11-01 LAB — STREP PNEUMONIAE URINARY ANTIGEN: Strep Pneumo Urinary Antigen: NEGATIVE

## 2014-11-01 LAB — BRAIN NATRIURETIC PEPTIDE: B Natriuretic Peptide: 282.4 pg/mL — ABNORMAL HIGH (ref 0.0–100.0)

## 2014-11-01 LAB — I-STAT TROPONIN, ED: TROPONIN I, POC: 0.06 ng/mL (ref 0.00–0.08)

## 2014-11-01 LAB — MRSA PCR SCREENING: MRSA by PCR: NEGATIVE

## 2014-11-01 MED ORDER — DEXTROSE 5 % IV SOLN
2.0000 g | Freq: Two times a day (BID) | INTRAVENOUS | Status: DC
  Administered 2014-11-01 – 2014-11-06 (×11): 2 g via INTRAVENOUS
  Filled 2014-11-01 (×13): qty 2

## 2014-11-01 MED ORDER — DOCUSATE SODIUM 100 MG PO CAPS
100.0000 mg | ORAL_CAPSULE | Freq: Two times a day (BID) | ORAL | Status: DC
  Administered 2014-11-01 – 2014-11-07 (×12): 100 mg via ORAL
  Filled 2014-11-01 (×15): qty 1

## 2014-11-01 MED ORDER — ONDANSETRON HCL 4 MG/2ML IJ SOLN
4.0000 mg | Freq: Four times a day (QID) | INTRAMUSCULAR | Status: DC | PRN
  Filled 2014-11-01: qty 2

## 2014-11-01 MED ORDER — SENNOSIDES-DOCUSATE SODIUM 8.6-50 MG PO TABS
1.0000 | ORAL_TABLET | Freq: Every day | ORAL | Status: DC
  Administered 2014-11-01 – 2014-11-06 (×6): 1 via ORAL
  Filled 2014-11-01 (×6): qty 1

## 2014-11-01 MED ORDER — EZETIMIBE 10 MG PO TABS
10.0000 mg | ORAL_TABLET | Freq: Every day | ORAL | Status: DC
  Administered 2014-11-01 – 2014-11-07 (×7): 10 mg via ORAL
  Filled 2014-11-01 (×7): qty 1

## 2014-11-01 MED ORDER — ESCITALOPRAM OXALATE 10 MG PO TABS
10.0000 mg | ORAL_TABLET | Freq: Every day | ORAL | Status: DC
  Administered 2014-11-01 – 2014-11-07 (×7): 10 mg via ORAL
  Filled 2014-11-01 (×7): qty 1

## 2014-11-01 MED ORDER — ASPIRIN 81 MG PO TABS: 81.0000 mg | ORAL_TABLET | Freq: Every day | ORAL | Status: DC

## 2014-11-01 MED ORDER — SODIUM CHLORIDE 0.9 % IV SOLN
1000.0000 mL | INTRAVENOUS | Status: DC
  Administered 2014-11-01 (×2): 1000 mL via INTRAVENOUS

## 2014-11-01 MED ORDER — ASPIRIN 81 MG PO CHEW
81.0000 mg | CHEWABLE_TABLET | Freq: Every day | ORAL | Status: DC
  Administered 2014-11-02 – 2014-11-07 (×6): 81 mg via ORAL
  Filled 2014-11-01 (×9): qty 1

## 2014-11-01 MED ORDER — SODIUM CHLORIDE 0.9 % IV SOLN: INTRAVENOUS | Status: DC

## 2014-11-01 MED ORDER — ENSURE ENLIVE PO LIQD
237.0000 mL | Freq: Every day | ORAL | Status: DC
  Administered 2014-11-02 – 2014-11-03 (×2): 237 mL via ORAL

## 2014-11-01 MED ORDER — SODIUM CHLORIDE 0.9 % IV BOLUS (SEPSIS)
1000.0000 mL | Freq: Once | INTRAVENOUS | Status: AC
  Administered 2014-11-01: 1000 mL via INTRAVENOUS

## 2014-11-01 MED ORDER — ENSURE PO LIQD: 237.0000 mL | Freq: Every day | ORAL | Status: DC

## 2014-11-01 MED ORDER — MIRTAZAPINE 30 MG PO TABS
30.0000 mg | ORAL_TABLET | Freq: Every day | ORAL | Status: DC
  Administered 2014-11-01 – 2014-11-06 (×6): 30 mg via ORAL
  Filled 2014-11-01 (×8): qty 1
  Filled 2014-11-01: qty 2

## 2014-11-01 MED ORDER — SENNA-DOCUSATE SODIUM 8.6-50 MG PO TABS: 1.0000 | ORAL_TABLET | Freq: Every day | ORAL | Status: DC

## 2014-11-01 MED ORDER — LORAZEPAM 0.5 MG PO TABS: 0.5000 mg | ORAL_TABLET | Freq: Three times a day (TID) | ORAL | Status: DC | PRN

## 2014-11-01 MED ORDER — VANCOMYCIN HCL IN DEXTROSE 750-5 MG/150ML-% IV SOLN
750.0000 mg | INTRAVENOUS | Status: DC
  Administered 2014-11-01 – 2014-11-04 (×4): 750 mg via INTRAVENOUS
  Filled 2014-11-01 (×5): qty 150

## 2014-11-01 MED ORDER — ENOXAPARIN SODIUM 40 MG/0.4ML ~~LOC~~ SOLN
40.0000 mg | SUBCUTANEOUS | Status: DC
  Administered 2014-11-02 – 2014-11-06 (×5): 40 mg via SUBCUTANEOUS
  Filled 2014-11-01 (×7): qty 0.4

## 2014-11-01 MED ORDER — ONDANSETRON HCL 4 MG PO TABS
4.0000 mg | ORAL_TABLET | Freq: Four times a day (QID) | ORAL | Status: DC | PRN
Start: 2014-11-01 — End: 2014-11-07

## 2014-11-01 MED ORDER — ALBUTEROL SULFATE (2.5 MG/3ML) 0.083% IN NEBU
2.5000 mg | INHALATION_SOLUTION | RESPIRATORY_TRACT | Status: DC | PRN
  Administered 2014-11-02: 2.5 mg via RESPIRATORY_TRACT
  Filled 2014-11-01: qty 3

## 2014-11-01 NOTE — ED Notes (Signed)
MD at bedside. 

## 2014-11-01 NOTE — ED Provider Notes (Signed)
CSN: [id number]     Arrival date & time 11/01/14  1014 History   First MD Initiated Contact with Patient 11/01/14 1015     Chief Complaint  Patient presents with  . Respiratory Distress     (Consider location/radiation/quality/duration/timing/severity/associated sxs/prior Treatment) HPI Comments: Patient with a history of hypertension, hyperlipidemia and most likely COPD on 4 L of oxygen chronically who is a long-term smoker and only recently quit presents from the rehabilitation facility today with hypoxia. Patient recently had a fall with a humerus fracture and was placed in the rehabilitation facility for strengthening earlier this month. She wears 2-4 L of oxygen chronically and based on nursing home notes she typically runs 90-92%. Today when they went to check her she was satting 60% on 4 L of oxygen. Patient denies shortness of breath, chest pain, abdominal pain, nausea, vomiting or fever. She does have a cough which she states is improved from prior. She denies any sputum production.  No lower extremity edema or weight gain. She does complain of pain in her right arm and in the buttocks area.  The history is provided by the patient, the nursing home and the EMS personnel.    Past Medical History  Diagnosis Date  . Hypertension   . Hyperlipidemia   . Stroke 10/2008  . Aneurysm     of the eye  . Osteoporosis    Past Surgical History  Procedure Laterality Date  . Eye surgery    . Partial hysterectomy  1968   Family History  Problem Relation Age of Onset  . Stroke Father   . Heart failure Mother    History  Substance Use Topics  . Smoking status: Current Every Day Smoker -- 0.50 packs/day for 50 years  . Smokeless tobacco: Not on file  . Alcohol Use: No   OB History    No data available     Review of Systems  All other systems reviewed and are negative.     Allergies  Sulfa antibiotics  Home Medications   Prior to Admission medications   Medication Sig Start  Date End Date Taking? Authorizing Provider  aspirin 81 MG tablet Take 81 mg by mouth daily.     Yes Historical Provider, MD  atorvastatin (LIPITOR) 40 MG tablet TAKE 1 TABLET DAILY 10/12/14  Yes Sheliah Hatch, MD  cholecalciferol (VITAMIN D) 1000 UNITS tablet Take 1,000 Units by mouth daily.   Yes Historical Provider, MD  escitalopram (LEXAPRO) 10 MG tablet Take 10 mg by mouth daily.   Yes Historical Provider, MD  fexofenadine (ALLEGRA) 180 MG tablet Take 180 mg by mouth daily.   Yes Historical Provider, MD  lisinopril (PRINIVIL,ZESTRIL) 20 MG tablet TAKE 1 TABLET DAILY Patient taking differently: TAKE 1 TABLET BY MOUTH DAILY 07/27/14  Yes Sheliah Hatch, MD  LORazepam (ATIVAN) 0.5 MG tablet TAKE 1 TABLET BY MOUTH THREE TIMES DAILY AS NEEDED 10/02/14  Yes Sheliah Hatch, MD  mirtazapine (REMERON) 30 MG tablet TAKE 1 TABLET AT BEDTIME 10/19/14  Yes Sheliah Hatch, MD  PREMARIN 0.3 MG tablet TAKE 1 TABLET DAILY FOR 21 DAYS THEN DO NOT TAKE FOR 7 DAYS 10/10/13  Yes Sheliah Hatch, MD  traMADol (ULTRAM) 50 MG tablet Take 1 tablet (50 mg total) by mouth every 6 (six) hours as needed. Patient taking differently: Take 50 mg by mouth every 6 (six) hours as needed for moderate pain.  10/08/14  Yes Eber Hong, MD  alendronate (FOSAMAX) 70 MG  tablet Take 1 tablet (70 mg total) by mouth every 7 (seven) days. Take with a full glass of water on an empty stomach. Patient not taking: Reported on 10/12/2014 07/16/12   Sheliah Hatch, MD  amLODipine (NORVASC) 10 MG tablet TAKE 1 TABLET AT BEDTIME 07/27/14   Sheliah Hatch, MD  atenolol (TENORMIN) 50 MG tablet TAKE 1 TABLET TWICE A DAY 05/28/14   Sheliah Hatch, MD  Coenzyme Q10 (COQ-10) 200 MG CAPS Take 1 capsule by mouth daily.    Historical Provider, MD  naproxen (NAPROSYN) 500 MG tablet Take 1 tablet (500 mg total) by mouth 2 (two) times daily with a meal. 10/08/14   Eber Hong, MD  ZETIA 10 MG tablet TAKE 1 TABLET DAILY 05/22/14   Sheliah Hatch, MD   BP 101/43 mmHg  Pulse 77  Temp(Src) 97.5 F (36.4 C) (Oral)  Resp 21  Ht 5\' 4"  (1.626 m)  SpO2 87% Physical Exam  Constitutional: She is oriented to person, place, and time. She appears well-developed. She appears cachectic. No distress.  HENT:  Head: Normocephalic and atraumatic.  Mouth/Throat: Oropharynx is clear and moist. Mucous membranes are dry.  Eyes: Conjunctivae and EOM are normal. Pupils are equal, round, and reactive to light.  Neck: Normal range of motion. Neck supple.  Cardiovascular: Normal rate, regular rhythm and intact distal pulses.   No murmur heard. Pulmonary/Chest: Effort normal and breath sounds normal. No respiratory distress. She has no wheezes.  Crackles in the bases but otherwise clear.  Wet sounding cough  Abdominal: Soft. She exhibits no distension. There is no tenderness. There is no rebound and no guarding.  Musculoskeletal: Normal range of motion. She exhibits tenderness. She exhibits no edema.  Pressure sore on the right elbow.  Right arm in sling and healing ecchymosis of the right arm  Neurological: She is alert and oriented to person, place, and time.  Skin: Skin is warm and dry. No rash noted. No erythema.  Stage 2-3 decubitus ulcers on bilateral buttocks  Psychiatric: She has a normal mood and affect. Her behavior is normal.  Nursing note and vitals reviewed.   ED Course  Procedures (including critical care time) Labs Review Labs Reviewed  CBC WITH DIFFERENTIAL/PLATELET - Abnormal; Notable for the following:    WBC 15.9 (*)    RBC 3.43 (*)    Hemoglobin 9.7 (*)    HCT 29.5 (*)    Platelets 592 (*)    Neutrophils Relative % 86 (*)    Lymphocytes Relative 7 (*)    Neutro Abs 13.7 (*)    Monocytes Absolute 1.1 (*)    All other components within normal limits  COMPREHENSIVE METABOLIC PANEL - Abnormal; Notable for the following:    Glucose, Bld 109 (*)    BUN 56 (*)    Creatinine, Ser 1.10 (*)    Calcium 7.3 (*)    Total  Protein 4.8 (*)    Albumin 1.3 (*)    AST 45 (*)    GFR calc non Af Amer 45 (*)    GFR calc Af Amer 52 (*)    All other components within normal limits  Rosezena Sensor, ED    Imaging Review Dg Chest Port 1 View  11/01/2014   CLINICAL DATA:  Low oxygen saturations. Larey Seat a few weeks ago. Fracture of the right shoulder. This morning oxygen in the 60s on 4 L.  EXAM: PORTABLE CHEST - 1 VIEW  COMPARISON:  10/09/2008  FINDINGS: Heart  size is normal. There patchy infiltrates in lower lobes bilaterally, right greater than left. No pulmonary edema.  Significant displacement of right humeral neck fracture noted.  IMPRESSION: Bilateral lower lobe infiltrates, right greater than left.   Electronically Signed   By: Norva Pavlov M.D.   On: 11/01/2014 10:40     EKG Interpretation   Date/Time:  Sunday Starlena 26 2016 11:24:43 EDT Ventricular Rate:  73 PR Interval:  132 QRS Duration: 80 QT Interval:  402 QTC Calculation: 443 R Axis:   52 Text Interpretation:  Sinus rhythm Normal ECG No significant change since  last tracing Confirmed by Anitra Lauth  MD, Alphonzo Lemmings ([id number]) on 11/01/2014  12:02:27 PM      MDM   Final diagnoses:  Hypoxia  HCAP (healthcare-associated pneumonia)    Patient with a history of hypertension, chronic oxygen dependence with O2 sats typically ranging in the low 90s recent rehabilitation placement and humeral fracture who presents today from the facility with oxygen saturations in the 60s on her 4 L of oxygen. With non-rebreather by EMS patient's sats improved to 88%.  Patient currently has no complaints. She is coughing on exam but denies shortness of breath. No abdominal pain nausea or vomiting. On 4 L of oxygen patient is satting between 84 and 87%.  No fever or tachycardia noted. Appears slightly dehydrated with dry mucous membranes and no signs of fluid overload.  Based on the medication flowsheet patient still takes Premarin and with recent fracture and immobilization  concerned that she could have a large PE as the cause of her hypoxia versus underlying lung disease versus pneumonia.  Feel that patient is too high risk for d-dimer. CBC, CMP, troponin, EKG and chest x-ray pending. If these do not show the source of hypoxia she will need a CTA of the chest.  11:24 AM Patient has a leukocytosis of 15,000 and a chest x-ray with bilateral pneumonia right greater than left. Patient treated for healthcare associated pneumonia and admitted  Gwyneth Sprout, MD 11/01/14 1203

## 2014-11-01 NOTE — ED Notes (Signed)
Attempted report X1

## 2014-11-01 NOTE — Progress Notes (Signed)
ANTIBIOTIC CONSULT NOTE - INITIAL  Pharmacy Consult for vancomycin Indication: pneumonia  Allergies  Allergen Reactions  . Sulfa Antibiotics     Cant remember reaction    Patient Measurements: Height: 5\' 4"  (162.6 cm) IBW/kg (Calculated) : 54.7  Vital Signs: Temp: 97.5 F (36.4 C) (06/26 1027) Temp Source: Oral (06/26 1027) BP: 104/47 mmHg (06/26 1120) Pulse Rate: 74 (06/26 1120) Intake/Output from previous day:   Intake/Output from this shift:    Labs:  Recent Labs  11/01/14 1050  WBC 15.9*  HGB 9.7*  PLT 592*   CrCl cannot be calculated (Unknown ideal weight.). No results for input(s): VANCOTROUGH, VANCOPEAK, VANCORANDOM, GENTTROUGH, GENTPEAK, GENTRANDOM, TOBRATROUGH, TOBRAPEAK, TOBRARND, AMIKACINPEAK, AMIKACINTROU, AMIKACIN in the last 72 hours.   Microbiology: No results found for this or any previous visit (from the past 720 hour(s)).  Medical History: Past Medical History  Diagnosis Date  . Hypertension   . Hyperlipidemia   . Stroke 10/2008  . Aneurysm     of the eye  . Osteoporosis     Assessment: 48 YOF admitted from [facility] with SOB - wears O2 and was satting very low on 4L this morning at center. With cough, no fever or tachycardia noted. WBC 15.9. SCr 1.1 with est CrCl ~30-6mL/min.  Ceftazidime 2g IV q12h ordered by EDP.  Goal of Therapy:  Vancomycin trough level 15-20 mcg/ml  Plan:  -vancomycin 750mg  IV q24h x8 days of therapy -ceftazidime 2g IV q12h as ordered by EDP- will need to be changed to q24h if renal function worsens -f/u c/s, clinical progression, renal function, levels PRN  Emanuel Dowson D. Atira Borello, PharmD, BCPS Clinical Pharmacist Pager: [phone] 11/01/2014 11:35 AM

## 2014-11-01 NOTE — ED Notes (Signed)
Pt presents via [facility] for low O2 sats.  Pt is from [facility] after a fall a few weeks ago and fx right shoulder.  Staff checked vital signs this AM and found her oxygen in the 60s on 4L (her usual O2).  Pt arrived on [facility], O2 88% per EMS.  Pt denies any complaints other than back pain from the fall.  Per EMS pt was heavy smoker in past.  BP-106/66 P-86.  Pt a x 4.

## 2014-11-01 NOTE — H&P (Signed)
PCP:   [name], MD   Chief Complaint:  Shortness of breath  HPI: 81 year old female who   has a past medical history of Hypertension; Hyperlipidemia; Stroke (10/2008); Aneurysm; and Osteoporosis. today patient was brought to the hospital from skilled facility after patient was found to have low O2 sats of 60% on 4 L of oxygen. Patient was sent for further evaluation. Patient recently had a fall with a humerus fracture and was placed in the rehabilitation facility for strengthening earlier this month. Patient was started on oxygen 3 weeks ago after she was discharged from the [facility] ED as per family. Patient was never diagnosed with COPD and has not been using any inhalers. Today in the ED patient denies shortness of breath, no chest pain no abdominal pain no nausea vomiting or fever. No dysuria urgency frequency of urination. Patient complains of cough.  Chest x-ray showed bilateral infiltrates right more than left. And patient started on vancomycin and Ceptaz  for healthcare associated pneumonia.  Allergies:   Allergies  Allergen Reactions  . Sulfa Antibiotics     Cant remember reaction      Past Medical History  Diagnosis Date  . Hypertension   . Hyperlipidemia   . Stroke 10/2008  . Aneurysm     of the eye  . Osteoporosis     Past Surgical History  Procedure Laterality Date  . Eye surgery    . Partial hysterectomy  1968    Prior to Admission medications   Medication Sig Start Date End Date Taking? Authorizing Provider  alendronate (FOSAMAX) 70 MG tablet Take 1 tablet (70 mg total) by mouth every 7 (seven) days. Take with a full glass of water on an empty stomach. 07/16/12  Yes Sheliah Hatch, MD  amLODipine (NORVASC) 10 MG tablet TAKE 1 TABLET AT BEDTIME 07/27/14  Yes Sheliah Hatch, MD  aspirin 81 MG tablet Take 81 mg by mouth daily.     Yes Historical Provider, MD  atenolol (TENORMIN) 50 MG tablet TAKE 1 TABLET TWICE A DAY 05/28/14  Yes Sheliah Hatch, MD  atorvastatin (LIPITOR) 40 MG tablet TAKE 1 TABLET DAILY 10/12/14  Yes Sheliah Hatch, MD  cholecalciferol (VITAMIN D) 1000 UNITS tablet Take 1,000 Units by mouth daily.   Yes Historical Provider, MD  docusate sodium (COLACE) 100 MG capsule Take 100 mg by mouth 2 (two) times daily.   Yes Historical Provider, MD  ENSURE (ENSURE) Take 237 mLs by mouth daily. At 2 pm   Yes Historical Provider, MD  escitalopram (LEXAPRO) 10 MG tablet Take 10 mg by mouth daily.   Yes Historical Provider, MD  fexofenadine (ALLEGRA) 180 MG tablet Take 180 mg by mouth daily.   Yes Historical Provider, MD  lisinopril (PRINIVIL,ZESTRIL) 20 MG tablet TAKE 1 TABLET DAILY Patient taking differently: TAKE 1 TABLET BY MOUTH DAILY 07/27/14  Yes Sheliah Hatch, MD  LORazepam (ATIVAN) 0.5 MG tablet TAKE 1 TABLET BY MOUTH THREE TIMES DAILY AS NEEDED 10/02/14  Yes Sheliah Hatch, MD  mirtazapine (REMERON) 30 MG tablet TAKE 1 TABLET AT BEDTIME 10/19/14  Yes Sheliah Hatch, MD  naproxen (NAPROSYN) 500 MG tablet Take 1 tablet (500 mg total) by mouth 2 (two) times daily with a meal. 10/08/14  Yes Eber Hong, MD  PREMARIN 0.3 MG tablet TAKE 1 TABLET DAILY FOR 21 DAYS THEN DO NOT TAKE FOR 7 DAYS 10/10/13  Yes Sheliah Hatch, MD  PRESCRIPTION MEDICATION Pt to take  1 two times a day to promote weight gain. MAGIC CUP   Yes Historical Provider, MD  sennosides-docusate sodium (SENOKOT-S) 8.6-50 MG tablet Take 1 tablet by mouth at bedtime.   Yes Historical Provider, MD  traMADol (ULTRAM) 50 MG tablet Take 1 tablet (50 mg total) by mouth every 6 (six) hours as needed. Patient taking differently: Take 50 mg by mouth every 6 (six) hours as needed for moderate pain.  10/08/14  Yes Eber Hong, MD  ZETIA 10 MG tablet TAKE 1 TABLET DAILY 05/22/14  Yes Sheliah Hatch, MD    Social History:  reports that she has been smoking.  She does not have any smokeless tobacco history on file. She reports that she does not drink alcohol  or use illicit drugs.  Family History  Problem Relation Age of Onset  . Stroke Father   . Heart failure Mother     Ceasar Mons Weights   11/01/14 1120  Weight: 53.8 kg (118 lb 9.7 oz)     Review of Systems:  As in history of present illness rest of review of systems negative   Physical Exam: Blood pressure 99/32, pulse 74, temperature 97.5 F (36.4 C), temperature source Oral, resp. rate 15, height 5\' 4"  (1.626 m), weight 53.8 kg (118 lb 9.7 oz), SpO2 92 %. Constitutional:   Patient is emaciated appearing female* in no acute distress and cooperative with exam. Head: Normocephalic and atraumatic Mouth: Mucus membranes moist Eyes: PERRL, EOMI, conjunctivae normal Neck: Supple, No Thyromegaly Cardiovascular: RRR, S1 normal, S2 normal Pulmonary/Chest: CTAB, no wheezes, rales, or rhonchi Abdominal: Soft. Non-tender, non-distended, bowel sounds are normal, no masses, organomegaly, or guarding present.  Neurological: A&O x3, Strength is normal and symmetric bilaterally, cranial nerve II-XII are grossly intact, no focal motor deficit, sensory intact to light touch bilaterally.  Extremities : No Cyanosis, Clubbing or Edema  Labs on Admission:  Basic Metabolic Panel:  Recent Labs Lab 11/01/14 1050  NA 143  K 4.2  CL 109  CO2 25  GLUCOSE 109*  BUN 56*  CREATININE 1.10*  CALCIUM 7.3*   Liver Function Tests:  Recent Labs Lab 11/01/14 1050  AST 45*  ALT 24  ALKPHOS 108  BILITOT 0.6  PROT 4.8*  ALBUMIN 1.3*   No results for input(s): LIPASE, AMYLASE in the last 168 hours. No results for input(s): AMMONIA in the last 168 hours. CBC:  Recent Labs Lab 11/01/14 1050  WBC 15.9*  NEUTROABS 13.7*  HGB 9.7*  HCT 29.5*  MCV 86.0  PLT 592*    Radiological Exams on Admission: Dg Chest Port 1 View  11/01/2014   CLINICAL DATA:  Low oxygen saturations. Larey Seat a few weeks ago. Fracture of the right shoulder. This morning oxygen in the 60s on 4 L.  EXAM: PORTABLE CHEST - 1 VIEW   COMPARISON:  10/09/2008  FINDINGS: Heart size is normal. There patchy infiltrates in lower lobes bilaterally, right greater than left. No pulmonary edema.  Significant displacement of right humeral neck fracture noted.  IMPRESSION: Bilateral lower lobe infiltrates, right greater than left.   Electronically Signed   By: Norva Pavlov M.D.   On: 11/01/2014 10:40    EKG: Independently reviewed. Normal sinus rhythm   Assessment/Plan Active Problems:   Humerus fracture   HCAP (healthcare-associated pneumonia)  Healthcare associated pneumonia Patient  will be admitted due to healthcare associated pneumonia, will start the patient on vancomycin and Ceptaz eating. Continue IV fluids, follow blood culture results. Will check urine for Legionella  and strep pneumo antigen  Dehydration/AK I Patient has mild dehydration, will start IV fluids recheck BMP in a.m.  History of diastolic heart failure Patient's echocardiogram from 2010 reveals grade 1 diastolic dysfunction. Will repeat a BNP and check echocardiogram to assess patient's cardiac function. Consider Lasix once patient improves   Right humerus fracture Right arm in sling, patient has seen orthopedics as outpatient, and they recommended conservative management.  DVT prophylaxis Lovenox  Code status: DO NOT RESUSCITATE  Family discussion: Admission, patients condition and plan of care including tests being ordered have been discussed with the patient and her daughter and son at bedside who indicate understanding and agree with the plan and Code Status.   Time Spent on Admission: 60 min  Aribella Vavra S [facility] Pager: [phone] 11/01/2014, 1:02 PM  If 7PM-7AM, please contact night-coverage  [email or web address]  Password [email or web address]

## 2014-11-02 ENCOUNTER — Inpatient Hospital Stay (HOSPITAL_COMMUNITY): Payer: Medicare Other

## 2014-11-02 DIAGNOSIS — Z515 Encounter for palliative care: Secondary | ICD-10-CM

## 2014-11-02 DIAGNOSIS — L8992 Pressure ulcer of unspecified site, stage 2: Secondary | ICD-10-CM | POA: Insufficient documentation

## 2014-11-02 DIAGNOSIS — R0600 Dyspnea, unspecified: Secondary | ICD-10-CM

## 2014-11-02 LAB — URINE MICROSCOPIC-ADD ON

## 2014-11-02 LAB — CBC
HCT: 29.8 % — ABNORMAL LOW (ref 36.0–46.0)
Hemoglobin: 9.4 g/dL — ABNORMAL LOW (ref 12.0–15.0)
MCH: 27.6 pg (ref 26.0–34.0)
MCHC: 31.5 g/dL (ref 30.0–36.0)
MCV: 87.4 fL (ref 78.0–100.0)
PLATELETS: 510 10*3/uL — AB (ref 150–400)
RBC: 3.41 MIL/uL — ABNORMAL LOW (ref 3.87–5.11)
RDW: 15.4 % (ref 11.5–15.5)
WBC: 16.1 10*3/uL — ABNORMAL HIGH (ref 4.0–10.5)

## 2014-11-02 LAB — BASIC METABOLIC PANEL
ANION GAP: 6 (ref 5–15)
BUN: 41 mg/dL — ABNORMAL HIGH (ref 6–20)
CALCIUM: 6.8 mg/dL — AB (ref 8.9–10.3)
CHLORIDE: 119 mmol/L — AB (ref 101–111)
CO2: 22 mmol/L (ref 22–32)
Creatinine, Ser: 0.84 mg/dL (ref 0.44–1.00)
GFR calc Af Amer: >60 mL/min (ref >60)
GFR calc non Af Amer: >60 mL/min (ref >60)
GLUCOSE: 75 mg/dL (ref 65–99)
Potassium: 3.3 mmol/L — ABNORMAL LOW (ref 3.5–5.1)
Sodium: 147 mmol/L — ABNORMAL HIGH (ref 135–145)

## 2014-11-02 LAB — URINALYSIS, ROUTINE W REFLEX MICROSCOPIC
BILIRUBIN URINE: NEGATIVE
Glucose, UA: NEGATIVE mg/dL
KETONES UR: NEGATIVE mg/dL
NITRITE: NEGATIVE
Protein, ur: 30 mg/dL — AB
Specific Gravity, Urine: 1.019 (ref 1.005–1.030)
UROBILINOGEN UA: 0.2 mg/dL (ref 0.0–1.0)
pH: 5.5 (ref 5.0–8.0)

## 2014-11-02 LAB — MAGNESIUM: Magnesium: 2.3 mg/dL (ref 1.7–2.4)

## 2014-11-02 MED ORDER — CETYLPYRIDINIUM CHLORIDE 0.05 % MT LIQD
7.0000 mL | Freq: Two times a day (BID) | OROMUCOSAL | Status: DC
  Administered 2014-11-02 – 2014-11-05 (×8): 7 mL via OROMUCOSAL

## 2014-11-02 MED ORDER — METOPROLOL TARTRATE 1 MG/ML IV SOLN
5.0000 mg | Freq: Three times a day (TID) | INTRAVENOUS | Status: DC
  Administered 2014-11-02 – 2014-11-07 (×15): 5 mg via INTRAVENOUS
  Filled 2014-11-02 (×20): qty 5

## 2014-11-02 MED ORDER — PANTOPRAZOLE SODIUM 40 MG PO TBEC
40.0000 mg | DELAYED_RELEASE_TABLET | Freq: Two times a day (BID) | ORAL | Status: DC
  Administered 2014-11-02: 40 mg via ORAL
  Filled 2014-11-02: qty 1

## 2014-11-02 MED ORDER — POTASSIUM CHLORIDE CRYS ER 20 MEQ PO TBCR
40.0000 meq | EXTENDED_RELEASE_TABLET | Freq: Once | ORAL | Status: AC
  Administered 2014-11-02: 40 meq via ORAL
  Filled 2014-11-02: qty 2

## 2014-11-02 MED ORDER — POTASSIUM CHLORIDE 10 MEQ/100ML IV SOLN
10.0000 meq | INTRAVENOUS | Status: AC
  Administered 2014-11-02 (×3): 10 meq via INTRAVENOUS
  Filled 2014-11-02 (×3): qty 100

## 2014-11-02 MED ORDER — LORAZEPAM 2 MG/ML IJ SOLN
0.5000 mg | Freq: Four times a day (QID) | INTRAMUSCULAR | Status: DC | PRN
  Administered 2014-11-02 – 2014-11-05 (×6): 0.5 mg via INTRAVENOUS
  Filled 2014-11-02 (×7): qty 1

## 2014-11-02 MED ORDER — TRAMADOL HCL 50 MG PO TABS
50.0000 mg | ORAL_TABLET | Freq: Four times a day (QID) | ORAL | Status: DC | PRN
  Administered 2014-11-02 – 2014-11-06 (×6): 50 mg via ORAL
  Filled 2014-11-02 (×7): qty 1

## 2014-11-02 MED ORDER — FUROSEMIDE 10 MG/ML IJ SOLN
20.0000 mg | Freq: Once | INTRAMUSCULAR | Status: AC
  Administered 2014-11-02: 20 mg via INTRAVENOUS
  Filled 2014-11-02: qty 2

## 2014-11-02 MED ORDER — CHLORHEXIDINE GLUCONATE 0.12 % MT SOLN
15.0000 mL | Freq: Two times a day (BID) | OROMUCOSAL | Status: DC
  Administered 2014-11-02 – 2014-11-06 (×10): 15 mL via OROMUCOSAL
  Filled 2014-11-02 (×14): qty 15

## 2014-11-02 MED ORDER — IPRATROPIUM-ALBUTEROL 0.5-2.5 (3) MG/3ML IN SOLN
3.0000 mL | Freq: Four times a day (QID) | RESPIRATORY_TRACT | Status: DC
  Administered 2014-11-02 – 2014-11-04 (×10): 3 mL via RESPIRATORY_TRACT
  Filled 2014-11-02 (×9): qty 3

## 2014-11-02 MED ORDER — MORPHINE SULFATE 2 MG/ML IJ SOLN
1.0000 mg | INTRAMUSCULAR | Status: DC | PRN
  Administered 2014-11-02 – 2014-11-07 (×9): 1 mg via INTRAVENOUS
  Filled 2014-11-02 (×10): qty 1

## 2014-11-02 MED ORDER — PANTOPRAZOLE SODIUM 40 MG IV SOLR
40.0000 mg | Freq: Two times a day (BID) | INTRAVENOUS | Status: DC
  Administered 2014-11-02 – 2014-11-06 (×10): 40 mg via INTRAVENOUS
  Filled 2014-11-02 (×14): qty 40

## 2014-11-02 MED ORDER — COLLAGENASE 250 UNIT/GM EX OINT
TOPICAL_OINTMENT | Freq: Every day | CUTANEOUS | Status: DC
  Administered 2014-11-02 – 2014-11-05 (×4): via TOPICAL
  Filled 2014-11-02: qty 30

## 2014-11-02 MED ORDER — AZITHROMYCIN 500 MG IV SOLR
500.0000 mg | INTRAVENOUS | Status: DC
  Administered 2014-11-02 – 2014-11-06 (×5): 500 mg via INTRAVENOUS
  Filled 2014-11-02 (×7): qty 500

## 2014-11-02 NOTE — Progress Notes (Signed)
Pt resting comfortably on venturi mask, not in distress. Not placed on BiPAP at this time. RT will continue to monitor

## 2014-11-02 NOTE — Progress Notes (Signed)
11/02/2014 Rn was also notified about patient 11 beat and a 9 beat run of SVT between 1500 and 1600. Dr Sunnie Nielsen was made aware [facility] RN.

## 2014-11-02 NOTE — Progress Notes (Signed)
11/02/2014 Patient had a foley place By Juanito DoomLynn McGrath (NT) and observe by Lovie MacadamiaNadine Damita Eppard (RN) at 0930 she had clear yellow urine. [facility] LPNadine Trevone Prestwood RN.

## 2014-11-02 NOTE — Progress Notes (Signed)
Utilization Review Completed.  

## 2014-11-02 NOTE — Progress Notes (Addendum)
11/02/2014 Central monitoring called patient had 21 runs of Vtach at 1300. Nurse reviewed strip it looked SVT. Dr Sunnie Nielsenegalado was notified and made aware. Order was given for lopressor. [facility], InNadine Taliah Porche RN.

## 2014-11-02 NOTE — Progress Notes (Signed)
[facility] PROGRESS NOTE  Beverly Black ZOX:[id number] DOB: [birth year] DOA: 11/01/2014 PCP: [name], MD  Assessment/Plan: 1-Acute on chronic hypoxic Respiratory failure;  Chest x ray showed bilateral PNA.  Patient on VM. I have order BIPAP PRN.  Will Add nebulizer treatments.  Will give one time dose of IV lasix.  Will repeat Chest x ray.  Continue with IV antibiotics.   2-Health care Associated PNA;  Continue with vancomycin and Fortaz.   3-Right humerus fracture Right arm in sling, patient has seen orthopedics as outpatient, and they recommended conservative management.  3-AKI; Repeat labs pending.  NSL to avoid volume overload.   4-DVT prophylaxis Lovenox  History of diastolic heart failure NSL fluids. Monitor volume status. One time dose of laxis.   Code Status: DNR Family Communication: none at bedside.  Disposition Plan: Remain in the step down.    Consultants:  none  Procedures:  none  Antibiotics:  Fortaz 6-26  Vancomycin 6-26  HPI/Subjective: Feels mouths is dry. Denies worsening dyspnea. Relates mild cough.   Objective: Filed Vitals:   11/02/14 0443  BP: 116/86  Pulse: 85  Temp: 97.2 F (36.2 C)  Resp: 20    Intake/Output Summary (Last 24 hours) at 11/02/14 0728 Last data filed at 11/02/14 0600  Gross per 24 hour  Intake   2595 ml  Output    200 ml  Net   2395 ml   Filed Weights   11/01/14 1120 11/01/14 1457 11/02/14 0443  Weight: 53.8 kg (118 lb 9.7 oz) 50.5 kg (111 lb 5.3 oz) 52 kg (114 lb 10.2 oz)    Exam:   General:  Alert in no distress. With VM  Cardiovascular: S 1, S 2 RRR  Respiratory: Bilateral ronchus, no increase work of breathing.   Abdomen: BS present, soft, nt  Musculoskeletal: no edema.   Data Reviewed: Basic Metabolic Panel:  Recent Labs Lab 11/01/14 1050  NA 143  K 4.2  CL 109  CO2 25  GLUCOSE 109*  BUN 56*  CREATININE 1.10*  CALCIUM 7.3*   Liver Function Tests:  Recent  Labs Lab 11/01/14 1050  AST 45*  ALT 24  ALKPHOS 108  BILITOT 0.6  PROT 4.8*  ALBUMIN 1.3*   No results for input(s): LIPASE, AMYLASE in the last 168 hours. No results for input(s): AMMONIA in the last 168 hours. CBC:  Recent Labs Lab 11/01/14 1050  WBC 15.9*  NEUTROABS 13.7*  HGB 9.7*  HCT 29.5*  MCV 86.0  PLT 592*   Cardiac Enzymes: No results for input(s): CKTOTAL, CKMB, CKMBINDEX, TROPONINI in the last 168 hours. BNP (last 3 results)  Recent Labs  11/01/14 1050  BNP 282.4*    ProBNP (last 3 results) No results for input(s): PROBNP in the last 8760 hours.  CBG: No results for input(s): GLUCAP in the last 168 hours.  Recent Results (from the past 240 hour(s))  MRSA PCR Screening     Status: None   Collection Time: 11/01/14  7:35 PM  Result Value Ref Range Status   MRSA by PCR NEGATIVE NEGATIVE Final    Comment:        The GeneXpert MRSA Assay (FDA approved for NASAL specimens only), is one component of Black comprehensive MRSA colonization surveillance program. It is not intended to diagnose MRSA infection nor to guide or monitor treatment for MRSA infections.      Studies: Dg Chest Port 1 View  11/01/2014   CLINICAL DATA:  Low oxygen saturations. [place] Seat  Black few weeks ago. Fracture of the right shoulder. This morning oxygen in the 60s on 4 L.  EXAM: PORTABLE CHEST - 1 VIEW  COMPARISON:  10/09/2008  FINDINGS: Heart size is normal. There patchy infiltrates in lower lobes bilaterally, right greater than left. No pulmonary edema.  Significant displacement of right humeral neck fracture noted.  IMPRESSION: Bilateral lower lobe infiltrates, right greater than left.   Electronically Signed   By: Norva PavlovElizabeth  Brown M.D.   On: 11/01/2014 10:40    Scheduled Meds: . aspirin  81 mg Oral Daily  . cefTAZidime (FORTAZ)  IV  2 g Intravenous Q12H  . docusate sodium  100 mg Oral BID  . enoxaparin (LOVENOX) injection  40 mg Subcutaneous Q24H  . escitalopram  10 mg Oral Daily   . ezetimibe  10 mg Oral Daily  . feeding supplement (ENSURE ENLIVE)  237 mL Oral Daily  . mirtazapine  30 mg Oral QHS  . senna-docusate  1 tablet Oral QHS  . vancomycin  750 mg Intravenous Q24H   Continuous Infusions:   Active Problems:   Humerus fracture   HCAP (healthcare-associated pneumonia)   Pressure ulcer    Time spent: 35 minutes.     Hartley BarefootRegalado, Beverly Black  [facility] Pager [phone] If 7PM-7AM, please contact night-coverage at [email or web address], password [email or web address] 11/02/2014, 7:28 AM  LOS: 1 day

## 2014-11-02 NOTE — Consult Note (Addendum)
WOC wound consult note Reason for Consult: Consult requested for buttocks/sacrum Wound type: Patchy areas of partial thickness skin loss across bilat buttocks and sacrum.  Appearance is consistent with moisture associated skin damage. (MASD)  2 areas have evolved into unstageable wounds.  Pt admits to frequent urinary incontinence prior to admission and states she wears pull on briefs when she is not in the hospital. Pressure Ulcer POA: Yes Measurement: Affected area of MASD is 9X5 cm with multiple areas of red moist partail thickness skin loss. 2 unstageable wounds are .3X.3cm and .4X.4 cm, 100% yellow slough with small amt yellow drainage, no odor. Dressing procedure/placement/frequency: Santyl ointment to chemically debride nonviable tissue to unstageable areas. Foam dressing to protect and repel moisture.  Discussed plan of care with patient and she verbalized understanding. Please re-consult if further assistance is needed.  Thank-you,  Cammie Mcgeeawn [place] MSN, RN, CWOCN, HesterWCN-AP, CNS [phone]

## 2014-11-02 NOTE — Consult Note (Signed)
Consultation Note Date: 11/02/2014   Patient Name: Beverly Black  DOB: [birth year]  MRN: [id number]  Age / Sex: 81 y.o., female   PCP: Beverly Hatch, MD Referring Physician: Alba Cory, MD  Reason for Consultation: Establishing goals of care and Pain control  Palliative Care Assessment and Plan Summary of Established Goals of Care and Medical Treatment Preferences    Palliative Care Discussion Held Today:   Beverly Black is not very conversant today and complaining about her foley catheter - yells out and says she feels like she is wetting the bed and it is burning when she urinates although bed assessed and was dry. She gave me brief history of recent fall and arm fracture with move to [employer] where she felt "I was doing okay." Although she has had oxygenation problems, decreased appetite, developed decubitus ulcers, cough. Now being hospitalized and treated for bilateral pneumonia. Her main goal is to try and get back home although she acknowledges this is unlikely to be able to happen to keep her safe. I offered support.   I spoke with daughter, Beverly Black, via telephone and she is aware of how ill her mother is and has been happy with the care and communication from the hospital and caregivers. Beverly Black acknowledges her mothers health decline and even that she is worried she may die. I explained that she could decline at any time but for now she is tolerating oxygen and we are supporting with antibiotics as well. We did discuss that she is declining and this may happen soon but I do not feel strongly this is today. Beverly Black is concerned as she has a trip planned in a couple days that is very important and a ritual for her family to honor a son that has passed and she is struggling with leaving for this trip. I encouraged her to discuss with her siblings what is right for her. I will continue to discuss their goals with patient and family.    Contacts/Participants in  Discussion: Primary Decision Maker: Patient and then her children - no specified HCPOA   Goals of Care/Code Status/Advance Care Planning:   Code Status: DNR  Symptom Management:   Bowel regimen: Continue Senokot-S qhs.   Pain: RN getting another sling for her arm. I would favor low dose Vicodin 5-325 mg 1-2 tablets every 6 hours prn. Morphine 1 mg every 4 hours severe pain.   Dyspnea: Continue supportive care per primary team - antibiotics/oxygen. Utilize morphine prn if transition to comfort care.   Psycho-social/Spiritual:   Support System: Family supportive. Daughters nearby and son planning coming from [place].   Prognosis: Unable to determine but given recent events and decline likely < 6 months  Discharge Planning:  To be determined       Chief Complaint/History of Present Illness: 81 year old female who has a past medical history of Hypertension; Hyperlipidemia; Stroke (10/2008); Aneurysm; and Osteoporosis. Today patient was brought to the hospital from skilled facility after patient was found to have low O2 sats of 60% on 4 L of oxygen. Patient was sent for further evaluation. Patient recently had a fall with a humerus fracture and was placed in the rehabilitation facility for strengthening earlier this month. Patient was started on oxygen 3 weeks ago after she was discharged from the [facility] ED as per family. Chest x-ray showed bilateral infiltrates right more than left. And patient started on vancomycin and Ceptaz for healthcare associated pneumonia.   Primary Diagnoses  Present  on Admission:  . HCAP (healthcare-associated pneumonia) . Humerus fracture  Palliative Review of Systems:   + gen pain, + fatigue   I have reviewed the medical record, interviewed the patient and family, and examined the patient. The following aspects are pertinent.  Past Medical History  Diagnosis Date  . Hypertension   . Hyperlipidemia   . Stroke 10/2008  . Aneurysm     of the eye  .  Osteoporosis    History   Social History  . Marital Status: Widowed    Spouse Name: N/A  . Number of Children: 3  . Years of Education: HS   Occupational History  . Retired    Social History Main Topics  . Smoking status: Current Every Day Smoker -- 0.50 packs/day for 50 years  . Smokeless tobacco: Not on file  . Alcohol Use: No  . Drug Use: No  . Sexual Activity: Not on file   Other Topics Concern  . None   Social History Narrative   Patient lives at home alone.   Caffeine Use: 4-5 cups daily   Family History  Problem Relation Age of Onset  . Stroke Father   . Heart failure Mother    Scheduled Meds: . antiseptic oral rinse  7 mL Mouth Rinse q12n4p  . aspirin  81 mg Oral Daily  . azithromycin  500 mg Intravenous Q24H  . cefTAZidime (FORTAZ)  IV  2 g Intravenous Q12H  . chlorhexidine  15 mL Mouth Rinse BID  . collagenase   Topical Daily  . docusate sodium  100 mg Oral BID  . enoxaparin (LOVENOX) injection  40 mg Subcutaneous Q24H  . escitalopram  10 mg Oral Daily  . ezetimibe  10 mg Oral Daily  . feeding supplement (ENSURE ENLIVE)  237 mL Oral Daily  . ipratropium-albuterol  3 mL Nebulization Q6H  . metoprolol  5 mg Intravenous 3 times per day  . mirtazapine  30 mg Oral QHS  . pantoprazole (PROTONIX) IV  40 mg Intravenous Q12H  . potassium chloride  10 mEq Intravenous Q1 Hr x 3  . senna-docusate  1 tablet Oral QHS  . vancomycin  750 mg Intravenous Q24H   Continuous Infusions:  PRN Meds:.albuterol, LORazepam, morphine injection, ondansetron **OR** ondansetron (ZOFRAN) IV, traMADol Medications Prior to Admission:  Prior to Admission medications   Medication Sig Start Date End Date Taking? Authorizing Provider  alendronate (FOSAMAX) 70 MG tablet Take 1 tablet (70 mg total) by mouth every 7 (seven) days. Take with a full glass of water on an empty stomach. 07/16/12  Yes Beverly HatchKatherine E Tabori, MD  amLODipine (NORVASC) 10 MG tablet TAKE 1 TABLET AT BEDTIME 07/27/14  Yes  Beverly HatchKatherine E Tabori, MD  aspirin 81 MG tablet Take 81 mg by mouth daily.     Yes Historical Provider, MD  atenolol (TENORMIN) 50 MG tablet TAKE 1 TABLET TWICE A DAY 05/28/14  Yes Beverly HatchKatherine E Tabori, MD  atorvastatin (LIPITOR) 40 MG tablet TAKE 1 TABLET DAILY 10/12/14  Yes Beverly HatchKatherine E Tabori, MD  cholecalciferol (VITAMIN D) 1000 UNITS tablet Take 1,000 Units by mouth daily.   Yes Historical Provider, MD  docusate sodium (COLACE) 100 MG capsule Take 100 mg by mouth 2 (two) times daily.   Yes Historical Provider, MD  ENSURE (ENSURE) Take 237 mLs by mouth daily. At 2 pm   Yes Historical Provider, MD  escitalopram (LEXAPRO) 10 MG tablet Take 10 mg by mouth daily.   Yes Historical Provider, MD  fexofenadine (ALLEGRA) 180 MG tablet Take 180 mg by mouth daily.   Yes Historical Provider, MD  lisinopril (PRINIVIL,ZESTRIL) 20 MG tablet TAKE 1 TABLET DAILY Patient taking differently: TAKE 1 TABLET BY MOUTH DAILY 07/27/14  Yes Beverly Hatch, MD  LORazepam (ATIVAN) 0.5 MG tablet TAKE 1 TABLET BY MOUTH THREE TIMES DAILY AS NEEDED 10/02/14  Yes Beverly Hatch, MD  mirtazapine (REMERON) 30 MG tablet TAKE 1 TABLET AT BEDTIME 10/19/14  Yes Beverly Hatch, MD  naproxen (NAPROSYN) 500 MG tablet Take 1 tablet (500 mg total) by mouth 2 (two) times daily with a meal. 10/08/14  Yes Eber Hong, MD  PREMARIN 0.3 MG tablet TAKE 1 TABLET DAILY FOR 21 DAYS THEN DO NOT TAKE FOR 7 DAYS 10/10/13  Yes Beverly Hatch, MD  PRESCRIPTION MEDICATION Pt to take 1 two times a day to promote weight gain. MAGIC CUP   Yes Historical Provider, MD  sennosides-docusate sodium (SENOKOT-S) 8.6-50 MG tablet Take 1 tablet by mouth at bedtime.   Yes Historical Provider, MD  traMADol (ULTRAM) 50 MG tablet Take 1 tablet (50 mg total) by mouth every 6 (six) hours as needed. Patient taking differently: Take 50 mg by mouth every 6 (six) hours as needed for moderate pain.  10/08/14  Yes Eber Hong, MD  ZETIA 10 MG tablet TAKE 1 TABLET DAILY  05/22/14  Yes Beverly Hatch, MD   Allergies  Allergen Reactions  . Sulfa Antibiotics     Cant remember reaction   CBC:    Component Value Date/Time   WBC 16.1* 11/02/2014 0901   HGB 9.4* 11/02/2014 0901   HCT 29.8* 11/02/2014 0901   PLT 510* 11/02/2014 0901   MCV 87.4 11/02/2014 0901   NEUTROABS 13.7* 11/01/2014 1050   LYMPHSABS 1.1 11/01/2014 1050   MONOABS 1.1* 11/01/2014 1050   EOSABS 0.0 11/01/2014 1050   BASOSABS 0.0 11/01/2014 1050   Comprehensive Metabolic Panel:    Component Value Date/Time   NA 147* 11/02/2014 0901   K 3.3* 11/02/2014 0901   CL 119* 11/02/2014 0901   CO2 22 11/02/2014 0901   BUN 41* 11/02/2014 0901   CREATININE 0.84 11/02/2014 0901   GLUCOSE 75 11/02/2014 0901   CALCIUM 6.8* 11/02/2014 0901   AST 45* 11/01/2014 1050   ALT 24 11/01/2014 1050   ALKPHOS 108 11/01/2014 1050   BILITOT 0.6 11/01/2014 1050   PROT 4.8* 11/01/2014 1050   ALBUMIN 1.3* 11/01/2014 1050    Physical Exam:  Vital Signs: BP 144/51 mmHg  Pulse 78  Temp(Src) 98.1 F (36.7 C) (Oral)  Resp 16  Ht 5\' 4"  (1.626 m)  Wt 52 kg (114 lb 10.2 oz)  BMI 19.67 kg/m2  SpO2 95% SpO2: SpO2: 95 % O2 Device: O2 Device: Venturi Mask O2 Flow Rate: O2 Flow Rate (L/min): 4 L/min Intake/output summary:  Intake/Output Summary (Last 24 hours) at 11/02/14 1358 Last data filed at 11/02/14 1005  Gross per 24 hour  Intake   2510 ml  Output    200 ml  Net   2310 ml   LBM:  6/27 Baseline Weight: Weight: 53.8 kg (118 lb 9.7 oz) Most recent weight: Weight: 52 kg (114 lb 10.2 oz)  Exam Findings:  General: NAD, lying in bed, thin, frail HEENT: Temporal muscle wasting, oral care done CVS: Tachycardic  Resp: Somewhat labored at times but not consistently, rhonchi Abd: Soft, NT, ND Skin: Thin, frail, warm to touch Neuro: Awake, alert, oriented x 3 - seems somewhat  confused about why she is here as she did not remember she has pneumonia             Palliative Performance Scale: 30 %  at best                Additional Data Reviewed: Recent Labs     11/01/14  1050  11/02/14  0901  WBC  15.9*  16.1*  HGB  9.7*  9.4*  PLT  592*  510*  NA  143  147*  BUN  56*  41*  CREATININE  1.10*  0.84     Time In: 1245 Time Out: 1400 Total Time Prolonged Time Billed  no     Greater than 50%  of this time was spent counseling and coordinating care related to the above assessment and plan.   Yong Channel, NP Palliative Medicine Team Pager # [phone] (M-F 8a-5p) Team Phone # [phone] (Nights/Weekends)  11/02/2014, 1:37 PM

## 2014-11-02 NOTE — Progress Notes (Signed)
  Echocardiogram 2D Echocardiogram has been performed.  Beverly SavoyCasey N [name] 11/02/2014, 11:41 AM

## 2014-11-02 NOTE — Progress Notes (Signed)
11/02/2014 Patient c/o burning when she void, patient assess vaginal area and check site of foley. No redness, or wound noted. Notified MD and order to send down a urine sample. Lovie MacadamiaNadine Sabirin Baray (RN).

## 2014-11-03 ENCOUNTER — Inpatient Hospital Stay (HOSPITAL_COMMUNITY): Payer: Medicare Other

## 2014-11-03 DIAGNOSIS — R52 Pain, unspecified: Secondary | ICD-10-CM

## 2014-11-03 DIAGNOSIS — M7989 Other specified soft tissue disorders: Secondary | ICD-10-CM

## 2014-11-03 DIAGNOSIS — S42309D Unspecified fracture of shaft of humerus, unspecified arm, subsequent encounter for fracture with routine healing: Secondary | ICD-10-CM

## 2014-11-03 LAB — CBC
HEMATOCRIT: 26.8 % — AB (ref 36.0–46.0)
Hemoglobin: 8.5 g/dL — ABNORMAL LOW (ref 12.0–15.0)
MCH: 27.7 pg (ref 26.0–34.0)
MCHC: 31.7 g/dL (ref 30.0–36.0)
MCV: 87.3 fL (ref 78.0–100.0)
Platelets: 453 10*3/uL — ABNORMAL HIGH (ref 150–400)
RBC: 3.07 MIL/uL — AB (ref 3.87–5.11)
RDW: 15.4 % (ref 11.5–15.5)
WBC: 12.1 10*3/uL — AB (ref 4.0–10.5)

## 2014-11-03 LAB — BASIC METABOLIC PANEL
Anion gap: 5 (ref 5–15)
BUN: 30 mg/dL — AB (ref 6–20)
CO2: 22 mmol/L (ref 22–32)
CREATININE: 0.77 mg/dL (ref 0.44–1.00)
Calcium: 6.9 mg/dL — ABNORMAL LOW (ref 8.9–10.3)
Chloride: 117 mmol/L — ABNORMAL HIGH (ref 101–111)
GFR calc non Af Amer: >60 mL/min (ref >60)
Glucose, Bld: 104 mg/dL — ABNORMAL HIGH (ref 65–99)
Potassium: 3.7 mmol/L (ref 3.5–5.1)
Sodium: 144 mmol/L (ref 135–145)

## 2014-11-03 LAB — LEGIONELLA ANTIGEN, URINE

## 2014-11-03 MED ORDER — ENSURE ENLIVE PO LIQD
237.0000 mL | Freq: Two times a day (BID) | ORAL | Status: DC
  Administered 2014-11-04 – 2014-11-05 (×3): 237 mL via ORAL

## 2014-11-03 NOTE — Progress Notes (Signed)
Patient is currently on 50% venti mask and is resting comfortably. Patient is in no distress at this time. Vitals are stable and as follows: HR 80, SPO2 96, RR 19, and BP 139/49. BIPAP is not needed at this time. Will continue to monitor.

## 2014-11-03 NOTE — Care Management (Signed)
Important Message  Patient Details  Name: Beverly Black MRN: [id number] Date of Birth: 03/17/1929   Medicare Important Message Given:  Yes-second notification given    [name] 11/03/2014, 3:27 PM

## 2014-11-03 NOTE — Progress Notes (Addendum)
[facility] PROGRESS NOTE  Beverly Black ZOX:[id number] DOB: 12-13-1928 DOA: 11/01/2014 PCP: [name], MD  Assessment/Plan: 1-Acute on chronic hypoxic Respiratory failure;  Chest x ray showed bilateral PNA.  Patient on VM. I have order BIPAP PRN.  Continue with nebulizer treatments.  Repeated Chest x ray 6-27 with worsening infiltrates  Continue with IV antibiotics. WBC trending down.  Appreciate palliative care. They will continue to follow.   2-Health care Associated PNA;  Continue with vancomycin and Fortaz.   3-Right humerus fracture Right arm in sling, patient has seen orthopedics as outpatient, and they recommended conservative management.  3-AKI; Repeat labs pending.  NSL to avoid volume overload.   4-DVT prophylaxis Lovenox  Abdominal pian; improved after BM/   History of diastolic heart failure NSL fluids. Monitor volume status. Hold on lasix today.   Code Status: DNR Family Communication:daughter  Disposition Plan: Remain in the step down.    Consultants:  Palliative care team.   Procedures:  none  Antibiotics:  Fortaz 6-26  Vancomycin 6-26  HPI/Subjective: Breathing better. Coughing some. Abdominal pain better after having a BM   Objective: Filed Vitals:   11/03/14 0807  BP: 143/57  Pulse: 102  Temp: 98.4 F (36.9 C)  Resp: 14    Intake/Output Summary (Last 24 hours) at 11/03/14 1344 Last data filed at 11/03/14 1100  Gross per 24 hour  Intake    600 ml  Output   1050 ml  Net   -450 ml   Filed Weights   11/01/14 1120 11/01/14 1457 11/02/14 0443  Weight: 53.8 kg (118 lb 9.7 oz) 50.5 kg (111 lb 5.3 oz) 52 kg (114 lb 10.2 oz)    Exam:   General:  Alert in no distress. With VM  Cardiovascular: S 1, S 2 RRR  Respiratory: Bilateral ronchus, no increase work of breathing.   Abdomen: BS present, soft, nt  Musculoskeletal: no edema.   Data Reviewed: Basic Metabolic Panel:  Recent Labs Lab 11/01/14 1050  11/02/14 0901 11/03/14 0407  NA 143 147* 144  K 4.2 3.3* 3.7  CL 109 119* 117*  CO2 GLUCOSE 109* 75 104*  BUN 56* 41* 30*  CREATININE 1.10* 0.84 0.77  CALCIUM 7.3* 6.8* 6.9*  MG  --  2.3  --    Liver Function Tests:  Recent Labs Lab 11/01/14 1050  AST 45*  ALT 24  ALKPHOS 108  BILITOT 0.6  PROT 4.8*  ALBUMIN 1.3*   No results for input(s): LIPASE, AMYLASE in the last 168 hours. No results for input(s): AMMONIA in the last 168 hours. CBC:  Recent Labs Lab 11/01/14 1050 11/02/14 0901 11/03/14 0407  WBC 15.9* 16.1* 12.1*  NEUTROABS 13.7*  --   --   HGB 9.7* 9.4* 8.5*  HCT 29.5* 29.8* 26.8*  MCV 86.0 87.4 87.3  PLT 592* 510* 453*   Cardiac Enzymes: No results for input(s): CKTOTAL, CKMB, CKMBINDEX, TROPONINI in the last 168 hours. BNP (last 3 results)  Recent Labs  11/01/14 1050  BNP 282.4*    ProBNP (last 3 results) No results for input(s): PROBNP in the last 8760 hours.  CBG: No results for input(s): GLUCAP in the last 168 hours.  Recent Results (from the past 240 hour(s))  Culture, blood (routine x 2)     Status: None (Preliminary result)   Collection Time: 11/01/14  1:50 PM  Result Value Ref Range Status   Specimen Description BLOOD RIGHT ARM  Final   Special  Requests   Final    BOTTLES DRAWN AEROBIC AND ANAEROBIC BLUE 10CC RED 5CC   Culture NO GROWTH 2 DAYS  Final   Report Status PENDING  Incomplete  Culture, blood (routine x 2)     Status: None (Preliminary result)   Collection Time: 11/01/14  1:55 PM  Result Value Ref Range Status   Specimen Description BLOOD RIGHT HAND  Final   Special Requests BOTTLES DRAWN AEROBIC ONLY 10CC  Final   Culture NO GROWTH 2 DAYS  Final   Report Status PENDING  Incomplete  MRSA PCR Screening     Status: None   Collection Time: 11/01/14  7:35 PM  Result Value Ref Range Status   MRSA by PCR NEGATIVE NEGATIVE Final    Comment:        The GeneXpert MRSA Assay (FDA approved for NASAL  specimens only), is one component of a comprehensive MRSA colonization surveillance program. It is not intended to diagnose MRSA infection nor to guide or monitor treatment for MRSA infections.      Studies: Dg Chest Port 1 View  11/02/2014   CLINICAL DATA:  Hypoxemia  EXAM: PORTABLE CHEST - 1 VIEW  COMPARISON:  Portable chest x-ray of [place], 2016.  FINDINGS: The lungs are well-expanded. Progressive increase in alveolar density has appeared on the right. There is a small right pleural effusion. There is no pneumothorax. On the left the interstitial markings are slightly more conspicuous especially at the lung base. The heart and pulmonary vascularity are normal. A displaced fracture of the right humeral neck is again demonstrated.  IMPRESSION: Progressive airspace opacification on the right most compatible with pneumonia with interval development of a small right pleural effusion. Minimal atelectasis/ interstitial pneumonia at the left lung base.   Electronically Signed   By: David  SwazilandJordan M.D.   On: 11/02/2014 08:58    Scheduled Meds: . antiseptic oral rinse  7 mL Mouth Rinse q12n4p  . aspirin  81 mg Oral Daily  . azithromycin  500 mg Intravenous Q24H  . cefTAZidime (FORTAZ)  IV  2 g Intravenous Q12H  . chlorhexidine  15 mL Mouth Rinse BID  . collagenase   Topical Daily  . docusate sodium  100 mg Oral BID  . enoxaparin (LOVENOX) injection  40 mg Subcutaneous Q24H  . escitalopram  10 mg Oral Daily  . ezetimibe  10 mg Oral Daily  . feeding supplement (ENSURE ENLIVE)  237 mL Oral BID BM  . ipratropium-albuterol  3 mL Nebulization Q6H  . metoprolol  5 mg Intravenous 3 times per day  . mirtazapine  30 mg Oral QHS  . pantoprazole (PROTONIX) IV  40 mg Intravenous Q12H  . senna-docusate  1 tablet Oral QHS  . vancomycin  750 mg Intravenous Q24H   Continuous Infusions:   Active Problems:   Humerus fracture   HCAP (healthcare-associated pneumonia)   Pressure ulcer   Palliative care  encounter    Time spent: 35 minutes.     Hartley BarefootRegalado, Belkys A  [facility] Pager [phone] If 7PM-7AM, please contact night-coverage at [email or web address], password [email or web address] 11/03/2014, 1:44 PM  LOS: 2 days

## 2014-11-03 NOTE — Progress Notes (Signed)
Initial Nutrition Assessment  DOCUMENTATION CODES:  Not applicable  INTERVENTION:   Continue Ensure Enlive po BID, each supplement provides 350 kcal and 20 grams of protein  NUTRITION DIAGNOSIS:  Inadequate oral intake related to lethargy/confusion as evidenced by meal completion < 25%  GOAL:  Patient will meet greater than or equal to 90% of their needs  MONITOR:  PO intake, Supplement acceptance, Labs, Weight trends, I & O's  REASON FOR ASSESSMENT:  Low Braden   ASSESSMENT: 81 year old Female with PMH of HTN, stroke; brought to the hospital from skilled facility after being found to have low O2 sats of 60% on 4 L of oxygen; chest x-ray showed bilateral infiltrates.  Pt currently on venturi-mask.  RD unable to obtain nutrition hx of perform Nutrition Focused Physical Exam.  Breakfast meal untouched on tray table.  CWOCN note 6/27 reviewed.  Low braden score places patient at risk for further skin breakdown.  Palliative Care Team following.  Height:  Ht Readings from Last 1 Encounters:  11/01/14 5\' 4"  (1.626 m)    Weight:  Wt Readings from Last 1 Encounters:  11/02/14 114 lb 10.2 oz (52 kg)    Ideal Body Weight:  54.5 kg  Wt Readings from Last 10 Encounters:  11/02/14 114 lb 10.2 oz (52 kg)  05/06/14 118 lb 9.6 oz (53.797 kg)  01/20/14 120 lb 2 oz (54.488 kg)  11/25/13 120 lb (54.432 kg)  10/16/13 120 lb 8 oz (54.658 kg)  04/16/13 122 lb 9.6 oz (55.611 kg)  09/26/12 120 lb 6.4 oz (54.613 kg)  12/07/11 120 lb (54.432 kg)  06/05/11 114 lb 9.6 oz (51.982 kg)  04/03/11 116 lb 9.6 oz (52.889 kg)    BMI:  Body mass index is 19.67 kg/(m^2).  Estimated Nutritional Needs:  Kcal:  1200-1400  Protein:  60-70 gm  Fluid:  >/= 1.5 L  Skin:  Wound (see comment) (Unstageable to sacrum)  Diet Order:  Diet Heart Room service appropriate?: Yes; Fluid consistency:: Thin  EDUCATION NEEDS:  No education needs identified at this time   Intake/Output Summary  (Last 24 hours) at 11/03/14 1042 Last data filed at 11/03/14 0900  Gross per 24 hour  Intake    360 ml  Output   1050 ml  Net   -690 ml    Last BM:  6/27  Maureen ChattersKatie Lile Mccurley, RD, LDN Pager #: [phone] After-Hours Pager #: [phone]

## 2014-11-03 NOTE — Progress Notes (Signed)
VASCULAR LAB PRELIMINARY  PRELIMINARY  PRELIMINARY  PRELIMINARY  Bilateral lower extremity venous duplex completed.    Preliminary report:  Bilateral:  No evidence of DVT, superficial thrombosis, or Baker's Cyst.    Hiral Lukasiewicz, RVS 11/03/2014, 3:34 PM

## 2014-11-03 NOTE — Progress Notes (Signed)
VASCULAR LAB PRELIMINARY  PRELIMINARY  PRELIMINARY  PRELIMINARY  Right upper extremity venous duplex completed.    Preliminary report:  Right ::  No evidence of DVT or superficial thrombosis.    Alliah Boulanger, RVS 11/03/2014, 3:59 PM

## 2014-11-03 NOTE — Progress Notes (Signed)
Daily Progress Note   Patient Name: Beverly Black       Date: 11/03/2014 DOB: 01/29/1929  Age: 81 y.o. MRN#: [id number] Attending Physician: Alba CoryBelkys A Regalado, MD Primary Care Physician: Neena RhymesKatherine Tabori, MD Admit Date: 11/01/2014  Reason for Consultation/Follow-up: Establishing goals of care  Subjective:     Beverly Black is feeling a little better today and appears to be more comfortable (less moaning and smiling more). She appears more alert today and slightly improved. Unfortunately unable to titrate oxygen yet. She reiterates her concern of not being able to return home again. I reassured her. She speaks highly of her children and this subject clearly gives her joy. She is unable to remember our conversation yesterday and unable to remember that she has pneumonia (although I told her that a few minutes ago). She can tell me who she is and that she is in the hospital. I communicated these finding to her daughter, Beverly Black and they still hope for improvement. However she was receptive when I explained that I fear there will be many barriers in the future and may be some decisions and discussions will need to be had amongst their family (i.e. Poor nutrition and the effect on her pressure ulcers healing, frailty, etc). I will continue to follow and she tells me that her sister, Beverly Black, will be expecting my calls for follow up as Beverly Black will be out of town.    Length of Stay: 2 days  Current Medications: Scheduled Meds:  . antiseptic oral rinse  7 mL Mouth Rinse q12n4p  . aspirin  81 mg Oral Daily  . azithromycin  500 mg Intravenous Q24H  . cefTAZidime (FORTAZ)  IV  2 g Intravenous Q12H  . chlorhexidine  15 mL Mouth Rinse BID  . collagenase   Topical Daily  . docusate sodium  100 mg Oral BID  . enoxaparin (LOVENOX) injection  40 mg Subcutaneous Q24H  . escitalopram  10 mg Oral Daily  . ezetimibe  10 mg Oral Daily  . feeding supplement (ENSURE ENLIVE)  237 mL Oral BID BM  .  ipratropium-albuterol  3 mL Nebulization Q6H  . metoprolol  5 mg Intravenous 3 times per day  . mirtazapine  30 mg Oral QHS  . pantoprazole (PROTONIX) IV  40 mg Intravenous Q12H  . senna-docusate  1 tablet Oral QHS  . vancomycin  750 mg Intravenous Q24H    Continuous Infusions:    PRN Meds: albuterol, LORazepam, morphine injection, ondansetron **OR** ondansetron (ZOFRAN) IV, traMADol  Palliative Performance Scale: 30%     Vital Signs: BP 143/57 mmHg  Pulse 102  Temp(Src) 98.4 F (36.9 C) (Oral)  Resp 14  Ht 5\' 4"  (1.626 m)  Wt 52 kg (114 lb 10.2 oz)  BMI 19.67 kg/m2  SpO2 93% SpO2: SpO2: 93 % O2 Device: O2 Device: Venturi Mask O2 Flow Rate: O2 Flow Rate (L/min): 12 L/min  Intake/output summary:  Intake/Output Summary (Last 24 hours) at 11/03/14 1355 Last data filed at 11/03/14 1100  Gross per 24 hour  Intake    450 ml  Output   1050 ml  Net   -600 ml   LBM: Last BM Date: 11/02/14 Baseline Weight: Weight: 53.8 kg (118 lb 9.7 oz) Most recent weight: Weight: 52 kg (114 lb 10.2 oz)  Physical Exam: General: NAD, lying in bed, thin, frail HEENT: Temporal muscle wasting CVS: Tachycardic  Resp: No labored breathing today Abd: Soft, NT, ND Skin: Thin, frail, warm to  touch Neuro: Awake, alert, oriented x 2 - seems somewhat confused about why she is here as she did not remember she has pneumonia    Additional Data Reviewed: Recent Labs     11/02/14  0901  11/03/14  0407  WBC  16.1*  12.1*  HGB  9.4*  8.5*  PLT  510*  453*  NA  147*  144  BUN  41*  30*  CREATININE  0.84  0.77     Problem List:  Patient Active Problem List   Diagnosis Date Noted  . Pressure ulcer 11/02/2014  . Palliative care encounter 11/02/2014  . HCAP (healthcare-associated pneumonia) 11/01/2014  . Leukocytosis 10/15/2014  . Humerus fracture 10/14/2014  . Poor balance 11/25/2013  . History of stroke 11/25/2013  . 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 Myalgia 09/05/2012  .  Disequilibrium 09/05/2012  . Hypertension, essential 09/05/2012  . 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 Mobility impaired 07/15/2012  . Physical exam, routine 12/07/2011  . Cerumen impaction 04/04/2011  . Trapezius muscle spasm 04/04/2011  . HTN (hypertension) 11/30/2010  . Hyperlipidemia 11/30/2010  . CVA (cerebral infarction) 11/30/2010  . Postmenopausal HRT (hormone replacement therapy) 11/30/2010  . Anxiety 11/30/2010  . Ankle weakness 11/30/2010     Palliative Care Assessment & Plan    Code Status:  DNR  Goals of Care:  Hope for reversal of pneumonia and improvement.   3. Symptom Management:  Bowel regimen: Continue Senokot-S qhs.  Pain: I would favor low dose Vicodin 5-325 mg 1-2 tablets every 6 hours prn. Morphine 1 mg every 4 hours severe pain.   Dyspnea: Continue supportive care per primary team - antibiotics/oxygen. Utilize morphine prn if transition to comfort care.   5. Prognosis: < 6 months very likely  5. Discharge Planning: Skilled Nursing Facility for rehab with Palliative care service follow-up and likely transition to long term care   Thank you for allowing the Palliative Medicine Team to assist in the care of this patient.   Time In: 1000 Time Out: 1030 Total Time Prolonged Time Billed  no     Greater than 50%  of this time was spent counseling and coordinating care related to the above assessment and plan.     Yong Channel, NP Palliative Medicine Team Pager # [phone] (M-F 8a-5p) Team Phone # [phone] (Nights/Weekends)  11/03/2014, 1:55 PM

## 2014-11-04 ENCOUNTER — Inpatient Hospital Stay (HOSPITAL_COMMUNITY): Payer: Medicare Other

## 2014-11-04 LAB — CBC
HEMATOCRIT: 27.6 % — AB (ref 36.0–46.0)
Hemoglobin: 8.6 g/dL — ABNORMAL LOW (ref 12.0–15.0)
MCH: 27.1 pg (ref 26.0–34.0)
MCHC: 31.2 g/dL (ref 30.0–36.0)
MCV: 87.1 fL (ref 78.0–100.0)
Platelets: 412 10*3/uL — ABNORMAL HIGH (ref 150–400)
RBC: 3.17 MIL/uL — ABNORMAL LOW (ref 3.87–5.11)
RDW: 15.4 % (ref 11.5–15.5)
WBC: 10.1 10*3/uL (ref 4.0–10.5)

## 2014-11-04 LAB — BASIC METABOLIC PANEL
Anion gap: 8 (ref 5–15)
BUN: 22 mg/dL — ABNORMAL HIGH (ref 6–20)
CALCIUM: 7.5 mg/dL — AB (ref 8.9–10.3)
CO2: 25 mmol/L (ref 22–32)
Chloride: 115 mmol/L — ABNORMAL HIGH (ref 101–111)
Creatinine, Ser: 0.73 mg/dL (ref 0.44–1.00)
GFR calc Af Amer: >60 mL/min (ref >60)
GFR calc non Af Amer: >60 mL/min (ref >60)
GLUCOSE: 98 mg/dL (ref 65–99)
Potassium: 3.5 mmol/L (ref 3.5–5.1)
Sodium: 148 mmol/L — ABNORMAL HIGH (ref 135–145)

## 2014-11-04 MED ORDER — IPRATROPIUM-ALBUTEROL 0.5-2.5 (3) MG/3ML IN SOLN
3.0000 mL | Freq: Three times a day (TID) | RESPIRATORY_TRACT | Status: DC
  Administered 2014-11-05 – 2014-11-07 (×7): 3 mL via RESPIRATORY_TRACT
  Filled 2014-11-04 (×8): qty 3

## 2014-11-04 MED ORDER — FUROSEMIDE 10 MG/ML IJ SOLN
20.0000 mg | Freq: Once | INTRAMUSCULAR | Status: AC
  Administered 2014-11-04: 20 mg via INTRAVENOUS
  Filled 2014-11-04: qty 2

## 2014-11-04 MED ORDER — ALBUTEROL SULFATE (2.5 MG/3ML) 0.083% IN NEBU: 2.5000 mg | INHALATION_SOLUTION | Freq: Four times a day (QID) | RESPIRATORY_TRACT | Status: DC | PRN

## 2014-11-04 NOTE — Progress Notes (Addendum)
Daily Progress Note   Patient Name: Beverly Black       Date: 11/05/2014 DOB: 11/23/1928  Age: 81 y.o. MRN#: [id number] Attending Physician: Alba CoryBelkys A Regalado, MD Primary Care Physician: Neena RhymesKatherine Tabori, MD Admit Date: 11/01/2014  Subjective:     [name] is up in the chair when I visit and is down to 4L [place] and tolerating this. VSS appear more stable. She is unhappy and miserable being up in the chair and complains of pain from her sacral pressure ulcer - I will order chair pad for her. She says that her arm is not hurting her but her bottom is causing much pain. Otherwise her voice is stronger and speaking more at ease with her respiratory status appears improved. Emotional support provided.   I also updated daughter, Beverly Black, as to her status and they are still hopeful for improvement with pneumonia but are aware of barriers long term. Her other daughter who I have been speaking with, Beverly Black, is out of town. If she remains hospitalized when Beverly Black returns I will try to have family meeting to help specify long term goals and expectations for Ms. Malinowski.    Length of Stay: 4 days  Current Medications: Scheduled Meds:  . antiseptic oral rinse  7 mL Mouth Rinse q12n4p  . aspirin  81 mg Oral Daily  . azithromycin  500 mg Intravenous Q24H  . cefTAZidime (FORTAZ)  IV  2 g Intravenous Q12H  . chlorhexidine  15 mL Mouth Rinse BID  . collagenase   Topical Daily  . docusate sodium  100 mg Oral BID  . enoxaparin (LOVENOX) injection  40 mg Subcutaneous Q24H  . escitalopram  10 mg Oral Daily  . ezetimibe  10 mg Oral Daily  . feeding supplement (ENSURE ENLIVE)  237 mL Oral BID BM  . ipratropium-albuterol  3 mL Nebulization TID  . metoprolol  5 mg Intravenous 3 times per day  . mirtazapine  30 mg Oral QHS  . pantoprazole (PROTONIX) IV  40 mg Intravenous Q12H  . potassium chloride  40 mEq Oral BID  . senna-docusate  1 tablet Oral QHS  . vancomycin  750 mg Intravenous Q24H    Continuous  Infusions:    PRN Meds: albuterol, LORazepam, morphine injection, ondansetron **OR** ondansetron (ZOFRAN) IV, traMADol  Palliative Performance Scale: 30%     Vital Signs: BP 135/59 mmHg  Pulse 85  Temp(Src) 97.9 F (36.6 C) (Oral)  Resp 20  Ht 5\' 4"  (1.626 m)  Wt 52 kg (114 lb 10.2 oz)  BMI 19.67 kg/m2  SpO2 94% SpO2: SpO2: 94 % O2 Device: O2 Device: Nasal Cannula O2 Flow Rate: O2 Flow Rate (L/min): 6 L/min (found pt on 6)  Intake/output summary:  Intake/Output Summary (Last 24 hours) at 11/05/14 0917 Last data filed at 11/05/14 0030  Gross per 24 hour  Intake    890 ml  Output    575 ml  Net    315 ml   LBM: Last BM Date: 11/02/14 Baseline Weight: Weight: 53.8 kg (118 lb 9.7 oz) Most recent weight: Weight: 52 kg (114 lb 10.2 oz)  Physical Exam: General: NAD, lying in bed, thin, frail HEENT: Temporal muscle wasting CVS: RRR Resp: No labored breathing today Abd: Soft, NT, ND Skin: Thin, frail, warm to touch Neuro: Awake, alert, oriented x person/place   Additional Data Reviewed: Recent Labs     11/04/14  0246  11/05/14  0225  WBC  10.1  11.4*  HGB  8.6*  8.8*  PLT  412*  391  NA  148*  145  BUN  22*  19  CREATININE  0.73  0.67     Problem List:  Patient Active Problem List   Diagnosis Date Noted  . Pressure ulcer 11/02/2014  . Palliative care encounter 11/02/2014  . HCAP (healthcare-associated pneumonia) 11/01/2014  . Leukocytosis 10/15/2014  . Humerus fracture 10/14/2014  . Poor balance 11/25/2013  . History of stroke 11/25/2013  . 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 Myalgia 09/05/2012  . Disequilibrium 09/05/2012  . Hypertension, essential 09/05/2012  . 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 Mobility impaired 07/15/2012  . Physical exam, routine 12/07/2011  . Cerumen impaction 04/04/2011  . Trapezius muscle spasm 04/04/2011  . HTN (hypertension) 11/30/2010  . Hyperlipidemia 11/30/2010  . CVA (cerebral infarction) 11/30/2010  .  Postmenopausal HRT (hormone replacement therapy) 11/30/2010  . Anxiety 11/30/2010  . Ankle weakness 11/30/2010     Palliative Care Assessment & Plan    Code Status:  DNR  Goals of Care:  Hope for reversal of pneumonia and improvement.  3. Symptom Management:  Bowel regimen: Continue Senokot-S qhs.  Pain: I would favor low dose Vicodin 5-325 mg 1-2 tablets every 6 hours prn. Morphine 1 mg every 4 hours severe pain.   Dyspnea: Continue supportive care per primary team - antibiotics/oxygen.   5. Prognosis: < 6 months very likely  5. Discharge Planning: Skilled Nursing Facility for rehab with Palliative care service follow-up and likely transition to long term care - family has expressed they do NOT want her to return to [facility]    Thank you for allowing the Palliative Medicine Team to assist in the care of this patient.   Time In: 1520 Time Out: 1540 Total Time Prolonged Time Billed  no     Greater than 50%  of this time was spent counseling and coordinating care related to the above assessment and plan.     Yong Channel, NP Palliative Medicine Team Pager # [phone] (M-F 8a-5p) Team Phone # [phone] (Nights/Weekends)  11/05/2014, 9:17 AM

## 2014-11-04 NOTE — Progress Notes (Addendum)
[facility] PROGRESS NOTE  Lakeena N Spraggins ZOX:[id number] DOB: [birth year] DOA: 11/01/2014 PCP: [name], MD  Assessment/Plan: 1-Acute on chronic hypoxic Respiratory failure;  Chest x ray showed bilateral PNA.  Patient on VM. BIPAP PRN.  Continue with nebulizer treatments.  Repeated Chest x ray 6-29 worsening infiltrates on the right, component of CHF.  Will give one time dose of lasix.  Continue with IV antibiotics, day 3. WBC trending down.  Appreciate palliative care. They will continue to follow.  Still requiring significant amount of oxygen..  Doppler Lower extremities negative for PE.   2-Health care Associated PNA;  Continue with vancomycin and Fortaz. Day 3 antibiotics.  Legionella and strep antigen negative.[name]   3-Right humerus fracture Right arm in sling, patient has seen orthopedics as outpatient, and they recommended conservative management.  3-AKI; Repeat labs pending.  NSL to avoid volume overload.   4-DVT prophylaxis Lovenox  Abdominal pian; improved after BM/ resolved,..  History of diastolic heart failure NSL fluids. Monitor volume status. Hold on lasix today.   Hypernatremia; encourage oral intake.   Code Status: DNR Family Communication: daughter  Disposition Plan: Remain in the step down.    Consultants:  Palliative care team.   Procedures:  none  Antibiotics:  Fortaz 6-26  Vancomycin 6-26  HPI/Subjective: Breathing ok, complaining of buttock pain.    Objective: Filed Vitals:   11/04/14 0800  BP: 140/87  Pulse: 87  Temp: 98.2 F (36.8 C)  Resp: 23    Intake/Output Summary (Last 24 hours) at 11/04/14 0802 Last data filed at 11/04/14 0600  Gross per 24 hour  Intake    950 ml  Output    900 ml  Net     50 ml   Filed Weights   11/01/14 1120 11/01/14 1457 11/02/14 0443  Weight: 53.8 kg (118 lb 9.7 oz) 50.5 kg (111 lb 5.3 oz) 52 kg (114 lb 10.2 oz)    Exam:   General:  Alert in no distress. With  VM  Cardiovascular: S 1, S 2 RRR  Respiratory: Bilateral ronchus, no increase work of breathing.   Abdomen: BS present, soft, nt  Musculoskeletal: no edema.   Data Reviewed: Basic Metabolic Panel:  Recent Labs Lab 11/01/14 1050 11/02/14 0901 11/03/14 0407 11/04/14 0246  NA 143 147* 144 148*  K 4.2 3.3* 3.7 3.5  CL 109 119* 117* 115*  CO2 GLUCOSE 109* 75 104* 98  BUN 56* 41* 30* 22*  CREATININE 1.10* 0.84 0.77 0.73  CALCIUM 7.3* 6.8* 6.9* 7.5*  MG  --  2.3  --   --    Liver Function Tests:  Recent Labs Lab 11/01/14 1050  AST 45*  ALT 24  ALKPHOS 108  BILITOT 0.6  PROT 4.8*  ALBUMIN 1.3*   No results for input(s): LIPASE, AMYLASE in the last 168 hours. No results for input(s): AMMONIA in the last 168 hours. CBC:  Recent Labs Lab 11/01/14 1050 11/02/14 0901 11/03/14 0407 11/04/14 0246  WBC 15.9* 16.1* 12.1* 10.1  NEUTROABS 13.7*  --   --   --   HGB 9.7* 9.4* 8.5* 8.6*  HCT 29.5* 29.8* 26.8* 27.6*  MCV 86.0 87.4 87.3 87.1  PLT 592* 510* 453* 412*   Cardiac Enzymes: No results for input(s): CKTOTAL, CKMB, CKMBINDEX, TROPONINI in the last 168 hours. BNP (last 3 results)  Recent Labs  11/01/14 1050  BNP 282.4*    ProBNP (last 3 results) No results for input(s): PROBNP in  the last 8760 hours.  CBG: No results for input(s): GLUCAP in the last 168 hours.  Recent Results (from the past 240 hour(s))  Culture, blood (routine x 2)     Status: None (Preliminary result)   Collection Time: 11/01/14  1:50 PM  Result Value Ref Range Status   Specimen Description BLOOD RIGHT ARM  Final   Special Requests   Final    BOTTLES DRAWN AEROBIC AND ANAEROBIC BLUE 10CC RED 5CC   Culture NO GROWTH 2 DAYS  Final   Report Status PENDING  Incomplete  Culture, blood (routine x 2)     Status: None (Preliminary result)   Collection Time: 11/01/14  1:55 PM  Result Value Ref Range Status   Specimen Description BLOOD RIGHT HAND  Final   Special Requests  BOTTLES DRAWN AEROBIC ONLY 10CC  Final   Culture NO GROWTH 2 DAYS  Final   Report Status PENDING  Incomplete  MRSA PCR Screening     Status: None   Collection Time: 11/01/14  7:35 PM  Result Value Ref Range Status   MRSA by PCR NEGATIVE NEGATIVE Final    Comment:        The GeneXpert MRSA Assay (FDA approved for NASAL specimens only), is one component of a comprehensive MRSA colonization surveillance program. It is not intended to diagnose MRSA infection nor to guide or monitor treatment for MRSA infections.      Studies: Dg Chest Port 1 View  11/02/2014   CLINICAL DATA:  Hypoxemia  EXAM: PORTABLE CHEST - 1 VIEW  COMPARISON:  Portable chest x-ray of [place] [facility], 2016.  FINDINGS: The lungs are well-expanded. Progressive increase in alveolar density has appeared on the right. There is a small right pleural effusion. There is no pneumothorax. On the left the interstitial markings are slightly more conspicuous especially at the lung base. The heart and pulmonary vascularity are normal. A displaced fracture of the right humeral neck is again demonstrated.  IMPRESSION: Progressive airspace opacification on the right most compatible with pneumonia with interval development of a small right pleural effusion. Minimal atelectasis/ interstitial pneumonia at the left lung base.   Electronically Signed   By: David  SwazilandJordan M.D.   On: 11/02/2014 08:58    Scheduled Meds: . antiseptic oral rinse  7 mL Mouth Rinse q12n4p  . aspirin  81 mg Oral Daily  . azithromycin  500 mg Intravenous Q24H  . cefTAZidime (FORTAZ)  IV  2 g Intravenous Q12H  . chlorhexidine  15 mL Mouth Rinse BID  . collagenase   Topical Daily  . docusate sodium  100 mg Oral BID  . enoxaparin (LOVENOX) injection  40 mg Subcutaneous Q24H  . escitalopram  10 mg Oral Daily  . ezetimibe  10 mg Oral Daily  . feeding supplement (ENSURE ENLIVE)  237 mL Oral BID BM  . ipratropium-albuterol  3 mL Nebulization Q6H  . metoprolol  5 mg Intravenous  3 times per day  . mirtazapine  30 mg Oral QHS  . pantoprazole (PROTONIX) IV  40 mg Intravenous Q12H  . senna-docusate  1 tablet Oral QHS  . vancomycin  750 mg Intravenous Q24H   Continuous Infusions:   Active Problems:   Humerus fracture   HCAP (healthcare-associated pneumonia)   Pressure ulcer   Palliative care encounter    Time spent: 30 minutes.     Hartley BarefootRegalado, Loreley Schwall A  [facility] Pager [phone] If 7PM-7AM, please contact night-coverage at [email or web address], password [email or web address] 11/04/2014, 8:02 AM  LOS: 3 days

## 2014-11-05 ENCOUNTER — Encounter: Payer: Medicare Other | Admitting: Family Medicine

## 2014-11-05 LAB — CBC
HCT: 28 % — ABNORMAL LOW (ref 36.0–46.0)
HEMOGLOBIN: 8.8 g/dL — AB (ref 12.0–15.0)
MCH: 27.2 pg (ref 26.0–34.0)
MCHC: 31.4 g/dL (ref 30.0–36.0)
MCV: 86.7 fL (ref 78.0–100.0)
PLATELETS: 391 10*3/uL (ref 150–400)
RBC: 3.23 MIL/uL — AB (ref 3.87–5.11)
RDW: 15 % (ref 11.5–15.5)
WBC: 11.4 10*3/uL — ABNORMAL HIGH (ref 4.0–10.5)

## 2014-11-05 LAB — BASIC METABOLIC PANEL
Anion gap: 7 (ref 5–15)
BUN: 19 mg/dL (ref 6–20)
CO2: 28 mmol/L (ref 22–32)
CREATININE: 0.67 mg/dL (ref 0.44–1.00)
Calcium: 7.7 mg/dL — ABNORMAL LOW (ref 8.9–10.3)
Chloride: 110 mmol/L (ref 101–111)
GLUCOSE: 96 mg/dL (ref 65–99)
Potassium: 3 mmol/L — ABNORMAL LOW (ref 3.5–5.1)
SODIUM: 145 mmol/L (ref 135–145)

## 2014-11-05 LAB — VANCOMYCIN, TROUGH: Vancomycin Tr: 11 ug/mL (ref 10.0–20.0)

## 2014-11-05 MED ORDER — VANCOMYCIN HCL IN DEXTROSE 1-5 GM/200ML-% IV SOLN
1000.0000 mg | INTRAVENOUS | Status: DC
  Administered 2014-11-05 – 2014-11-06 (×2): 1000 mg via INTRAVENOUS
  Filled 2014-11-05 (×3): qty 200

## 2014-11-05 MED ORDER — POTASSIUM CHLORIDE CRYS ER 20 MEQ PO TBCR
40.0000 meq | EXTENDED_RELEASE_TABLET | Freq: Two times a day (BID) | ORAL | Status: AC
  Administered 2014-11-05 (×2): 40 meq via ORAL
  Filled 2014-11-05 (×2): qty 2

## 2014-11-05 NOTE — Progress Notes (Signed)
[facility] PROGRESS NOTE  Beverly Black ZOX:[id number] DOB: [birth year] DOA: 11/01/2014 PCP: [name], MD  Assessment/Plan: 81 year old female who  has a past medical history of Hypertension; Hyperlipidemia; Stroke (10/2008);  patient was brought to the hospital from skilled facility after patient was found to have low O2 sats of 60% on 4 L of oxygen. Patient recently had a fall with a humerus fracture and was placed in the rehabilitation facility for strengthening earlier this month. Patient was started on oxygen 3 weeks ago after she was discharged from the [facility] ED. Never diagnosed with COPD and has not been using any inhalers.  Chest x-ray showed bilateral infiltrates right more than left. And patient started on vancomycin and Ceptaz for healthcare associated pneumonia. Patient required to be VM 15 L. Palliative care consulted and following. Patient has improved today, she is now on 4 L of oxygen.   1-Acute on chronic hypoxic Respiratory failure;  Chest x ray showed bilateral PNA.  Patient on VM. BIPAP PRN.  Continue with nebulizer treatments.  Repeated Chest x ray 6-29 worsening infiltrates on the right, component of CHF.  received lasix one time dose 6-29 Continue with IV antibiotics, day 4. WBC trending down.  Appreciate palliative care. They will continue to follow.  Doppler Lower extremities negative for PE.  Tolerating 4 L, transition from VM 15 L.   2-Health care Associated PNA;  Continue with vancomycin and Fortaz, azithromycin . Day 4 antibiotics.  Legionella and strep antigen negative.Beverly Black   3-Right humerus fracture Right arm in sling, patient has seen orthopedics as outpatient, and they recommended conservative management.  3-AKI; Repeat labs pending.  NSL to avoid volume overload.   4-DVT prophylaxis Lovenox  Abdominal pian; improved after BM/ resolved,..  History of diastolic heart failure NSL fluids. Monitor volume status.  Hypernatremia;  encourage oral intake.   Code Status: DNR Family Communication: daughter  Disposition Plan: Transfer to [facility]   Consultants:  Palliative care team.   Procedures:  ECHO; normal Ef  Antibiotics:  Fortaz 6-26  Vancomycin 6-26  Azithromycin 6-27  HPI/Subjective:  does not have appetite. Breathing ok. Denies abdominal pain   Objective: Filed Vitals:   11/05/14 0801  BP: 135/59  Pulse: 85  Temp: 97.9 F (36.6 C)  Resp:     Intake/Output Summary (Last 24 hours) at 11/05/14 0826 Last data filed at 11/05/14 0030  Gross per 24 hour  Intake   1130 ml  Output    875 ml  Net    255 ml   Filed Weights   11/01/14 1120 11/01/14 1457 11/02/14 0443  Weight: 53.8 kg (118 lb 9.7 oz) 50.5 kg (111 lb 5.3 oz) 52 kg (114 lb 10.2 oz)    Exam:   General:  Alert in no distress.   Cardiovascular: S 1, S 2 RRR  Respiratory: Bilateral ronchus, no increase work of breathing.   Abdomen: BS present, soft, nt  Musculoskeletal: no edema.   Data Reviewed: Basic Metabolic Panel:  Recent Labs Lab 11/01/14 1050 11/02/14 0901 11/03/14 0407 11/04/14 0246 11/05/14 0225  NA 143 147* 144 148* 145  K 4.2 3.3* 3.7 3.5 3.0*  CL 109 119* 117* 115* 110  CO2 GLUCOSE 109* 75 104* 98 96  BUN 56* 41* 30* 22* 19  CREATININE 1.10* 0.84 0.77 0.73 0.67  CALCIUM 7.3* 6.8* 6.9* 7.5* 7.7*  MG  --  2.3  --   --   --  Liver Function Tests:  Recent Labs Lab 11/01/14 1050  AST 45*  ALT 24  ALKPHOS 108  BILITOT 0.6  PROT 4.8*  ALBUMIN 1.3*   No results for input(s): LIPASE, AMYLASE in the last 168 hours. No results for input(s): AMMONIA in the last 168 hours. CBC:  Recent Labs Lab 11/01/14 1050 11/02/14 0901 11/03/14 0407 11/04/14 0246 11/05/14 0225  WBC 15.9* 16.1* 12.1* 10.1 11.4*  NEUTROABS 13.7*  --   --   --   --   HGB 9.7* 9.4* 8.5* 8.6* 8.8*  HCT 29.5* 29.8* 26.8* 27.6* 28.0*  MCV 86.0 87.4 87.3 87.1 86.7  PLT 592* 510* 453* 412* 391    Cardiac Enzymes: No results for input(s): CKTOTAL, CKMB, CKMBINDEX, TROPONINI in the last 168 hours. BNP (last 3 results)  Recent Labs  11/01/14 1050  BNP 282.4*    ProBNP (last 3 results) No results for input(s): PROBNP in the last 8760 hours.  CBG: No results for input(s): GLUCAP in the last 168 hours.  Recent Results (from the past 240 hour(s))  Culture, blood (routine x 2)     Status: None (Preliminary result)   Collection Time: 11/01/14  1:50 PM  Result Value Ref Range Status   Specimen Description BLOOD RIGHT ARM  Final   Special Requests   Final    BOTTLES DRAWN AEROBIC AND ANAEROBIC BLUE 10CC RED 5CC   Culture NO GROWTH 3 DAYS  Final   Report Status PENDING  Incomplete  Culture, blood (routine x 2)     Status: None (Preliminary result)   Collection Time: 11/01/14  1:55 PM  Result Value Ref Range Status   Specimen Description BLOOD RIGHT HAND  Final   Special Requests BOTTLES DRAWN AEROBIC ONLY 10CC  Final   Culture NO GROWTH 3 DAYS  Final   Report Status PENDING  Incomplete  MRSA PCR Screening     Status: None   Collection Time: 11/01/14  7:35 PM  Result Value Ref Range Status   MRSA by PCR NEGATIVE NEGATIVE Final    Comment:        The GeneXpert MRSA Assay (FDA approved for NASAL specimens only), is one component of a comprehensive MRSA colonization surveillance program. It is not intended to diagnose MRSA infection nor to guide or monitor treatment for MRSA infections.      Studies: Dg Chest 2 View  11/04/2014   ADDENDUM REPORT: 11/04/2014 08:45  ADDENDUM: Displaced chronic RIGHT proximal humerus fracture is incidentally noted, with positional change compared to 6/20 11/2014.   Electronically Signed   By: Andreas Newport M.D.   On: 11/04/2014 08:45   11/04/2014   CLINICAL DATA:  Short of breath.  Fall.  EXAM: CHEST  2 VIEW  COMPARISON:  11/02/2014.  FINDINGS: Support apparatus: Monitoring leads project over the chest.  Cardiomediastinal Silhouette:  Slightly larger than on the prior exam some of which is probably projectional.  Lungs: Slightly worse airspace disease in the RIGHT lung, with the biggest increase in the inferior RIGHT upper lobe. The airspace disease is asymmetric and there is interstitial and mild airspace opacity on the LEFT. No pneumothorax. Calcified ovoid density at the LEFT lung base on the frontal projection is chronic and probably represents a calcified granuloma. Underlying emphysema is present, noted on prior exams along with old granulomatous disease.  Effusions:  Small RIGHT pleural effusion.  Other:  None.  IMPRESSION: Worsening RIGHT-greater-than-LEFT diffuse interstitial and airspace opacity. The findings suggest pneumonia with superimposed CHF given  the increased compared to 11/01/2014.  Electronically Signed: By: Andreas NewportGeoffrey  Lamke M.D. On: 11/04/2014 08:02    Scheduled Meds: . antiseptic oral rinse  7 mL Mouth Rinse q12n4p  . aspirin  81 mg Oral Daily  . azithromycin  500 mg Intravenous Q24H  . cefTAZidime (FORTAZ)  IV  2 g Intravenous Q12H  . chlorhexidine  15 mL Mouth Rinse BID  . collagenase   Topical Daily  . docusate sodium  100 mg Oral BID  . enoxaparin (LOVENOX) injection  40 mg Subcutaneous Q24H  . escitalopram  10 mg Oral Daily  . ezetimibe  10 mg Oral Daily  . feeding supplement (ENSURE ENLIVE)  237 mL Oral BID BM  . ipratropium-albuterol  3 mL Nebulization TID  . metoprolol  5 mg Intravenous 3 times per day  . mirtazapine  30 mg Oral QHS  . pantoprazole (PROTONIX) IV  40 mg Intravenous Q12H  . senna-docusate  1 tablet Oral QHS  . vancomycin  750 mg Intravenous Q24H   Continuous Infusions:   Active Problems:   Humerus fracture   HCAP (healthcare-associated pneumonia)   Pressure ulcer   Palliative care encounter    Time spent: 30 minutes.     Hartley BarefootRegalado, March Joos A  [facility] Pager [phone] If 7PM-7AM, please contact night-coverage at [email or web address], password [email or web address] 11/05/2014, 8:26  AM  LOS: 4 days

## 2014-11-05 NOTE — Care Management Note (Signed)
Case Management Note  Patient Details  Name: Jerilee Verdis Prime Laury MRN: [id number] Date of Birth: 08/28/1928  Subjective/Objective:           Patient is from [facility],, CSW referral.           Action/Plan:   Expected Discharge Date:                  Expected Discharge Plan:  Skilled Nursing Facility  In-House Referral:  Clinical Social Work  Discharge planning Services  CM Consult  Post Acute Care Choice:    Choice offered to:     DME Arranged:    DME Agency:     HH Arranged:    HH Agency:     Status of Service:  In process, will continue to follow  Medicare Important Message Given:  Yes-second notification given Date Medicare IM Given:    Medicare IM give by:    Date Additional Medicare IM Given:    Additional Medicare Important Message give by:     If discussed at Long Length of Stay Meetings, dates discussed:    Additional Comments:  Leone Havenaylor, Harsha Yusko Clinton, RN 11/05/2014, 4:51 PM

## 2014-11-05 NOTE — Progress Notes (Signed)
Attempted report x1. 

## 2014-11-05 NOTE — Clinical Social Work Note (Signed)
Clinical Social Work Assessment  Patient Details  Name: Beverly Black MRN: [id number] Date of Birth: 04/26/1929  Date of referral:  11/04/14               Reason for consult:  Facility Placement                Permission sought to share information with:  Facility Medical sales representativeContact Representative, Family Supports Permission granted to share information::  Yes, Verbal Permission Granted  Name::     Beverly Black or Beverly MortimerWanda  [facility]  [facility]  Relationship::  Paramedicdtr  Contact Information:     Housing/Transportation Living arrangements for the past 2 months:  Skilled Building surveyorursing Facility Source of Information:  Patient, Adult Children Patient Interpreter Needed:  None Criminal Activity/Legal Involvement Pertinent to Current Situation/Hospitalization:  No - Comment as needed Significant Relationships:  Adult Children Lives with:  Facility Resident Do you feel safe going back to the place where you live?  Yes Need for family participation in patient care:  Yes (Comment)  Care giving concerns:  [facility]- pt family is somewhat concerned about pt return [facility] because pt had pneumonia and the staff did not catch it   Office managerocial Worker assessment / plan:  CSW spoke with pt family about return to [facility] vs transfer to another facility  Employment status:  Retired Health and safety inspectornsurance information:  Teacher, English as a foreign languageManaged Medicare PT Recommendations:  Skilled Nursing Facility Information / Referral to community resources:  Skilled Nursing Facility  Patient/Family's Response to care:  Pt was agreeable to returning to [facility] if that is what her daughters agreed to- daughters unsure if they want patient to transfer but would like to look at options  Patient/Family's Understanding of and Emotional Response to Diagnosis, Current Treatment, and Prognosis:  Unclear- pt and dtrs did not express any concerns or questions about pt current care  Emotional Assessment Appearance:  Appears  stated age Attitude/Demeanor/Rapport:    Affect (typically observed):  Anxious, Appropriate Orientation:  Oriented to Self, Oriented to Place Alcohol / Substance use:  Not Applicable Psych involvement (Current and /or in the community):  No (Comment)  Discharge Needs  Concerns to be addressed:  Discharge Planning Concerns Readmission within the last 30 days:  Yes Current discharge risk:  Physical Impairment, Chronically ill Barriers to Discharge:  Continued Medical Work up   Beverly Black, Tashea Othman M, LCSW 11/05/2014, 2:09 PM

## 2014-11-05 NOTE — Progress Notes (Signed)
NURSING PROGRESS NOTE  Milaina N Robinette [id number] Transfer Data: 11/05/2014 11:27 AM Attending Provider: Alba CoryBelkys A Regalado, MD NWG:[id number]:Katherine Beverely Lowabori, MD Code Status: DNR  Allergies:  Sulfa antibiotics Past Medical History:   has a past medical history of Hypertension; Hyperlipidemia; Stroke (10/2008); Aneurysm; and Osteoporosis. Past Surgical History:   has past surgical history that includes Eye surgery and Partial hysterectomy (1968). Social History:   reports that she has been smoking.  She does not have any smokeless tobacco history on file. She reports that she does not drink alcohol or use illicit drugs.  Glendola Verdis Prime Reeder is a 81 y.o. female patient transferred from [facility]  Blood pressure 101/44, pulse 92, temperature 97.8 F (36.6 C), temperature source Oral, resp. rate 18, height 5\' 4"  (1.626 m), weight 51.6 kg (113 lb 12.1 oz), SpO2 94 %.  Cardiac Monitoring:  None ordered at this time.  IV Fluids:  IV in place, occlusive dsg intact without redness, IV cath wrist left, condition patent and no redness none.   Skin: Pressure ulcer to sacrum charted prior to transferring to 5W.  Will continue to evaluate and treat per MD orders.   Leane PlattSpencer Zipporah Finamore RN, BS, BSN

## 2014-11-05 NOTE — Progress Notes (Signed)
ANTIBIOTIC CONSULT NOTE - Follow-up  Pharmacy Consult for vancomycin Indication: pneumonia  Allergies  Allergen Reactions  . Sulfa Antibiotics     Cant remember reaction    Patient Measurements: Height: 5\' 4"  (162.6 cm) Weight: 113 lb 12.1 oz (51.6 kg) IBW/kg (Calculated) : 54.7  Vital Signs: Temp: 98.2 F (36.8 C) (06/30 1331) Temp Source: Oral (06/30 1331) BP: 138/59 mmHg (06/30 1331) Pulse Rate: 88 (06/30 1331) Intake/Output from previous day: 06/29 0701 - 06/30 0700 In: 1130 [P.O.:480; IV Piggyback:650] Out: 875 [Urine:875] Intake/Output from this shift: Total I/O In: 237 [P.O.:237] Out: -   Labs:  Recent Labs  11/03/14 0407 11/04/14 0246 11/05/14 0225  WBC 12.1* 10.1 11.4*  HGB 8.5* 8.6* 8.8*  PLT 453* 412* 391  CREATININE 0.77 0.73 0.67   Estimated Creatinine Clearance: 43.4 mL/min (by C-G formula based on Cr of 0.67).  Recent Labs  11/05/14 1207  [facility]     Microbiology: Recent Results (from the past 720 hour(s))  Culture, blood (routine x 2)     Status: None (Preliminary result)   Collection Time: 11/01/14  1:50 PM  Result Value Ref Range Status   Specimen Description BLOOD RIGHT ARM  Final   Special Requests   Final    BOTTLES DRAWN AEROBIC AND ANAEROBIC BLUE 10CC RED 5CC   Culture NO GROWTH 3 DAYS  Final   Report Status PENDING  Incomplete  Culture, blood (routine x 2)     Status: None (Preliminary result)   Collection Time: 11/01/14  1:55 PM  Result Value Ref Range Status   Specimen Description BLOOD RIGHT HAND  Final   Special Requests BOTTLES DRAWN AEROBIC ONLY 10CC  Final   Culture NO GROWTH 3 DAYS  Final   Report Status PENDING  Incomplete  MRSA PCR Screening     Status: None   Collection Time: 11/01/14  7:35 PM  Result Value Ref Range Status   MRSA by PCR NEGATIVE NEGATIVE Final    Comment:        The GeneXpert MRSA Assay (FDA approved for NASAL specimens only), is one component of a comprehensive MRSA  colonization surveillance program. It is not intended to diagnose MRSA infection nor to guide or monitor treatment for MRSA infections.    Assessment: [age] YOF admitted [facility] with SOB. She continues on broad-spectrum antibiotics with vancomycin, ceftazidime + azithromycin. A vancomycin trough was checked today and is below goal at 11. Pt is afebrile and WBC is mildly elevated at 11.4. Scr is stable at 0.67.   Vanc 6/26>> Ceftaz 6/26>> Azithro 6/27>>  6/26 Blood - NGTD  Goal of Therapy:  Vancomycin trough level 15-20 mcg/ml  Plan:  - Change vancomycin to 1gm IV Q24H - F/u renal fxn, C&S, clinical status [facility]  Nicolae Vasek, PharmD, BCPS Pager # [phone] 11/05/2014 1:33 PM

## 2014-11-05 NOTE — Clinical Social Work Placement (Signed)
   CLINICAL SOCIAL WORK PLACEMENT  NOTE  Date:  11/05/2014  Patient Details  Name: Jameeka Verdis Prime Siharath MRN: [id number] Date of Birth: 02/01/1929  Clinical Social Work is seeking post-discharge placement for this patient at the Skilled  Nursing Facility level of care (*CSW will initial, date and re-position this form in  chart as items are completed):  Yes   Patient/family provided with [facility] Clinical Social Work Department's list of facilities offering this level of care within the geographic area requested by the patient (or if unable, by the patient's family).  Yes   Patient/family informed of their freedom to choose among providers that offer the needed level of care, that participate in Medicare, Medicaid or managed care program needed by the patient, have an available bed and are willing to accept the patient.  Yes   Patient/family informed of [facility]'s ownership interest in [facility] and [facility], as well as of the fact that they are under no obligation to receive care at these facilities.  PASRR submitted to EDS on       PASRR number received on       Existing PASRR number confirmed on 11/05/14     FL2 transmitted to all facilities in geographic area requested by pt/family on 11/05/14     FL2 transmitted to all facilities within larger geographic area on       Patient informed that his/her managed care company has contracts with or will negotiate with certain facilities, including the following:            Patient/family informed of bed offers received.  Patient chooses bed at       Physician recommends and patient chooses bed at      Patient to be transferred to   on  .  Patient to be transferred to facility by       Patient family notified on   of transfer.  Name of family member notified:        PHYSICIAN Please sign FL2, Please sign DNR     Additional Comment:    _______________________________________________ Izora RibasHoloman, Ednamae Schiano M,  LCSW 11/05/2014, 2:11 PM

## 2014-11-05 NOTE — Progress Notes (Signed)
Attempted to call report to receiving nurse. Nurse unable to take report at this time.  ?

## 2014-11-05 NOTE — Progress Notes (Signed)
Chaplain Note:   Chaplain attempted visit with pt.   Pt was sleeping.   Will return before close of day.   Gala RomneyBrown, Jazzmyn Filion J, Chaplain 11/05/2014 1:47 PM

## 2014-11-05 NOTE — Progress Notes (Signed)
[facility] Patient's daughter notified of room change. Patient transferring to [phone].

## 2014-11-05 NOTE — Progress Notes (Signed)
Daily Progress Note   Patient Name: Beverly Black       Date: 11/05/2014 DOB: 1928/07/18  Age: 81 y.o. MRN#: [id number] Attending Physician: Alba Cory, MD Primary Care Physician: [name], MD Admit Date: 11/01/2014  Reason for Consultation/Follow-up: Establishing goals of care  Subjective:     Ms. Guyett is moaning and RN is at bedside says she has been moaning and yelling out this morning. She says that she is tired of being here and "being poked and pricked." I told her that I am sorry she has to go through all of this but we are only trying to help her feel better and this is the only way we can help her recover from her pneumonia. I have updated family.   I did update her daughters. Consuella Lose is relieved she is improving but torn about if she should return to [facility]. We discussed this a little and about transition into long term care. Consuella Lose at this point is thinking if her mother wants to return to [facility] that this might be best.   Length of Stay: 4 days  Current Medications: Scheduled Meds:  . antiseptic oral rinse  7 mL Mouth Rinse q12n4p  . aspirin  81 mg Oral Daily  . azithromycin  500 mg Intravenous Q24H  . cefTAZidime (FORTAZ)  IV  2 g Intravenous Q12H  . chlorhexidine  15 mL Mouth Rinse BID  . collagenase   Topical Daily  . docusate sodium  100 mg Oral BID  . enoxaparin (LOVENOX) injection  40 mg Subcutaneous Q24H  . escitalopram  10 mg Oral Daily  . ezetimibe  10 mg Oral Daily  . feeding supplement (ENSURE ENLIVE)  237 mL Oral BID BM  . ipratropium-albuterol  3 mL Nebulization TID  . metoprolol  5 mg Intravenous 3 times per day  . mirtazapine  30 mg Oral QHS  . pantoprazole (PROTONIX) IV  40 mg Intravenous Q12H  . potassium chloride  40 mEq Oral BID  . senna-docusate  1 tablet Oral QHS  . vancomycin  1,000 mg Intravenous Q24H    Continuous Infusions:    PRN Meds: albuterol, LORazepam, morphine injection, ondansetron **OR** ondansetron  (ZOFRAN) IV, traMADol  Palliative Performance Scale: 30%     Vital Signs: BP 138/59 mmHg  Pulse 88  Temp(Src) 98.2 F (36.8 C) (Oral)  Resp 16  Ht  (1.626 m)  Wt 51.6 kg (113 lb 12.1 oz)  BMI 19.52 kg/m2  SpO2 96% SpO2: SpO2: 96 % O2 Device: O2 Device: Nasal Cannula O2 Flow Rate: O2 Flow Rate (L/min): 6 L/min  Intake/output summary:  Intake/Output Summary (Last 24 hours) at 11/05/14 1458 Last data filed at 11/05/14 1337  Gross per 24 hour  Intake    547 ml  Output    575 ml  Net    -28 ml   LBM: Last BM Date: 11/02/14 Baseline Weight: Weight: 53.8 kg (118 lb 9.7 oz) Most recent weight: Weight: 51.6 kg (113 lb 12.1 oz)  Physical Exam: General: NAD, lying in bed, thin, frail HEENT: Temporal muscle wasting CVS: RRR Resp: No labored breathing today Abd: Soft, NT, ND Skin: Thin, frail, warm to touch Neuro: Awake, alert, oriented x person/place    Additional Data Reviewed: Recent Labs     11/04/14  0246  11/05/14  0225  WBC  10.1  11.4*  HGB  8.6*  8.8*  PLT  412*  391  NA  148*  145  BUN  22*  19  CREATININE  0.73  0.67     Problem List:  Patient Active Problem List   Diagnosis Date Noted  . Pressure ulcer 11/02/2014  . Palliative care encounter 11/02/2014  . HCAP (healthcare-associated pneumonia) 11/01/2014  . Leukocytosis 10/15/2014  . Humerus fracture 10/14/2014  . Poor balance 11/25/2013  . History of stroke 11/25/2013  . 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 Myalgia 09/05/2012  . Disequilibrium 09/05/2012  . Hypertension, essential 09/05/2012  . 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 Mobility impaired 07/15/2012  . Physical exam, routine 12/07/2011  . Cerumen impaction 04/04/2011  . Trapezius muscle spasm 04/04/2011  . HTN (hypertension) 11/30/2010  . Hyperlipidemia 11/30/2010  . CVA (cerebral infarction) 11/30/2010  . Postmenopausal HRT (hormone replacement therapy) 11/30/2010  . Anxiety 11/30/2010  . Ankle weakness 11/30/2010       Palliative Care Assessment & Plan    Code Status:  DNR  Goals of Care:  Hope for reversal of pneumonia and improvement.  3. Symptom Management:  Bowel regimen: Continue Senokot-S qhs.  Pain: I would favor low dose Vicodin 5-325 mg 1-2 tablets every 6 hours prn. Morphine 1 mg every 4 hours severe pain.   Dyspnea: Continue supportive care per primary team - antibiotics/oxygen.   4. Prognosis: < 6 months very likely  5. Discharge Planning: Skilled Nursing Facility for rehab with Palliative care service follow-up and likely transition to long term care   Thank you for allowing the Palliative Medicine Team to assist in the care of this patient.   Time In: 1400 Time Out: 1420 Total Time 20min Prolonged Time Billed  no     Greater than 50%  of this time was spent counseling and coordinating care related to the above assessment and plan.   Yong ChannelAlicia Yonah Tangeman, NP Palliative Medicine Team Pager # [phone] (M-F 8a-5p) Team Phone # [phone] (Nights/Weekends)  11/05/2014, 2:58 PM

## 2014-11-06 DIAGNOSIS — J189 Pneumonia, unspecified organism: Principal | ICD-10-CM

## 2014-11-06 DIAGNOSIS — R0602 Shortness of breath: Secondary | ICD-10-CM | POA: Insufficient documentation

## 2014-11-06 DIAGNOSIS — S42301D Unspecified fracture of shaft of humerus, right arm, subsequent encounter for fracture with routine healing: Secondary | ICD-10-CM

## 2014-11-06 DIAGNOSIS — R0902 Hypoxemia: Secondary | ICD-10-CM

## 2014-11-06 LAB — CULTURE, BLOOD (ROUTINE X 2)
CULTURE: NO GROWTH
CULTURE: NO GROWTH

## 2014-11-06 LAB — MAGNESIUM: Magnesium: 1.6 mg/dL — ABNORMAL LOW (ref 1.7–2.4)

## 2014-11-06 MED ORDER — BUDESONIDE 0.25 MG/2ML IN SUSP
0.2500 mg | Freq: Two times a day (BID) | RESPIRATORY_TRACT | Status: DC
  Administered 2014-11-06 – 2014-11-07 (×2): 0.25 mg via RESPIRATORY_TRACT
  Filled 2014-11-06 (×4): qty 2

## 2014-11-06 MED ORDER — POTASSIUM CHLORIDE CRYS ER 20 MEQ PO TBCR
40.0000 meq | EXTENDED_RELEASE_TABLET | ORAL | Status: AC
  Administered 2014-11-06 (×3): 40 meq via ORAL
  Filled 2014-11-06 (×4): qty 2

## 2014-11-06 MED ORDER — AMOXICILLIN-POT CLAVULANATE 400-57 MG/5ML PO SUSR
875.0000 mg | Freq: Two times a day (BID) | ORAL | Status: DC
  Administered 2014-11-07 (×2): 875 mg via ORAL
  Filled 2014-11-06 (×3): qty 10.9

## 2014-11-06 MED ORDER — AMOXICILLIN-POT CLAVULANATE 875-125 MG PO TABS
1.0000 | ORAL_TABLET | Freq: Two times a day (BID) | ORAL | Status: DC
  Filled 2014-11-06: qty 1

## 2014-11-06 NOTE — Clinical Social Work Placement (Signed)
   CLINICAL SOCIAL WORK PLACEMENT  NOTE  Date:  11/06/2014  Patient Details  Name: Beverly Black MRN: [id number] Date of Birth: 13-Dec-1928  Clinical Social Work is seeking post-discharge placement for this patient at the [place] level of care (*CSW will initial, date and re-position this form in  chart as items are completed):  Yes   Patient/family provided with [place] Work Department's list of facilities offering this level of care within the geographic area requested by the patient (or if unable, by the patient's family).  Yes   Patient/family informed of their freedom to choose among providers that offer the needed level of care, that participate in Medicare, Medicaid or managed care program needed by the patient, have an available bed and are willing to accept the patient.  Yes   Patient/family informed of [facility]'s ownership interest in [facility] and [facility], as well as of the fact that they are under no obligation to receive care at these facilities.  PASRR submitted to EDS on       PASRR number received on       Existing PASRR number confirmed on 11/05/14     FL2 transmitted to all facilities in geographic area requested by pt/family on 11/05/14     FL2 transmitted to all facilities within larger geographic area on       Patient informed that his/her managed care company has contracts with or will negotiate with certain facilities, including the following:        Yes   Patient/family informed of bed offers received.  Patient chooses bed at       Physician recommends and patient chooses bed at      Patient to be transferred to   on  .  Patient to be transferred to facility by       Patient family notified on   of transfer.  Name of family member notified:        PHYSICIAN Please sign FL2, Please sign DNR     Additional Comment:  Met with patient at bedside. Patient states she is fine with returning to [facility] as she did not have a negative experience with this facility. CSW had to leave message with daughter Abigail Butts and unable to leave message for Margaretha Sheffield as voicemail is not set up. Bed offers left on [facility] voicemail. [facility] is able to take the patient back at discharge. At this time, that will be the plan for discharge, unless family contacts CSW to make other arrangements.   _______________________________________________ Liz Beach MSW, [place], [place], [phone]

## 2014-11-06 NOTE — Progress Notes (Signed)
Daily Progress Note   Patient Name: Beverly Black       Date: 11/06/2014 DOB: 04/07/1929  Age: 81 y.o. MRN#: [id number] Attending Physician: Vassie Lollarlos Madera, MD Primary Care Physician: Neena RhymesKatherine Tabori, MD Admit Date: 11/01/2014  Reason for Consultation/Follow-up: Establishing goals of care  Subjective:     [name] is sitting up in bed. She has lunch in front of her and is eating her cake. She tells me that she has no appetite but does enjoy sweets. She does says she likes Ensures. She continues to moan and sigh much throughout conversation and still does not remember that she came to the hospital with pneumonia (we discuss this everyday). She does tell me that she prefers to return to [facility] upon discharge as she is used to being there now - does say she wants to go home. Family is not at bedside and difficult to reach as one is out of town and other daughter is working and cannot get to her phone readily. I [facility] yesterday. If needed you can send text message and she will call when she is able.    Length of Stay: 5 days  Current Medications: Scheduled Meds:  . antiseptic oral rinse  7 mL Mouth Rinse q12n4p  . aspirin  81 mg Oral Daily  . azithromycin  500 mg Intravenous Q24H  . cefTAZidime (FORTAZ)  IV  2 g Intravenous Q12H  . chlorhexidine  15 mL Mouth Rinse BID  . collagenase   Topical Daily  . docusate sodium  100 mg Oral BID  . enoxaparin (LOVENOX) injection  40 mg Subcutaneous Q24H  . escitalopram  10 mg Oral Daily  . ezetimibe  10 mg Oral Daily  . feeding supplement (ENSURE ENLIVE)  237 mL Oral BID BM  . ipratropium-albuterol  3 mL Nebulization TID  . metoprolol  5 mg Intravenous 3 times per day  . mirtazapine  30 mg Oral QHS  . pantoprazole (PROTONIX) IV  40 mg Intravenous Q12H  . potassium chloride  40 mEq Oral Q4H  . senna-docusate  1 tablet Oral QHS  . vancomycin  1,000 mg Intravenous Q24H    Continuous Infusions:    PRN  Meds: albuterol, LORazepam, morphine injection, ondansetron **OR** ondansetron (ZOFRAN) IV, traMADol  Palliative Performance Scale: 30%     Vital Signs: BP 115/61 mmHg  Pulse 94  Temp(Src) 98.2 F (36.8 C) (Oral)  Resp 16  Ht 5\' 4"  (1.626 m)  Wt 51.6 kg (113 lb 12.1 oz)  BMI 19.52 kg/m2  SpO2 91% SpO2: SpO2: 91 % O2 Device: O2 Device: Nasal Cannula O2 Flow Rate: O2 Flow Rate (L/min): 6 L/min  Intake/output summary:  Intake/Output Summary (Last 24 hours) at 11/06/14 1355 Last data filed at 11/06/14 0949  Gross per 24 hour  Intake    170 ml  Output    650 ml  Net   -480 ml   LBM: Last BM Date: 11/04/14 Baseline Weight: Weight: 53.8 kg (118 lb 9.7 oz) Most recent weight: Weight: 51.6 kg (113 lb 12.1 oz)  Physical Exam: General: NAD, lying in bed, thin, frail HEENT: Temporal muscle wasting CVS: RRR Resp: No labored breathing today Abd: Soft, NT, ND Skin: Thin, frail, warm to touch Neuro: Awake, alert, oriented x person/place   Additional Data Reviewed: Recent Labs     11/04/14  0246  11/05/14  0225  WBC  10.1  11.4*  HGB  8.6*  8.8*  PLT  412*  391  NA  148*  145  BUN  22*  19  CREATININE  0.73  0.67     Problem List:  Patient Active Problem List   Diagnosis Date Noted  . Pressure ulcer 11/02/2014  . Palliative care encounter 11/02/2014  . HCAP (healthcare-associated pneumonia) 11/01/2014  . Leukocytosis 10/15/2014  . Humerus fracture 10/14/2014  . Poor balance 11/25/2013  . History of stroke 11/25/2013  . 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 Myalgia 09/05/2012  . Disequilibrium 09/05/2012  . Hypertension, essential 09/05/2012  . 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 Mobility impaired 07/15/2012  . Physical exam, routine 12/07/2011  . Cerumen impaction 04/04/2011  . Trapezius muscle spasm 04/04/2011  . HTN (hypertension) 11/30/2010  . Hyperlipidemia 11/30/2010  . CVA (cerebral infarction) 11/30/2010  . Postmenopausal HRT (hormone  replacement therapy) 11/30/2010  . Anxiety 11/30/2010  . Ankle weakness 11/30/2010     Palliative Care Assessment & Plan    Code Status:  DNR  Goals of Care:  Hope for reversal of pneumonia and improvement.  3. Symptom Management:  Bowel regimen: Continue Senokot-S qhs.  Pain: I would favor low dose Vicodin 5-325 mg 1-2 tablets every 6 hours prn. Morphine 1 mg every 4 hours severe pain.   Dyspnea: Continue supportive care per primary team - antibiotics/oxygen.   4. Prognosis: < 6 months very likely but difficult to determine  5. Discharge Planning: Skilled Nursing Facility for rehab with Palliative care service follow-up and likely transition to long term care   Thank you for allowing the Palliative Medicine Team to assist in the care of this patient.   Time In: 1320 Time Out: 1340 Total Time Prolonged Time Billed  no     Greater than 50%  of this time was spent counseling and coordinating care related to the above assessment and plan.   Yong Channel, NP Palliative Medicine Team Pager # [phone] (M-F 8a-5p) Team Phone # [phone] (Nights/Weekends)  11/06/2014, 1:55 PM

## 2014-11-06 NOTE — Progress Notes (Signed)
RT instructed patient on use of flutter valve. Patient demonstrated back proper use.

## 2014-11-06 NOTE — Evaluation (Signed)
Physical Therapy Evaluation Patient Details Name: Beverly Black MRN: [id number] DOB: 10/11/1928 Today's Date: 11/06/2014   History of Present Illness  Patient is an 81 yo female admitted 11/01/14 from [facility] with dehydration and HCAP.    PMH:  recent fall with Rt humerus fx in sling, CVA with residual gait disturbance, osteoporosis, HTN  Clinical Impression  Patient presents with problems listed below.  Will benefit from acute PT to maximize functional mobility prior to discharge.  Recommend patient return to SNF for continued therapy at d/c.    Follow Up Recommendations SNF    Equipment Recommendations  None recommended by PT    Recommendations for Other Services       Precautions / Restrictions Precautions Precautions: Fall Precaution Comments: pt reports multiple falls in past year Required Braces or Orthoses: Sling (RUE) Restrictions Weight Bearing Restrictions: Yes RUE Weight Bearing: Non weight bearing      Mobility  Bed Mobility Overal bed mobility: Needs Assistance Bed Mobility: Rolling Rolling: Mod assist         General bed mobility comments: Verbal cues for technique.  Assist to roll toward Lt side.  Patient unwilling to attempt moving to sitting.  Had patient attempt bridging to reposition herself in bed.  Patient declined further mobility due to pain.  Transfers                    Ambulation/Gait                Stairs            Wheelchair Mobility    Modified Rankin (Stroke Patients Only)       Balance                                             Pertinent Vitals/Pain Pain Assessment: Faces Faces Pain Scale: Hurts even more Pain Location: Rt shoulder/arm Pain Descriptors / Indicators: Aching Pain Intervention(s): Limited activity within patient's tolerance;Repositioned    Home Living Family/patient expects to be discharged to:: Skilled nursing facility                 Additional  Comments: Per chart, patient to return to [facility] with Palliative Care.    Prior Function Level of Independence: Independent with assistive device(s) (prior to recent fall)         Comments: pt was independent with mobility using a cane prior to shoulder fx 10/08/14, she reports multiple falls in past year     Hand Dominance   Dominant Hand: Right    Extremity/Trunk Assessment   Upper Extremity Assessment: RUE deficits/detail RUE Deficits / Details: Rt humerus fx, in sling. RUE: Unable to fully assess due to pain;Unable to fully assess due to immobilization       Lower Extremity Assessment: Generalized weakness      Cervical / Trunk Assessment: Kyphotic  Communication   Communication: No difficulties  Cognition Arousal/Alertness: Awake/alert Behavior During Therapy: Anxious (Frustrated with pain)  Tearful Overall Cognitive Status: Within Functional Limits for tasks assessed                      General Comments      Exercises General Exercises - Lower Extremity Ankle Circles/Pumps: AROM;Both;10 reps;Supine      Assessment/Plan    PT Assessment Patient needs continued PT  services  PT Diagnosis Difficulty walking;Generalized weakness;Acute pain   PT Problem List Decreased strength;Decreased range of motion;Decreased activity tolerance;Decreased balance;Decreased mobility;Decreased knowledge of use of DME;Cardiopulmonary status limiting activity;Pain  PT Treatment Interventions DME instruction;Gait training;Functional mobility training;Therapeutic exercise;Therapeutic activities;Patient/family education   PT Goals (Current goals can be found in the Care Plan section) Acute Rehab PT Goals Patient Stated Goal: None stated PT Goal Formulation: With patient Time For Goal Achievement: 11/13/14 Potential to Achieve Goals: Fair    Frequency Min 3X/week   Barriers to discharge        Co-evaluation               End of Session Equipment  Utilized During Treatment: Oxygen Activity Tolerance: Patient limited by pain Patient left: in bed;with call bell/phone within reach;with bed alarm set           Time: 1610-9604 PT Time Calculation (min) (ACUTE ONLY): 9 min   Charges:   PT Evaluation $Initial PT Evaluation Tier I: 1 Procedure     PT G CodesVena Austria 11/27/2014, 6:54 PM  Durenda Hurt. Renaldo Fiddler, [facility] Acute Rehab Services Pager [phone]

## 2014-11-06 NOTE — Clinical Social Work Note (Signed)
Received return call from [name], she states that the family is ok with the patient returning to [facility] but may consider moving the patient to a different if they have any issues. Daughter [name] states she will want to be present for the discharge. CSW will notate this on weekend handoff.    Roddie McBryant Jessice Madill MSW, GrantLCSWA, [place], [phone]

## 2014-11-06 NOTE — Progress Notes (Signed)
[facility] PROGRESS NOTE  Beverly Black ZOX:[id number] DOB: 02/25/1929 DOA: 11/01/2014 PCP: Neena RhymesKatherine Tabori, MD  Assessment/Plan: 81 year old female who  has a past medical history of Hypertension; Hyperlipidemia; Stroke (10/2008);  patient was brought to the hospital from skilled facility after patient was found to have low O2 sats of 60% on 4 L of oxygen. Patient recently had a fall with a humerus fracture and was placed in the rehabilitation facility for strengthening earlier this month. Patient was started on oxygen 3 weeks ago after [facility]. Never diagnosed with COPD and has not been using any inhalers.  Chest x-ray showed bilateral infiltrates right more than left. And patient started on vancomycin and Ceptaz for healthcare associated pneumonia. Patient required to be VM 15 L. Palliative care consulted and following. Patient has improved today, she is now on 4 L of oxygen.   1-Acute hypoxic Respiratory failure; due to PNA and component of acute on chronic diastolic heart failure Continue with nebulizer treatments; will start pulmicort Repeated Chest x ray 6-29 worsening infiltrates on the right, with component of CHF.  received lasix two times 6-29>>>6/30 Will repeat CXR in am After 5 days of IV abx's; will transition to PO augmentin and complete a total of 10 days  Appreciate palliative care. They will continue to follow.  Doppler Lower extremities negative for PE.  Tolerating 4 L, will continue titration down to RA as tolerated   2-Health care Associated PNA;  Has received 5 days of broad spectrum antibiotics (vancomycin and Fortaz, azithromycin) Now afebrile and with WBC's pretty much in normal range. Will transition to PO antibiotics  Legionella and strep antigen negative..  Will Start pulmicort and flutter valve  3-Right humerus fracture Right arm in sling, patient has seen orthopedics as outpatient, and they recommended conservative  management.  AKI; Due to initial pre-renal azotemia with infection and poor oral intake -resolved now after IVF's  DVT prophylaxis Lovenox  Abdominal pian; most likely due to obstipation Resolved Will monitor  History of diastolic heart failure: with mild exacerbation Monitor volume status. Received IV lasix X 2 days with improvement in her breathing will repeat CXR in am  Hypernatremia; encourage to have better hydration   Code Status: DNR Family Communication: daughter by phone Disposition Plan: Transition abx's to PO; start flutter valve and pulmicort; most likely back to SNF in 1-2 days depending progression   Consultants:  Palliative care team.   Procedures:  ECHO: normal EF, grade 1 diastolic HF  Antibiotics:  Elita QuickFortaz 6-26>>>7/1  Vancomycin 6-26>>>7/1  Azithromycin 6-27>>7/1  Augmentin 7/1  HPI/Subjective: Breathing better; still requiring 4L [place] O2 supplementation. No CP, no N/V. Still with poor appetite    Objective: Filed Vitals:   11/06/14 1403  BP: 127/45  Pulse: 91  Temp: 98.6 F (37 C)  Resp: 18    Intake/Output Summary (Last 24 hours) at 11/06/14 1855 Last data filed at 11/06/14 1848  Gross per 24 hour  Intake   1480 ml  Output    600 ml  Net    880 ml   Filed Weights   11/01/14 1457 11/02/14 0443 11/05/14 1117  Weight: 50.5 kg (111 lb 5.3 oz) 52 kg (114 lb 10.2 oz) 51.6 kg (113 lb 12.1 oz)    Exam:   General:  Alert, oriented X 3, no fever and breathing better today.    Cardiovascular: S1, S2, RRR; no JVD  Respiratory: Bilateral rhonchi, decrease BS at bases, slight exp  wheezing; no frank crackles  Abdomen: BS present, soft, nt  Musculoskeletal: no edema.   Data Reviewed: Basic Metabolic Panel:  Recent Labs Lab 11/01/14 1050 11/02/14 0901 11/03/14 0407 11/04/14 0246 11/05/14 0225 11/06/14 0925  NA 143 147* 144 148* 145  --   K 4.2 3.3* 3.7 3.5 3.0*  --   CL 109 119* 117* 115* 110  --   CO2 25 22 22 25 28   --    GLUCOSE 109* 75 104* 98 96  --   BUN 56* 41* 30* 22* 19  --   CREATININE 1.10* 0.84 0.77 0.73 0.67  --   CALCIUM 7.3* 6.8* 6.9* 7.5* 7.7*  --   MG  --  2.3  --   --   --  1.6*   Liver Function Tests:  Recent Labs Lab 11/01/14 1050  AST 45*  ALT 24  ALKPHOS 108  BILITOT 0.6  PROT 4.8*  ALBUMIN 1.3*   CBC:  Recent Labs Lab 11/01/14 1050 11/02/14 0901 11/03/14 0407 11/04/14 0246 11/05/14 0225  WBC 15.9* 16.1* 12.1* 10.1 11.4*  NEUTROABS 13.7*  --   --   --   --   HGB 9.7* 9.4* 8.5* 8.6* 8.8*  HCT 29.5* 29.8* 26.8* 27.6* 28.0*  MCV 86.0 87.4 87.3 87.1 86.7  PLT 592* 510* 453* 412* 391   BNP (last 3 results)  Recent Labs  11/01/14 1050  BNP 282.4*    ProBNP (last 3 results) No results for input(s): PROBNP in the last 8760 hours.  CBG: No results for input(s): GLUCAP in the last 168 hours.  Recent Results (from the past 240 hour(s))  Culture, blood (routine x 2)     Status: None   Collection Time: 11/01/14  1:50 PM  Result Value Ref Range Status   Specimen Description BLOOD RIGHT ARM  Final   Special Requests   Final    BOTTLES DRAWN AEROBIC AND ANAEROBIC BLUE 10CC RED 5CC   Culture NO GROWTH 5 DAYS  Final   Report Status 11/06/2014 FINAL  Final  Culture, blood (routine x 2)     Status: None   Collection Time: 11/01/14  1:55 PM  Result Value Ref Range Status   Specimen Description BLOOD RIGHT HAND  Final   Special Requests BOTTLES DRAWN AEROBIC ONLY 10CC  Final   Culture NO GROWTH 5 DAYS  Final   Report Status 11/06/2014 FINAL  Final  MRSA PCR Screening     Status: None   Collection Time: 11/01/14  7:35 PM  Result Value Ref Range Status   MRSA by PCR NEGATIVE NEGATIVE Final    Comment:        The GeneXpert MRSA Assay (FDA approved for NASAL specimens only), is one component of a comprehensive MRSA colonization surveillance program. It is not intended to diagnose MRSA infection nor to guide or monitor treatment for MRSA infections.       Studies: No results found.  Scheduled Meds: . amoxicillin-clavulanate  1 tablet Oral Q12H  . antiseptic oral rinse  7 mL Mouth Rinse q12n4p  . aspirin  81 mg Oral Daily  . budesonide (PULMICORT) nebulizer solution  0.25 mg Nebulization BID  . chlorhexidine  15 mL Mouth Rinse BID  . collagenase   Topical Daily  . docusate sodium  100 mg Oral BID  . enoxaparin (LOVENOX) injection  40 mg Subcutaneous Q24H  . escitalopram  10 mg Oral Daily  . ezetimibe  10 mg Oral Daily  . feeding  supplement (ENSURE ENLIVE)  237 mL Oral BID BM  . ipratropium-albuterol  3 mL Nebulization TID  . metoprolol  5 mg Intravenous 3 times per day  . mirtazapine  30 mg Oral QHS  . pantoprazole (PROTONIX) IV  40 mg Intravenous Q12H  . potassium chloride  40 mEq Oral Q4H  . senna-docusate  1 tablet Oral QHS   Continuous Infusions:   Active Problems:   Humerus fracture   HCAP (healthcare-associated pneumonia)   Pressure ulcer   Palliative care encounter   Hypoxia   Time spent: 30 minutes.    Vassie Loll  [facility] Pager [phone] If 7PM-7AM, please contact night-coverage at [email or web address], password [facility] 11/06/2014, 6:55 PM  LOS: 5 days

## 2014-11-07 ENCOUNTER — Inpatient Hospital Stay (HOSPITAL_COMMUNITY): Payer: Medicare Other

## 2014-11-07 DIAGNOSIS — L8990 Pressure ulcer of unspecified site, unspecified stage: Secondary | ICD-10-CM

## 2014-11-07 DIAGNOSIS — I5033 Acute on chronic diastolic (congestive) heart failure: Secondary | ICD-10-CM

## 2014-11-07 DIAGNOSIS — J441 Chronic obstructive pulmonary disease with (acute) exacerbation: Secondary | ICD-10-CM | POA: Insufficient documentation

## 2014-11-07 MED ORDER — IPRATROPIUM-ALBUTEROL 0.5-2.5 (3) MG/3ML IN SOLN: 3.0000 mL | RESPIRATORY_TRACT | Status: DC | PRN

## 2014-11-07 MED ORDER — OXYCODONE HCL 5 MG PO TABS: 5.0000 mg | ORAL_TABLET | Freq: Four times a day (QID) | ORAL | Status: DC | PRN

## 2014-11-07 MED ORDER — POLYETHYLENE GLYCOL 3350 17 G PO PACK: 17.0000 g | PACK | Freq: Every day | ORAL | Status: AC

## 2014-11-07 MED ORDER — BUDESONIDE 0.25 MG/2ML IN SUSP: 0.2500 mg | Freq: Two times a day (BID) | RESPIRATORY_TRACT | Status: DC

## 2014-11-07 MED ORDER — LORAZEPAM 0.5 MG PO TABS: 0.5000 mg | ORAL_TABLET | Freq: Three times a day (TID) | ORAL | Status: DC

## 2014-11-07 MED ORDER — PANTOPRAZOLE SODIUM 40 MG PO TBEC: 40.0000 mg | DELAYED_RELEASE_TABLET | Freq: Two times a day (BID) | ORAL | Status: DC

## 2014-11-07 MED ORDER — ENSURE ENLIVE PO LIQD: 237.0000 mL | Freq: Three times a day (TID) | ORAL | Status: DC

## 2014-11-07 MED ORDER — TRAMADOL HCL 50 MG PO TABS: 50.0000 mg | ORAL_TABLET | Freq: Four times a day (QID) | ORAL | Status: DC | PRN

## 2014-11-07 MED ORDER — PANTOPRAZOLE SODIUM 40 MG PO TBEC
40.0000 mg | DELAYED_RELEASE_TABLET | Freq: Two times a day (BID) | ORAL | Status: DC
  Administered 2014-11-07: 40 mg via ORAL
  Filled 2014-11-07: qty 1

## 2014-11-07 MED ORDER — AMOXICILLIN-POT CLAVULANATE 400-57 MG/5ML PO SUSR: 875.0000 mg | Freq: Two times a day (BID) | ORAL | Status: AC

## 2014-11-07 NOTE — Discharge Summary (Signed)
Physician Discharge Summary  Beverly Black HYQ:[id number] DOB: 10-Nov-1928 DOA: 11/01/2014  PCP: [name], MD  Admit date: 11/01/2014 Discharge date: 11/07/2014  Time spent: >30 minutes  Recommendations for Outpatient Follow-up:  CBC to Follow Hgb and platets BMET to follow electrolytes and renal function Please make follow up appointment with orthopedic service and with PCP Titrate O2 down s to RA as tolerated Arrange follow up in 2-3 weeks with pulmonary service  Discharge Diagnoses:  Active Problems:   Humerus fracture   HCAP (healthcare-associated pneumonia)   Pressure ulcer   Palliative care encounter   Hypoxia   SOB (shortness of breath) Decubitus ulcer   Discharge Condition: stable and improved. Discharged to SNF for physical rehabilitation  Diet recommendation: heart healthy and low sodium diet  Filed Weights   11/01/14 1457 11/02/14 0443 11/05/14 1117  Weight: 50.5 kg (111 lb 5.3 oz) 52 kg (114 lb 10.2 oz) 51.6 kg (113 lb 12.1 oz)    History of present illness:  year-old female who has a past medical history of Hypertension; Hyperlipidemia; Stroke (10/2008); patient was brought to the hospital from skilled facility after patient was found to have low O2 sats of 60% on 4 L of oxygen. Patient recently had a fall with a humerus fracture and was placed in the rehabilitation facility for strengthening earlier this month. Patient was started on oxygen 3 weeks ago after she was discharged from the [facility] ED. Never diagnosed with COPD and has not been using any inhalers.  Chest x-ray showed bilateral infiltrates right more than left. And patient started on vancomycin and Ceptaz for healthcare associated pneumonia. Patient required to be VM 15 L. Palliative care consulted and following. Patient has improved today, she is now on 4 L of oxygen  Hospital Course:  1-Acute hypoxic Respiratory failure; due to PNA and component of acute on chronic diastolic heart  failure Continue with nebulizer treatments; including pulmicort Good response to extra fluid/vascular congestion; treated with IV lasix Repeated CXR in am of discharge demonstrated still infiltrates process, no vascular congestion seen  After 5 days of IV abx's; will transition to PO augmentin and complete a total of 10 days  Appreciate palliative care inputs and recommendations. They will continue to follow patient at SNF and make further adjustments to her treatment base on response.  Doppler Lower extremities negative for PE.  Will discharge on 3-4 L O2 supplementation; will continue titration down to RA as tolerated   Health care Associated PNA;  Has received 5 days of broad spectrum antibiotics (vancomycin, Fortaz and azithromycin) Now afebrile and with WBC's pretty much in normal range.  Will transition to PO antibiotics (augmentin) and will complete 5 more days of treatment Legionella and strep antigen negative..  Will discharge on pulmicort, atrovent/albuterol nebs and flutter valve  Right humerus fracture Right arm in sling, patient has been seen by orthopedic service as outpatient, and they recommended conservative management for now -had upcoming appointment to follow on her right humerus fracture  AKI; Due to initial pre-renal azotemia with infection and poor oral intake -resolved by time of discharge after IVF's -advise to maintain adequate hydration  Abdominal pian; most likely due to obstipation and GERD Resolved after having BM's Will discharge on bowel regimen Continue PPI  History of diastolic heart failure: with mild exacerbation Monitor volume status, check daily weights. Received IV lasix X 2 days with improvement/resolution in her impaired breathing Heart healthy/low sodium diet No needs for maintenance lasix at this moment  Hypernatremia; encourage to have better hydration -resolved with fluid resuscitation  Decubitus ulcer: continue prevention care  and use of Santyl ointment to chemically debride nonviable tissue to unstageable areas. Foam dressing to protect and repel moisture. Discussed plan of care with patient and she verbalized understanding.  Procedures:  ECHO: normal EF, grade 1 diastolic HF  Consultations:  Palliative Care  Discharge Exam: Filed Vitals:   11/07/14 1318  BP: 132/93  Pulse: 92  Temp: 97.6 F (36.4 C)  Resp: 20    General: Alert, oriented X 3, no fever and breathing a lot better today.   Cardiovascular: S1, S2, RRR; no JVD  Respiratory: Bilateral rhonchi, no frank crackles, slight end exp wheezing  Abdomen: BS present, soft, no guarding  Musculoskeletal: no edema.   Discharge Instructions  Discharge Instructions    Diet - low sodium heart healthy    Complete by:  As directed      Discharge instructions    Complete by:  As directed   Palliative Care to follow patient at SNF Maintain adequate hydration Encourage PO intake Maintain patient in upright position while eating and at least up to 40 mins after eating Take medications as prescribed Arrange follow up with PCP in 10 days Physical rehabilitation as per facility protocol          Current Discharge Medication List    START taking these medications   Details  amoxicillin-clavulanate (AUGMENTIN) 400-57 MG/5ML suspension Take 10.9 mLs (875 mg total) by mouth every 12 (twelve) hours. To be taken for 5 more days Qty: 100 mL, Refills: 0    budesonide (PULMICORT) 0.25 MG/2ML nebulizer solution Take 2 mLs (0.25 mg total) by nebulization 2 (two) times daily. Qty: 60 mL, Refills: 12    !! feeding supplement, ENSURE ENLIVE, (ENSURE ENLIVE) LIQD Take 237 mLs by mouth 3 (three) times daily between meals. Qty: 237 mL, Refills: 12    ipratropium-albuterol (DUONEB) 0.5-2.5 (3) MG/3ML SOLN Take 3 mLs by nebulization every 4 (four) hours as needed (SOB/wheezing).    oxyCODONE (OXY IR/ROXICODONE) 5 MG immediate release tablet Take 1 tablet  (5 mg total) by mouth every 6 (six) hours as needed for severe pain. Qty: 30 tablet, Refills: 0    pantoprazole (PROTONIX) 40 MG tablet Take 1 tablet (40 mg total) by mouth 2 (two) times daily.    polyethylene glycol (MIRALAX / GLYCOLAX) packet Take 17 g by mouth daily. To be hold for diarrhea Qty: 14 each, Refills: 0     !! - Potential duplicate medications found. Please discuss with provider.    CONTINUE these medications which have CHANGED   Details  LORazepam (ATIVAN) 0.5 MG tablet Take 1 tablet (0.5 mg total) by mouth every 8 (eight) hours. Qty: 30 tablet, Refills: 0    traMADol (ULTRAM) 50 MG tablet Take 1 tablet (50 mg total) by mouth every 6 (six) hours as needed for moderate pain. Qty: 15 tablet, Refills: 0      CONTINUE these medications which have NOT CHANGED   Details  amLODipine (NORVASC) 10 MG tablet TAKE 1 TABLET AT BEDTIME Qty: 90 tablet, Refills: 1    aspirin 81 MG tablet Take 81 mg by mouth daily.      atenolol (TENORMIN) 50 MG tablet TAKE 1 TABLET TWICE A DAY Qty: 180 tablet, Refills: 1    atorvastatin (LIPITOR) 40 MG tablet TAKE 1 TABLET DAILY Qty: 90 tablet, Refills: 0    cholecalciferol (VITAMIN D) 1000 UNITS tablet Take 1,000 Units by  mouth daily.    docusate sodium (COLACE) 100 MG capsule Take 100 mg by mouth 2 (two) times daily.    !! ENSURE (ENSURE) Take 237 mLs by mouth daily. At 2 pm    escitalopram (LEXAPRO) 10 MG tablet Take 10 mg by mouth daily.    fexofenadine (ALLEGRA) 180 MG tablet Take 180 mg by mouth daily.    mirtazapine (REMERON) 30 MG tablet TAKE 1 TABLET AT BEDTIME Qty: 90 tablet, Refills: 1    PREMARIN 0.3 MG tablet TAKE 1 TABLET DAILY FOR 21 DAYS THEN DO NOT TAKE FOR 7 DAYS Qty: 90 tablet, Refills: 3    PRESCRIPTION MEDICATION Pt to take 1 two times a day to promote weight gain. MAGIC CUP    ZETIA 10 MG tablet TAKE 1 TABLET DAILY Qty: 90 tablet, Refills: 1     !! - Potential duplicate medications found. Please discuss with  provider.    STOP taking these medications     alendronate (FOSAMAX) 70 MG tablet      lisinopril (PRINIVIL,ZESTRIL) 20 MG tablet      naproxen (NAPROSYN) 500 MG tablet      sennosides-docusate sodium (SENOKOT-S) 8.6-50 MG tablet        Allergies  Allergen Reactions  . Sulfa Antibiotics     Cant remember reaction   Follow-up Information    Follow up with Neena RhymesKatherine Tabori, MD. Schedule an appointment as soon as possible for a visit in 10 days.   Specialty:  Family Medicine   Contact information:   [address] [place] [address] [phone]       The results of significant diagnostics from this hospitalization (including imaging, microbiology, ancillary and laboratory) are listed below for reference.    Significant Diagnostic Studies: Dg Chest 2 View  11/07/2014   CLINICAL DATA:  Shortness of breath.  Low O2 sats.  EXAM: CHEST  2 VIEW  COMPARISON:  11/04/2014  FINDINGS: Consolidation throughout much of the right lung, particularly the right lower lobe concerning for pneumonia. Slight interstitial prominence throughout the left lung with left lower lobe atelectasis or infiltrate. Small right pleural effusion. Heart is normal size. Mediastinal contours are within normal limits.  Numerous calcified nodules throughout the lungs, left greater than right compatible with old granulomatous disease. Again noted is the displaced right humeral neck fracture.  IMPRESSION: Continued diffuse right lung airspace disease, most confluent in the right lower lobe concerning for pneumonia. Left lower lobe atelectasis or infiltrate.  Old granulomas disease.  Small right effusion.  Displaced right humeral neck fracture.   Electronically Signed   By: Charlett NoseKevin  Dover M.D.   On: 11/07/2014 08:46   Dg Chest 2 View  11/04/2014   ADDENDUM REPORT: 11/04/2014 08:45  ADDENDUM: Displaced chronic RIGHT proximal humerus fracture is incidentally noted, with positional change compared to 6/20 11/2014.    Electronically Signed   By: Andreas NewportGeoffrey  Lamke M.D.   On: 11/04/2014 08:45   11/04/2014   CLINICAL DATA:  Short of breath.  Fall.  EXAM: CHEST  2 VIEW  COMPARISON:  11/02/2014.  FINDINGS: Support apparatus: Monitoring leads project over the chest.  Cardiomediastinal Silhouette: Slightly larger than on the prior exam some of which is probably projectional.  Lungs: Slightly worse airspace disease in the RIGHT lung, with the biggest increase in the inferior RIGHT upper lobe. The airspace disease is asymmetric and there is interstitial and mild airspace opacity on the LEFT. No pneumothorax. Calcified ovoid density at the LEFT  lung base on the frontal projection is chronic and probably represents a calcified granuloma. Underlying emphysema is present, noted on prior exams along with old granulomatous disease.  Effusions:  Small RIGHT pleural effusion.  Other:  None.  IMPRESSION: Worsening RIGHT-greater-than-LEFT diffuse interstitial and airspace opacity. The findings suggest pneumonia with superimposed CHF given the increased compared to 11/01/2014.  Electronically Signed: By: Andreas Newport M.D. On: 11/04/2014 08:02   Dg Shoulder Right  10/08/2014   CLINICAL DATA:  Status post fall, with obvious swelling and deformity at the right shoulder. Initial encounter.  EXAM: RIGHT SHOULDER - 2+ VIEW  COMPARISON:  None.  FINDINGS: There is a mildly comminuted fracture involving the right humeral neck, with mild proximal displacement of the distal humerus and rotation of the humeral head. The humeral head remains seated at the glenoid fossa. Superior subluxation of the humeral head suggests an underlying chronic rotator cuff tear.  The right acromioclavicular joint is unremarkable in appearance. Known soft swelling is not well characterized on radiograph. Mild peribronchial thickening is noted at the right lung.  IMPRESSION: Mildly comminuted fracture involving the right humeral neck, with mild proximal displacement of the  distal humerus and rotation of the humeral head. Superior subluxation of the humeral head suggests an underlying chronic rotator cuff tear.   Electronically Signed   By: Roanna Raider M.D.   On: 10/08/2014 23:06   Dg Elbow Complete Right  10/08/2014   CLINICAL DATA:  Status post fall, with swelling and deformity of the right elbow. Initial encounter.  EXAM: RIGHT ELBOW - COMPLETE 3+ VIEW  COMPARISON:  None.  FINDINGS: There is no evidence of fracture or dislocation. The visualized joint spaces are preserved. No significant joint effusion is identified. The soft tissues are unremarkable in appearance.  IMPRESSION: No evidence of fracture or dislocation.   Electronically Signed   By: Roanna Raider M.D.   On: 10/08/2014 23:07   Dg Chest Port 1 View  11/02/2014   CLINICAL DATA:  Hypoxemia  EXAM: PORTABLE CHEST - 1 VIEW  COMPARISON:  Portable chest x-ray of [place] 28, 2016.  FINDINGS: The lungs are well-expanded. Progressive increase in alveolar density has appeared on the right. There is a small right pleural effusion. There is no pneumothorax. On the left the interstitial markings are slightly more conspicuous especially at the lung base. The heart and pulmonary vascularity are normal. A displaced fracture of the right humeral neck is again demonstrated.  IMPRESSION: Progressive airspace opacification on the right most compatible with pneumonia with interval development of a small right pleural effusion. Minimal atelectasis/ interstitial pneumonia at the left lung base.   Electronically Signed   By: David  [place] M.D.   On: 11/02/2014 08:58   Dg Chest Port 1 View  11/01/2014   CLINICAL DATA:  Low oxygen saturations. Larey Seat a few weeks ago. Fracture of the right shoulder. This morning oxygen in the 60s on 4 L.  EXAM: PORTABLE CHEST - 1 VIEW  COMPARISON:  10/09/2008  FINDINGS: Heart size is normal. There patchy infiltrates in lower lobes bilaterally, right greater than left. No pulmonary edema.  Significant  displacement of right humeral neck fracture noted.  IMPRESSION: Bilateral lower lobe infiltrates, right greater than left.   Electronically Signed   By: Norva Pavlov M.D.   On: 11/01/2014 10:40   Dg Humerus Right  10/08/2014   CLINICAL DATA:  Right upper arm pain, swelling, and deformity after fall.  EXAM: RIGHT HUMERUS - 2+ VIEW  COMPARISON:  None.  FINDINGS: There is a comminuted displaced fracture of the surgical neck of the humerus with anterior displacement of humeral shaft. Limited assessment of the glenohumeral extension. The distal humerus is intact.  IMPRESSION: Comminuted displaced proximal humeral fracture involving the surgical neck.   Electronically Signed   By: Rubye Oaks M.D.   On: 10/08/2014 23:08    Microbiology: Recent Results (from the past 240 hour(s))  Culture, blood (routine x 2)     Status: None   Collection Time: 11/01/14  1:50 PM  Result Value Ref Range Status   Specimen Description BLOOD RIGHT ARM  Final   Special Requests   Final    BOTTLES DRAWN AEROBIC AND ANAEROBIC BLUE 10CC RED 5CC   Culture NO GROWTH 5 DAYS  Final   Report Status 11/06/2014 FINAL  Final  Culture, blood (routine x 2)     Status: None   Collection Time: 11/01/14  1:55 PM  Result Value Ref Range Status   Specimen Description BLOOD RIGHT HAND  Final   Special Requests BOTTLES DRAWN AEROBIC ONLY 10CC  Final   Culture NO GROWTH 5 DAYS  Final   Report Status 11/06/2014 FINAL  Final  MRSA PCR Screening     Status: None   Collection Time: 11/01/14  7:35 PM  Result Value Ref Range Status   MRSA by PCR NEGATIVE NEGATIVE Final    Comment:        The GeneXpert MRSA Assay (FDA approved for NASAL specimens only), is one component of a comprehensive MRSA colonization surveillance program. It is not intended to diagnose MRSA infection nor to guide or monitor treatment for MRSA infections.      Labs: Basic Metabolic Panel:  Recent Labs Lab 11/01/14 1050 11/02/14 0901 11/03/14 0407  11/04/14 0246 11/05/14 0225 11/06/14 0925  NA 143 147* 144 148* 145  --   K 4.2 3.3* 3.7 3.5 3.0*  --   CL 109 119* 117* 115* 110  --   CO2 --   GLUCOSE 109* 75 104* 98 96  --   BUN 56* 41* 30* 22* 19  --   CREATININE 1.10* 0.84 0.77 0.73 0.67  --   CALCIUM 7.3* 6.8* 6.9* 7.5* 7.7*  --   MG  --  2.3  --   --   --  1.6*   Liver Function Tests:  Recent Labs Lab 11/01/14 1050  AST 45*  ALT 24  ALKPHOS 108  BILITOT 0.6  PROT 4.8*  ALBUMIN 1.3*   CBC:  Recent Labs Lab 11/01/14 1050 11/02/14 0901 11/03/14 0407 11/04/14 0246 11/05/14 0225  WBC 15.9* 16.1* 12.1* 10.1 11.4*  NEUTROABS 13.7*  --   --   --   --   HGB 9.7* 9.4* 8.5* 8.6* 8.8*  HCT 29.5* 29.8* 26.8* 27.6* 28.0*  MCV 86.0 87.4 87.3 87.1 86.7  PLT 592* 510* 453* 412* 391   BNP (last 3 results)  Recent Labs  11/01/14 1050  BNP 282.4*    Signed:  Vassie Loll  [facility] 11/07/2014, 3:16 PM

## 2014-11-07 NOTE — Progress Notes (Signed)
NURSING PROGRESS NOTE  [name] [id number] Discharge Data: 11/07/2014 4:39 PM Attending Provider: Vassie Lollarlos Madera, MD NWG:[id number]:Katherine Beverely Lowabori, MD   [name] to be D/C'd Skilled nursing facility per MD order.    All IV's will be discontinued and monitored for bleeding.  All belongings will be returned to patient for patient to take home.  Last Documented Vital Signs:  Blood pressure 132/93, pulse 92, temperature 97.6 F (36.4 C), temperature source Oral, resp. rate 20, height 5\' 4"  (1.626 m), weight 51.6 kg (113 lb 12.1 oz), SpO2 94 %.  Beverly RearLonnie Alfretta Pinch, MSN, RN, [employer]RN

## 2014-11-09 ENCOUNTER — Non-Acute Institutional Stay (SKILLED_NURSING_FACILITY): Payer: Medicare Other | Admitting: Internal Medicine

## 2014-11-09 DIAGNOSIS — E876 Hypokalemia: Secondary | ICD-10-CM

## 2014-11-09 DIAGNOSIS — S42301G Unspecified fracture of shaft of humerus, right arm, subsequent encounter for fracture with delayed healing: Secondary | ICD-10-CM | POA: Diagnosis not present

## 2014-11-09 DIAGNOSIS — I5033 Acute on chronic diastolic (congestive) heart failure: Secondary | ICD-10-CM | POA: Diagnosis not present

## 2014-11-09 DIAGNOSIS — J189 Pneumonia, unspecified organism: Secondary | ICD-10-CM

## 2014-11-09 NOTE — Progress Notes (Signed)
Patient ID: Beverly Black, female   DOB: 05/19/1928, 81 y.o.   MRN: [id number] MRN: [id number] Name: Beverly Black  Sex: female Age: 81 y.o. DOB: 01/25/1929  [facility]: [facility] Facility/Room:112 Level Of Care: SNF Provider: Roena MaladyLASSEN, Orah Sonnen C Emergency Contacts: Extended Emergency Contact Information Primary Emergency Contact: Beverly Black,Beverly Black Address: [address]          [place] [id number] [place] Home Phone: [phone] Relation: Daughter Secondary Emergency Contact: [name],Beverly Black  [place] Home Phone: [phone] Relation: Daughter  Code Status: FULL  Allergies: Sulfa antibiotics  Acute visit status post hospitalization for pneumonia-CHF  HPI: Patient is 81 y.o. female who is admitted to SNF  initially for OT/PT s/p arm fx and prior stroke with gait problems. She was readmitted to the hospital on [place] 26 after she was found to have hypoxia with an O2 saturation of 60% on 4 L of oxygen.  Previously she was admitted for a right humerus fracture and was in this facility for rehabilitation.  .  Chest x-ray on this hospitalization showed bilateral infiltrates right more than left-she was started on vancomycin and ceftaz-for healthcare associated pneumonia-clinically she improved. Pneumonia was thought to be complicated with component of acute on chronic diastolic CHF-she did have a good response to IV Lasix as well as nebulizers-she has been discharged on by mouth Augmentin for the pneumonia to complete a 10 day course.  Also recommendation for palliative care to follow at nursing home.  She continues on oxygen 3-4 liters.  She is also on Pulmicort duo nebs.  Regards to her right humerus fracture she is followed by an orthopedic service who have recommended conservative management for now.  Patient did have some acute kidney injury that resolved apparently by discharge I do note her potassium last note in the hospital was 3.0 she had  received IV fluids I suspect they thought this was the etiology of that but this will need to be rechecked   She is no longer receiving Lasix and was thought there was no further need at this point.  Patient also has a decubitus ulcer with recommendation to use Santyl and foam dressing to protect-she is followed by wound care    Past Medical History  Diagnosis Date  . Hypertension   . Hyperlipidemia   . Stroke 10/2008  . Aneurysm     of the eye  . Osteoporosis     Past Surgical History  Procedure Laterality Date  . Eye surgery    . Partial hysterectomy  1968      Medication List       This list is accurate as of: 11/09/14  8:14 PM.  Always use your most recent med list.               amLODipine 10 MG tablet  Commonly known as:  NORVASC  TAKE 1 TABLET AT BEDTIME     amoxicillin-clavulanate 400-57 MG/5ML suspension  Commonly known as:  AUGMENTIN  Take 10.9 mLs (875 mg total) by mouth every 12 (twelve) hours. To be taken for 5 more days     aspirin 81 MG tablet  Take 81 mg by mouth daily.     atenolol 50 MG tablet  Commonly known as:  TENORMIN  TAKE 1 TABLET TWICE A DAY     atorvastatin 40 MG tablet  Commonly known as:  LIPITOR  TAKE 1 TABLET DAILY     budesonide 0.25 MG/2ML nebulizer solution  Commonly  known as:  PULMICORT  Take 2 mLs (0.25 mg total) by nebulization 2 (two) times daily.     cholecalciferol 1000 UNITS tablet  Commonly known as:  VITAMIN D  Take 1,000 Units by mouth daily.     docusate sodium 100 MG capsule  Commonly known as:  COLACE  Take 100 mg by mouth 2 (two) times daily.     ENSURE  Take 237 mLs by mouth daily. At 2 pm     feeding supplement (ENSURE ENLIVE) Liqd  Take 237 mLs by mouth 3 (three) times daily between meals.     escitalopram 10 MG tablet  Commonly known as:  LEXAPRO  Take 10 mg by mouth daily.     fexofenadine 180 MG tablet  Commonly known as:  ALLEGRA  Take 180 mg by mouth daily.     ipratropium-albuterol  0.5-2.5 (3) MG/3ML Soln  Commonly known as:  DUONEB  Take 3 mLs by nebulization every 4 (four) hours as needed (SOB/wheezing).     LORazepam 0.5 MG tablet  Commonly known as:  ATIVAN  Take 1 tablet (0.5 mg total) by mouth every 8 (eight) hours.     mirtazapine 30 MG tablet  Commonly known as:  REMERON  TAKE 1 TABLET AT BEDTIME     oxyCODONE 5 MG immediate release tablet  Commonly known as:  Oxy IR/ROXICODONE  Take 1 tablet (5 mg total) by mouth every 6 (six) hours as needed for severe pain.     pantoprazole 40 MG tablet  Commonly known as:  PROTONIX  Take 1 tablet (40 mg total) by mouth 2 (two) times daily.     polyethylene glycol packet  Commonly known as:  MIRALAX / GLYCOLAX  Take 17 g by mouth daily. To be hold for diarrhea     PREMARIN 0.3 MG tablet  Generic drug:  estrogens (conjugated)  TAKE 1 TABLET DAILY FOR 21 DAYS THEN DO NOT TAKE FOR 7 DAYS     PRESCRIPTION MEDICATION  Pt to take 1 two times a day to promote weight gain. MAGIC CUP     traMADol 50 MG tablet  Commonly known as:  ULTRAM  Take 1 tablet (50 mg total) by mouth every 6 (six) hours as needed for moderate pain.     ZETIA 10 MG tablet  Generic drug:  ezetimibe  TAKE 1 TABLET DAILY        No orders of the defined types were placed in this encounter.    Immunization History  Administered Date(s) Administered  . Influenza,inj,Quad PF,36+ Mos 04/16/2013, 05/06/2014    History  Substance Use Topics  . Smoking status: Current Every Day Smoker -- 0.50 packs/day for 50 years  . Smokeless tobacco: Not on file  . Alcohol Use: No    Family history is noncontributory    Review of Systems  DATA OBTAINED: from patient, nurse GENERAL:  no fevers, fatigue, appetite changes SKIN: No itching, rash --has decubitus ulcer as noted above EYES: No eye pain, redness, discharge EARS: No earache, tinnitus, change in hearing NOSE: No congestion, drainage or bleeding  MOUTH/THROAT: No mouth or tooth pain, No  sore throat RESPIRATORY: No cough, wheezing, SOB recent hospitalization for pneumonia comfort care with CHF CARDIAC: No chest pain, palpitations, lower extremity edema  GI: No abdominal pain, No N/V/D or constipation, No heartburn or reflux  GU: No dysuria, frequency or urgency, or incontinence  MUSCULOSKELETAL: Does complain of right shoulder discomfort status post humerus fracture would like something topical  to help NEUROLOGIC: No headache, dizziness  PSYCHIATRIC: No overt anxiety or sadness, No behavior issue.     Physical Exam She is afebrile pulse of 88 respirations 18 blood pressure115/70 O2 sat patient was in the 90s on oxygen GENERAL APPEARANCE: Alert, conversant,  No acute distress lying comfortably in bed.  SKIN: No diaphoresis rash history decubitus ulcer this is followed by wound care HEAD: Normocephalic, atraumatic  EYES: Conjunctiva/lids clear. Pupils round, reactive. EOMs intact.  EARS: External exam WNL, canals clear. Hearing grossly normal.  NOSE: No deformity or discharge.  MOUTH/THROAT: Oropharynx is clear mucous membranes moist RESPIRATORY: Breathing is even, unlabored. Lung sounds are clear shallow air entry  CARDIOVASCULAR: Heart RRR no murmurs, rubs or gallops. No peripheral edema.   GASTROINTESTINAL: Abdomen is soft, non-tender, not distended w/ normal bowel sounds. GENITOURINARY: Bladder non tender, not distended  MUSCULOSKELETAL: R arm in sling--grip strength intact....radial pulse intact there is old bruising right shoulder area with some tenderness to palpation NEUROLOGIC:  Cranial nerves 2-12 grossly intact  PSYCHIATRIC: Mood and affect appropriate to situation, no behavioral issues does have confusion which appears to be baseline she follows verbal instructions  Patient Active Problem List   Diagnosis Date Noted  . Hypokalemia 11/09/2014  . COPD exacerbation   . Acute on chronic diastolic heart failure   . Hypoxia   . SOB (shortness of breath)   .  Pressure ulcer 11/02/2014  . Palliative care encounter 11/02/2014  . HCAP (healthcare-associated pneumonia) 11/01/2014  . Leukocytosis 10/15/2014  . Humerus fracture 10/14/2014  . Poor balance 11/25/2013  . History of stroke 11/25/2013  . 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 Myalgia 09/05/2012  . Disequilibrium 09/05/2012  . Hypertension, essential 09/05/2012  . 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 Mobility impaired 07/15/2012  . Physical exam, routine 12/07/2011  . Cerumen impaction 04/04/2011  . Trapezius muscle spasm 04/04/2011  . HTN (hypertension) 11/30/2010  . Hyperlipidemia 11/30/2010  . CVA (cerebral infarction) 11/30/2010  . Postmenopausal HRT (hormone replacement therapy) 11/30/2010  . Anxiety 11/30/2010  . Ankle weakness 11/30/2010    11/01/2014.  Liver function tests are within normal limits except AST of 45 albumen of 1.3.  11/05/2014.  WBC 11.4 platelets 391 hemoglobin 8.8.  Sodium 145 potassium 3 BUN 19 creatinine 0.67.  11/06/2014-magnesium 1.6 and had been within normal range earlier in her hospital stay  CBC    Component Value Date/Time   WBC 11.4* 11/05/2014 0225   RBC 3.23* 11/05/2014 0225   HGB 8.8* 11/05/2014 0225   HCT 28.0* 11/05/2014 0225   PLT 391 11/05/2014 0225   MCV 86.7 11/05/2014 0225   LYMPHSABS 1.1 11/01/2014 1050   MONOABS 1.1* 11/01/2014 1050   EOSABS 0.0 11/01/2014 1050   BASOSABS 0.0 11/01/2014 1050    CMP     Component Value Date/Time   NA 145 11/05/2014 0225   K 3.0* 11/05/2014 0225   CL 110 11/05/2014 0225   CO2 28 11/05/2014 0225   GLUCOSE 96 11/05/2014 0225   BUN 19 11/05/2014 0225   CREATININE 0.67 11/05/2014 0225   CALCIUM 7.7* 11/05/2014 0225   PROT 4.8* 11/01/2014 1050   ALBUMIN 1.3* 11/01/2014 1050   AST 45* 11/01/2014 1050   ALT 24 11/01/2014 1050   ALKPHOS 108 11/01/2014 1050   BILITOT 0.6 11/01/2014 1050   GFRNONAA >60 11/05/2014 0225   GFRAA >60 11/05/2014 0225    Assessment and Plan #1  history of acute hypoxic respiratory failure due to pneumonia with component of acute on  chronic diastolic CHF.  At this point continue with nebulizer treatments including Pulmicort-she did receive IV Lasix in the hospital this has been discontinued I suspect this could be at least partly the reason for her low potassium-.  Will start low-dose potassium 20 mEq a day this will have to be rechecked first thing in the morning to see where we stand I suspect this will improve with the discontinuance of Lasix.  She does continue on Augmentin for a complete 10 day course --was treated with IV antibiotics in the hospital and clinically improved at this point she appears to be stable.  #2-history of right humerus fracture this will need orthopedic follow-up this appears to be at baseline again conservative treatment per orthopedic recommendation she is receiving OxyIR 5 mg every 6 hours for pain as well as tramadol every 6 hours when necessary-patient would like something topical will order bio freeze twice a day when necessary as well.  #3 history of acute kidney injury this appeared to have largely resolved by discharge with a creatinine of 0.67 BUN of 19 on [place] 30 will update this tomorrow as well.  #4-history of hypertension at this point appears stable although well have to remember monitored she is on atenolol as well as Norvasc.  #5-history hyperlipidemia she continues on Lipitor as well as Zetia.  #6 history of anxiety she continues on Ativan this appears to be stable currently.  #7-history of CVA she is on aspirin for anticoagulation she continues to be a fall risk apparently.  #8-history depression she continues on Lexapro I also note she is on Remeron as suspect Remeron may be for appetite stimulation I do not an albumin of 1.3 which is significant.  #9-anemia suspect an element of chronic disease most recent hemoglobin of 8.8 this will need to be rechecked.  [id number] note greater  than 45 minutes spent assessing patient-reviewing her chart-in coordinating and formulating a plan of care for numerous diagnoses-of note greater than 50% of time spent corning air       Olisa Quesnel C,

## 2014-11-11 ENCOUNTER — Encounter: Payer: Self-pay | Admitting: Internal Medicine

## 2014-11-11 ENCOUNTER — Telehealth: Payer: Self-pay | Admitting: *Deleted

## 2014-11-11 ENCOUNTER — Non-Acute Institutional Stay (SKILLED_NURSING_FACILITY): Payer: Medicare Other | Admitting: Internal Medicine

## 2014-11-11 DIAGNOSIS — J9601 Acute respiratory failure with hypoxia: Secondary | ICD-10-CM | POA: Diagnosis not present

## 2014-11-11 DIAGNOSIS — J189 Pneumonia, unspecified organism: Secondary | ICD-10-CM | POA: Diagnosis not present

## 2014-11-11 DIAGNOSIS — I5033 Acute on chronic diastolic (congestive) heart failure: Secondary | ICD-10-CM | POA: Diagnosis not present

## 2014-11-11 DIAGNOSIS — S42201G Unspecified fracture of upper end of right humerus, subsequent encounter for fracture with delayed healing: Secondary | ICD-10-CM

## 2014-11-11 DIAGNOSIS — E870 Hyperosmolality and hypernatremia: Secondary | ICD-10-CM | POA: Diagnosis not present

## 2014-11-11 DIAGNOSIS — N179 Acute kidney failure, unspecified: Secondary | ICD-10-CM | POA: Diagnosis not present

## 2014-11-11 DIAGNOSIS — L8990 Pressure ulcer of unspecified site, unspecified stage: Secondary | ICD-10-CM

## 2014-11-11 NOTE — Assessment & Plan Note (Signed)
due to PNA and component of acute on chronic diastolic heart failure Continue with nebulizer treatments; including pulmicort Good response to extra fluid/vascular congestion; treated with IV lasix Repeated CXR in am of discharge demonstrated still infiltrates process, no vascular congestion seen  After 5 days of IV abx's; will transition to PO augmentin and complete a total of 10 days  Appreciate palliative care inputs and recommendations. They will continue to follow patient at SNF and make further adjustments to her treatment base on response.  Doppler Lower extremities negative for PE.  Will discharge on 3-4 L O2 supplementation; will continue titration down to RA as tolerated

## 2014-11-11 NOTE — Assessment & Plan Note (Addendum)
Right arm in sling, patient has been seen by orthopedic service as outpatient, and they recommended conservative management for now -had upcoming appointment to follow on her right humerus fracture and per daughter it was found to be not healing

## 2014-11-11 NOTE — Assessment & Plan Note (Signed)
Monitor volume status, check daily weights. Received IV lasix X 2 days with improvement/resolution in her impaired breathing Heart healthy/low sodium diet No needs for maintenance lasix at this moment

## 2014-11-11 NOTE — Assessment & Plan Note (Signed)
Due to initial pre-renal azotemia with infection and poor oral intake -resolved by time of discharge after IVF's -advise to maintain adequate hydration

## 2014-11-11 NOTE — Progress Notes (Addendum)
MRN: [id number] Name: Beverly Black  Sex: female Age: 81 y.o. DOB: 27-Jun-1928  [name] Facility/Room:515 Level Of Care: SNF Provider: Merrilee Seashore D Emergency Contacts: Extended Emergency Contact Information Primary Emergency Contact: [name],[name] Address: [address]          [name] [address] [place] Home Phone: [phone] Relation: Daughter Secondary Emergency Contact: [name] Home Phone: [phone] Relation: Daughter  Code Status:   Allergies: Sulfa antibiotics  Chief Complaint  Patient presents with  . New Admit To SNF    HPI: Patient is 81 y.o. female who in the past month has broken her arm, sent home, and  been hospitalized for PNA and respiratory failure. Pt is admitted to SNF with generalized weakness and inability to be cared for at home even with family.  Past Medical History  Diagnosis Date  . Hypertension   . Hyperlipidemia   . Stroke 10/2008  . Aneurysm     of the eye  . Osteoporosis     Past Surgical History  Procedure Laterality Date  . Eye surgery    . Partial hysterectomy  1968      Medication List       This list is accurate as of: 11/11/14  8:14 PM.  Always use your most recent med list.               amLODipine 10 MG tablet  Commonly known as:  NORVASC  TAKE 1 TABLET AT BEDTIME     amoxicillin-clavulanate 400-57 MG/5ML suspension  Commonly known as:  AUGMENTIN  Take 10.9 mLs (875 mg total) by mouth every 12 (twelve) hours. To be taken for 5 more days     aspirin 81 MG tablet  Take 81 mg by mouth daily.     atenolol 50 MG tablet  Commonly known as:  TENORMIN  TAKE 1 TABLET TWICE A DAY     atorvastatin 40 MG tablet  Commonly known as:  LIPITOR  TAKE 1 TABLET DAILY     budesonide 0.25 MG/2ML nebulizer solution  Commonly known as:  PULMICORT  Take 2 mLs (0.25 mg total) by nebulization 2 (two) times daily.     cholecalciferol 1000 UNITS tablet   Commonly known as:  VITAMIN D  Take 1,000 Units by mouth daily.     docusate sodium 100 MG capsule  Commonly known as:  COLACE  Take 100 mg by mouth 2 (two) times daily.     ENSURE  Take 237 mLs by mouth daily. At 2 pm     feeding supplement (ENSURE ENLIVE) Liqd  Take 237 mLs by mouth 3 (three) times daily between meals.     escitalopram 10 MG tablet  Commonly known as:  LEXAPRO  Take 10 mg by mouth daily.     fexofenadine 180 MG tablet  Commonly known as:  ALLEGRA  Take 180 mg by mouth daily.     ipratropium-albuterol 0.5-2.5 (3) MG/3ML Soln  Commonly known as:  DUONEB  Take 3 mLs by nebulization every 4 (four) hours as needed (SOB/wheezing).     LORazepam 0.5 MG tablet  Commonly known as:  ATIVAN  Take 1 tablet (0.5 mg total) by mouth every 8 (eight) hours.     mirtazapine 30 MG tablet  Commonly known as:  REMERON  TAKE 1 TABLET AT BEDTIME     oxyCODONE 5 MG immediate release tablet  Commonly known as:  Oxy IR/ROXICODONE  Take 1  tablet (5 mg total) by mouth every 6 (six) hours as needed for severe pain.     pantoprazole 40 MG tablet  Commonly known as:  PROTONIX  Take 1 tablet (40 mg total) by mouth 2 (two) times daily.     polyethylene glycol packet  Commonly known as:  MIRALAX / GLYCOLAX  Take 17 g by mouth daily. To be hold for diarrhea     PREMARIN 0.3 MG tablet  Generic drug:  estrogens (conjugated)  TAKE 1 TABLET DAILY FOR 21 DAYS THEN DO NOT TAKE FOR 7 DAYS     PRESCRIPTION MEDICATION  Pt to take 1 two times a day to promote weight gain. MAGIC CUP     traMADol 50 MG tablet  Commonly known as:  ULTRAM  Take 1 tablet (50 mg total) by mouth every 6 (six) hours as needed for moderate pain.     ZETIA 10 MG tablet  Generic drug:  ezetimibe  TAKE 1 TABLET DAILY        No orders of the defined types were placed in this encounter.    Immunization History  Administered Date(s) Administered  . Influenza,inj,Quad PF,36+ Mos 04/16/2013, 05/06/2014     History  Substance Use Topics  . Smoking status: Current Every Day Smoker -- 0.50 packs/day for 50 years  . Smokeless tobacco: Not on file  . Alcohol Use: No    Family history is noncontributory    Review of Systems  DATA OBTAINED: from patient, nurse,  family member GENERAL:  no fevers,  Improved appetite  SKIN: No itching, rash EYES: No eye pain, redness, discharge EARS: No earache, tinnitus, change in hearing NOSE: No congestion, drainage or bleeding  MOUTH/THROAT: No mouth or tooth pain, No sore throat RESPIRATORY: No cough, wheezing, SOB CARDIAC: No chest pain, palpitations, lower extremity edema  GI: No abdominal pain, No N/V/D or constipation, No heartburn or reflux  GU: No dysuria, frequency or urgency, or incontinence  MUSCULOSKELETAL: No unrelieved bone/joint pain NEUROLOGIC: No headache, dizziness  PSYCHIATRIC: No overt anxiety or sadness, No behavior issue.   Filed Vitals:   11/11/14 1408  BP: 130/67  Pulse: 70  Temp: 98 F (36.7 C)  Resp: 18    Physical Exam  GENERAL APPEARANCE: Alert, conversant,  No acute distress.  SKIN: No diaphoresis rash HEAD: Normocephalic, atraumatic  EYES: Conjunctiva/lids clear. Pupils round, reactive. EOMs intact.  EARS: External exam WNL, canals clear. Hearing grossly normal.  NOSE: No deformity or discharge.  MOUTH/THROAT: Lips w/o lesions  RESPIRATORY: Breathing is even, unlabored. Lung sounds are clear;O2 at 3.5 L   CARDIOVASCULAR: Heart RRR no murmurs, rubs or gallops. No peripheral edema.   GASTROINTESTINAL: Abdomen is soft, non-tender, not distended w/ normal bowel sounds. GENITOURINARY: Bladder non tender, not distended  MUSCULOSKELETAL: R arm in sling NEUROLOGIC:  Cranial nerves 2-12 grossly intact. Moves all extremities  PSYCHIATRIC: dementia, no behavioral issues  Patient Active Problem List   Diagnosis Date Noted  . Acute respiratory failure with hypoxia 11/11/2014  . AKI (acute kidney injury) 11/11/2014   . Hypernatremia 11/11/2014  . Hypokalemia 11/09/2014  . COPD exacerbation   . Acute on chronic diastolic heart failure   . Hypoxia   . SOB (shortness of breath)   . Pressure ulcer stage II 11/02/2014  . Palliative care encounter 11/02/2014  . HCAP (healthcare-associated pneumonia) 11/01/2014  . Leukocytosis 10/15/2014  . Closed fracture of part of upper end of humerus 10/14/2014  . Poor balance 11/25/2013  . History  of stroke 11/25/2013  . 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 Myalgia 09/05/2012  . Disequilibrium 09/05/2012  . Hypertension, essential 09/05/2012  . 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 Mobility impaired 07/15/2012  . Physical exam, routine 12/07/2011  . Cerumen impaction 04/04/2011  . Trapezius muscle spasm 04/04/2011  . HTN (hypertension) 11/30/2010  . Hyperlipidemia 11/30/2010  . CVA (cerebral infarction) 11/30/2010  . Postmenopausal HRT (hormone replacement therapy) 11/30/2010  . Anxiety 11/30/2010  . Ankle weakness 11/30/2010    CBC    Component Value Date/Time   WBC 11.4* 11/05/2014 0225   RBC 3.23* 11/05/2014 0225   HGB 8.8* 11/05/2014 0225   HCT 28.0* 11/05/2014 0225   PLT 391 11/05/2014 0225   MCV 86.7 11/05/2014 0225   LYMPHSABS 1.1 11/01/2014 1050   MONOABS 1.1* 11/01/2014 1050   EOSABS 0.0 11/01/2014 1050   BASOSABS 0.0 11/01/2014 1050    CMP     Component Value Date/Time   NA 145 11/05/2014 0225   K 3.0* 11/05/2014 0225   CL 110 11/05/2014 0225   CO2 28 11/05/2014 0225   GLUCOSE 96 11/05/2014 0225   BUN 19 11/05/2014 0225   CREATININE 0.67 11/05/2014 0225   CALCIUM 7.7* 11/05/2014 0225   PROT 4.8* 11/01/2014 1050   ALBUMIN 1.3* 11/01/2014 1050   AST 45* 11/01/2014 1050   ALT 24 11/01/2014 1050   ALKPHOS 108 11/01/2014 1050   BILITOT 0.6 11/01/2014 1050   GFRNONAA >60 11/05/2014 0225   GFRAA >60 11/05/2014 0225    Assessment and Plan  Acute respiratory failure with hypoxia due to PNA and component of acute on  chronic diastolic heart failure Continue with nebulizer treatments; including pulmicort Good response to extra fluid/vascular congestion; treated with IV lasix Repeated CXR in am of discharge demonstrated still infiltrates process, no vascular congestion seen  After 5 days of IV abx's; will transition to PO augmentin and complete a total of 10 days  Appreciate palliative care inputs and recommendations. They will continue to follow patient at SNF and make further adjustments to her treatment base on response.  Doppler Lower extremities negative for PE.  Will discharge on 3-4 L O2 supplementation; will continue titration down to RA as tolerated    HCAP (healthcare-associated pneumonia) Has received 5 days of broad spectrum antibiotics (vancomycin, Fortaz and azithromycin) Now afebrile and with WBC's pretty much in normal range.  Will transition to PO antibiotics (augmentin) and will complete 5 more days of treatment Legionella and strep antigen negative..  Will discharge on pulmicort, atrovent/albuterol nebs and flutter valve   Closed fracture of part of upper end of humerus Right arm in sling, patient has been seen by orthopedic service as outpatient, and they recommended conservative management for now -had upcoming appointment to follow on her right humerus fracture and per daughter it was found to be not healing  AKI (acute kidney injury) Due to initial pre-renal azotemia with infection and poor oral intake -resolved by time of discharge after IVF's -advise to maintain adequate hydration  Acute on chronic diastolic heart failure Monitor volume status, check daily weights. Received IV lasix X 2 days with improvement/resolution in her impaired breathing Heart healthy/low sodium diet No needs for maintenance lasix at this moment  Hypernatremia Resolved with IVF  Pressure ulcer stage II Sacrum; continue prevention care and use of Santyl ointment to chemically debride  nonviable tissue to unstageable areas. Foam dressing to protect and repel moisture. Discussed plan of care with patient and she verbalized understanding.  [name], MD

## 2014-11-11 NOTE — Assessment & Plan Note (Signed)
Resolved with IVF 

## 2014-11-11 NOTE — Telephone Encounter (Signed)
Unable to reach patient or family at numbers listed above.   Patient appears to be admitted to SNF with MD covering.   Will defer hospital follow-up at this time.

## 2014-11-11 NOTE — Assessment & Plan Note (Signed)
Has received 5 days of broad spectrum antibiotics (vancomycin, Fortaz and azithromycin) Now afebrile and with WBC's pretty much in normal range.  Will transition to PO antibiotics (augmentin) and will complete 5 more days of treatment Legionella and strep antigen negative..  Will discharge on pulmicort, atrovent/albuterol nebs and flutter valve

## 2014-11-11 NOTE — Assessment & Plan Note (Addendum)
Sacrum; continue prevention care and use of Santyl ointment to chemically debride nonviable tissue to unstageable areas. Foam dressing to protect and repel moisture. Discussed plan of care with patient and she verbalized understanding.

## 2014-11-20 ENCOUNTER — Other Ambulatory Visit: Payer: Self-pay | Admitting: Family Medicine

## 2014-11-20 NOTE — Telephone Encounter (Signed)
Med filled.  

## 2014-11-25 ENCOUNTER — Other Ambulatory Visit: Payer: Self-pay | Admitting: Family Medicine

## 2014-11-25 NOTE — Telephone Encounter (Signed)
Med filled.  

## 2014-11-26 ENCOUNTER — Encounter: Payer: Self-pay | Admitting: Neurology

## 2014-11-26 ENCOUNTER — Ambulatory Visit (INDEPENDENT_AMBULATORY_CARE_PROVIDER_SITE_OTHER): Payer: Medicare Other | Admitting: Neurology

## 2014-11-26 VITALS — Ht 64.0 in

## 2014-11-26 DIAGNOSIS — I6990 Unspecified sequelae of unspecified cerebrovascular disease: Secondary | ICD-10-CM | POA: Diagnosis not present

## 2014-11-26 NOTE — Progress Notes (Signed)
PATIENT: Beverly Black DOB: 01-Nov-1928  REASON FOR VISIT: routine follow up for stroke HISTORY FROM: patient and daughter  HISTORY OF PRESENT ILLNESS: 81 y.o.-year-old lady with a left thalamic and corona radiata stroke in [place] 2010 from small vessel disease. Vascular risk factors of hypertension, hyperlipidemia, long-time smoking, and age.   She returns for followup after last visit on 09/07/12 with Dr. Pearlean Brownie. She continues to do well without recurrence stroke or TIA symptoms. She is tolerating aspirin well without bleeding, bruising. Recent labs show total cholesterol of 139 and LDL of 65.  She continues to have poor balance but denies falls recently.  She uses a cane when away from home.  She lives at home alone with a daughter living close by.  She has never driven a car. She states blood pressure is well controlled though it is slightly elevated at 146/75 in the office today. She does not check her blood pressure at home. She does have mild post stroke parkinsonian symptoms in the form of bradykinesia, poor balance, and shuffling gait but denies any resting or action tremors or drooling of saliva.  She continues to smoke and is not interested in quitting. Update 11/26/2014 : She is seen for follow-up after last visit 1 year ago. She is unfortunately had significant decline in her health. She's had multiple hospitalization and is currently in a nursing home. I have personally reviewed her recent hospital records and start diagnostic studies. She was admitted with a fall and right shoulder fracture and after prolonged hospitalization as discharge but she was readmitted with the hypoxia and is now on home oxygen. She has not had any recurrent stroke or TIA symptoms. She is currently not ambulating. There is no family complete her today. She remains on aspirin for stroke prevention. She states her blood pressure has been under good control. ROS:  14 system review of systems is positive for walking  difficulty, shortness of breath, recent hospitalization, shoulder fracture, arm pain  ALLERGIES: Allergies  Allergen Reactions  . Sulfa Antibiotics     Cant remember reaction    HOME MEDICATIONS: Outpatient Prescriptions Prior to Visit  Medication Sig Dispense Refill  . amLODipine (NORVASC) 10 MG tablet TAKE 1 TABLET AT BEDTIME 90 tablet 1  . aspirin 81 MG tablet Take 81 mg by mouth daily.      [name] atenolol (TENORMIN) 50 MG tablet TAKE 1 TABLET TWICE A DAY 180 tablet 0  . atorvastatin (LIPITOR) 40 MG tablet TAKE 1 TABLET DAILY 90 tablet 0  . budesonide (PULMICORT) 0.25 MG/2ML nebulizer solution Take 2 mLs (0.25 mg total) by nebulization 2 (two) times daily. 60 mL 12  . cholecalciferol (VITAMIN D) 1000 UNITS tablet Take 1,000 Units by mouth daily.    [name] docusate sodium (COLACE) 100 MG capsule Take 100 mg by mouth 2 (two) times daily.    [name] ENSURE (ENSURE) Take 237 mLs by mouth daily. At 2 pm    . escitalopram (LEXAPRO) 10 MG tablet Take 10 mg by mouth daily.    . feeding supplement, ENSURE ENLIVE, (ENSURE ENLIVE) LIQD Take 237 mLs by mouth 3 (three) times daily between meals. 237 mL 12  . fexofenadine (ALLEGRA) 180 MG tablet Take 180 mg by mouth daily.    [name] ipratropium-albuterol (DUONEB) 0.5-2.5 (3) MG/3ML SOLN Take 3 mLs by nebulization every 4 (four) hours as needed (SOB/wheezing).    . LORazepam (ATIVAN) 0.5 MG tablet Take 1 tablet (0.5 mg total) by mouth every 8 (eight)  hours. 30 tablet 0  . mirtazapine (REMERON) 30 MG tablet TAKE 1 TABLET AT BEDTIME 90 tablet 1  . oxyCODONE (OXY IR/ROXICODONE) 5 MG immediate release tablet Take 1 tablet (5 mg total) by mouth every 6 (six) hours as needed for severe pain. 30 tablet 0  . pantoprazole (PROTONIX) 40 MG tablet Take 1 tablet (40 mg total) by mouth 2 (two) times daily.    . polyethylene glycol (MIRALAX / GLYCOLAX) packet Take 17 g by mouth daily. To be hold for diarrhea 14 each 0  . PREMARIN 0.3 MG tablet TAKE 1 TABLET DAILY FOR 21 DAYS, THEN DO  NOT TAKE FOR 7 DAYS 90 tablet 2  . PRESCRIPTION MEDICATION Pt to take 1 two times a day to promote weight gain. MAGIC CUP    . traMADol (ULTRAM) 50 MG tablet Take 1 tablet (50 mg total) by mouth every 6 (six) hours as needed for moderate pain. 15 tablet 0  . ZETIA 10 MG tablet TAKE 1 TABLET DAILY 90 tablet 0   No facility-administered medications prior to visit.    PHYSICAL EXAM Filed Vitals:   11/26/14 1105  Height:  (1.626 m)   There is no weight on file to calculate BMI.  Physical Exam  General: well developed, well nourished, seated, in no evident distress  Head: head normocephalic and atraumatic.  Neck: supple with no carotid or supraclavicular bruits  Cardiovascular: regular rate and rhythm, no murmurs  Musculoskeletal: no deformity. Mild kyphosis  Skin: no rash/petichiae  Vascular: Normal pulses all extremities   Neurologic Exam  Mental Status: Awake and fully alert. Oriented to place and time. Recent and remote memory intact. Diminished recall 1/3. Has difficulty following commands.  Mood and affect appropriate. Positive glabellar tap. Palmomental reflex present on the right but absent on the left.  Cranial Nerves: Fundoscopic exam not done. Pupils equal, briskly reactive to light. Extraocular movements full without nystagmus. Visual fields full to confrontation. Hearing intact. Facial sensation intact. Face, tongue, palate moves normally and symmetrically.  Motor: Normal bulk and tone. Normal strength in all tested extremity muscles. No resting or action tremor. Mild cogwheel rigidity in both wrists.Rt shoulder movements limited due to fracture and sling Sensory: intact to touch and pinprick and vibratory.  Coordination:not tested Gait and Station: lying on the gurney strapped. Hence not tested. Reflexes: 1+ and symmetric. Toes downgoing.   ASSESSMENT: 81 y.o. year old lady with a left thalamic and corona radiata stroke in [place] 2010 from small vessel disease. Vascular  risk factors of hypertension, hyperlipidemia and age. Mild post stroke parkinsonism.   PLAN:  I had a long discussion with the patient regarding her remote stroke and recommend she continue aspirin for stroke prevention and strict control of hypertension with blood pressure goal below 130/90 and lipids with LDL cholesterol goal below 70 mg percent. Continue physical and occupational therapy. No routine neurological follow-up appointment is necessary. She may return for follow-up in the future only as necessary.   No orders of the defined types were placed in this encounter.   Return if symptoms worsen or fail to improve.  Delia Heady, MD  11/26/2014, 12:20 PM [facility] [address] [place], [place] [address] [phone]  Note: This document was prepared with digital dictation and possible smart phrase technology. Any transcriptional errors that result from this process are unintentional.

## 2014-11-26 NOTE — Patient Instructions (Signed)
I had a long discussion with the patient regarding her remote stroke and recommend she continue aspirin for stroke prevention and strict control of hypertension with blood pressure goal below 130/90 and lipids with LDL cholesterol goal below 70 mg percent. Continue physical and occupational therapy. No routine neurological follow-up appointment is necessary. She may return for follow-up in the future only as necessary.

## 2014-11-30 ENCOUNTER — Non-Acute Institutional Stay (SKILLED_NURSING_FACILITY): Payer: Medicare Other | Admitting: Internal Medicine

## 2014-11-30 ENCOUNTER — Encounter: Payer: Self-pay | Admitting: Internal Medicine

## 2014-11-30 DIAGNOSIS — R410 Disorientation, unspecified: Secondary | ICD-10-CM

## 2014-11-30 DIAGNOSIS — F05 Delirium due to known physiological condition: Secondary | ICD-10-CM | POA: Diagnosis not present

## 2014-11-30 DIAGNOSIS — R109 Unspecified abdominal pain: Secondary | ICD-10-CM

## 2014-11-30 DIAGNOSIS — J189 Pneumonia, unspecified organism: Secondary | ICD-10-CM

## 2014-11-30 DIAGNOSIS — K30 Functional dyspepsia: Secondary | ICD-10-CM

## 2014-11-30 DIAGNOSIS — S42201D Unspecified fracture of upper end of right humerus, subsequent encounter for fracture with routine healing: Secondary | ICD-10-CM | POA: Diagnosis not present

## 2014-11-30 NOTE — Progress Notes (Signed)
Patient ID: Makenly N Heagle, female   DOB: 1928/11/02, 81 y.o.   MRN: [id number] MRN: [id number] Name: Beverly Black  Sex: female Age: 81 y.o. DOB: 02-27-1929  [name] Facility/Room:515 Level Of Care: SNF Provider: Roena Malady Emergency Contacts: Extended Emergency Contact Information Primary Emergency Contact: [name],[name] Address: [address]          [name] [address] [place] Home Phone: [phone] Relation: Daughter Secondary Emergency Contact: [name] [place] Home Phone: [phone] Relation: Daughter  Code Status:   Allergies: Sulfa antibiotics  Chief Complaint  Patient presents with  . Acute Visit    HPI: Patient is 81 y.o. female who in the past month has broken her arm, sent home, and  been hospitalized for PNA and respiratory failure. Pt was admitted to SNF with generalized weakness and inability to be cared for at home even with family Apparently patient has been complaining of feeling constipated-somewhat on feeling of abdominal fullness- apparently she did have a fairly good-sized BM today however.  Patient was hospitalized for pneumonia and did complain antibody-her daughter's concern that possibly there may be some residual pneumonia and chest congestion-she has been afebrile and does not complain of shortness of breath at this time.  Her daughter also is concerned that patient may have some progressive dementia-although she does not have a formal diagnosis of this daughter feels that the over some time she's become somewhat more confused especially since her recent hospitalizations.  Currently patient has no acute complaints other than abdominal issues as noted above.  Past Medical History  Diagnosis Date  . Hypertension   . Hyperlipidemia   . Stroke 10/2008  . Aneurysm     of the eye  . Osteoporosis     Past Surgical History  Procedure Laterality Date  . Eye surgery    . Partial  hysterectomy  1968      Medication List       This list is accurate as of: 11/30/14 11:59 PM.  Always use your most recent med list.               amLODipine 10 MG tablet  Commonly known as:  NORVASC  TAKE 1 TABLET AT BEDTIME     aspirin 81 MG tablet  Take 81 mg by mouth daily.     atenolol 50 MG tablet  Commonly known as:  TENORMIN  TAKE 1 TABLET TWICE A DAY     atorvastatin 40 MG tablet  Commonly known as:  LIPITOR  TAKE 1 TABLET DAILY     BIOFREEZE 4 % Gel  Generic drug:  Menthol (Topical Analgesic)  Apply 1 application topically 2 (two) times daily.     budesonide 0.25 MG/2ML nebulizer solution  Commonly known as:  PULMICORT  Take 2 mLs (0.25 mg total) by nebulization 2 (two) times daily.     cholecalciferol 1000 UNITS tablet  Commonly known as:  VITAMIN D  Take 1,000 Units by mouth daily.     docusate sodium 100 MG capsule  Commonly known as:  COLACE  Take 100 mg by mouth 2 (two) times daily.     ENSURE  Take 237 mLs by mouth daily. At 2 pm     feeding supplement (ENSURE ENLIVE) Liqd  Take 237 mLs by mouth 3 (three) times daily between meals.     escitalopram 10 MG tablet  Commonly known as:  LEXAPRO  Take 10 mg by mouth daily.  fexofenadine 180 MG tablet  Commonly known as:  ALLEGRA  Take 180 mg by mouth daily.     ipratropium-albuterol 0.5-2.5 (3) MG/3ML Soln  Commonly known as:  DUONEB  Take 3 mLs by nebulization every 4 (four) hours as needed (SOB/wheezing).     LORazepam 0.5 MG tablet  Commonly known as:  ATIVAN  Take 1 tablet (0.5 mg total) by mouth every 8 (eight) hours.     mirtazapine 30 MG tablet  Commonly known as:  REMERON  TAKE 1 TABLET AT BEDTIME     MULTIVITAMIN ADULT PO  Take 1 tablet by mouth daily.     oxyCODONE 5 MG immediate release tablet  Commonly known as:  Oxy IR/ROXICODONE  Take 1 tablet (5 mg total) by mouth every 6 (six) hours as needed for severe pain.     pantoprazole 40 MG tablet  Commonly known as:   PROTONIX  Take 1 tablet (40 mg total) by mouth 2 (two) times daily.     polyethylene glycol packet  Commonly known as:  MIRALAX / GLYCOLAX  Take 17 g by mouth daily. To be hold for diarrhea     Potassium Chloride ER 20 MEQ Tbcr  Take 1 tablet by mouth daily.     PREMARIN 0.3 MG tablet  Generic drug:  estrogens (conjugated)  TAKE 1 TABLET DAILY FOR 21 DAYS, THEN DO NOT TAKE FOR 7 DAYS     PRESCRIPTION MEDICATION  Pt to take 1 two times a day to promote weight gain. MAGIC CUP     traMADol 50 MG tablet  Commonly known as:  ULTRAM  Take 1 tablet (50 mg total) by mouth every 6 (six) hours as needed for moderate pain.     ZETIA 10 MG tablet  Generic drug:  ezetimibe  TAKE 1 TABLET DAILY        No orders of the defined types were placed in this encounter.    Immunization History  Administered Date(s) Administered  . Influenza,inj,Quad PF,36+ Mos 04/16/2013, 05/06/2014    History  Substance Use Topics  . Smoking status: Current Every Day Smoker -- 0.50 packs/day for 50 years  . Smokeless tobacco: Not on file  . Alcohol Use: No    Family history is noncontributory    Review of Systems  DATA OBTAINED: from patient, nurse,  family member GENERAL:  no fevers,  I  SKIN: No itching, rash EYES: No eye pain, redness, discharge EARS: No earache, tinnitus, change in hearing NOSE: No congestion, drainage or bleeding  MOUTH/THROAT: No mouth or tooth pain, No sore throat RESPIRATORY: Does not really complain of cough or shortness of breath CARDIAC: No chest pain, palpitations, lower extremity edema  GI: No abdominal pain, but has a feeling of abdominal fullness and constipation-did have a bowel movement today  GU: No dysuria, frequency or urgency, or incontinence  MUSCULOSKELETAL: No unrelieved bone/joint pain NEUROLOGIC: No headache, dizziness  PSYCHIATRIC: No overt anxiety or sadness, No behavior issue. Daughter feels that patient is having some dementia type symptoms  progressive   Filed Vitals:   11/30/14 1854  BP: 106/55  Pulse: 78  Temp: 98.4 F (36.9 C)  Resp: 15    Physical Exam  GENERAL APPEARANCE: Alert, conversant,  No acute distress. Somewhat frail appearing  SKIN: No diaphoresis rash HEAD: Normocephalic, atraumatic  EYES: Conjunctiva/lids clear. Pupils round, reactive. EOMs intact.  EARS: External exam WNL, canals clear. Hearing grossly normal.  NOSE: No deformity or discharge.  MOUTH/THROAT: Oropharynx is clear  mucous membranes moist  RESPIRATORY: Breathing is unlabored-somewhat reduced breath sounds with slight expiratory congestion   CARDIOVASCULAR: Heart RRR no murmurs, rubs or gallops. No peripheral edema.   GASTROINTESTINAL: Abdomen is soft, does not appear to be acutely tender some mild tenderness to palpation although this may be due more to the invasive maneuver bowel sounds are active GENITOURINARY: Bladder non tender, not distended  MUSCULOSKELETAL: R arm in sling she has a positive radial pulse grip strength appears to be intact NEUROLOGIC:  Cranial nerves 2-12 grossly intact. Moves all extremities -no lateralizing findings PSYCHIATRIC: She is oriented to self month year in date-she did have difficulty naming the President of the [place]-and also her nurse tech who she is actually quite familiar with she was able to eventually name her however  Patient Active Problem List   Diagnosis Date Noted  . Stomach discomfort 11/30/2014  . Subacute confusional state 11/30/2014  . Acute respiratory failure with hypoxia 11/11/2014  . AKI (acute kidney injury) 11/11/2014  . Hypernatremia 11/11/2014  . Hypokalemia 11/09/2014  . COPD exacerbation   . Acute on chronic diastolic heart failure   . Hypoxia   . SOB (shortness of breath)   . Pressure ulcer stage II 11/02/2014  . Palliative care encounter 11/02/2014  . HCAP (healthcare-associated pneumonia) 11/01/2014  . Leukocytosis 10/15/2014  . Closed fracture of part of upper  end of humerus 10/14/2014  . Poor balance 11/25/2013  . History of stroke 11/25/2013  . 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 Myalgia 09/05/2012  . Disequilibrium 09/05/2012  . Hypertension, essential 09/05/2012  . 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 Mobility impaired 07/15/2012  . Physical exam, routine 12/07/2011  . Cerumen impaction 04/04/2011  . Trapezius muscle spasm 04/04/2011  . HTN (hypertension) 11/30/2010  . Hyperlipidemia 11/30/2010  . CVA (cerebral infarction) 11/30/2010  . Postmenopausal HRT (hormone replacement therapy) 11/30/2010  . Anxiety 11/30/2010  . Ankle weakness 11/30/2010    Labs.  Sodium 137 potassium 4 BUN 20 creatinine 0.5 CO2 31.  WBC 11.0 hemoglobin 9 platelets 373 absolute neutrophils 8.1.    CBC    Component Value Date/Time   WBC 11.4* 11/05/2014 0225   RBC 3.23* 11/05/2014 0225   HGB 8.8* 11/05/2014 0225   HCT 28.0* 11/05/2014 0225   PLT 391 11/05/2014 0225   MCV 86.7 11/05/2014 0225   LYMPHSABS 1.1 11/01/2014 1050   MONOABS 1.1* 11/01/2014 1050   EOSABS 0.0 11/01/2014 1050   BASOSABS 0.0 11/01/2014 1050    CMP     Component Value Date/Time   NA 145 11/05/2014 0225   K 3.0* 11/05/2014 0225   CL 110 11/05/2014 0225   CO2 28 11/05/2014 0225   GLUCOSE 96 11/05/2014 0225   BUN 19 11/05/2014 0225   CREATININE 0.67 11/05/2014 0225   CALCIUM 7.7* 11/05/2014 0225   PROT 4.8* 11/01/2014 1050   ALBUMIN 1.3* 11/01/2014 1050   AST 45* 11/01/2014 1050   ALT 24 11/01/2014 1050   ALKPHOS 108 11/01/2014 1050   BILITOT 0.6 11/01/2014 1050   GFRNONAA >60 11/05/2014 0225   GFRAA >60 11/05/2014 0225    Assessment and Plan  #1-history of abdominal fullness-she did have a bowel movement today-will check an abdominal x-ray also liver function tests-she does not appear to really be having pain here just a feeling of fullness there's been no nausea or vomiting .  #2-history of pneumonia with some slight chest congestion noted on exam  will recheck a chest x-ray two-view she has been afebrile  does not appear to be in any distress certainly.  #3-history of anemia appears hemoglobin at 9 is slightly down from 9.8 last month at one point had been 11.5 and appears early in Brett-will update a CBC.  #4-dementia? Will order a psychiatric consult- is pleasant and appropriate was grossly alert and oriented but did have troubles when asked for details such as names--again will obtain psychiatric nurse practitioner input-this was discussed with her daughter at bedside as well  #5-history of right humerus fracture again this being treated conservatively in a sling at this point appears to be stable she is followed by orthopedics.  [id number] note greater than 35 minutes spent assessing patient reviewing her chart-discussing her status with her daughter at bedside-iandcoordinating and formulating a plan of care for numerous diagnoses-of note greater than 50% of time spent coordinating plan of care  Krysteena Stalker C,

## 2014-12-08 ENCOUNTER — Encounter: Payer: Self-pay | Admitting: Internal Medicine

## 2014-12-08 ENCOUNTER — Non-Acute Institutional Stay (SKILLED_NURSING_FACILITY): Payer: Medicare Other | Admitting: Internal Medicine

## 2014-12-08 DIAGNOSIS — J069 Acute upper respiratory infection, unspecified: Secondary | ICD-10-CM

## 2014-12-08 DIAGNOSIS — D649 Anemia, unspecified: Secondary | ICD-10-CM

## 2014-12-10 ENCOUNTER — Non-Acute Institutional Stay (SKILLED_NURSING_FACILITY): Payer: Medicare Other | Admitting: Internal Medicine

## 2014-12-10 ENCOUNTER — Encounter: Payer: Self-pay | Admitting: Internal Medicine

## 2014-12-10 DIAGNOSIS — D509 Iron deficiency anemia, unspecified: Secondary | ICD-10-CM | POA: Diagnosis not present

## 2014-12-10 DIAGNOSIS — D638 Anemia in other chronic diseases classified elsewhere: Secondary | ICD-10-CM

## 2014-12-10 HISTORY — DX: Iron deficiency anemia, unspecified: D50.9

## 2014-12-10 NOTE — Progress Notes (Signed)
Patient ID: Beverly Black, female   DOB: 02/21/1929, 81 y.o.   MRN: [id number]  MRN: [id number] Name: Beverly Black  Sex: female Age: 81 y.o. DOB: [birth year]  [facility]: [name] Facility/Room:515 Level Of Care: SNF Provider: Roena Malady Emergency Contacts: Extended Emergency Contact Information Primary Emergency Contact: Beverly Black Address: [address]          [name] [address] [place] Home Phone: [phone] Relation: Daughter Secondary Emergency Contact: Beverly Black [place] Home Phone: [phone] Relation: Daughter  Code Status:   Allergies: Sulfa antibiotics  Chief Complaint  Patient presents with  . Acute Visit   follow-up anemia  HPI: Patient is 81 y.o. female who in the past month has broken her arm, sent home, and  been hospitalized for PNA and respiratory failure. Pt was admitted to SNF with generalized weakness and inability to be cared for at home even with family  There was recently noted her hemoglobin appeared to be dropping appears it ran in the 8-9 range in the hospital at one point had been up 2011-on recent lab but had dropped to 7.9-updated lab however shows it has improved to 8.3.  Clinically she appears to be at baseline frail but does not appear to be precipitously declining.  Lab work also was done for iron studies which showed iron is 21 total iron-binding capacity is 160 B12 was within normal range as well as folate ferritin is 205.2.  I testing for occult bleeding also has been ordered however apparently no results yet-I did attempt to do this when I examined her earlier this week it was not enough stool for testing there was no active rectal bleeding visibly when the exam was done.  Currently vital signs are stable she does not have any complaints  Past Medical History  Diagnosis Date  . Hypertension   . Hyperlipidemia   . Stroke 10/2008  . Aneurysm     of the eye  . Osteoporosis      Past Surgical History  Procedure Laterality Date  . Eye surgery    . Partial hysterectomy  1968      Medication List       This list is accurate as of: 12/10/14 11:59 PM.  Always use your most recent med list.               amLODipine 10 MG tablet  Commonly known as:  NORVASC  TAKE 1 TABLET AT BEDTIME     aspirin 81 MG tablet  Take 81 mg by mouth daily.     atenolol 50 MG tablet  Commonly known as:  TENORMIN  TAKE 1 TABLET TWICE A DAY     atorvastatin 40 MG tablet  Commonly known as:  LIPITOR  TAKE 1 TABLET DAILY     BIOFREEZE 4 % Gel  Generic drug:  Menthol (Topical Analgesic)  Apply 1 application topically 2 (two) times daily.     budesonide 0.25 MG/2ML nebulizer solution  Commonly known as:  PULMICORT  Take 2 mLs (0.25 mg total) by nebulization 2 (two) times daily.     cholecalciferol 1000 UNITS tablet  Commonly known as:  VITAMIN D  Take 1,000 Units by mouth daily.     docusate sodium 100 MG capsule  Commonly known as:  COLACE  Take 100 mg by mouth 2 (two) times daily.     ENSURE  Take 237 mLs by mouth daily. At 2 pm  feeding supplement (ENSURE ENLIVE) Liqd  Take 237 mLs by mouth 3 (three) times daily between meals.     escitalopram 10 MG tablet  Commonly known as:  LEXAPRO  Take 10 mg by mouth daily.     fexofenadine 180 MG tablet  Commonly known as:  ALLEGRA  Take 180 mg by mouth daily.     ipratropium-albuterol 0.5-2.5 (3) MG/3ML Soln  Commonly known as:  DUONEB  Take 3 mLs by nebulization every 4 (four) hours as needed (SOB/wheezing).     LORazepam 0.5 MG tablet  Commonly known as:  ATIVAN  Take 1 tablet (0.5 mg total) by mouth every 8 (eight) hours.     mirtazapine 30 MG tablet  Commonly known as:  REMERON  TAKE 1 TABLET AT BEDTIME     MULTIVITAMIN ADULT PO  Take 1 tablet by mouth daily.     oxyCODONE 5 MG immediate release tablet  Commonly known as:  Oxy IR/ROXICODONE  Take 1 tablet (5 mg total) by mouth every 6 (six)  hours as needed for severe pain.     pantoprazole 40 MG tablet  Commonly known as:  PROTONIX  Take 1 tablet (40 mg total) by mouth 2 (two) times daily.     polyethylene glycol packet  Commonly known as:  MIRALAX / GLYCOLAX  Take 17 g by mouth daily. To be hold for diarrhea     Potassium Chloride ER 20 MEQ Tbcr  Take 1 tablet by mouth daily.     PREMARIN 0.3 MG tablet  Generic drug:  estrogens (conjugated)  TAKE 1 TABLET DAILY FOR 21 DAYS, THEN DO NOT TAKE FOR 7 DAYS     PRESCRIPTION MEDICATION  Pt to take 1 two times a day to promote weight gain. MAGIC CUP     traMADol 50 MG tablet  Commonly known as:  ULTRAM  Take 1 tablet (50 mg total) by mouth every 6 (six) hours as needed for moderate pain.     ZETIA 10 MG tablet  Generic drug:  ezetimibe  TAKE 1 TABLET DAILY        No orders of the defined types were placed in this encounter.    Immunization History  Administered Date(s) Administered  . Influenza,inj,Quad PF,36+ Mos 04/16/2013, 05/06/2014    Social History  Substance Use Topics  . Smoking status: Current Every Day Smoker -- 0.50 packs/day for 50 years  . Smokeless tobacco: Not on file  . Alcohol Use: No    Family history is noncontributory    Review of Systems  DATA OBTAINED: from patient, nurse,  GENERAL:  no fevers,  I  SKIN: No itching, rash EYES: No eye pain, redness, discharge EARS: No earache, tinnitus, change in hearing NOSE: No congestion, drainage or bleeding  MOUTH/THROAT: No mouth or tooth pain, No sore throat RESPIRATORY: Does not really complain of cough or shortness of breath CARDIAC: No chest pain, palpitations, lower extremity edema  GI: No abdominal pain, but has a feeling of abdominal fullness and constipation-did have a bowel movement today  GU: No dysuria, frequency or urgency, or incontinence  MUSCULOSKELETAL: No unrelieved bone/joint pain NEUROLOGIC: No headache, dizziness  PSYCHIATRIC: No overt anxiety or sadness, No behavior  issue. Daughter feels that patient is having some dementia type symptoms progressive--she is to be seen by psychiatric nurse practitioner   Filed Vitals:   12/10/14 1010  BP: 121/67  Pulse: 60  Temp: 98.2 F (36.8 C)  Resp: 18    Physical Exam  GENERAL APPEARANCE: Alert, conversant,  No acute distress. Somewhat frail appearing  SKIN: No diaphoresis rash HEAD: Normocephalic, atraumatic  EYES: Conjunctiva/lids clear. Pupils round, reactive. EOMs intact.  EARS: External exam WNL, canals clear. Hearing grossly normal.  NOSE: No deformity or discharge.  MOUTH/THROAT: Oropharynx is clear mucous membranes moist  RESPIRATORY: Breathing is unlabored-somewhat reduced breath sounds but could not really appreciate any congestion today CARDIOVASCULAR: Heart RRR no murmurs, rubs or gallops. No peripheral edema.   GASTROINTESTINAL: Abdomen is soft, does not appear to be acutely tender some mild tenderness to palpation although this may be due more to the invasive maneuver bowel sounds are active GENITOURINARY: Bladder non tender, not distended  MUSCULOSKELETAL: History right arm humerus fracture- she has a positive radial pulse grip strength appears to be intact NEUROLOGIC:  Cranial nerves 2-12 grossly intact. Moves all extremities -no lateralizing findings PSYCHIATRIC: Pleasant and appropriate although has periods of confusion  Patient Active Problem List   Diagnosis Date Noted  . Anemia 01/05/2015  . Acute upper respiratory infection 01/05/2015  . Dementia with behavioral disturbance 12/31/2014  . Iron deficiency anemia 12/10/2014  . Stomach discomfort 11/30/2014  . Subacute confusional state 11/30/2014  . Acute respiratory failure with hypoxia 11/11/2014  . AKI (acute kidney injury) 11/11/2014  . Hypernatremia 11/11/2014  . Hypokalemia 11/09/2014  . COPD exacerbation   . Acute on chronic diastolic heart failure   . Hypoxia   . SOB (shortness of breath)   . Pressure ulcer stage II  11/02/2014  . Palliative care encounter 11/02/2014  . HCAP (healthcare-associated pneumonia) 11/01/2014  . Leukocytosis 10/15/2014  . Closed fracture of part of upper end of humerus 10/14/2014  . Poor balance 11/25/2013  . History of stroke 11/25/2013  . 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 Myalgia 09/05/2012  . Disequilibrium 09/05/2012  . Hypertension, essential 09/05/2012  . 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 Mobility impaired 07/15/2012  . Physical exam, routine 12/07/2011  . Cerumen impaction 04/04/2011  . Trapezius muscle spasm 04/04/2011  . HTN (hypertension) 11/30/2010  . Hyperlipidemia 11/30/2010  . CVA (cerebral infarction) 11/30/2010  . Postmenopausal HRT (hormone replacement therapy) 11/30/2010  . Anxiety 11/30/2010  . Ankle weakness 11/30/2010    Labs 12/09/2014 WBC 7.7 hemoglobin 8.3 platelets 394 hemoglobin previously was 7.9.  Ferritin 25.2-folate 7.2-iron 21-total iron-binding capacity 160-vitamin B12 384.   .    CBC    Component Value Date/Time   WBC 11.4* 11/05/2014 0225   RBC 3.23* 11/05/2014 0225   HGB 8.8* 11/05/2014 0225   HCT 28.0* 11/05/2014 0225   PLT 391 11/05/2014 0225   MCV 86.7 11/05/2014 0225   LYMPHSABS 1.1 11/01/2014 1050   MONOABS 1.1* 11/01/2014 1050   EOSABS 0.0 11/01/2014 1050   BASOSABS 0.0 11/01/2014 1050    CMP     Component Value Date/Time   NA 145 11/05/2014 0225   K 3.0* 11/05/2014 0225   CL 110 11/05/2014 0225   CO2 28 11/05/2014 0225   GLUCOSE 96 11/05/2014 0225   BUN 19 11/05/2014 0225   CREATININE 0.67 11/05/2014 0225   CALCIUM 7.7* 11/05/2014 0225   PROT 4.8* 11/01/2014 1050   ALBUMIN 1.3* 11/01/2014 1050   AST 45* 11/01/2014 1050   ALT 24 11/01/2014 1050   ALKPHOS 108 11/01/2014 1050   BILITOT 0.6 11/01/2014 1050   GFRNONAA >60 11/05/2014 0225   GFRAA >60 11/05/2014 0225    Assessment and Plan  Anemia-appears to have an element of chronic which would not be surprising as  well as possibly  iron deficiency-will add iron 325 mg daily-an update a CBC and basic metabolic panel first laboratory day next week-continue to test for occult blood-hemoglobin is trending up slightly I suspect there is a large element of chronicity to this--clinically she is frail but stable.  [id number]   Robertta Halfhill C,

## 2014-12-16 ENCOUNTER — Non-Acute Institutional Stay (SKILLED_NURSING_FACILITY): Payer: Medicare Other | Admitting: Internal Medicine

## 2014-12-16 ENCOUNTER — Encounter: Payer: Self-pay | Admitting: Internal Medicine

## 2014-12-16 DIAGNOSIS — L8992 Pressure ulcer of unspecified site, stage 2: Secondary | ICD-10-CM | POA: Diagnosis not present

## 2014-12-16 DIAGNOSIS — I10 Essential (primary) hypertension: Secondary | ICD-10-CM | POA: Diagnosis not present

## 2014-12-16 DIAGNOSIS — F419 Anxiety disorder, unspecified: Secondary | ICD-10-CM | POA: Diagnosis not present

## 2014-12-16 NOTE — Assessment & Plan Note (Addendum)
Well controlled;cont norvasc 10 mg daily, Lisinopril 20 mg daily and tenormin 50 mg BID

## 2014-12-16 NOTE — Assessment & Plan Note (Signed)
Pyt's anxiety appears well controlled on ativan and remeron.

## 2014-12-16 NOTE — Assessment & Plan Note (Addendum)
Wound care is ongoing and sacral ulcer is better but has new breakdown on hip because of positioning to get off sacrum. Wound care nurse admits pt seems uncomfortable to her esp in positioning. Pt is not asking for it-will schedule for Ultram 50 mg TID and titrate for effect.

## 2014-12-16 NOTE — Progress Notes (Signed)
MRN: [id number] Name: Beverly Black  Sex: female Age: 81 y.o. DOB: May 24, 1928  [facility]: [name] Facility/Room:107 Level Of Care: SNF Provider: Merrilee Seashore D Emergency Contacts: Extended Emergency Contact Information Primary Emergency Contact: [name],[name] Address: [address]          [name] [address] [place] Home Phone: [phone] Relation: Daughter Secondary Emergency Contact: [name] [place] Home Phone: [phone] Relation: Daughter  Code Status:   Allergies: Sulfa antibiotics  Chief Complaint  Patient presents with  . Acute Visit  . Medical Management of Chronic Issues    HPI: Patient is 81 y.o. female who is being seen for poorly controlled pain and for HTN and anxiety. Per family pt appears in pain when they visit. Per wound care nurse she agrees with family and though she has pain med ordered prn  she is not asking for it.   Past Medical History  Diagnosis Date  . Hypertension   . Hyperlipidemia   . Stroke 10/2008  . Aneurysm     of the eye  . Osteoporosis     Past Surgical History  Procedure Laterality Date  . Eye surgery    . Partial hysterectomy  1968      Medication List       This list is accurate as of: 12/16/14  4:08 PM.  Always use your most recent med list.               amLODipine 10 MG tablet  Commonly known as:  NORVASC  TAKE 1 TABLET AT BEDTIME     aspirin 81 MG tablet  Take 81 mg by mouth daily.     atenolol 50 MG tablet  Commonly known as:  TENORMIN  TAKE 1 TABLET TWICE A DAY     atorvastatin 40 MG tablet  Commonly known as:  LIPITOR  TAKE 1 TABLET DAILY     BIOFREEZE 4 % Gel  Generic drug:  Menthol (Topical Analgesic)  Apply 1 application topically 2 (two) times daily.     budesonide 0.25 MG/2ML nebulizer solution  Commonly known as:  PULMICORT  Take 2 mLs (0.25 mg total) by nebulization 2 (two) times daily.     cholecalciferol 1000 UNITS tablet   Commonly known as:  VITAMIN D  Take 1,000 Units by mouth daily.     docusate sodium 100 MG capsule  Commonly known as:  COLACE  Take 100 mg by mouth 2 (two) times daily.     ENSURE  Take 237 mLs by mouth daily. At 2 pm     feeding supplement (ENSURE ENLIVE) Liqd  Take 237 mLs by mouth 3 (three) times daily between meals.     escitalopram 10 MG tablet  Commonly known as:  LEXAPRO  Take 10 mg by mouth daily.     fexofenadine 180 MG tablet  Commonly known as:  ALLEGRA  Take 180 mg by mouth daily.     ipratropium-albuterol 0.5-2.5 (3) MG/3ML Soln  Commonly known as:  DUONEB  Take 3 mLs by nebulization every 4 (four) hours as needed (SOB/wheezing).     LORazepam 0.5 MG tablet  Commonly known as:  ATIVAN  Take 1 tablet (0.5 mg total) by mouth every 8 (eight) hours.     mirtazapine 30 MG tablet  Commonly known as:  REMERON  TAKE 1 TABLET AT BEDTIME     MULTIVITAMIN ADULT PO  Take 1 tablet by mouth daily.  oxyCODONE 5 MG immediate release tablet  Commonly known as:  Oxy IR/ROXICODONE  Take 1 tablet (5 mg total) by mouth every 6 (six) hours as needed for severe pain.     pantoprazole 40 MG tablet  Commonly known as:  PROTONIX  Take 1 tablet (40 mg total) by mouth 2 (two) times daily.     polyethylene glycol packet  Commonly known as:  MIRALAX / GLYCOLAX  Take 17 g by mouth daily. To be hold for diarrhea     Potassium Chloride ER 20 MEQ Tbcr  Take 1 tablet by mouth daily.     PREMARIN 0.3 MG tablet  Generic drug:  estrogens (conjugated)  TAKE 1 TABLET DAILY FOR 21 DAYS, THEN DO NOT TAKE FOR 7 DAYS     PRESCRIPTION MEDICATION  Pt to take 1 two times a day to promote weight gain. MAGIC CUP     traMADol 50 MG tablet  Commonly known as:  ULTRAM  Take 1 tablet (50 mg total) by mouth every 6 (six) hours as needed for moderate pain.     ZETIA 10 MG tablet  Generic drug:  ezetimibe  TAKE 1 TABLET DAILY        No orders of the defined types were placed in this  encounter.    Immunization History  Administered Date(s) Administered  . Influenza,inj,Quad PF,36+ Mos 04/16/2013, 05/06/2014    Social History  Substance Use Topics  . Smoking status: Current Every Day Smoker -- 0.50 packs/day for 50 years  . Smokeless tobacco: Not on file  . Alcohol Use: No    Review of Systems  DATA OBTAINED: from patient, nurse,  family member GENERAL:  no fevers, fatigue, appetite changes SKIN: No itching, rash HEENT: No complaint RESPIRATORY: No cough, wheezing, SOB CARDIAC: No chest pain, palpitations, lower extremity edema  GI: No abdominal pain, No N/V/D or constipation, No heartburn or reflux  GU: No dysuria, frequency or urgency, or incontinence  MUSCULOSKELETAL: pain with positioning NEUROLOGIC: No headache, dizziness  PSYCHIATRIC: No overt anxiety or sadness  Filed Vitals:   12/16/14 1547  BP: 110/64  Pulse: 70  Temp: 97.9 F (36.6 C)  Resp: 16    Physical Exam  GENERAL APPEARANCE: Alert, conversant,Thin WF No acute distress  SKIN: No diaphoresis rash; wounds are dressed  HEENT: Unremarkable RESPIRATORY: Breathing is even, unlabored. Lung sounds are clear   CARDIOVASCULAR: Heart RRR no murmurs, rubs or gallops. No peripheral edema  GASTROINTESTINAL: Abdomen is soft, non-tender, not distended w/ normal bowel sounds.  GENITOURINARY: Bladder non tender, not distended  MUSCULOSKELETAL: No abnormal joints or musculature NEUROLOGIC: Cranial nerves 2-12 grossly intact. Moves all extremities PSYCHIATRIC: Mood and affect appropriate to situation, no behavioral issues  Patient Active Problem List   Diagnosis Date Noted  . Iron deficiency anemia 12/10/2014  . Stomach discomfort 11/30/2014  . Subacute confusional state 11/30/2014  . Acute respiratory failure with hypoxia 11/11/2014  . AKI (acute kidney injury) 11/11/2014  . Hypernatremia 11/11/2014  . Hypokalemia 11/09/2014  . COPD exacerbation   . Acute on chronic diastolic heart failure    . Hypoxia   . SOB (shortness of breath)   . Pressure ulcer stage II 11/02/2014  . Palliative care encounter 11/02/2014  . HCAP (healthcare-associated pneumonia) 11/01/2014  . Leukocytosis 10/15/2014  . Closed fracture of part of upper end of humerus 10/14/2014  . Poor balance 11/25/2013  . History of stroke 11/25/2013  . 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 Myalgia 09/05/2012  . Disequilibrium  09/05/2012  . Hypertension, essential 09/05/2012  . 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 Mobility impaired 07/15/2012  . Physical exam, routine 12/07/2011  . Cerumen impaction 04/04/2011  . Trapezius muscle spasm 04/04/2011  . HTN (hypertension) 11/30/2010  . Hyperlipidemia 11/30/2010  . CVA (cerebral infarction) 11/30/2010  . Postmenopausal HRT (hormone replacement therapy) 11/30/2010  . Anxiety 11/30/2010  . Ankle weakness 11/30/2010    CBC    Component Value Date/Time   WBC 11.4* 11/05/2014 0225   RBC 3.23* 11/05/2014 0225   HGB 8.8* 11/05/2014 0225   HCT 28.0* 11/05/2014 0225   PLT 391 11/05/2014 0225   MCV 86.7 11/05/2014 0225   LYMPHSABS 1.1 11/01/2014 1050   MONOABS 1.1* 11/01/2014 1050   EOSABS 0.0 11/01/2014 1050   BASOSABS 0.0 11/01/2014 1050    CMP     Component Value Date/Time   NA 145 11/05/2014 0225   K 3.0* 11/05/2014 0225   CL 110 11/05/2014 0225   CO2 28 11/05/2014 0225   GLUCOSE 96 11/05/2014 0225   BUN 19 11/05/2014 0225   CREATININE 0.67 11/05/2014 0225   CALCIUM 7.7* 11/05/2014 0225   PROT 4.8* 11/01/2014 1050   ALBUMIN 1.3* 11/01/2014 1050   AST 45* 11/01/2014 1050   ALT 24 11/01/2014 1050   ALKPHOS 108 11/01/2014 1050   BILITOT 0.6 11/01/2014 1050   GFRNONAA >60 11/05/2014 0225   GFRAA >60 11/05/2014 0225    Assessment and Plan  Pressure ulcer stage II Wound care is ongoing and sacral ulcer is better but has new breakdown on hip because of positioning to get off sacrum. Wound care nurse admits pt seems uncomfortable to her esp in  positioning. Pt is not asking for it-will schedule for 50 mg TID.  Hypertension, essential Well controlled;cont norvasc 10 mg daily, Lisinopril 20 mg daily and tenormin 50 mg BID  Anxiety Pyt's anxiety appears well controlled on ativan and remeron.    [name], MD

## 2014-12-17 ENCOUNTER — Encounter: Payer: Self-pay | Admitting: Internal Medicine

## 2014-12-17 ENCOUNTER — Non-Acute Institutional Stay (SKILLED_NURSING_FACILITY): Payer: Medicare Other | Admitting: Internal Medicine

## 2014-12-17 DIAGNOSIS — Z8701 Personal history of pneumonia (recurrent): Secondary | ICD-10-CM

## 2014-12-17 DIAGNOSIS — D509 Iron deficiency anemia, unspecified: Secondary | ICD-10-CM | POA: Diagnosis not present

## 2014-12-17 DIAGNOSIS — R634 Abnormal weight loss: Secondary | ICD-10-CM

## 2014-12-17 NOTE — Progress Notes (Signed)
Patient ID: Smt. N Rayborn, female   DOB: Apr 02, 1929, 81 y.o.   MRN: [id number] MRN: [id number] Name: Beverly Black  Sex: female Age: 81 y.o. DOB: 1928/08/12  [facility]: [name] Facility/Room:107 Level Of Care: SNF Provider: Roena Malady Emergency Contacts: Extended Emergency Contact Information Primary Emergency Contact: [name],[name] Address: [address]          [name] [address] [place] Home Phone: [phone] Relation: Daughter Secondary Emergency Contact: [name] [place] Home Phone: [phone] Relation: Daughter  Code Status:   Allergies: Sulfa antibiotics  Chief Complaint  Patient presents with  . Acute Visit   Acute visit follow-up anemia-  HPI: Patient is 81 y.o. female who is being seen for follow-up of anemia.  Was noted that she had a drop in hemoglobin at one point she had been 10.6 and dropped to 9 and down to 7.9 did rebound on most recent lab up to 8.2-she has been started on iron.  She has an iron studies done which appeared to show an element of chronic disease with a TIBC of 160 and low serum iron of 21 There's been no occult bleeding noted.  Patient also was seen yesterday for pain management with a history of a sacral ulcer of the sacral ulcer appears to be improving per Dr. Mardelle Matte assessment-however she is not asking for when necessary pain medications and Dr. [name] has made her tramadol routine 3 times a day this appears to be helping from what I see today.  She also has a history of pneumonia in the hospital this was found to be persisting here and she received another course antibiotic-this appears to be stable there's been no reported increased cough her lungs sound clear today which is encouraging  .   [name]   Past Medical History  Diagnosis Date  . Hypertension   . Hyperlipidemia   . Stroke 10/2008  . Aneurysm     of the eye  . Osteoporosis     Past Surgical History  Procedure  Laterality Date  . Eye surgery    . Partial hysterectomy  1968      Medication List       This list is accurate as of: 12/17/14 11:59 PM.  Always use your most recent med list.               amLODipine 10 MG tablet  Commonly known as:  NORVASC  TAKE 1 TABLET AT BEDTIME     aspirin 81 MG tablet  Take 81 mg by mouth daily.     atenolol 50 MG tablet  Commonly known as:  TENORMIN  TAKE 1 TABLET TWICE A DAY     atorvastatin 40 MG tablet  Commonly known as:  LIPITOR  TAKE 1 TABLET DAILY     BIOFREEZE 4 % Gel  Generic drug:  Menthol (Topical Analgesic)  Apply 1 application topically 2 (two) times daily.     budesonide 0.25 MG/2ML nebulizer solution  Commonly known as:  PULMICORT  Take 2 mLs (0.25 mg total) by nebulization 2 (two) times daily.     cholecalciferol 1000 UNITS tablet  Commonly known as:  VITAMIN D  Take 1,000 Units by mouth daily.     docusate sodium 100 MG capsule  Commonly known as:  COLACE  Take 100 mg by mouth 2 (two) times daily.     ENSURE  Take 237 mLs by mouth daily. At 2 pm  feeding supplement (ENSURE ENLIVE) Liqd  Take 237 mLs by mouth 3 (three) times daily between meals.     escitalopram 10 MG tablet  Commonly known as:  LEXAPRO  Take 10 mg by mouth daily.     fexofenadine 180 MG tablet  Commonly known as:  ALLEGRA  Take 180 mg by mouth daily.     ipratropium-albuterol 0.5-2.5 (3) MG/3ML Soln  Commonly known as:  DUONEB  Take 3 mLs by nebulization every 4 (four) hours as needed (SOB/wheezing).     LORazepam 0.5 MG tablet  Commonly known as:  ATIVAN  Take 1 tablet (0.5 mg total) by mouth every 8 (eight) hours.     mirtazapine 30 MG tablet  Commonly known as:  REMERON  TAKE 1 TABLET AT BEDTIME     MULTIVITAMIN ADULT PO  Take 1 tablet by mouth daily.     oxyCODONE 5 MG immediate release tablet  Commonly known as:  Oxy IR/ROXICODONE  Take 1 tablet (5 mg total) by mouth every 6 (six) hours as needed for severe pain.      pantoprazole 40 MG tablet  Commonly known as:  PROTONIX  Take 1 tablet (40 mg total) by mouth 2 (two) times daily.     polyethylene glycol packet  Commonly known as:  MIRALAX / GLYCOLAX  Take 17 g by mouth daily. To be hold for diarrhea     Potassium Chloride ER 20 MEQ Tbcr  Take 1 tablet by mouth daily.     PREMARIN 0.3 MG tablet  Generic drug:  estrogens (conjugated)  TAKE 1 TABLET DAILY FOR 21 DAYS, THEN DO NOT TAKE FOR 7 DAYS     PRESCRIPTION MEDICATION  Pt to take 1 two times a day to promote weight gain. MAGIC CUP     traMADol 50 MG tablet  Commonly known as:  ULTRAM  Take 1 tablet (50 mg total) by mouth every 6 (six) hours as needed for moderate pain.     ZETIA 10 MG tablet  Generic drug:  ezetimibe  TAKE 1 TABLET DAILY          Immunization History  Administered Date(s) Administered  . Influenza,inj,Quad PF,36+ Mos 04/16/2013, 05/06/2014    Social History  Substance Use Topics  . Smoking status: Current Every Day Smoker -- 0.50 packs/day for 50 years  . Smokeless tobacco: Not on file  . Alcohol Use: No    Review of Systems  DATA OBTAINED: from patient, nurse,   GENERAL:  no fevers, fatigue, appetite changes SKIN: No itching, rash HEENT: No complaint RESPIRATORY: No cough, wheezing, SOB CARDIAC: No chest pain, palpitations, lower extremity edema  GI: No abdominal pain, No N/V/D or constipation, No heartburn or reflux  GU: No dysuria, frequency or urgency, or incontinence  MUSCULOSKELETAL: pain with positioning-she is now on tramadol and should be receiving this routinely NEUROLOGIC: No headache, dizziness  PSYCHIATRIC: No overt anxiety or sadness  Filed Vitals:   12/17/14 2045  BP: 107/62  Pulse: 70  Temp: 97.5 F (36.4 C)  Resp: 18    Physical Exam  GENERAL APPEARANCE: Alert, conversant,Thin WF No acute distress  SKIN: No diaphoresis rash; wounds are dressed  HEENT: Unremarkable oropharynx is clear mucous membranes appear fairly  moist RESPIRATORY: Breathing is even, unlabored. Lung sounds are clear   CARDIOVASCULAR: Heart RRR no murmurs, rubs or gallops. No peripheral edema  GASTROINTESTINAL: Abdomen is soft, non-tender, not distended w/ normal bowel sounds.  GENITOURINARY: Bladder non tender, not distended  MUSCULOSKELETAL:  No abnormal joints or musculature does appear to have some muscle wasting NEUROLOGIC: Cranial nerves 2-12 grossly intact. Moves all extremities PSYCHIATRIC: Mood and affect appropriate to situation, no behavioral issues  Patient Active Problem List   Diagnosis Date Noted  . Anemia 01/05/2015  . Acute upper respiratory infection 01/05/2015  . Dementia with behavioral disturbance 12/31/2014  . Iron deficiency anemia 12/10/2014  . Stomach discomfort 11/30/2014  . Subacute confusional state 11/30/2014  . Acute respiratory failure with hypoxia 11/11/2014  . AKI (acute kidney injury) 11/11/2014  . Hypernatremia 11/11/2014  . Hypokalemia 11/09/2014  . COPD exacerbation   . Acute on chronic diastolic heart failure   . Hypoxia   . SOB (shortness of breath)   . Pressure ulcer stage II 11/02/2014  . Palliative care encounter 11/02/2014  . HCAP (healthcare-associated pneumonia) 11/01/2014  . Leukocytosis 10/15/2014  . Closed fracture of part of upper end of humerus 10/14/2014  . Poor balance 11/25/2013  . History of stroke 11/25/2013  . 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 Myalgia 09/05/2012  . Disequilibrium 09/05/2012  . Hypertension, essential 09/05/2012  . 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 Mobility impaired 07/15/2012  . Physical exam, routine 12/07/2011  . Cerumen impaction 04/04/2011  . Trapezius muscle spasm 04/04/2011  . HTN (hypertension) 11/30/2010  . Hyperlipidemia 11/30/2010  . CVA (cerebral infarction) 11/30/2010  . Postmenopausal HRT (hormone replacement therapy) 11/30/2010  . Anxiety 11/30/2010  . Ankle weakness 11/30/2010   labs.  As noted in history of  present illness.  12/14/2014.  WBC 7.6 hemoglobin 8.2 platelets 335. MCV within normal limits at 83.8  Sodium 141 potassium 4.1 BUN 20 creatinine 0.5.  12/01/2014 hemoglobin was 7.9.  11/10/2014 hemoglobin was 9.  10/16/2014 hemoglobin was 10.6.  This is been a normocytic anemia  CBC    Component Value Date/Time   WBC 11.4* 11/05/2014 0225   RBC 3.23* 11/05/2014 0225   HGB 8.8* 11/05/2014 0225   HCT 28.0* 11/05/2014 0225   PLT 391 11/05/2014 0225   MCV 86.7 11/05/2014 0225   LYMPHSABS 1.1 11/01/2014 1050   MONOABS 1.1* 11/01/2014 1050   EOSABS 0.0 11/01/2014 1050   BASOSABS 0.0 11/01/2014 1050    CMP     Component Value Date/Time   NA 145 11/05/2014 0225   K 3.0* 11/05/2014 0225   CL 110 11/05/2014 0225   CO2 28 11/05/2014 0225   GLUCOSE 96 11/05/2014 0225   BUN 19 11/05/2014 0225   CREATININE 0.67 11/05/2014 0225   CALCIUM 7.7* 11/05/2014 0225   PROT 4.8* 11/01/2014 1050   ALBUMIN 1.3* 11/01/2014 1050   AST 45* 11/01/2014 1050   ALT 24 11/01/2014 1050   ALKPHOS 108 11/01/2014 1050   BILITOT 0.6 11/01/2014 1050   GFRNONAA >60 11/05/2014 0225   GFRAA >60 11/05/2014 0225    Assessment and Plan  Anemia-this appears to be chronic disease with possible element of iron deficiency--she has been started on iron hemoglobin appears to be trending up a bit but this will have to be watched will update a CBC on August 15.  #2 history of weight loss she is on aggressive supplementation including med Pass twice a day and ensure twice a day-will check an albumin level as well on August 15 as well as her electrolytes these have been actually stable here recently.  #3 history of pneumonia-this appears to have stabilized status post antibiotic therapy in the hospital and also in facility  #4-pain management she is now on tramadol this appears to  be helping she appeared be fairly comfortable on assessment today she did not complain of joint pain.  [id number]    Kyandre Okray,  Demesha Boorman C,

## 2014-12-25 ENCOUNTER — Telehealth: Payer: Self-pay | Admitting: Behavioral Health

## 2014-12-25 ENCOUNTER — Telehealth: Payer: Self-pay | Admitting: Family Medicine

## 2014-12-25 NOTE — Telephone Encounter (Signed)
pre visit letter mailed 12/07/14 °

## 2014-12-25 NOTE — Telephone Encounter (Signed)
Unable to reach patient at time of Pre-Visit Call. Could not leave a message; voice recording asked for an access code. Attempted mobile number as well, but is no longer a working number.

## 2014-12-28 ENCOUNTER — Encounter: Payer: Medicare Other | Admitting: Family Medicine

## 2014-12-31 ENCOUNTER — Non-Acute Institutional Stay (SKILLED_NURSING_FACILITY): Payer: Medicare Other | Admitting: Internal Medicine

## 2014-12-31 DIAGNOSIS — F03918 Unspecified dementia, unspecified severity, with other behavioral disturbance: Secondary | ICD-10-CM

## 2014-12-31 DIAGNOSIS — E876 Hypokalemia: Secondary | ICD-10-CM | POA: Diagnosis not present

## 2014-12-31 DIAGNOSIS — F0280 Dementia in other diseases classified elsewhere without behavioral disturbance: Secondary | ICD-10-CM

## 2014-12-31 DIAGNOSIS — D509 Iron deficiency anemia, unspecified: Secondary | ICD-10-CM | POA: Diagnosis not present

## 2014-12-31 DIAGNOSIS — F0391 Unspecified dementia with behavioral disturbance: Secondary | ICD-10-CM | POA: Diagnosis not present

## 2014-12-31 DIAGNOSIS — G20 Parkinson's disease: Secondary | ICD-10-CM

## 2014-12-31 HISTORY — DX: Dementia in other diseases classified elsewhere, unspecified severity, without behavioral disturbance, psychotic disturbance, mood disturbance, and anxiety: F02.80

## 2014-12-31 NOTE — Progress Notes (Signed)
Patient ID: Beverly Black, female   DOB: March 28, 1929, 81 y.o.   MRN: [id number] MRN: [id number] Name: Beverly Black  Sex: female Age: 81 y.o. DOB: 16-Sep-1928  [facility] Facility/Room:107 Level Of Care: SNF Provider: Roena Malady Emergency Contacts: Extended Emergency Contact Information Primary Emergency Contact: [name],[name] Address: [address]          [name] [address] [place] Home Phone: [phone] Relation: Daughter Secondary Emergency Contact: [name] [place] Home Phone: [phone] Relation: Daughter  Code Status:   Allergies: Sulfa antibiotics  Chief Complaint  Patient presents with  . Acute Visit   secondary to dementia with behaviors-anxiety-  HPI: Patient is 81 y.o. female a complicated history including a right humerus fracture-history of pneumonia and respiratory failure-general weakness.  Patient is followed by orthopedics for her fracture apparently this is stable.  Main issue recently has been increased agitation and anxiety-with suspicions for progressing dementia.  She has been seen recently by the psychiatric nurse practitioner and last week was started on Namenda XR7 milligrams that should be titrated up to 14 mg today.  Nursing staff notes especially morning patient has episodes of yelling and agitation-there is some thought this may be pain related Dr. [name] did make her tramadol routine 3 times a day.  She also has orders for OxyIR 5 mg every 6 hours when necessary although apparently she does not take this often does not ask for.  Of note she also is on Ativan 0.5 mg every 8 hours routinely .   Past Medical History  Diagnosis Date  . Hypertension   . Hyperlipidemia   . Stroke 10/2008  . Aneurysm     of the eye  . Osteoporosis     Past Surgical History  Procedure Laterality Date  . Eye surgery    . Partial hysterectomy  1968      Medication List       This list is  accurate as of: 12/31/14  2:18 PM.  Always use your most recent med list.               amLODipine 10 MG tablet  Commonly known as:  NORVASC  TAKE 1 TABLET AT BEDTIME     aspirin 81 MG tablet  Take 81 mg by mouth daily.     atenolol 50 MG tablet  Commonly known as:  TENORMIN  TAKE 1 TABLET TWICE A DAY     atorvastatin 40 MG tablet  Commonly known as:  LIPITOR  TAKE 1 TABLET DAILY     BIOFREEZE 4 % Gel  Generic drug:  Menthol (Topical Analgesic)  Apply 1 application topically 2 (two) times daily.     budesonide 0.25 MG/2ML nebulizer solution  Commonly known as:  PULMICORT  Take 2 mLs (0.25 mg total) by nebulization 2 (two) times daily.     cholecalciferol 1000 UNITS tablet  Commonly known as:  VITAMIN D  Take 1,000 Units by mouth daily.     docusate sodium 100 MG capsule  Commonly known as:  COLACE  Take 100 mg by mouth 2 (two) times daily.     ENSURE  Take 237 mLs by mouth daily. At 2 pm     feeding supplement (ENSURE ENLIVE) Liqd  Take 237 mLs by mouth 3 (three) times daily between meals.     escitalopram 10 MG tablet  Commonly known as:  LEXAPRO  Take 10 mg by mouth daily.  fexofenadine 180 MG tablet  Commonly known as:  ALLEGRA  Take 180 mg by mouth daily.     ipratropium-albuterol 0.5-2.5 (3) MG/3ML Soln  Commonly known as:  DUONEB  Take 3 mLs by nebulization every 4 (four) hours as needed (SOB/wheezing).     LORazepam 0.5 MG tablet  Commonly known as:  ATIVAN  Take 1 tablet (0.5 mg total) by mouth every 8 (eight) hours.     mirtazapine 30 MG tablet  Commonly known as:  REMERON  TAKE 1 TABLET AT BEDTIME     MULTIVITAMIN ADULT PO  Take 1 tablet by mouth daily.     NAMENDA XR 14 MG Cp24 24 hr capsule  Generic drug:  memantine  Take 14 mg by mouth daily.     oxyCODONE 5 MG immediate release tablet  Commonly known as:  Oxy IR/ROXICODONE  Take 1 tablet (5 mg total) by mouth every 6 (six) hours as needed for severe pain.     pantoprazole 40 MG  tablet  Commonly known as:  PROTONIX  Take 1 tablet (40 mg total) by mouth 2 (two) times daily.     polyethylene glycol packet  Commonly known as:  MIRALAX / GLYCOLAX  Take 17 g by mouth daily. To be hold for diarrhea     Potassium Chloride ER 20 MEQ Tbcr  Take 1 tablet by mouth daily.     PREMARIN 0.3 MG tablet  Generic drug:  estrogens (conjugated)  TAKE 1 TABLET DAILY FOR 21 DAYS, THEN DO NOT TAKE FOR 7 DAYS     PRESCRIPTION MEDICATION  Pt to take 1 two times a day to promote weight gain. MAGIC CUP     traMADol 50 MG tablet  Commonly known as:  ULTRAM  Take 1 tablet (50 mg total) by mouth every 6 (six) hours as needed for moderate pain.     ZETIA 10 MG tablet  Generic drug:  ezetimibe  TAKE 1 TABLET DAILY        Meds ordered this encounter  Medications  . memantine (NAMENDA XR) 14 MG CP24 24 hr capsule    Sig: Take 14 mg by mouth daily.    Immunization History  Administered Date(s) Administered  . Influenza,inj,Quad PF,36+ Mos 04/16/2013, 05/06/2014    Social History  Substance Use Topics  . Smoking status: Current Every Day Smoker -- 0.50 packs/day for 50 years  . Smokeless tobacco: Not on file  . Alcohol Use: No    Review of Systems  DATA OBTAINED: from patient, nurse,  GENERAL:  no fevers, fatigue, appetite changes SKIN: No itching, rash HEENT: No complaint RESPIRATORY: No cough, wheezing, SOB CARDIAC: No chest pain, palpitations, lower extremity edema  GI: No abdominal pain, No N/V/D or constipation, No heartburn or reflux  GU: No dysuria, frequency or urgency, or incontinence  MUSCULOSKELETAL: Pain appears to be more in the morning and and more in her right arm shoulder area-again with a history of humerus fracture NEUROLOGIC: No headache, dizziness  PSYCHIATRIC:has anxiety-evidence of dementia    Physical Exam Temperature 97.0 pulse 78 respirations 18 blood pressure 108/53  GENERAL APPEARANCE: Alert, conversant,Thin WF No acute distress  sitting comfortably in her wheelchair this afternoon SKIN: No diaphoresis rash; wounds are dressed  HEENT: Unremarkable this membranes appear fairly moist RESPIRATORY: Breathing is even, unlabored. Lung sounds are clear   CARDIOVASCULAR: Heart RRR no murmurs, rubs or gallops. No peripheral edema  GASTROINTESTINAL: Abdomen is soft, non-tender, somewhat protuberant which is baseline w/ normal bowel  sounds.   MUSCULOSKELETAL: No abnormal joints or musculature--again does have the right humerus fracture with limited mobility of her right arm and shoulder which is not Did not really note any deformities erythema or edema NEUROLOGIC: Cranial nerves 2-12 grossly intact. Moves all extremities PSYCHIATRIC: Mood and affect appropriate to situation, she is pleasant although somewhat confused does answer questions but does not speak a whole lot-again did tell me that she feels she has some arm pain more so in the morning on the right-and this appears to be what nursing sees more in the morning as well  Patient Active Problem List   Diagnosis Date Noted  . Dementia with behavioral disturbance 12/31/2014  . Iron deficiency anemia 12/10/2014  . Stomach discomfort 11/30/2014  . Subacute confusional state 11/30/2014  . Acute respiratory failure with hypoxia 11/11/2014  . AKI (acute kidney injury) 11/11/2014  . Hypernatremia 11/11/2014  . Hypokalemia 11/09/2014  . COPD exacerbation   . Acute on chronic diastolic heart failure   . Hypoxia   . SOB (shortness of breath)   . Pressure ulcer stage II 11/02/2014  . Palliative care encounter 11/02/2014  . HCAP (healthcare-associated pneumonia) 11/01/2014  . Leukocytosis 10/15/2014  . Closed fracture of part of upper end of humerus 10/14/2014  . Poor balance 11/25/2013  . History of stroke 11/25/2013  . 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 Myalgia 09/05/2012  . Disequilibrium 09/05/2012  . Hypertension, essential 09/05/2012  . Osteoporosis, post-menopausal  07/16/2012  . Mobility impaired 07/15/2012  . Physical exam, routine 12/07/2011  . Cerumen impaction 04/04/2011  . Trapezius muscle spasm 04/04/2011  . HTN (hypertension) 11/30/2010  . Hyperlipidemia 11/30/2010  . CVA (cerebral infarction) 11/30/2010  . Postmenopausal HRT (hormone replacement therapy) 11/30/2010  . Anxiety 11/30/2010  . Ankle weakness 11/30/2010     12/14/2014.  Sodium 141 potassium 4.1 BUN 20 creatinine 0.5.  Hemoglobin 8.2 WBC 7.6 platelets 335.  I do note hemoglobin 7.9 on July 26 12/09/2014.  Sodium 139 potassium 3.9 BUN 22 creatinine 0.6.  WBC 7.7 hemoglobin 8.3 platelets 394.  Ferritin 205-folate 7.2-iron 21-TIBC 160.  Vitamin B12 384.    CBC    Component Value Date/Time   WBC 11.4* 11/05/2014 0225   RBC 3.23* 11/05/2014 0225   HGB 8.8* 11/05/2014 0225   HCT 28.0* 11/05/2014 0225   PLT 391 11/05/2014 0225   MCV 86.7 11/05/2014 0225   LYMPHSABS 1.1 11/01/2014 1050   MONOABS 1.1* 11/01/2014 1050   EOSABS 0.0 11/01/2014 1050   BASOSABS 0.0 11/01/2014 1050    CMP     Component Value Date/Time   NA 145 11/05/2014 0225   K 3.0* 11/05/2014 0225   CL 110 11/05/2014 0225   CO2 28 11/05/2014 0225   GLUCOSE 96 11/05/2014 0225   BUN 19 11/05/2014 0225   CREATININE 0.67 11/05/2014 0225   CALCIUM 7.7* 11/05/2014 0225   PROT 4.8* 11/01/2014 1050   ALBUMIN 1.3* 11/01/2014 1050   AST 45* 11/01/2014 1050   ALT 24 11/01/2014 1050   ALKPHOS 108 11/01/2014 1050   BILITOT 0.6 11/01/2014 1050   GFRNONAA >60 11/05/2014 0225   GFRAA >60 11/05/2014 0225    Assessment and Plan 1 dementia with anxiety-question pain in the morning-she has just been started on Namenda X are this was titrated up I believe as of today-I discussed this with nursing there may be a pain etiology she is on tramadol 50 mg 3 times a day have spoken with nursing about being aggressive in using  the when necessary OxyIR as needed to see if this will give her some relief-also feel  it would benefit at some point to follow up with psychiatric nurse practitioner to see if there should be a change in her anxiety medication she is currently on Ativan 3 times a day. I note she also is on Lexapro-also on Remeron this is for appetite stimulation  #2 anemia which appears to have an element of chronic disease possibly iron deficiency here-will update a CBC hemoglobin appears to have stabilized.--In the low eights had been as low as 7.9 previously She has been started on iron.    #3 some history of hypokalemia she is on potassium supplementation Will update this as well.  [id number]   Crissa Sowder C,

## 2015-01-01 ENCOUNTER — Telehealth: Payer: Self-pay | Admitting: Family Medicine

## 2015-01-01 NOTE — Telephone Encounter (Signed)
Pt was no show 12/28/14 1:30pm, cpe appt, called to reschedule, cell # not working #, home # no answering machine, mailing letter, charge for no show?

## 2015-01-01 NOTE — Telephone Encounter (Signed)
Yes- please charge 

## 2015-01-05 DIAGNOSIS — J069 Acute upper respiratory infection, unspecified: Secondary | ICD-10-CM | POA: Insufficient documentation

## 2015-01-05 DIAGNOSIS — D509 Iron deficiency anemia, unspecified: Secondary | ICD-10-CM | POA: Insufficient documentation

## 2015-01-05 NOTE — Progress Notes (Signed)
Patient ID: Beverly Black, female   DOB: 1928/11/30, 81 y.o.   MRN: [id number]  MRN: [id number] Name: Beverly Black  Sex: female Age: 81 y.o. DOB: 07-Sep-1928  [facility] #: [facility] Facility/Room:515 Level Of Care: SNF Provider: Roena Black Emergency Contacts: Extended Emergency Contact Information Primary Emergency Contact: Beverly Black Address: [address]          [name] [address] [place] Home Phone: [phone] Relation: Daughter Secondary Emergency Contact: Beverly Black [place] Home Phone: [phone] Relation: Daughter  Code Status:   Allergies: Sulfa antibiotics  Chief Complaint  Patient presents with  . Acute Visit   acute visit secondary to anemia-follow-up URI  HPI: Patient is 81 y.o. female who in the pas couple months  has broken her arm, sent home, and  been hospitalized for PNA and respiratory failure. Pt was admitted to SNF with generalized weakness and inability to be cared for at home even with family  In regards to pneumonia she was treated in the hospital her daughter felt she may be having still some symptoms and not follow-up chest x-ray showed a suspected infiltrate she has completed a course of Augmentin for this and appears to be clinically stable today in this regards.  We also been following her hemoglobin which appears to be trending down at 7.9 on lab done most recently on July 26 per chart review it appears as been a lot of variability here it was down to 8.8 in the hospital and actually was 9.0 on July 5 per looking back further back in [facility] hemoglobin was up to 10.6 and previous to that had been up at 11 at one point.   She continues to be fairly weak and frail with intermitted appetite which we have been following her labs actually except for the hemoglobin appeared to be stable   Past Medical History  Diagnosis Date  . Hypertension   . Hyperlipidemia   . Stroke 10/2008  . Aneurysm     of the  eye  . Osteoporosis     Past Surgical History  Procedure Laterality Date  . Eye surgery    . Partial hysterectomy  1968      Medication List       This list is accurate as of: 12/08/14 11:59 PM.  Always use your most recent med list.               amLODipine 10 MG tablet  Commonly known as:  NORVASC  TAKE 1 TABLET AT BEDTIME     aspirin 81 MG tablet  Take 81 mg by mouth daily.     atenolol 50 MG tablet  Commonly known as:  TENORMIN  TAKE 1 TABLET TWICE A DAY     atorvastatin 40 MG tablet  Commonly known as:  LIPITOR  TAKE 1 TABLET DAILY     BIOFREEZE 4 % Gel  Generic drug:  Menthol (Topical Analgesic)  Apply 1 application topically 2 (two) times daily.     budesonide 0.25 MG/2ML nebulizer solution  Commonly known as:  PULMICORT  Take 2 mLs (0.25 mg total) by nebulization 2 (two) times daily.     cholecalciferol 1000 UNITS tablet  Commonly known as:  VITAMIN D  Take 1,000 Units by mouth daily.     docusate sodium 100 MG capsule  Commonly known as:  COLACE  Take 100 mg by mouth 2 (two) times daily.     ENSURE  Take 237  mLs by mouth daily. At 2 pm     feeding supplement (ENSURE ENLIVE) Liqd  Take 237 mLs by mouth 3 (three) times daily between meals.     escitalopram 10 MG tablet  Commonly known as:  LEXAPRO  Take 10 mg by mouth daily.     fexofenadine 180 MG tablet  Commonly known as:  ALLEGRA  Take 180 mg by mouth daily.     ipratropium-albuterol 0.5-2.5 (3) MG/3ML Soln  Commonly known as:  DUONEB  Take 3 mLs by nebulization every 4 (four) hours as needed (SOB/wheezing).     LORazepam 0.5 MG tablet  Commonly known as:  ATIVAN  Take 1 tablet (0.5 mg total) by mouth every 8 (eight) hours.     mirtazapine 30 MG tablet  Commonly known as:  REMERON  TAKE 1 TABLET AT BEDTIME     MULTIVITAMIN ADULT PO  Take 1 tablet by mouth daily.     oxyCODONE 5 MG immediate release tablet  Commonly known as:  Oxy IR/ROXICODONE  Take 1 tablet (5 mg total) by  mouth every 6 (six) hours as needed for severe pain.     pantoprazole 40 MG tablet  Commonly known as:  PROTONIX  Take 1 tablet (40 mg total) by mouth 2 (two) times daily.     polyethylene glycol packet  Commonly known as:  MIRALAX / GLYCOLAX  Take 17 g by mouth daily. To be hold for diarrhea     Potassium Chloride ER 20 MEQ Tbcr  Take 1 tablet by mouth daily.     PREMARIN 0.3 MG tablet  Generic drug:  estrogens (conjugated)  TAKE 1 TABLET DAILY FOR 21 DAYS, THEN DO NOT TAKE FOR 7 DAYS     PRESCRIPTION MEDICATION  Pt to take 1 two times a day to promote weight gain. MAGIC CUP     traMADol 50 MG tablet  Commonly known as:  ULTRAM  Take 1 tablet (50 mg total) by mouth every 6 (six) hours as needed for moderate pain.     ZETIA 10 MG tablet  Generic drug:  ezetimibe  TAKE 1 TABLET DAILY          Immunization History  Administered Date(s) Administered  . Influenza,inj,Quad PF,36+ Mos 04/16/2013, 05/06/2014    Social History  Substance Use Topics  . Smoking status: Current Every Day Smoker -- 0.50 packs/day for 50 years  . Smokeless tobacco: Not on file  . Alcohol Use: No    Family history is noncontributory    Review of Systems  DATA OBTAINED: from patient, nurse,  family member GENERAL:  no fevers,  I  SKIN: No itching, rash EYES: No eye pain, redness, discharge EARS: No earache, tinnitus, change in hearing NOSE: No congestion, drainage or bleeding  MOUTH/THROAT: No mouth or tooth pain, No sore throat RESPIRATORY: Does not really complain of cough or shortness of breath CARDIAC: No chest pain, palpitations, lower extremity edema  GI: No abdominal pain, is now having regular bowel movements  GU: No dysuria, frequency or urgency, or incontinence  MUSCULOSKELETAL: No unrelieved bone/joint pain NEUROLOGIC: No headache, dizziness  PSYCHIATRIC: No overt anxiety or sadness, No behavior issue. Daughter feels that patient is having some dementia type symptoms  progressive psych consult is pending   Filed Vitals:   12/08/14 2008  BP: 135/68  Pulse: 60  Temp: 97 F (36.1 C)  Resp: 18    Physical Exam  GENERAL APPEARANCE: Alert, conversant,  No acute distress. Somewhat frail appearing  SKIN: No diaphoresis rash HEAD: Normocephalic, atraumatic  EYES: Conjunctiva/lids clear. Pupils round, reactive. EOMs intact.  EARS: External exam WNL, canals clear. Hearing grossly normal.  NOSE: No deformity or discharge.  MOUTH/THROAT: Oropharynx is clear mucous membranes moist  RESPIRATORY: Breathing is unlabored-somewhat reduced breath sounds with no congestion noted  CARDIOVASCULAR: Heart RRR no murmurs, rubs or gallops. No peripheral edema.   GASTROINTESTINAL: Abdomen is soft, does not appear to be acutely tender some mild tenderness to palpation although this may be due more to the invasive maneuver bowel sounds are active GENITOURINARY: Bladder non tender, not distended   rectal-I did attempt to doan occult test--there was not really enough stool sample to test was no evidence of overt rectal bleeding  MUSCULOSKELETAL: hx  right humerus fracture  she has a positive radial pulse grip strength appears to be intact NEUROLOGIC:  Cranial nerves 2-12 grossly intact. Moves all extremities -no lateralizing findings PSYCHIATRIC: She is oriented to self month year i date But has significant confusion at times recalling names --and previous details   Patient Active Problem List   Diagnosis Date Noted  . Anemia 01/05/2015  . Acute upper respiratory infection 01/05/2015  . Dementia with behavioral disturbance 12/31/2014  . Iron deficiency anemia 12/10/2014  . Stomach discomfort 11/30/2014  . Subacute confusional state 11/30/2014  . Acute respiratory failure with hypoxia 11/11/2014  . AKI (acute kidney injury) 11/11/2014  . Hypernatremia 11/11/2014  . Hypokalemia 11/09/2014  . COPD exacerbation   . Acute on chronic diastolic heart failure   . Hypoxia   .  SOB (shortness of breath)   . Pressure ulcer stage II 11/02/2014  . Palliative care encounter 11/02/2014  . HCAP (healthcare-associated pneumonia) 11/01/2014  . Leukocytosis 10/15/2014  . Closed fracture of part of upper end of humerus 10/14/2014  . Poor balance 11/25/2013  . History of stroke 11/25/2013  . 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 Myalgia 09/05/2012  . Disequilibrium 09/05/2012  . Hypertension, essential 09/05/2012  . 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 Mobility impaired 07/15/2012  . Physical exam, routine 12/07/2011  . Cerumen impaction 04/04/2011  . Trapezius muscle spasm 04/04/2011  . HTN (hypertension) 11/30/2010  . Hyperlipidemia 11/30/2010  . CVA (cerebral infarction) 11/30/2010  . Postmenopausal HRT (hormone replacement therapy) 11/30/2010  . Anxiety 11/30/2010  . Ankle weakness 11/30/2010    Labs.  12/01/2014.  WBC 7.2 hemoglobin 7.9 platelets 385 MCV 84.  Sodium 136 potassium 4.0 BUN 27 creatinine 0.6-albumin 2.1 otherwise liver function tests within normal limits.  TSH-3.76.    Sodium 137 potassium 4 BUN 20 creatinine 0.5 CO2 31.  WBC 11.0 hemoglobin 9 platelets 373 absolute neutrophils 8.1.    CBC    Component Value Date/Time   WBC 11.4* 11/05/2014 0225   RBC 3.23* 11/05/2014 0225   HGB 8.8* 11/05/2014 0225   HCT 28.0* 11/05/2014 0225   PLT 391 11/05/2014 0225   MCV 86.7 11/05/2014 0225   LYMPHSABS 1.1 11/01/2014 1050   MONOABS 1.1* 11/01/2014 1050   EOSABS 0.0 11/01/2014 1050   BASOSABS 0.0 11/01/2014 1050    CMP     Component Value Date/Time   NA 145 11/05/2014 0225   K 3.0* 11/05/2014 0225   CL 110 11/05/2014 0225   CO2 28 11/05/2014 0225   GLUCOSE 96 11/05/2014 0225   BUN 19 11/05/2014 0225   CREATININE 0.67 11/05/2014 0225   CALCIUM 7.7* 11/05/2014 0225   PROT 4.8* 11/01/2014 1050   ALBUMIN 1.3* 11/01/2014 1050   AST 45*  11/01/2014 1050   ALT 24 11/01/2014 1050   ALKPHOS 108 11/01/2014 1050   BILITOT 0.6  11/01/2014 1050   GFRNONAA >60 11/05/2014 0225   GFRAA >60 11/05/2014 0225    Assessment and Plan  Anemia unspecified-as noted above she has quite variable hemoglobins it appears to have dipped down to 7.9 on most recent lab we will need to guaiac her stools-also will order iron studies reticulocyte count ferritin level serum iron total iron-binding capacity B-12 folate-also will recheck a CBC tomorrow stat.  I did speak with her daughter about any possible history of rectal bleeding anemia but apparently she has quite a benign history in this regards-clinically she appears to be stable frail with her multiple issues but does not appear to be precipitously declining.  Will await laboratory results to do note she is on a proton pump inhibitor-she is also on aspirin-again there was no overt rectal bleeding on exam today.  #2-history pneumonia clinically appears improved status post antibiotic x-ray did show diffuse pneumonia infiltrate before antibiotic therapy -she is not coughing or complaining of shortness of breath.  [id number] note greater than 35 minutes spent assessing patient-discussing patient's medical history anemia history with her daughter-reviewing her chart extensively-and coordinating and formulating plan of care-of note greater than 50% of time spent dictating plan of care         Ingris Pasquarella C,

## 2015-01-10 ENCOUNTER — Other Ambulatory Visit: Payer: Self-pay | Admitting: Family Medicine

## 2015-01-12 ENCOUNTER — Encounter: Payer: Self-pay | Admitting: Family Medicine

## 2015-01-12 NOTE — Telephone Encounter (Signed)
Medication filled to pharmacy as requested.   

## 2015-01-14 ENCOUNTER — Encounter: Payer: Self-pay | Admitting: Internal Medicine

## 2015-01-14 ENCOUNTER — Non-Acute Institutional Stay (SKILLED_NURSING_FACILITY): Payer: Medicare Other | Admitting: Internal Medicine

## 2015-01-14 DIAGNOSIS — D649 Anemia, unspecified: Secondary | ICD-10-CM | POA: Diagnosis not present

## 2015-01-14 DIAGNOSIS — S42301G Unspecified fracture of shaft of humerus, right arm, subsequent encounter for fracture with delayed healing: Secondary | ICD-10-CM | POA: Diagnosis not present

## 2015-01-14 NOTE — Progress Notes (Signed)
Patient ID: Beverly Black, female   DOB: Sep 12, 1928, 81 y.o.   MRN: [id number]  MRN: [id number] Name: Beverly Black  Sex: female Age: 81 y.o. DOB: 06/19/1928  [name] Facility/Room:107 Level Of Care: SNF Provider: Roena Malady Emergency Contacts: Extended Emergency Contact Information Primary Emergency Contact: [name],[name] Address: [address]          [name] [address] [place] Home Phone: [phone] Relation: Daughter Secondary Emergency Contact: [name] [place] Home Phone: [phone] Relation: Daughter  Code Status:   Allergies: Sulfa antibiotics  Chief Complaint  Patient presents with  . Acute Visit    Acute visit follow-up anemia- history of fall  HPI: Patient is 81 y.o. female a complicated history including a right humerus fracture-history of pneumonia and respiratory failure-general weakness.  Patient is followed by orthopedics for her fracture apparently this is stable.  Main issue recently has been increased agitation and anxiety-with suspicions for progressing dementia.  She has been seen recently by the psychiatric nurse practitioner and recently was started on Namenda XR-- traits rated up to 14 mg a day.  Apparently this is stabilized somewhat.-- although she did fall it appears August 26 she did have x-rays with did not show any acute changes or fractures she does have a history of a communicated fracture of the neck of the humerus - this does persist again I think compliance with patient keeping her arm in a sling has been quite a challenge  Also has a history of anemia thought to have an element of chronic disease she has been started on iron - hemoglobin most recent lab 7.2 on August 30 this is down somewhat from her recent baseline which has been in the high sevens generally   Occult blood testing has been ordered when I did it appear to be negative although there was minimal sample  available. Clinically she appears to be at baseline vital signs are stable.      -.  .  .    .   Past Medical History  Diagnosis Date  . Hypertension   . Hyperlipidemia   . Stroke 10/2008  . Aneurysm     of the eye  . Osteoporosis     Past Surgical History  Procedure Laterality Date  . Eye surgery    . Partial hysterectomy  1968      Medication List       This list is accurate as of: 01/14/15 11:59 PM.  Always use your most recent med list.               amLODipine 10 MG tablet  Commonly known as:  NORVASC  TAKE 1 TABLET AT BEDTIME     aspirin 81 MG tablet  Take 81 mg by mouth daily.     atenolol 50 MG tablet  Commonly known as:  TENORMIN  TAKE 1 TABLET TWICE A DAY     atorvastatin 40 MG tablet  Commonly known as:  LIPITOR  TAKE 1 TABLET DAILY     BIOFREEZE 4 % Gel  Generic drug:  Menthol (Topical Analgesic)  Apply 1 application topically 2 (two) times daily.     budesonide 0.25 MG/2ML nebulizer solution  Commonly known as:  PULMICORT  Take 2 mLs (0.25 mg total) by nebulization 2 (two) times daily.     cholecalciferol 1000 UNITS tablet  Commonly known as:  VITAMIN D  Take 1,000 Units by mouth daily.  docusate sodium 100 MG capsule  Commonly known as:  COLACE  Take 100 mg by mouth 2 (two) times daily.     ENSURE  Take 237 mLs by mouth daily. At 2 pm     feeding supplement (ENSURE ENLIVE) Liqd  Take 237 mLs by mouth 3 (three) times daily between meals.     escitalopram 10 MG tablet  Commonly known as:  LEXAPRO  Take 10 mg by mouth daily.     ferrous fumarate 325 (106 FE) MG Tabs tablet  Commonly known as:  HEMOCYTE - 106 mg FE  Take 1 tablet by mouth daily.     fexofenadine 180 MG tablet  Commonly known as:  ALLEGRA  Take 180 mg by mouth daily.     ipratropium-albuterol 0.5-2.5 (3) MG/3ML Soln  Commonly known as:  DUONEB  Take 3 mLs by nebulization every 4 (four) hours as needed (SOB/wheezing).     LORazepam 0.5 MG tablet   Commonly known as:  ATIVAN  Take 1 tablet (0.5 mg total) by mouth every 8 (eight) hours.     mirtazapine 30 MG tablet  Commonly known as:  REMERON  TAKE 1 TABLET AT BEDTIME     MULTIVITAMIN ADULT PO  Take 1 tablet by mouth daily.     NAMENDA XR 14 MG Cp24 24 hr capsule  Generic drug:  memantine  Take 14 mg by mouth daily.     oxyCODONE 5 MG immediate release tablet  Commonly known as:  Oxy IR/ROXICODONE  Take 1 tablet (5 mg total) by mouth every 6 (six) hours as needed for severe pain.     pantoprazole 40 MG tablet  Commonly known as:  PROTONIX  Take 1 tablet (40 mg total) by mouth 2 (two) times daily.     polyethylene glycol packet  Commonly known as:  MIRALAX / GLYCOLAX  Take 17 g by mouth daily. To be hold for diarrhea     Potassium Chloride ER 20 MEQ Tbcr  Take 1 tablet by mouth daily.     PREMARIN 0.3 MG tablet  Generic drug:  estrogens (conjugated)  TAKE 1 TABLET DAILY FOR 21 DAYS, THEN DO NOT TAKE FOR 7 DAYS     PRESCRIPTION MEDICATION  Pt to take 1 two times a day to promote weight gain. MAGIC CUP     traMADol 50 MG tablet  Commonly known as:  ULTRAM  Take 1 tablet (50 mg total) by mouth every 6 (six) hours as needed for moderate pain.     ZETIA 10 MG tablet  Generic drug:  ezetimibe  TAKE 1 TABLET DAILY        Meds ordered this encounter  Medications  . ferrous fumarate (HEMOCYTE - 106 MG FE) 325 (106 FE) MG TABS tablet    Sig: Take 1 tablet by mouth daily.    Immunization History  Administered Date(s) Administered  . Influenza,inj,Quad PF,36+ Mos 04/16/2013, 05/06/2014    Social History  Substance Use Topics  . Smoking status: Current Every Day Smoker -- 0.50 packs/day for 50 years  . Smokeless tobacco: Not on file  . Alcohol Use: No    Review of Systems  DATA OBTAINED: from patient, nurse limited secondary to dementia,  GENERAL:  no fevers, fatigue, appetite changes SKIN: No itching, rash HEENT: No complaint RESPIRATORY: No cough,  wheezing, SOB CARDIAC: No chest pain, palpitations, lower extremity edema  GI: No abdominal pain, No N/V/D or constipation, No heartburn or reflux  GU: No dysuria, frequency  or urgency, or incontinence  MUSCULOSKELETAL:  history of right humerus fracture this appears to be relatively stable again did have a recent fall NEUROLOGIC: No headache, dizziness  PSYCHIATRIC:has anxiety-evidence of dementia    Physical Exam  temperature is 97.9 pulse 70 respirations 18 blood pressure 127/85  GENERAL APPEARANCE: Alert, conversant,Thin WF No acute distress sitting comfortably in her wheelchair this afternoon SKIN: No diaphoresis rash;  HEENT: Unremarkable this membranes appear fairly moist RESPIRATORY: Breathing is even, unlabored. Lung sounds are clear   CARDIOVASCULAR: Heart RRR no murmurs, rubs or gallops. No peripheral edema  GASTROINTESTINAL: Abdomen is soft, non-tender, somewhat protuberant which is baseline w/ normal bowel sounds.   MUSCULOSKELETAL: No abnormal joints or musculature--again does have the right humerus fracture with limited mobility of her right arm and shoulder which is not Did not really note any further deformities erythema or edema NEUROLOGIC: Cranial nerves 2-12 grossly intact. Moves all extremities PSYCHIATRIC: Mood and affect appropriate to situation, she is pleasant although somewhat confused does answer questions but does not speak a whole lot- her staff continues to have periods of agitation at times   Patient Active Problem List   Diagnosis Date Noted  . Anemia 01/05/2015  . Acute upper respiratory infection 01/05/2015  . Dementia with behavioral disturbance 12/31/2014  . Iron deficiency anemia 12/10/2014  . Stomach discomfort 11/30/2014  . Subacute confusional state 11/30/2014  . Acute respiratory failure with hypoxia 11/11/2014  . AKI (acute kidney injury) 11/11/2014  . Hypernatremia 11/11/2014  . Hypokalemia 11/09/2014  . COPD exacerbation   . Acute on  chronic diastolic heart failure   . Hypoxia   . SOB (shortness of breath)   . Pressure ulcer stage II 11/02/2014  . Palliative care encounter 11/02/2014  . HCAP (healthcare-associated pneumonia) 11/01/2014  . Leukocytosis 10/15/2014  . Closed fracture of part of upper end of humerus 10/14/2014  . Poor balance 11/25/2013  . History of stroke 11/25/2013  . 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 Myalgia 09/05/2012  . Disequilibrium 09/05/2012  . Hypertension, essential 09/05/2012  . 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 Mobility impaired 07/15/2012  . Physical exam, routine 12/07/2011  . Cerumen impaction 04/04/2011  . Trapezius muscle spasm 04/04/2011  . HTN (hypertension) 11/30/2010  . Hyperlipidemia 11/30/2010  . CVA (cerebral infarction) 11/30/2010  . Postmenopausal HRT (hormone replacement therapy) 11/30/2010  . Anxiety 11/30/2010  . Ankle weakness 11/30/2010    labs.  01/05/2015.  Sodium 140 potassium 4.3 BUN 31 creatinine 0.7.   WBC is 7.0 hemoglobin 7.2 platelets 357.  MCV 80.4.    12/14/2014.  Sodium 141 potassium 4.1 BUN 20 creatinine 0.5.  Hemoglobin 8.2 WBC 7.6 platelets 335.  I do note hemoglobin 7.9 on July 26 12/09/2014.  Sodium 139 potassium 3.9 BUN 22 creatinine 0.6.  WBC 7.7 hemoglobin 8.3 platelets 394.  Ferritin 205-folate 7.2-iron 21-TIBC 160.  Vitamin B12 384.    CBC    Component Value Date/Time   WBC 11.4* 11/05/2014 0225   RBC 3.23* 11/05/2014 0225   HGB 8.8* 11/05/2014 0225   HCT 28.0* 11/05/2014 0225   PLT 391 11/05/2014 0225   MCV 86.7 11/05/2014 0225   LYMPHSABS 1.1 11/01/2014 1050   MONOABS 1.1* 11/01/2014 1050   EOSABS 0.0 11/01/2014 1050   BASOSABS 0.0 11/01/2014 1050    CMP     Component Value Date/Time   NA 145 11/05/2014 0225   K 3.0* 11/05/2014 0225   CL 110 11/05/2014 0225   CO2 28 11/05/2014 0225  GLUCOSE 96 11/05/2014 0225   BUN 19 11/05/2014 0225   CREATININE 0.67 11/05/2014 0225   CALCIUM 7.7*  11/05/2014 0225   PROT 4.8* 11/01/2014 1050   ALBUMIN 1.3* 11/01/2014 1050   AST 45* 11/01/2014 1050   ALT 24 11/01/2014 1050   ALKPHOS 108 11/01/2014 1050   BILITOT 0.6 11/01/2014 1050   GFRNONAA >60 11/05/2014 0225   GFRAA >60 11/05/2014 0225    Assessment and Plan  #1 anemia-appears to have an element chronic disease and possible iron deficiency she has been started on iron hemoglobin has dropped a bit would like to update this to see where we stand currently-continue to guaiac stools 3 clinically she appears to be stable in this regards this will have to be watched.  I do note she is on a proton pump inhibitor.  #2 history of fall does not appear to have any new injuries here she does have the chronic right humerus fracture with some displacement - pain at this point appears to be controlled it appears she is receiving tramadol  rtn TID and has OxyIR as needed     [id number]   Orvill Coulthard C,

## 2015-01-21 ENCOUNTER — Encounter: Payer: Self-pay | Admitting: Internal Medicine

## 2015-01-21 ENCOUNTER — Non-Acute Institutional Stay (SKILLED_NURSING_FACILITY): Payer: Medicare Other | Admitting: Internal Medicine

## 2015-01-21 DIAGNOSIS — R634 Abnormal weight loss: Secondary | ICD-10-CM | POA: Insufficient documentation

## 2015-01-21 DIAGNOSIS — R627 Adult failure to thrive: Secondary | ICD-10-CM | POA: Diagnosis not present

## 2015-01-21 DIAGNOSIS — D509 Iron deficiency anemia, unspecified: Secondary | ICD-10-CM

## 2015-01-21 NOTE — Progress Notes (Signed)
Patient ID: Beverly Black, female   DOB: July 29, 1928, 81 y.o.   MRN: [id number]   MRN: [id number] Name: Beverly Black  Sex: female Age: 81 y.o. DOB: 05/03/1929  [name] Facility/Room:107 Level Of Care: SNF Provider: Roena Malady Emergency Contacts: Extended Emergency Contact Information Primary Emergency Contact: BeverlyElaine Address: [address]          [place] [address] [place] Home Phone: [phone] Relation: Daughter Secondary Emergency Contact: Beverly Black [place] Home Phone: [phone] Relation: Daughter  Code Status:   Allergies: Sulfa antibiotics  Chief Complaint  Patient presents with  . Acute Visit    Acute visit follow-up anemia- area thrive  HPI: Patient is 81 y.o. female a complicated history including a right humerus fracture-history of pneumonia and respiratory failure-general weakness--FTT.  Patient is followed by orthopedics for her fracture apparently this is stable.     Also has a history of anemia thought to have an element of chronic disease she has been started on iron - hemoglobin most recent lab 7.2 done on September 9 this is stable with a hemoglobin of 7.2 on August 30  Hbg was 8.3 on August 3 since then  has been in the sevens   Occult blood testing has been  Negative  Iron studies indicate an element of chronic disease as noted above.  Clinically she appears to be any stronger in fact has gained weight which is encouraging she has been started on Remeron.  Albumin most recently on September 13 was 2.3        -.  .  .    .   Past Medical History  Diagnosis Date  . Hypertension   . Hyperlipidemia   . Stroke 10/2008  . Aneurysm     of the eye  . Osteoporosis     Past Surgical History  Procedure Laterality Date  . Eye surgery    . Partial hysterectomy  1968      Medication List       This list is accurate as of: 01/21/15 11:59 PM.  Always use your  most recent med list.               amLODipine 10 MG tablet  Commonly known as:  NORVASC  TAKE 1 TABLET AT BEDTIME     aspirin 81 MG tablet  Take 81 mg by mouth daily.     atenolol 50 MG tablet  Commonly known as:  TENORMIN  TAKE 1 TABLET TWICE A DAY     atorvastatin 40 MG tablet  Commonly known as:  LIPITOR  TAKE 1 TABLET DAILY     BIOFREEZE 4 % Gel  Generic drug:  Menthol (Topical Analgesic)  Apply 1 application topically 2 (two) times daily.     budesonide 0.25 MG/2ML nebulizer solution  Commonly known as:  PULMICORT  Take 2 mLs (0.25 mg total) by nebulization 2 (two) times daily.     cholecalciferol 1000 UNITS tablet  Commonly known as:  VITAMIN D  Take 1,000 Units by mouth daily.     docusate sodium 100 MG capsule  Commonly known as:  COLACE  Take 100 mg by mouth 2 (two) times daily.     ENSURE  Take 237 mLs by mouth daily. At 2 pm     feeding supplement (ENSURE ENLIVE) Liqd  Take 237 mLs by mouth 3 (three) times daily between meals.     escitalopram 10 MG tablet  Commonly known as:  LEXAPRO  Take 10 mg by mouth daily.     ferrous fumarate 325 (106 FE) MG Tabs tablet  Commonly known as:  HEMOCYTE - 106 mg FE  Take 1 tablet by mouth daily.     fexofenadine 180 MG tablet  Commonly known as:  ALLEGRA  Take 180 mg by mouth daily.     ipratropium-albuterol 0.5-2.5 (3) MG/3ML Soln  Commonly known as:  DUONEB  Take 3 mLs by nebulization every 4 (four) hours as needed (SOB/wheezing).     LORazepam 0.5 MG tablet  Commonly known as:  ATIVAN  Take 1 tablet (0.5 mg total) by mouth every 8 (eight) hours.     mirtazapine 30 MG tablet  Commonly known as:  REMERON  TAKE 1 TABLET AT BEDTIME     MULTIVITAMIN ADULT PO  Take 1 tablet by mouth daily.     NAMENDA XR 14 MG Cp24 24 hr capsule  Generic drug:  memantine  Take 14 mg by mouth daily.     oxyCODONE 5 MG immediate release tablet  Commonly known as:  Oxy IR/ROXICODONE  Take 1 tablet (5 mg total) by  mouth every 6 (six) hours as needed for severe pain.     pantoprazole 40 MG tablet  Commonly known as:  PROTONIX  Take 1 tablet (40 mg total) by mouth 2 (two) times daily.     polyethylene glycol packet  Commonly known as:  MIRALAX / GLYCOLAX  Take 17 g by mouth daily. To be hold for diarrhea     Potassium Chloride ER 20 MEQ Tbcr  Take 1 tablet by mouth daily.     PREMARIN 0.3 MG tablet  Generic drug:  estrogens (conjugated)  TAKE 1 TABLET DAILY FOR 21 DAYS, THEN DO NOT TAKE FOR 7 DAYS     PRESCRIPTION MEDICATION  Pt to take 1 two times a day to promote weight gain. MAGIC CUP     traMADol 50 MG tablet  Commonly known as:  ULTRAM  Take 1 tablet (50 mg total) by mouth every 6 (six) hours as needed for moderate pain.     ZETIA 10 MG tablet  Generic drug:  ezetimibe  TAKE 1 TABLET DAILY          Immunization History  Administered Date(s) Administered  . Influenza,inj,Quad PF,36+ Mos 04/16/2013, 05/06/2014    Social History  Substance Use Topics  . Smoking status: Current Every Day Smoker -- 0.50 packs/day for 50 years  . Smokeless tobacco: Not on file  . Alcohol Use: No    Review of Systems  DATA OBTAINED: from patient, nurse limited secondary to dementia,  GENERAL:  no fevers, fatigue, apparently he is eating better SKIN: No itching, rash HEENT: No complaint RESPIRATORY: No cough, wheezing, SOB CARDIAC: No chest pain, palpitations, lower extremity edema  GI: No abdominal pain, No N/V/D or constipation, No heartburn or reflux  GU: No dysuria, frequency or urgency, or incontinence  MUSCULOSKELETAL:  history of right humerus fracture this appears to be relatively stable  NEUROLOGIC: No headache, dizziness  PSYCHIATRIC:has anxiety-evidence of dementia    Physical Exam  Temperature 98.2 pulse 70 respirations 20 blood pressure 132/70  GENERAL APPEARANCE: Alert, conversant,Thin WF No acute distress sitting comfortably in her wheelchair this afternoon--he is  looking a bit stronger SKIN: No diaphoresis rash;  HEENT: Unremarkable this membranes appear fairly moist RESPIRATORY: Breathing is even, unlabored. Lung sounds are clear   CARDIOVASCULAR: Heart RRR with distant heart sounds no  murmurs, rubs or gallops. No peripheral edema  GASTROINTESTINAL: Abdomen is soft, non-tender, somewhat protuberant which is baseline w/ normal bowel sounds.   MUSCULOSKELETAL: No abnormal joints or musculature--again does have the right humerus fracture with limited mobility of her right arm and shoulder which is not Did not really note any further deformities erythema or edema NEUROLOGIC: Cranial nerves 2-12 grossly intact. Moves all extremities PSYCHIATRIC: Mood and affect appropriate to situation, she is pleasant although somewhat confused does answer questions but does not speak a whole lot- her staff continues to have periods of agitation at times--but apparently this has improved somewhat recently   Patient Active Problem List   Diagnosis Date Noted  . Loss of weight 01/21/2015  . Anemia 01/05/2015  . Acute upper respiratory infection 01/05/2015  . Dementia with behavioral disturbance 12/31/2014  . Iron deficiency anemia 12/10/2014  . Stomach discomfort 11/30/2014  . Subacute confusional state 11/30/2014  . Acute respiratory failure with hypoxia 11/11/2014  . AKI (acute kidney injury) 11/11/2014  . Hypernatremia 11/11/2014  . Hypokalemia 11/09/2014  . COPD exacerbation   . Acute on chronic diastolic heart failure   . Hypoxia   . SOB (shortness of breath)   . Pressure ulcer stage II 11/02/2014  . Palliative care encounter 11/02/2014  . HCAP (healthcare-associated pneumonia) 11/01/2014  . Leukocytosis 10/15/2014  . Closed fracture of part of upper end of humerus 10/14/2014  . Poor balance 11/25/2013  . History of stroke 11/25/2013  . 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 Myalgia 09/05/2012  . Disequilibrium 09/05/2012  . Hypertension, essential  09/05/2012  . 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 Mobility impaired 07/15/2012  . Physical exam, routine 12/07/2011  . Cerumen impaction 04/04/2011  . Trapezius muscle spasm 04/04/2011  . HTN (hypertension) 11/30/2010  . Hyperlipidemia 11/30/2010  . CVA (cerebral infarction) 11/30/2010  . Postmenopausal HRT (hormone replacement therapy) 11/30/2010  . Anxiety 11/30/2010  . Ankle weakness 11/30/2010   Labs.  01/19/2015.  Sodium 134 potassium 3.8 BUN 13 creatinine 0.5  Albumin 2.3.  01/15/2015.  WBC 6.0 hemoglobin 7.2 platelets 277  01/05/2015.  Sodium 140 potassium 4.3 BUN 31 creatinine 0.7.   WBC is 7.0 hemoglobin 7.2 platelets 357.  MCV 80.4.    12/14/2014.  Sodium 141 potassium 4.1 BUN 20 creatinine 0.5.  Hemoglobin 8.2 WBC 7.6 platelets 335.  I do note hemoglobin 7.9 on July 26 12/09/2014.  Sodium 139 potassium 3.9 BUN 22 creatinine 0.6.  WBC 7.7 hemoglobin 8.3 platelets 394.  Ferritin 205-folate 7.2-iron 21-TIBC 160.  Vitamin B12 384.    CBC    Component Value Date/Time   WBC 11.4* 11/05/2014 0225   RBC 3.23* 11/05/2014 0225   HGB 8.8* 11/05/2014 0225   HCT 28.0* 11/05/2014 0225   PLT 391 11/05/2014 0225   MCV 86.7 11/05/2014 0225   LYMPHSABS 1.1 11/01/2014 1050   MONOABS 1.1* 11/01/2014 1050   EOSABS 0.0 11/01/2014 1050   BASOSABS 0.0 11/01/2014 1050    CMP     Component Value Date/Time   NA 145 11/05/2014 0225   K 3.0* 11/05/2014 0225   CL 110 11/05/2014 0225   CO2 28 11/05/2014 0225   GLUCOSE 96 11/05/2014 0225   BUN 19 11/05/2014 0225   CREATININE 0.67 11/05/2014 0225   CALCIUM 7.7* 11/05/2014 0225   PROT 4.8* 11/01/2014 1050   ALBUMIN 1.3* 11/01/2014 1050   AST 45* 11/01/2014 1050   ALT 24 11/01/2014 1050   ALKPHOS 108 11/01/2014 1050   BILITOT 0.6 11/01/2014 1050  GFRNONAA >60 11/05/2014 0225   GFRAA >60 11/05/2014 0225    Assessment and Plan  #1 anemia-appears to have an element chronic disease and possible  iron deficiency she has been started on iron  Hemoglobin currently is 7.2 this is stable with previous lab --clinically she appears actually been doing a bit better will have to keep an eye on this. He continues on a proton pump inhibitor.  So far occult blood testing has been negative.  #2 failure to thrive per dietary note she is gaining weight she appears to be getting a bit stronger this is encouraging she is on Remeron.  [id number]   .   d    [id number]   Misha Vanoverbeke C,

## 2015-02-15 ENCOUNTER — Telehealth: Payer: Self-pay | Admitting: Family Medicine

## 2015-02-15 NOTE — Telephone Encounter (Signed)
Patient fell and got hurt daughter forgot about her appt.  She will be seeing the Dr at [facility] and she will be seeing the doctor there.  Could you forgive the no show charge   Call daughter [phone] Consuella Lose

## 2015-02-16 NOTE — Telephone Encounter (Signed)
Ok to waive no-show fee

## 2015-02-17 NOTE — Telephone Encounter (Signed)
[facility]

## 2015-03-08 LAB — LIPID PANEL
Cholesterol: 125 mg/dL (ref 0–200)
HDL: 31 mg/dL — AB (ref 35–70)
LDL Cholesterol: 76 mg/dL
Triglycerides: 89 mg/dL (ref 40–160)

## 2015-04-15 ENCOUNTER — Encounter: Payer: Self-pay | Admitting: Internal Medicine

## 2015-04-15 ENCOUNTER — Non-Acute Institutional Stay (SKILLED_NURSING_FACILITY): Payer: Medicare Other | Admitting: Internal Medicine

## 2015-04-15 DIAGNOSIS — D649 Anemia, unspecified: Secondary | ICD-10-CM | POA: Diagnosis not present

## 2015-04-15 DIAGNOSIS — F0391 Unspecified dementia with behavioral disturbance: Secondary | ICD-10-CM | POA: Diagnosis not present

## 2015-04-15 DIAGNOSIS — I10 Essential (primary) hypertension: Secondary | ICD-10-CM

## 2015-04-15 DIAGNOSIS — S42201D Unspecified fracture of upper end of right humerus, subsequent encounter for fracture with routine healing: Secondary | ICD-10-CM

## 2015-04-15 DIAGNOSIS — F03918 Unspecified dementia, unspecified severity, with other behavioral disturbance: Secondary | ICD-10-CM

## 2015-04-15 DIAGNOSIS — J9601 Acute respiratory failure with hypoxia: Secondary | ICD-10-CM | POA: Diagnosis not present

## 2015-04-15 DIAGNOSIS — R627 Adult failure to thrive: Secondary | ICD-10-CM | POA: Diagnosis not present

## 2015-04-15 NOTE — Progress Notes (Signed)
Patient ID: Beverly Black, female   DOB: 03/05/1929, 81 y.o.   MRN: [id number]    MRN: [id number] Name: Beverly Black  Sex: female Age: 81 y.o. DOB: 01/27/1929  [facility] Facility/[facility]:107 Level Of Care: SNF Provider: Roena MaladyLASSEN, Tiyana Galla C Emergency Contacts: Extended Emergency Contact Information Primary Emergency Contact: [name],[name] Address: [address]          [place] [id number] [place] Home Phone: [phone] Relation: Daughter Secondary Emergency Contact: [name],[name]  [place] Home Phone: [phone] Relation: Daughter  Code Status:   Allergies: Sulfa antibiotics  Chief Complaint  Patient presents with  . Medical Management of Chronic Issues    including right humerus fracture anemia dementia failure to thrive hypertension osteoarthritis--history of pneumonia and respiratory failure  HPI: Patient is 81 y.o. female a complicated history including a right humerus fracture-history of pneumonia and respiratory failure-general weakness--FTT.  Patient is followed by orthopedics for her fracture apparently this is stable .     Also has a history of anemia thought to have an element of chronic disease she has been started on iron - h This appears to be helping her hemoglobin is slowly rising most recently 8.4 on 02/19/2015.  Occult blood testing was negative.  She had a period of significant failure to thrive but this has turned around fairly significantly-she is eating and drinking better she is more alert and interactive.  She does have a history of dementia with behaviors but this has been significantly improve recently as well she does continue on Ativan as needed.  She is on Namenda X R.  In regards to appetite stimulation she is on Remeron in this appears to be helping    He does have a history of significant osteoarthritis complicated with her humerus fracture which appears to be stable at this time she is  receiving Biofreeze as well as tramadol and has OxyIR if needed.    Regards hypertension this appears stable with recent blood pressures 130/70-130/60 she is on Norvasc as well as atenolol.  Respiratory status has not been an issue recently as well she is on Pulmicort twice a day as well as albuterol nebulizers as needed      Past Medical History  Diagnosis Date  . Hypertension   . Hyperlipidemia   . Stroke (HCC) 10/2008  . Aneurysm (HCC)     of the eye  . Osteoporosis     Past Surgical History  Procedure Laterality Date  . Eye surgery    . Partial hysterectomy  1968      Medication List       This list is accurate as of: 04/15/15 11:59 PM.  Always use your most recent med list.               amLODipine 10 MG tablet  Commonly known as:  NORVASC  TAKE 1 TABLET AT BEDTIME     aspirin 81 MG tablet  Take 81 mg by mouth daily.     atenolol 50 MG tablet  Commonly known as:  TENORMIN  TAKE 1 TABLET TWICE A DAY     atorvastatin 40 MG tablet  Commonly known as:  LIPITOR  TAKE 1 TABLET DAILY     BIOFREEZE 4 % Gel  Generic drug:  Menthol (Topical Analgesic)  Apply 1 application topically 2 (two) times daily.     budesonide 0.25 MG/2ML nebulizer solution  Commonly known as:  PULMICORT  Take 2 mLs (0.25  mg total) by nebulization 2 (two) times daily.     cholecalciferol 1000 UNITS tablet  Commonly known as:  VITAMIN D  Take 1,000 Units by mouth daily.     docusate sodium 100 MG capsule  Commonly known as:  COLACE  Take 100 mg by mouth 2 (two) times daily.     ENSURE  Take 237 mLs by mouth daily. At 2 pm     feeding supplement (ENSURE ENLIVE) Liqd  Take 237 mLs by mouth 3 (three) times daily between meals.     escitalopram 10 MG tablet  Commonly known as:  LEXAPRO  Take 10 mg by mouth daily.     ferrous fumarate 325 (106 FE) MG Tabs tablet  Commonly known as:  HEMOCYTE - 106 mg FE  Take 1 tablet by mouth daily.     fexofenadine 180 MG tablet   Commonly known as:  ALLEGRA  Take 180 mg by mouth daily.     ipratropium-albuterol 0.5-2.5 (3) MG/3ML Soln  Commonly known as:  DUONEB  Take 3 mLs by nebulization every 4 (four) hours as needed (SOB/wheezing).     LORazepam 0.5 MG tablet  Commonly known as:  ATIVAN  Take 1 tablet (0.5 mg total) by mouth every 8 (eight) hours.     mirtazapine 30 MG tablet  Commonly known as:  REMERON  TAKE 1 TABLET AT BEDTIME     MULTIVITAMIN ADULT PO  Take 1 tablet by mouth daily.     NAMENDA XR 14 MG Cp24 24 hr capsule  Generic drug:  memantine  Take 14 mg by mouth daily.     oxyCODONE 5 MG immediate release tablet  Commonly known as:  Oxy IR/ROXICODONE  Take 1 tablet (5 mg total) by mouth every 6 (six) hours as needed for severe pain.     pantoprazole 40 MG tablet  Commonly known as:  PROTONIX  Take 1 tablet (40 mg total) by mouth 2 (two) times daily.     polyethylene glycol packet  Commonly known as:  MIRALAX / GLYCOLAX  Take 17 g by mouth daily. To be hold for diarrhea     Potassium Chloride ER 20 MEQ Tbcr  Take 1 tablet by mouth daily.     PREMARIN 0.3 MG tablet  Generic drug:  estrogens (conjugated)  TAKE 1 TABLET DAILY FOR 21 DAYS, THEN DO NOT TAKE FOR 7 DAYS     PRESCRIPTION MEDICATION  Pt to take 1 two times a day to promote weight gain. MAGIC CUP     traMADol 50 MG tablet  Commonly known as:  ULTRAM  Take 1 tablet (50 mg total) by mouth every 6 (six) hours as needed for moderate pain.     ZETIA 10 MG tablet  Generic drug:  ezetimibe  TAKE 1 TABLET DAILY          Immunization History  Administered Date(s) Administered  . Influenza,inj,Quad PF,36+ Mos 04/16/2013, 05/06/2014    Social History  Substance Use Topics  . Smoking status: Current Every Day Smoker -- 0.50 packs/day for 50 years  . Smokeless tobacco: Not on file  . Alcohol Use: No    Review of Systems  DATA OBTAINED: from patient, nurse limited secondary to dementia,  GENERAL:  no fevers,  fatigue, continues to eat better SKIN: No itching, rash HEENT: No complaint RESPIRATORY: No cough, wheezing, SOB CARDIAC: No chest pain, palpitations, lower extremity edema  GI: No abdominal pain, No N/V/D or constipation, No heartburn or reflux  GU: No dysuria, frequency or urgency, or incontinence  MUSCULOSKELETAL:  history of right humerus fracture this appears to be relatively stable pain appears to be controlled  NEUROLOGIC: No headache, dizziness  PSYCHIATRIC:has less anxiety-does have dementia but this appears to be more stable with fewer behaviors    Physical Exam  T- 98.6 pulse 64 respirations 18 blood pressure 130/70  GENERAL APPEARANCE: Alert, conversant,Thin WF No acute distress sitting comfortably in her wheelchair this afternoon--she is stronger more interactive r SKIN: No diaphoresis rash;  HEENT: Unremarkable this membranes appear fairly mois--visual acuity appears grossly intact she has prescription lenses t RESPIRATORY: Breathing is even, unlabored. Lung sounds are clear   CARDIOVASCULAR: Heart RRR with distant heart sounds no murmurs, rubs or gallops. No peripheral edema  GASTROINTESTINAL: Abdomen is soft, non-tender, somewhat protuberant which is baseline w/ normal bowel sounds.   MUSCULOSKELETAL: No abnormal joints or musculature--again does have the right humerus fracture with limited mobility of her right arm and shoulder which is no-t new--appears to have less pain here t D NEUROLOGIC: Cranial nerves 2-12 grossly intact. Moves all extremities PSYCHIATRIC: Mood and affect appropriate to situation, she is pleasant although somewhat confused does answer questions Speaks a bit more than when I saw her previously-her periods of agitation apparently have significantly improved   Patient Active Problem List   Diagnosis Date Noted  . Loss of weight 01/21/2015  . Anemia 01/05/2015  . Acute upper respiratory infection 01/05/2015  . Dementia with behavioral disturbance  12/31/2014  . Iron deficiency anemia 12/10/2014  . Stomach discomfort 11/30/2014  . Subacute confusional state 11/30/2014  . Acute respiratory failure with hypoxia (HCC) 11/11/2014  . AKI (acute kidney injury) (HCC) 11/11/2014  . Hypernatremia 11/11/2014  . Hypokalemia 11/09/2014  . COPD exacerbation (HCC)   . Acute on chronic diastolic heart failure (HCC)   . Hypoxia   . SOB (shortness of breath)   . Pressure ulcer stage II 11/02/2014  . Palliative care encounter 11/02/2014  . HCAP (healthcare-associated pneumonia) 11/01/2014  . Leukocytosis 10/15/2014  . Closed fracture of part of upper end of humerus 10/14/2014  . Poor balance 11/25/2013  . History of stroke 11/25/2013  . 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 Myalgia 09/05/2012  . Disequilibrium 09/05/2012  . Hypertension, essential 09/05/2012  . 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 Mobility impaired 07/15/2012  . Physical exam, routine 12/07/2011  . Cerumen impaction 04/04/2011  . Trapezius muscle spasm 04/04/2011  . HTN (hypertension) 11/30/2010  . Hyperlipidemia 11/30/2010  . CVA (cerebral infarction) 11/30/2010  . Postmenopausal HRT (hormone replacement therapy) 11/30/2010  . Anxiety 11/30/2010  . Ankle weakness 11/30/2010   Labs  03/08/2015.  HDL 31--LDL 76--cholesterol 125--triglycerides 89.  02/19/2015.  WBC 6.4 hemoglobin 8.4 platelets 197.  Sodium 138 potassium 3.8 BUN 24 creatinine 0.6.  01/19/2015.  Albumin 2.3 otherwise liver function tests within normal limits.  01/19/2015.  Sodium 134 potassium 3.8 BUN 13 creatinine 0.5  Albumin 2.3.  01/15/2015.  WBC 6.0 hemoglobin 7.2 platelets 277  01/05/2015.  Sodium 140 potassium 4.3 BUN 31 creatinine 0.7.   WBC is 7.0 hemoglobin 7.2 platelets 357.  MCV 80.4.    12/14/2014.  Sodium 141 potassium 4.1 BUN 20 creatinine 0.5.  Hemoglobin 8.2 WBC 7.6 platelets 335.  I do note hemoglobin 7.9 on July 26 12/09/2014.  Sodium 139  potassium 3.9 BUN 22 creatinine 0.6.  WBC 7.7 hemoglobin 8.3 platelets 394.  Ferritin 205-folate 7.2-iron 21-TIBC 160.  Vitamin B12 384.    CBC  Component Value Date/Time   WBC 11.4* 11/05/2014 0225   RBC 3.23* 11/05/2014 0225   HGB 8.8* 11/05/2014 0225   HCT 28.0* 11/05/2014 0225   PLT 391 11/05/2014 0225   MCV 86.7 11/05/2014 0225   LYMPHSABS 1.1 11/01/2014 1050   MONOABS 1.1* 11/01/2014 1050   EOSABS 0.0 11/01/2014 1050   BASOSABS 0.0 11/01/2014 1050    CMP     Component Value Date/Time   NA 145 11/05/2014 0225   K 3.0* 11/05/2014 0225   CL 110 11/05/2014 0225   CO2 28 11/05/2014 0225   GLUCOSE 96 11/05/2014 0225   BUN 19 11/05/2014 0225   CREATININE 0.67 11/05/2014 0225   CALCIUM 7.7* 11/05/2014 0225   PROT 4.8* 11/01/2014 1050   ALBUMIN 1.3* 11/01/2014 1050   AST 45* 11/01/2014 1050   ALT 24 11/01/2014 1050   ALKPHOS 108 11/01/2014 1050   BILITOT 0.6 11/01/2014 1050   GFRNONAA >60 11/05/2014 0225   GFRAA >60 11/05/2014 0225    Assessment and Plan  #1 history of failure to thrive with dementia-this actually appears to continue to improve- she is eating and drinking better appears more alert and interactive --fewer periods of agitation at this point continue current medications including Namenda X are-Lexapro for concurrent depression-and Remeron for depression/appetite stimulation.  She continues with Ativan as needed apparently is not needing this nearly as often.  #2 anemia with an element of chronic disease and iron deficiency hemoglobin is trending up she is on iron-will update a CBC hemoglobin improved at 8.4 on most recent lab.  #3 hypertension this appears stable on Norvasc as well as atenolol.   #4 history of osteoarthritis with history of right humeral fracture pain appears to be controlled with the tramadol she does have OxyIR for breakthrough pain she is also receiving Biofreeze has limited motion of her right shoulder which I suspect we will  be chronic to some extent she is followed by orthopedics.  #5 history of hyperlipidemia she is [place] as well as atorvastatin--LDL 76- total cholesterol 454 on 03/08/2015 this appears to be stable   #6-history of respiratory failure she is on Pulmicort routinely her Estrace status has been stable now for some time.  She also has albuterol nebulizers when necessary.   #7 History of CVA she continues on aspirin--has been seen by neurology and has some Parkinsonian  traits but this appears to be stable but could not really appreciate focal weakness.--She continues on a statin as well  #8 History allergic rhinitis this is been stable for some time she continues on Allegra routinely. #9 Apparently history of electrolyte abnormality she is on potassium supplementation Will update a metabolic panel.  Also will update liver function tests . #10 History of GERD symptoms--stomach discomfort- and she continues on Protonix twice a day--this appears to be stable again she is eating better  [id number]-of note greater than 40 minutes spent assessing patient-discussing her status with nursing staff-reviewing her chart-and coordinating and formulating a plan of care for numerous diagnoses-of note greater than 50% of time spent coordinating plan of care              .   d      Abagael Kramm C,

## 2015-04-16 LAB — BASIC METABOLIC PANEL
BUN: 22 mg/dL — AB (ref 4–21)
Creatinine: 0.6 mg/dL (ref 0.5–1.1)
Glucose: 81 mg/dL
POTASSIUM: 4.1 mmol/L (ref 3.4–5.3)
SODIUM: 136 mmol/L — AB (ref 137–147)

## 2015-04-16 LAB — HEPATIC FUNCTION PANEL
ALK PHOS: 67 U/L (ref 25–125)
ALT: 19 U/L (ref 7–35)
AST: 24 U/L (ref 13–35)
BILIRUBIN, TOTAL: 0.3 mg/dL

## 2015-04-16 LAB — CBC AND DIFFERENTIAL
HEMATOCRIT: 32 % — AB (ref 36–46)
HEMOGLOBIN: 10.3 g/dL — AB (ref 12.0–16.0)
Platelets: 174 10*3/uL (ref 150–399)
WBC: 5.5 10^3/mL

## 2015-05-20 ENCOUNTER — Other Ambulatory Visit: Payer: Self-pay | Admitting: *Deleted

## 2015-05-20 MED ORDER — OXYCODONE HCL 5 MG PO TABS: 5.0000 mg | ORAL_TABLET | Freq: Four times a day (QID) | ORAL | Status: DC | PRN

## 2015-05-21 ENCOUNTER — Other Ambulatory Visit: Payer: Self-pay | Admitting: *Deleted

## 2015-05-21 MED ORDER — TRAMADOL HCL 50 MG PO TABS: 50.0000 mg | ORAL_TABLET | Freq: Four times a day (QID) | ORAL | Status: DC | PRN

## 2015-06-16 ENCOUNTER — Encounter: Payer: Self-pay | Admitting: Internal Medicine

## 2015-06-16 ENCOUNTER — Non-Acute Institutional Stay (SKILLED_NURSING_FACILITY): Payer: Medicare Other | Admitting: Internal Medicine

## 2015-06-16 DIAGNOSIS — I10 Essential (primary) hypertension: Secondary | ICD-10-CM | POA: Diagnosis not present

## 2015-06-16 DIAGNOSIS — J449 Chronic obstructive pulmonary disease, unspecified: Secondary | ICD-10-CM

## 2015-06-16 DIAGNOSIS — L989 Disorder of the skin and subcutaneous tissue, unspecified: Secondary | ICD-10-CM | POA: Diagnosis not present

## 2015-06-16 NOTE — Progress Notes (Signed)
MRN: [id number] Name: Beverly Black  Sex: female Age: 82 y.o. DOB: 1928-09-29  [facility] #: [name] Facility/Room: Level Of Care: SNF Provider: Merrilee Seashore D Emergency Contacts: Extended Emergency Contact Information Primary Emergency Contact: [name],[name] Address: [address]          [name] [address] [place] Home Phone: [phone] Relation: Daughter Secondary Emergency Contact: [name] [place] Home Phone: [phone] Relation: Daughter  Code Status:   Allergies: Sulfa antibiotics  Chief Complaint  Patient presents with  . Acute Visit    HPI: Patient is 82 y.o. female who is being seen for routine issues of COPD and HTN and also for an acute visit. Nursing asked me to look at a place under L eye. Denies pain, no drainage, not associated with any other sx.   Past Medical History  Diagnosis Date  . Hypertension   . Hyperlipidemia   . Stroke (HCC) 10/2008  . Aneurysm (HCC)     of the eye  . Osteoporosis     Past Surgical History  Procedure Laterality Date  . Eye surgery    . Partial hysterectomy  1968      Medication List       This list is accurate as of: 06/16/15 11:59 PM.  Always use your most recent med list.               amLODipine 10 MG tablet  Commonly known as:  NORVASC  TAKE 1 TABLET AT BEDTIME     aspirin 81 MG tablet  Take 81 mg by mouth daily.     atenolol 50 MG tablet  Commonly known as:  TENORMIN  TAKE 1 TABLET TWICE A DAY     atorvastatin 40 MG tablet  Commonly known as:  LIPITOR  TAKE 1 TABLET DAILY     BIOFREEZE 4 % Gel  Generic drug:  Menthol (Topical Analgesic)  Apply 1 application topically 2 (two) times daily.     budesonide 0.25 MG/2ML nebulizer solution  Commonly known as:  PULMICORT  Take 2 mLs (0.25 mg total) by nebulization 2 (two) times daily.     cholecalciferol 1000 units tablet  Commonly known as:  VITAMIN D  Take 1,000 Units by mouth daily.     docusate sodium 100 MG capsule  Commonly known as:  COLACE  Take 100 mg by mouth 2 (two) times daily.     ENSURE  Take 237 mLs by mouth daily. At 2 pm     feeding supplement (ENSURE ENLIVE) Liqd  Take 237 mLs by mouth 3 (three) times daily between meals.     escitalopram 10 MG tablet  Commonly known as:  LEXAPRO  Take 10 mg by mouth daily.     ferrous fumarate 325 (106 Fe) MG Tabs tablet  Commonly known as:  HEMOCYTE - 106 mg FE  Take 1 tablet by mouth daily.     fexofenadine 180 MG tablet  Commonly known as:  ALLEGRA  Take 180 mg by mouth daily.     ipratropium-albuterol 0.5-2.5 (3) MG/3ML Soln  Commonly known as:  DUONEB  Take 3 mLs by nebulization every 4 (four) hours as needed (SOB/wheezing).     LORazepam 0.5 MG tablet  Commonly known as:  ATIVAN  Take 1 tablet (0.5 mg total) by mouth every 8 (eight) hours.     mirtazapine 30 MG tablet  Commonly known as:  REMERON  TAKE 1 TABLET AT BEDTIME  MULTIVITAMIN ADULT PO  Take 1 tablet by mouth daily.     NAMENDA XR 14 MG Cp24 24 hr capsule  Generic drug:  memantine  Take 14 mg by mouth daily.     oxyCODONE 5 MG immediate release tablet  Commonly known as:  Oxy IR/ROXICODONE  Take 1 tablet (5 mg total) by mouth every 6 (six) hours as needed for severe pain.     pantoprazole 40 MG tablet  Commonly known as:  PROTONIX  Take 1 tablet (40 mg total) by mouth 2 (two) times daily.     polyethylene glycol packet  Commonly known as:  MIRALAX / GLYCOLAX  Take 17 g by mouth daily. To be hold for diarrhea     Potassium Chloride ER 20 MEQ Tbcr  Take 1 tablet by mouth daily.     PREMARIN 0.3 MG tablet  Generic drug:  estrogens (conjugated)  TAKE 1 TABLET DAILY FOR 21 DAYS, THEN DO NOT TAKE FOR 7 DAYS     PRESCRIPTION MEDICATION  Pt to take 1 two times a day to promote weight gain. MAGIC CUP     traMADol 50 MG tablet  Commonly known as:  ULTRAM  Take 1 tablet (50 mg total) by mouth every 6 (six) hours as needed for  moderate pain.     ZETIA 10 MG tablet  Generic drug:  ezetimibe  TAKE 1 TABLET DAILY        No orders of the defined types were placed in this encounter.    Immunization History  Administered Date(s) Administered  . Influenza,inj,Quad PF,36+ Mos 04/16/2013, 05/06/2014    Social History  Substance Use Topics  . Smoking status: Current Every Day Smoker -- 0.50 packs/day for 50 years  . Smokeless tobacco: Not on file  . Alcohol Use: No    Review of Systems  DATA OBTAINED: from nurse GENERAL:  no fevers, fatigue, appetite changes SKIN: as per HPI HEENT: No complaint RESPIRATORY: No cough, wheezing, SOB CARDIAC: No chest pain, palpitations, lower extremity edema  GI: No abdominal pain, No N/V/D or constipation, No heartburn or reflux  GU: No dysuria, frequency or urgency, or incontinence  MUSCULOSKELETAL: No unrelieved bone/joint pain NEUROLOGIC: No headache, dizziness  PSYCHIATRIC: No overt anxiety or sadness  Filed Vitals:   06/16/15 1222  BP: 136/64  Pulse: 63  Temp: 97 F (36.1 C)  Resp: 18    Physical Exam  GENERAL APPEARANCE: Alert, min conversant, No acute distress  SKIN: small ,aybe 3 mm slt scaley papule below L eye; minimal redness HEENT: Unremarkable RESPIRATORY: Breathing is even, unlabored. Lung sounds are clear   CARDIOVASCULAR: Heart RRR no murmurs, rubs or gallops. No peripheral edema  GASTROINTESTINAL: Abdomen is soft, non-tender, not distended w/ normal bowel sounds.  GENITOURINARY: Bladder non tender, not distended  MUSCULOSKELETAL: No abnormal joints or musculature NEUROLOGIC: Cranial nerves 2-12 grossly intact. Moves all extremities PSYCHIATRIC: Mood and affect appropriate with dementia, no behavioral issues  Patient Active Problem List   Diagnosis Date Noted  . COPD (chronic obstructive pulmonary disease) (HCC) 06/25/2015  . Loss of weight 01/21/2015  . Anemia 01/05/2015  . Acute upper respiratory infection 01/05/2015  . Dementia with  behavioral disturbance 12/31/2014  . Iron deficiency anemia 12/10/2014  . Stomach discomfort 11/30/2014  . Subacute confusional state 11/30/2014  . Acute respiratory failure with hypoxia (HCC) 11/11/2014  . AKI (acute kidney injury) (HCC) 11/11/2014  . Hypernatremia 11/11/2014  . Hypokalemia 11/09/2014  . COPD exacerbation (HCC)   .  Acute on chronic diastolic heart failure (HCC)   . Hypoxia   . SOB (shortness of breath)   . Pressure ulcer stage II 11/02/2014  . Palliative care encounter 11/02/2014  . HCAP (healthcare-associated pneumonia) 11/01/2014  . Leukocytosis 10/15/2014  . Closed fracture of part of upper end of humerus 10/14/2014  . Poor balance 11/25/2013  . History of stroke 11/25/2013  . 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 Myalgia 09/05/2012  . Disequilibrium 09/05/2012  . Hypertension, essential 09/05/2012  . 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 Mobility impaired 07/15/2012  . Physical exam, routine 12/07/2011  . Cerumen impaction 04/04/2011  . Trapezius muscle spasm 04/04/2011  . HTN (hypertension) 11/30/2010  . Hyperlipidemia 11/30/2010  . CVA (cerebral infarction) 11/30/2010  . Postmenopausal HRT (hormone replacement therapy) 11/30/2010  . Anxiety 11/30/2010  . Ankle weakness 11/30/2010    CBC    Component Value Date/Time   WBC 11.4* 11/05/2014 0225   RBC 3.23* 11/05/2014 0225   HGB 8.8* 11/05/2014 0225   HCT 28.0* 11/05/2014 0225   PLT 391 11/05/2014 0225   MCV 86.7 11/05/2014 0225   LYMPHSABS 1.1 11/01/2014 1050   MONOABS 1.1* 11/01/2014 1050   EOSABS 0.0 11/01/2014 1050   BASOSABS 0.0 11/01/2014 1050    CMP     Component Value Date/Time   NA 145 11/05/2014 0225   K 3.0* 11/05/2014 0225   CL 110 11/05/2014 0225   CO2 28 11/05/2014 0225   GLUCOSE 96 11/05/2014 0225   BUN 19 11/05/2014 0225   CREATININE 0.67 11/05/2014 0225   CALCIUM 7.7* 11/05/2014 0225   PROT 4.8* 11/01/2014 1050   ALBUMIN 1.3* 11/01/2014 1050   AST 45*  11/01/2014 1050   ALT 24 11/01/2014 1050   ALKPHOS 108 11/01/2014 1050   BILITOT 0.6 11/01/2014 1050   GFRNONAA >60 11/05/2014 0225   GFRAA >60 11/05/2014 0225    Assessment and Plan  SKIN LESION OF FACE -  Non specific, no cellulitis, warm compresses and onservation  Hypertension, essential Controlled on tenormin and norvasc; plan - cont current regimen  COPD exacerbation Stable on pulmicort and prn atrovent;plan - cont current regimen    [name], MD

## 2015-06-25 DIAGNOSIS — J449 Chronic obstructive pulmonary disease, unspecified: Secondary | ICD-10-CM | POA: Insufficient documentation

## 2015-06-25 NOTE — Assessment & Plan Note (Signed)
Controlled on tenormin and norvasc; plan - cont current regimen

## 2015-06-25 NOTE — Assessment & Plan Note (Signed)
Stable on pulmicort and prn atrovent;plan - cont current regimen

## 2015-07-28 ENCOUNTER — Non-Acute Institutional Stay (SKILLED_NURSING_FACILITY): Payer: Medicare Other | Admitting: Internal Medicine

## 2015-07-28 ENCOUNTER — Encounter: Payer: Self-pay | Admitting: Internal Medicine

## 2015-07-28 DIAGNOSIS — D509 Iron deficiency anemia, unspecified: Secondary | ICD-10-CM | POA: Diagnosis not present

## 2015-07-28 DIAGNOSIS — I10 Essential (primary) hypertension: Secondary | ICD-10-CM

## 2015-07-28 DIAGNOSIS — F0391 Unspecified dementia with behavioral disturbance: Secondary | ICD-10-CM | POA: Diagnosis not present

## 2015-07-28 DIAGNOSIS — S42201D Unspecified fracture of upper end of right humerus, subsequent encounter for fracture with routine healing: Secondary | ICD-10-CM | POA: Diagnosis not present

## 2015-07-28 DIAGNOSIS — F03918 Unspecified dementia, unspecified severity, with other behavioral disturbance: Secondary | ICD-10-CM

## 2015-07-28 DIAGNOSIS — J42 Unspecified chronic bronchitis: Secondary | ICD-10-CM | POA: Diagnosis not present

## 2015-07-28 NOTE — Progress Notes (Signed)
Patient ID: Beverly Black, female   DOB: 02/28/1929, 82 y.o.   MRN: [id number]    MRN: [id number] Name: Beverly Black  Sex: female Age: 82 y.o. DOB: 01/31/1929  [facility] Facility/Room:107 Level Of Care: SNF Provider: Edmon CrapeLassen, Arlo Emergency Contacts: Extended Emergency Contact Information Primary Emergency Contact: [name],[name] Address: [address]          [place] [id number] [place] Home Phone: [phone] Relation: Daughter Secondary Emergency Contact: [name],[name]  [place] Home Phone: [phone] Relation: Daughter  Code Status:   Allergies: Sulfa antibiotics  Chief Complaint  Patient presents with  . Acute Visit    Conjunctivitis    including right humerus fracture anemia dementia failure to thrive hypertension osteoarthritis--history of pneumonia and respiratory failure  HPI: Patient is 82 y.o. female a complicated history including a right humerus fracture-history of pneumonia and respiratory failure-general weakness--FTT.  Patient is followed by orthopedics for her fracture apparently this is stable .     Also has a history of anemia thought to have an element of chronic disease she has been started on iron - h This appears to be helping her hemoglobin is slowly rising most recently 8.4 on 02/19/2015.  Occult blood testing was negative.  She had a period of significant failure to thrive but this has turned around fairly significantly-she is eating and drinking better she is more alert and interactive.  She does have a history of dementia with behaviors but this has been significantly improve recently as well she does continue on Ativan as needed.  She is on Namenda X R.  In regards to appetite stimulation she is on Remeron in this appears to be helping    He does have a history of significant osteoarthritis complicated with her humerus fracture which appears to be stable at this time she is receiving  Biofreeze as well as tramadol and has OxyIR if needed.    Regards hypertension this appears stable with recent blood pressures 130/70-130/60 she is on Norvasc as well as atenolol.  Respiratory status has not been an issue recently as well she is on Pulmicort twice a day as well as albuterol nebulizers as needed      Past Medical History  Diagnosis Date  . Hypertension   . Hyperlipidemia   . Stroke (HCC) 10/2008  . Aneurysm (HCC)     of the eye  . Osteoporosis     Past Surgical History  Procedure Laterality Date  . Eye surgery    . Partial hysterectomy  1968      Medication List       This list is accurate as of: 07/28/15  3:01 PM.  Always use your most recent med list.               amLODipine 10 MG tablet  Commonly known as:  NORVASC  TAKE 1 TABLET AT BEDTIME     aspirin 81 MG tablet  Take 81 mg by mouth daily.     atenolol 50 MG tablet  Commonly known as:  TENORMIN  TAKE 1 TABLET TWICE A DAY     atorvastatin 40 MG tablet  Commonly known as:  LIPITOR  TAKE 1 TABLET DAILY     BIOFREEZE 4 % Gel  Generic drug:  Menthol (Topical Analgesic)  Apply 1 application topically 2 (two) times daily.     budesonide 0.25 MG/2ML nebulizer solution  Commonly known as:  PULMICORT  Take 2 mLs (  0.25 mg total) by nebulization 2 (two) times daily.     cholecalciferol 1000 units tablet  Commonly known as:  VITAMIN D  Take 1,000 Units by mouth daily.     docusate sodium 100 MG capsule  Commonly known as:  COLACE  Take 100 mg by mouth 2 (two) times daily.     escitalopram 10 MG tablet  Commonly known as:  LEXAPRO  Take 10 mg by mouth daily.     feeding supplement (PRO-STAT SUGAR FREE 64) Liqd  Take 30 mLs by mouth 2 (two) times daily.     ferrous fumarate 325 (106 Fe) MG Tabs tablet  Commonly known as:  HEMOCYTE - 106 mg FE  Take 1 tablet by mouth daily.     fexofenadine 180 MG tablet  Commonly known as:  ALLEGRA  Take 180 mg by mouth daily.      ipratropium-albuterol 0.5-2.5 (3) MG/3ML Soln  Commonly known as:  DUONEB  Take 3 mLs by nebulization every 4 (four) hours as needed (SOB/wheezing).     LORazepam 0.5 MG tablet  Commonly known as:  ATIVAN  Take 1 tablet (0.5 mg total) by mouth every 8 (eight) hours.     mirtazapine 15 MG tablet  Commonly known as:  REMERON  Take 15 mg by mouth at bedtime.     MULTIVITAMIN ADULT PO  Take 1 tablet by mouth daily.     NAMENDA XR 28 MG Cp24 24 hr capsule  Generic drug:  memantine  Take 28 mg by mouth daily.     oxyCODONE 5 MG immediate release tablet  Commonly known as:  Oxy IR/ROXICODONE  Take 1 tablet (5 mg total) by mouth every 6 (six) hours as needed for severe pain.     pantoprazole 40 MG tablet  Commonly known as:  PROTONIX  Take 1 tablet (40 mg total) by mouth 2 (two) times daily.     polyethylene glycol packet  Commonly known as:  MIRALAX / GLYCOLAX  Take 17 g by mouth daily. To be hold for diarrhea     Potassium Chloride ER 20 MEQ Tbcr  Take 1 tablet by mouth daily.     PRESCRIPTION MEDICATION  Pt to take 1 two times a day to promote weight gain. MAGIC CUP     senna 8.6 MG tablet  Commonly known as:  SENOKOT  Take 1 tablet by mouth 2 (two) times daily.     traMADol 50 MG tablet  Commonly known as:  ULTRAM  Take 1 tablet (50 mg total) by mouth every 6 (six) hours as needed for moderate pain.     vitamin C 500 MG tablet  Commonly known as:  ASCORBIC ACID  Take 500 mg by mouth 2 (two) times daily.     ZETIA 10 MG tablet  Generic drug:  ezetimibe  TAKE 1 TABLET DAILY          Immunization History  Administered Date(s) Administered  . Influenza,inj,Quad PF,36+ Mos 04/16/2013, 05/06/2014  . Influenza-Unspecified 02/03/2015    Social History  Substance Use Topics  . Smoking status: Current Every Day Smoker -- 0.50 packs/day for 50 years  . Smokeless tobacco: Not on file  . Alcohol Use: No    Review of Systems  DATA OBTAINED: from patient, nurse  limited secondary to dementia,  GENERAL:  no fevers, fatigue, continues to eat better SKIN: No itching, rash HEENT: No complaint RESPIRATORY: No cough, wheezing, SOB CARDIAC: No chest pain, palpitations, lower extremity edema  GI: No abdominal pain, No N/V/D or constipation, No heartburn or reflux  GU: No dysuria, frequency or urgency, or incontinence  MUSCULOSKELETAL:  history of right humerus fracture this appears to be relatively stable pain appears to be controlled  NEUROLOGIC: No headache, dizziness  PSYCHIATRIC:has less anxiety-does have dementia but this appears to be more stable with fewer behaviors    Physical Exam  T- 98.6 pulse 64 respirations 18 blood pressure 130/70  GENERAL APPEARANCE: Alert, conversant,Thin WF No acute distress sitting comfortably in her wheelchair this afternoon--she is stronger more interactive r SKIN: No diaphoresis rash;  HEENT: Unremarkable this membranes appear fairly mois--visual acuity appears grossly intact she has prescription lenses t RESPIRATORY: Breathing is even, unlabored. Lung sounds are clear   CARDIOVASCULAR: Heart RRR with distant heart sounds no murmurs, rubs or gallops. No peripheral edema  GASTROINTESTINAL: Abdomen is soft, non-tender, somewhat protuberant which is baseline w/ normal bowel sounds.   MUSCULOSKELETAL: No abnormal joints or musculature--again does have the right humerus fracture with limited mobility of her right arm and shoulder which is no-t new--appears to have less pain here t D NEUROLOGIC: Cranial nerves 2-12 grossly intact. Moves all extremities PSYCHIATRIC: Mood and affect appropriate to situation, she is pleasant although somewhat confused does answer questions Speaks a bit more than when I saw her previously-her periods of agitation apparently have significantly improved   Patient Active Problem List   Diagnosis Date Noted  . COPD (chronic obstructive pulmonary disease) (HCC) 06/25/2015  . Loss of weight  01/21/2015  . Anemia 01/05/2015  . Acute upper respiratory infection 01/05/2015  . Dementia with behavioral disturbance 12/31/2014  . Iron deficiency anemia 12/10/2014  . Stomach discomfort 11/30/2014  . Subacute confusional state 11/30/2014  . Acute respiratory failure with hypoxia (HCC) 11/11/2014  . AKI (acute kidney injury) (HCC) 11/11/2014  . Hypernatremia 11/11/2014  . Hypokalemia 11/09/2014  . COPD exacerbation (HCC)   . Acute on chronic diastolic heart failure (HCC)   . Hypoxia   . SOB (shortness of breath)   . Pressure ulcer stage II 11/02/2014  . Palliative care encounter 11/02/2014  . HCAP (healthcare-associated pneumonia) 11/01/2014  . Leukocytosis 10/15/2014  . Closed fracture of part of upper end of humerus 10/14/2014  . Poor balance 11/25/2013  . History of stroke 11/25/2013  . 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 Myalgia 09/05/2012  . Disequilibrium 09/05/2012  . Hypertension, essential 09/05/2012  . 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 Mobility impaired 07/15/2012  . Physical exam, routine 12/07/2011  . Cerumen impaction 04/04/2011  . Trapezius muscle spasm 04/04/2011  . HTN (hypertension) 11/30/2010  . Hyperlipidemia 11/30/2010  . CVA (cerebral infarction) 11/30/2010  . Postmenopausal HRT (hormone replacement therapy) 11/30/2010  . Anxiety 11/30/2010  . Ankle weakness 11/30/2010   Labs  03/08/2015.  HDL 31--LDL 76--cholesterol 125--triglycerides 89.  02/19/2015.  WBC 6.4 hemoglobin 8.4 platelets 197.  Sodium 138 potassium 3.8 BUN 24 creatinine 0.6.  01/19/2015.  Albumin 2.3 otherwise liver function tests within normal limits.  01/19/2015.  Sodium 134 potassium 3.8 BUN 13 creatinine 0.5  Albumin 2.3.  01/15/2015.  WBC 6.0 hemoglobin 7.2 platelets 277  01/05/2015.  Sodium 140 potassium 4.3 BUN 31 creatinine 0.7.   WBC is 7.0 hemoglobin 7.2 platelets 357.  MCV 80.4.    12/14/2014.  Sodium 141 potassium 4.1 BUN 20  creatinine 0.5.  Hemoglobin 8.2 WBC 7.6 platelets 335.  I do note hemoglobin 7.9 on July 26 12/09/2014.  Sodium 139 potassium 3.9 BUN 22 creatinine 0.6.  WBC 7.7 hemoglobin 8.3 platelets 394.  Ferritin 205-folate 7.2-iron 21-TIBC 160.  Vitamin B12 384.    CBC    Component Value Date/Time   WBC 5.5 04/16/2015   WBC 11.4* 11/05/2014 0225   RBC 3.23* 11/05/2014 0225   HGB 10.3* 04/16/2015   HCT 32* 04/16/2015   PLT 174 04/16/2015   MCV 86.7 11/05/2014 0225   LYMPHSABS 1.1 11/01/2014 1050   MONOABS 1.1* 11/01/2014 1050   EOSABS 0.0 11/01/2014 1050   BASOSABS 0.0 11/01/2014 1050    CMP     Component Value Date/Time   NA 136* 04/16/2015   NA 145 11/05/2014 0225   K 4.1 04/16/2015   CL 110 11/05/2014 0225   CO2 28 11/05/2014 0225   GLUCOSE 96 11/05/2014 0225   BUN 22* 04/16/2015   BUN 19 11/05/2014 0225   CREATININE 0.6 04/16/2015   CREATININE 0.67 11/05/2014 0225   CALCIUM 7.7* 11/05/2014 0225   PROT 4.8* 11/01/2014 1050   ALBUMIN 1.3* 11/01/2014 1050   AST 24 04/16/2015   ALT 19 04/16/2015   ALKPHOS 67 04/16/2015   BILITOT 0.6 11/01/2014 1050   GFRNONAA >60 11/05/2014 0225   GFRAA >60 11/05/2014 0225    Assessment and Plan  #1 history of failure to thrive with dementia-this actually appears to continue to improve- she is eating and drinking better appears more alert and interactive --fewer periods of agitation at this point continue current medications including Namenda X are-Lexapro for concurrent depression-and Remeron for depression/appetite stimulation.  She continues with Ativan as needed apparently is not needing this nearly as often.  #2 anemia with an element of chronic disease and iron deficiency hemoglobin is trending up she is on iron-will update a CBC hemoglobin improved at 8.4 on most recent lab.  #3 hypertension this appears stable on Norvasc as well as atenolol.   #4 history of osteoarthritis with history of right humeral fracture pain  appears to be controlled with the tramadol she does have OxyIR for breakthrough pain she is also receiving Biofreeze has limited motion of her right shoulder which I suspect we will be chronic to some extent she is followed by orthopedics.  #5 history of hyperlipidemia she is [place] as well as atorvastatin--LDL 76- total cholesterol 811 on 03/08/2015 this appears to be stable   #6-history of respiratory failure she is on Pulmicort routinely her Estrace status has been stable now for some time.  She also has albuterol nebulizers when necessary.   #7 History of CVA she continues on aspirin--has been seen by neurology and has some Parkinsonian  traits but this appears to be stable but could not really appreciate focal weakness.--She continues on a statin as well  #8 History allergic rhinitis this is been stable for some time she continues on Allegra routinely. #9 Apparently history of electrolyte abnormality she is on potassium supplementation Will update a metabolic panel.  Also will update liver function tests . #10 History of GERD symptoms--stomach discomfort- and she continues on Protonix twice a day--this appears to be stable again she is eating better  [id number]-of note greater than 40 minutes spent assessing patient-discussing her status with nursing staff-reviewing her chart-and coordinating and formulating a plan of care for numerous diagnoses-of note greater than 50% of time spent coordinating plan of care              .   d      Edmon Crape,

## 2015-08-01 NOTE — Progress Notes (Signed)
Patient ID: Beverly Black, female   DOB: 10/20/1928, 82 y.o.   MRN: [id number]     MRN: [id number] Name: Beverly Black  Sex: female Age: 82 y.o. DOB: 01/16/1929  [facility]: [facility] Facility/Room:107 Level Of Care: SNF Provider: Roena MaladyLASSEN, Taquan Bralley C Emergency Contacts: Extended Emergency Contact Information Primary Emergency Contact: Beverly Black Address: [address]          [place] [id number] [place] Home Phone: [phone] Relation: Daughter Secondary Emergency Contact: Beverly Black,[name]  [place] Home Phone: [phone] Relation: Daughter  Code Status:   Allergies: Sulfa antibiotics This is a routine visit.  Chief complaint-visit for medical management of chronic medical issues   including right humerus fracture anemia dementia failure to thrive hypertension osteoarthritis--history of pneumonia and respiratory failure  HPI: Patient is 82 y.o. female a complicated history including a right humerus fracture-history of pneumonia and respiratory failure-general weakness--FTT.  Patient is followed by orthopedics for her fracture apparently this is stable .     Also has a history of anemia thought to have an element of chronic disease she has been started on iron - h This appears to be helping her hemoglobin is slowly rising most recently 10.3 on lab done back in January  Occult blood testing was negative.  She had a period of significant failure to thrive but this has turned around fairly significantly-she is eating and drinking better shecontinues to be alert and interactive.  She does have a history of dementia with behaviors but this has been significantly improve recently as well she does continue on Ativan as needed.  She is on Namenda X R.  In regards to appetite stimulation she is on Remeron in this appears to be helping    Hdoes have a history of significant osteoarthritis complicated with her humerus fracture which appears  to be stable at this time she has tramadol and has OxyIR if needed.    Regards hypertension this appears stable with recent blood pressures 128/67-141/70 when I do not see consistent elevations she is on Norvasc as well as atenolol.  Respiratory status has not been an issue recently as well she is on Pulmicort twice a day as well as albuterol nebulizers as needed  Nursing staff has noted an erythematous right conjunctiva has been no history of trauma she does not complain of any irritation itching or discomfort      Past Medical History  Diagnosis Date  . Hypertension   . Hyperlipidemia   . Stroke (HCC) 10/2008  . Aneurysm (HCC)     of the eye  . Osteoporosis     Past Surgical History  Procedure Laterality Date  . Eye surgery    . Partial hysterectomy  1968      Medication List       This list is accurate as of: 07/28/15 11:59 PM.  Always use your most recent med list.               amLODipine 10 MG tablet  Commonly known as:  NORVASC  TAKE 1 TABLET AT BEDTIME     aspirin 81 MG tablet  Take 81 mg by mouth daily.     atenolol 50 MG tablet  Commonly known as:  TENORMIN  TAKE 1 TABLET TWICE A DAY     atorvastatin 40 MG tablet  Commonly known as:  LIPITOR  TAKE 1 TABLET DAILY     BIOFREEZE 4 % Gel  Generic drug:  Menthol (  Topical Analgesic)  Apply 1 application topically 2 (two) times daily.     budesonide 0.25 MG/2ML nebulizer solution  Commonly known as:  PULMICORT  Take 2 mLs (0.25 mg total) by nebulization 2 (two) times daily.     cholecalciferol 1000 units tablet  Commonly known as:  VITAMIN D  Take 1,000 Units by mouth daily.     docusate sodium 100 MG capsule  Commonly known as:  COLACE  Take 100 mg by mouth 2 (two) times daily.     escitalopram 10 MG tablet  Commonly known as:  LEXAPRO  Take 10 mg by mouth daily.     feeding supplement (PRO-STAT SUGAR FREE 64) Liqd  Take 30 mLs by mouth 2 (two) times daily.     ferrous fumarate 325 (106  Fe) MG Tabs tablet  Commonly known as:  HEMOCYTE - 106 mg FE  Take 1 tablet by mouth daily.     fexofenadine 180 MG tablet  Commonly known as:  ALLEGRA  Take 180 mg by mouth daily.     ipratropium-albuterol 0.5-2.5 (3) MG/3ML Soln  Commonly known as:  DUONEB  Take 3 mLs by nebulization every 4 (four) hours as needed (SOB/wheezing).     LORazepam 0.5 MG tablet  Commonly known as:  ATIVAN  Take 1 tablet (0.5 mg total) by mouth every 8 (eight) hours.     mirtazapine 15 MG tablet  Commonly known as:  REMERON  Take 15 mg by mouth at bedtime.     MULTIVITAMIN ADULT PO  Take 1 tablet by mouth daily.     NAMENDA XR 28 MG Cp24 24 hr capsule  Generic drug:  memantine  Take 28 mg by mouth daily.     oxyCODONE 5 MG immediate release tablet  Commonly known as:  Oxy IR/ROXICODONE  Take 1 tablet (5 mg total) by mouth every 6 (six) hours as needed for severe pain.     pantoprazole 40 MG tablet  Commonly known as:  PROTONIX  Take 1 tablet (40 mg total) by mouth 2 (two) times daily.     polyethylene glycol packet  Commonly known as:  MIRALAX / GLYCOLAX  Take 17 g by mouth daily. To be hold for diarrhea     Potassium Chloride ER 20 MEQ Tbcr  Take 1 tablet by mouth daily.     PRESCRIPTION MEDICATION  Pt to take 1 two times a day to promote weight gain. MAGIC CUP     senna 8.6 MG tablet  Commonly known as:  SENOKOT  Take 1 tablet by mouth 2 (two) times daily.     traMADol 50 MG tablet  Commonly known as:  ULTRAM  Take 1 tablet (50 mg total) by mouth every 6 (six) hours as needed for moderate pain.     vitamin C 500 MG tablet  Commonly known as:  ASCORBIC ACID  Take 500 mg by mouth 2 (two) times daily.     ZETIA 10 MG tablet  Generic drug:  ezetimibe  TAKE 1 TABLET DAILY          Immunization History  Administered Date(s) Administered  . Influenza,inj,Quad PF,36+ Mos 04/16/2013, 05/06/2014  . Influenza-Unspecified 02/03/2015    Social History  Substance Use Topics   . Smoking status: Current Every Day Smoker -- 0.50 packs/day for 50 years  . Smokeless tobacco: Not on file  . Alcohol Use: No    Review of Systems  DATA OBTAINED: from patient, nurse limited secondary to dementia,  GENERAL:  no fevers, fatigue, continues to eat well SKIN: No itching, rash HEENT: No complaint--but right eye is erythematous RESPIRATORY: No cough, wheezing, SOB CARDIAC: No chest pain, palpitations, lower extremity edema  GI: No abdominal pain, No N/V/D or constipation, No heartburn or reflux  GU: No dysuria, frequency or urgency, or incontinence  MUSCULOSKELETAL:  history of right humerus fracture this appears to be relatively stable pain appears to be controlled  NEUROLOGIC: No headache, dizziness  PSYCHIATRIC:has less anxiety-does have dementia but this appears to be more stable with fewer behaviors    Physical Exam  Temperature 98.0 pulse 64 respirations 18 blood pressure 128/67  GENERAL APPEARANCE: Alert, conversant,Thin WF No acute distress sitting comfortably in her wheelchair this afternoon--she is stronger more interactive r SKIN: No diaphoresis rash;  HEENT: Oropharynx is Unremarkable membranes appear fairly mois--visual acuity appears grossly intact she has prescription lenses t--right eye conjunctiva is erythematous and do not see any surrounding orbital erythema edema or crusting-pupil is reactive to light RESPIRATORY: Breathing is even, unlabored. Lung sounds are clear   CARDIOVASCULAR: Heart RRR with distant heart sounds no murmurs, rubs or gallops. No peripheral edema  GASTROINTESTINAL: Abdomen is soft, non-tender, somewhat protuberant which is baseline w/ normal bowel sounds.   MUSCULOSKELETAL: No abnormal joints or musculature--again does have the right humerus fracture with limited mobility of her right arm and shoulder which is no-t new D NEUROLOGIC: Cranial nerves 2-12 grossly intact. Moves all extremities PSYCHIATRIC: Mood and affect appropriate  to situation, she is pleasant although somewhat confused does answer questions    Patient Active Problem List   Diagnosis Date Noted  . COPD (chronic obstructive pulmonary disease) (HCC) 06/25/2015  . Loss of weight 01/21/2015  . Anemia 01/05/2015  . Acute upper respiratory infection 01/05/2015  . Dementia with behavioral disturbance 12/31/2014  . Iron deficiency anemia 12/10/2014  . Stomach discomfort 11/30/2014  . Subacute confusional state 11/30/2014  . Acute respiratory failure with hypoxia (HCC) 11/11/2014  . AKI (acute kidney injury) (HCC) 11/11/2014  . Hypernatremia 11/11/2014  . Hypokalemia 11/09/2014  . COPD exacerbation (HCC)   . Acute on chronic diastolic heart failure (HCC)   . Hypoxia   . SOB (shortness of breath)   . Pressure ulcer stage II 11/02/2014  . Palliative care encounter 11/02/2014  . HCAP (healthcare-associated pneumonia) 11/01/2014  . Leukocytosis 10/15/2014  . Closed fracture of part of upper end of humerus 10/14/2014  . Poor balance 11/25/2013  . History of stroke 11/25/2013  . 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 Myalgia 09/05/2012  . Disequilibrium 09/05/2012  . Hypertension, essential 09/05/2012  . 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 Mobility impaired 07/15/2012  . Physical exam, routine 12/07/2011  . Cerumen impaction 04/04/2011  . Trapezius muscle spasm 04/04/2011  . HTN (hypertension) 11/30/2010  . Hyperlipidemia 11/30/2010  . CVA (cerebral infarction) 11/30/2010  . Postmenopausal HRT (hormone replacement therapy) 11/30/2010  . Anxiety 11/30/2010  . Ankle weakness 11/30/2010   Labs  04/16/2015.  Albumin 2.8 otherwise liver function tests within normal limits.  Sodium 136 potassium 4.1 BUN 22 creatinine 0.6.  WBC 5.1 hemoglobin 10.3 platelets 174.    03/08/2015.  HDL 31--LDL 76--cholesterol 125--triglycerides 89.  02/19/2015.  WBC 6.4 hemoglobin 8.4 platelets 197.  Sodium 138 potassium 3.8 BUN 24 creatinine  0.6.  01/19/2015.  Albumin 2.3 otherwise liver function tests within normal limits.  01/19/2015.  Sodium 134 potassium 3.8 BUN 13 creatinine 0.5  Albumin 2.3.  01/15/2015.  WBC 6.0 hemoglobin 7.2 platelets 277  01/05/2015.  Sodium 140 potassium 4.3 BUN 31 creatinine 0.7.   WBC is 7.0 hemoglobin 7.2 platelets 357.  MCV 80.4.    12/14/2014.  Sodium 141 potassium 4.1 BUN 20 creatinine 0.5.  Hemoglobin 8.2 WBC 7.6 platelets 335.  I do note hemoglobin 7.9 on July 26 12/09/2014.  Sodium 139 potassium 3.9 BUN 22 creatinine 0.6.  WBC 7.7 hemoglobin 8.3 platelets 394.  Ferritin 205-folate 7.2-iron 21-TIBC 160.  Vitamin B12 384.    CBC    Component Value Date/Time   WBC 5.5 04/16/2015   WBC 11.4* 11/05/2014 0225   RBC 3.23* 11/05/2014 0225   HGB 10.3* 04/16/2015   HCT 32* 04/16/2015   PLT 174 04/16/2015   MCV 86.7 11/05/2014 0225   LYMPHSABS 1.1 11/01/2014 1050   MONOABS 1.1* 11/01/2014 1050   EOSABS 0.0 11/01/2014 1050   BASOSABS 0.0 11/01/2014 1050    CMP     Component Value Date/Time   NA 136* 04/16/2015   NA 145 11/05/2014 0225   K 4.1 04/16/2015   CL 110 11/05/2014 0225   CO2 28 11/05/2014 0225   GLUCOSE 96 11/05/2014 0225   BUN 22* 04/16/2015   BUN 19 11/05/2014 0225   CREATININE 0.6 04/16/2015   CREATININE 0.67 11/05/2014 0225   CALCIUM 7.7* 11/05/2014 0225   PROT 4.8* 11/01/2014 1050   ALBUMIN 1.3* 11/01/2014 1050   AST 24 04/16/2015   ALT 19 04/16/2015   ALKPHOS 67 04/16/2015   BILITOT 0.6 11/01/2014 1050   GFRNONAA >60 11/05/2014 0225   GFRAA >60 11/05/2014 0225    Assessment and Plan  #1 history of failure to thrive with dementia-this actually appears to continue to improve- she is eating and drinking better appears more alert and interactive --fewer periods of agitation at this point continue current medications including Namenda X are-Lexapro for concurrent depression-and Remeron for depression/appetite stimulation.  She  continues with Ativan as needed apparently Does not need this often.  #2 anemia with an element of chronic disease and iron deficiency hemoglobin is trending up she is on iron-will update a CBC hemoglobin improved at 10.3 on lab done in December  #3 hypertension this appears stable with occasional variability on Norvasc as well as atenolol.   #4 history of osteoarthritis with history of right humeral fracture pain appears to be controlled with the tramadol she does have OxyIR for breakthrough pain  she is followed by orthopedics.  #5 history of hyperlipidemia she is [place] as well as atorvastatin--LDL 76- total cholesterol 161 on 03/08/2015 this appears to be stable--will update lipid panel as well as liver function tests   #6-history of respiratory failure she is on Pulmicort routinely her respiratory status has been stable now for some time.  She also has albuterol nebulizers when necessary.   #7 History of CVA she continues on aspirin--has been seen by neurology and has some Parkinsonian  traits but this appears to be stable but could not really appreciate focal weakness.--She continues on a statin as well  #8 History allergic rhinitis this is been stable for some time she continues on Allegra routinely. #9 Apparently history of electrolyte abnormality she is on potassium supplementation Will update a metabolic panel.  Also will update liver function tests as noted above . #10 History of GERD symptoms--stomach discomfort- and she continues on Protonix twice a day--this appears to be stable again she is eating better   #11-conjunctivitis-at this point this appears to be a subconjunctival hemorrhage at this point monitor I do  not see exudate surrounding orbital erythema no drainage but this will have to be watched  [id number]-of note greater than 35 minutes spent assessing patient-reviewing her chart-and coordinating and formulating a plan of care for numerous diagnoses-of note  greater than 50% of time spent coordinating plan of care              .   d      Mayari Matus C,

## 2015-08-05 ENCOUNTER — Non-Acute Institutional Stay (SKILLED_NURSING_FACILITY): Payer: Medicare Other | Admitting: Internal Medicine

## 2015-08-05 ENCOUNTER — Encounter: Payer: Self-pay | Admitting: Internal Medicine

## 2015-08-05 DIAGNOSIS — M79604 Pain in right leg: Secondary | ICD-10-CM

## 2015-08-05 DIAGNOSIS — H109 Unspecified conjunctivitis: Secondary | ICD-10-CM | POA: Diagnosis not present

## 2015-08-05 NOTE — Progress Notes (Signed)
Patient ID: Ayaka N Rusnak, female   DOB: Sep 12, 1928, 82 y.o.   MRN: [id number]      MRN: [id number] Name: Beverly Black  Sex: female Age: 82 y.o. DOB: Nov 05, 1928  [facility]: [name] Facility/Room:107 Level Of Care: SNF Provider: Roena Malady Emergency Contacts: Extended Emergency Contact Information Primary Emergency Contact: [name],[name] Address: [address]          [name] [address] [place] Home Phone: [phone] Relation: Daughter Secondary Emergency Contact: [name] [place] Home Phone: [phone] Relation: Daughter  Code Status:   Allergies: Sulfa antibiotics This is an acute visit  Chief complaint- Acute visit secondary to right leg pain  HPI: Patient is 81 y.o. female a complicated history including a right humerus fracture-history of pneumonia and respiratory failure-general weakness--FTT.  Patient is followed by orthopedics for her fracture apparently this is stable  She actually has done quite well here after some initial challenges with failure to thrive and pain issues.  Patient apparently has recently been complaining of some right leg pain the last day or 2 apparently there's been no history of trauma she does have tramadol every 6 hours as needed for pain she says this is helping.   .         Hdoes have a history of significant osteoarthritis complicated with her humerus fracture which appears to be stable at this time she has tramadol and has OxyIR if needed.          Past Medical History  Diagnosis Date  . Hypertension   . Hyperlipidemia   . Stroke (HCC) 10/2008  . Aneurysm (HCC)     of the eye  . Osteoporosis     Past Surgical History  Procedure Laterality Date  . Eye surgery    . Partial hysterectomy  1968      Medication List       This list is accurate as of: 08/05/15 11:59 PM.  Always use your most recent med list.               amLODipine 10 MG tablet   Commonly known as:  NORVASC  TAKE 1 TABLET AT BEDTIME     aspirin 81 MG tablet  Take 81 mg by mouth daily.     atenolol 50 MG tablet  Commonly known as:  TENORMIN  TAKE 1 TABLET TWICE A DAY     atorvastatin 40 MG tablet  Commonly known as:  LIPITOR  TAKE 1 TABLET DAILY     BIOFREEZE 4 % Gel  Generic drug:  Menthol (Topical Analgesic)  Apply 1 application topically 2 (two) times daily.     budesonide 0.25 MG/2ML nebulizer solution  Commonly known as:  PULMICORT  Take 2 mLs (0.25 mg total) by nebulization 2 (two) times daily.     cholecalciferol 1000 units tablet  Commonly known as:  VITAMIN D  Take 1,000 Units by mouth daily.     docusate sodium 100 MG capsule  Commonly known as:  COLACE  Take 100 mg by mouth 2 (two) times daily.     escitalopram 10 MG tablet  Commonly known as:  LEXAPRO  Take 10 mg by mouth daily.     feeding supplement (PRO-STAT SUGAR FREE 64) Liqd  Take 30 mLs by mouth 2 (two) times daily.     ferrous fumarate 325 (106 Fe) MG Tabs tablet  Commonly known as:  HEMOCYTE - 106 mg FE  Take 1  tablet by mouth daily.     fexofenadine 180 MG tablet  Commonly known as:  ALLEGRA  Take 180 mg by mouth daily.     ipratropium-albuterol 0.5-2.5 (3) MG/3ML Soln  Commonly known as:  DUONEB  Take 3 mLs by nebulization every 4 (four) hours as needed (SOB/wheezing).     LORazepam 0.5 MG tablet  Commonly known as:  ATIVAN  Take 1 tablet (0.5 mg total) by mouth every 8 (eight) hours.     mirtazapine 15 MG tablet  Commonly known as:  REMERON  Take 15 mg by mouth at bedtime.     MULTIVITAMIN ADULT PO  Take 1 tablet by mouth daily.     NAMENDA XR 28 MG Cp24 24 hr capsule  Generic drug:  memantine  Take 28 mg by mouth daily.     oxyCODONE 5 MG immediate release tablet  Commonly known as:  Oxy IR/ROXICODONE  Take 1 tablet (5 mg total) by mouth every 6 (six) hours as needed for severe pain.     pantoprazole 40 MG tablet  Commonly known as:  PROTONIX   Take 1 tablet (40 mg total) by mouth 2 (two) times daily.     polyethylene glycol packet  Commonly known as:  MIRALAX / GLYCOLAX  Take 17 g by mouth daily. To be hold for diarrhea     Potassium Chloride ER 20 MEQ Tbcr  Take 1 tablet by mouth daily.     PRESCRIPTION MEDICATION  Pt to take 1 two times a day to promote weight gain. MAGIC CUP     senna 8.6 MG tablet  Commonly known as:  SENOKOT  Take 1 tablet by mouth 2 (two) times daily.     traMADol 50 MG tablet  Commonly known as:  ULTRAM  Take 1 tablet (50 mg total) by mouth every 6 (six) hours as needed for moderate pain.     vitamin C 500 MG tablet  Commonly known as:  ASCORBIC ACID  Take 500 mg by mouth 2 (two) times daily.     ZETIA 10 MG tablet  Generic drug:  ezetimibe  TAKE 1 TABLET DAILY          Immunization History  Administered Date(s) Administered  . Influenza,inj,Quad PF,36+ Mos 04/16/2013, 05/06/2014  . Influenza-Unspecified 02/03/2015    Social History  Substance Use Topics  . Smoking status: Current Every Day Smoker -- 0.50 packs/day for 50 years  . Smokeless tobacco: Not on file  . Alcohol Use: No    Review of Systems  DATA OBTAINED: from patient, nurse limited secondary to dementia,  GENERAL:  no fevers, fatigue, continues to eat well SKIN: No itching, rash Eyes does not complain of visual changes I did see her last week for some slight erythema of her right eye--questionable some exudate noted RESPIRATORY: No cough, wheezing, SOB CARDIAC: No chest pain, palpitations, lower extremity edema  GI: No abdominal pain, No N/V/D or constipation, No heartburn or reflux  GU: No dysuria, frequency or urgency, or incontinence  MUSCULOSKELETAL:  history of right humerus fracture this appears to be relatively stable pain appears to be controlled she has complained of some right leg pain this appears to be more in the upper leg but not extending to the hip NEUROLOGIC: No headache, dizziness   PSYCHIATRIC:has less anxiety-does have dementia but this appears to be more stable with fewer behaviors    Physical Exam   Temperature 97.4 pulse 72 respirations 18 blood pressure 120/56 GENERAL APPEARANCE: Alert,  conversant,Thin WF No acute distress sitting comfortably in her wheelchair this afternoon SKIN: No diaphoresis rash;  HEENT: Oropharynx is Unremarkable membranes appear fairly mois- Right eye continues to have a bit of pale erythema there is no surrounding orbital edema erythema-visual acuity appears intact  RESPIRATORY: Breathing is even, unlabored. Lung sounds are clear   CARDIOVASCULAR: Heart RRR with distant heart sounds no murmurs, rubs or gallops. No peripheral edema  GASTROINTESTINAL: Abdomen is soft, non-tender, somewhat protuberant which is baseline w/ normal bowel sounds.   MUSCULOSKELETAL: -again does have the right humerus fracture with limited mobility of her right arm and shoulder which is no-t new She does have some stiffness of her right leg with limited range of motion at the knee-there is some tenderness to palpation of the femur area-I could not really appreciate tenderness to palpation of the hip area however-there is no increased edema or erythema noticeable on exam.  I did not note any deformities other than possible arthritic D NEUROLOGIC: Cranial nerves 2-12 grossly intact. Moves all extremities PSYCHIATRIC: Mood and affect appropriate to situation, she is pleasant although somewhat confused does answer questions    Patient Active Problem List   Diagnosis Date Noted  . Right leg pain 08/05/2015  . COPD (chronic obstructive pulmonary disease) (HCC) 06/25/2015  . Loss of weight 01/21/2015  . Anemia 01/05/2015  . Acute upper respiratory infection 01/05/2015  . Dementia with behavioral disturbance 12/31/2014  . Iron deficiency anemia 12/10/2014  . Stomach discomfort 11/30/2014  . Subacute confusional state 11/30/2014  . Acute respiratory failure  with hypoxia (HCC) 11/11/2014  . AKI (acute kidney injury) (HCC) 11/11/2014  . Hypernatremia 11/11/2014  . Hypokalemia 11/09/2014  . COPD exacerbation (HCC)   . Acute on chronic diastolic heart failure (HCC)   . Hypoxia   . SOB (shortness of breath)   . Pressure ulcer stage II 11/02/2014  . Palliative care encounter 11/02/2014  . HCAP (healthcare-associated pneumonia) 11/01/2014  . Leukocytosis 10/15/2014  . Closed fracture of part of upper end of humerus 10/14/2014  . Poor balance 11/25/2013  . History of stroke 11/25/2013  . 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 Myalgia 09/05/2012  . Disequilibrium 09/05/2012  . Hypertension, essential 09/05/2012  . 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 Mobility impaired 07/15/2012  . Physical exam, routine 12/07/2011  . Cerumen impaction 04/04/2011  . Trapezius muscle spasm 04/04/2011  . HTN (hypertension) 11/30/2010  . Hyperlipidemia 11/30/2010  . CVA (cerebral infarction) 11/30/2010  . Postmenopausal HRT (hormone replacement therapy) 11/30/2010  . Anxiety 11/30/2010  . Ankle weakness 11/30/2010   Labs  04/16/2015.  Albumin 2.8 otherwise liver function tests within normal limits.  Sodium 136 potassium 4.1 BUN 22 creatinine 0.6.  WBC 5.1 hemoglobin 10.3 platelets 174.    03/08/2015.  HDL 31--LDL 76--cholesterol 125--triglycerides 89.  02/19/2015.  WBC 6.4 hemoglobin 8.4 platelets 197.  Sodium 138 potassium 3.8 BUN 24 creatinine 0.6.  01/19/2015.  Albumin 2.3 otherwise liver function tests within normal limits.  01/19/2015.  Sodium 134 potassium 3.8 BUN 13 creatinine 0.5  Albumin 2.3.  01/15/2015.  WBC 6.0 hemoglobin 7.2 platelets 277  01/05/2015.  Sodium 140 potassium 4.3 BUN 31 creatinine 0.7.   WBC is 7.0 hemoglobin 7.2 platelets 357.  MCV 80.4.    12/14/2014.  Sodium 141 potassium 4.1 BUN 20 creatinine 0.5.  Hemoglobin 8.2 WBC 7.6 platelets 335.  I do note hemoglobin 7.9 on July  26 12/09/2014.  Sodium 139 potassium 3.9 BUN 22 creatinine 0.6.  WBC 7.7 hemoglobin  8.3 platelets 394.  Ferritin 205-folate 7.2-iron 21-TIBC 160.  Vitamin B12 384.    CBC    Component Value Date/Time   WBC 5.5 04/16/2015   WBC 11.4* 11/05/2014 0225   RBC 3.23* 11/05/2014 0225   HGB 10.3* 04/16/2015   HCT 32* 04/16/2015   PLT 174 04/16/2015   MCV 86.7 11/05/2014 0225   LYMPHSABS 1.1 11/01/2014 1050   MONOABS 1.1* 11/01/2014 1050   EOSABS 0.0 11/01/2014 1050   BASOSABS 0.0 11/01/2014 1050    CMP     Component Value Date/Time   NA 136* 04/16/2015   NA 145 11/05/2014 0225   K 4.1 04/16/2015   CL 110 11/05/2014 0225   CO2 28 11/05/2014 0225   GLUCOSE 96 11/05/2014 0225   BUN 22* 04/16/2015   BUN 19 11/05/2014 0225   CREATININE 0.6 04/16/2015   CREATININE 0.67 11/05/2014 0225   CALCIUM 7.7* 11/05/2014 0225   PROT 4.8* 11/01/2014 1050   ALBUMIN 1.3* 11/01/2014 1050   AST 24 04/16/2015   ALT 19 04/16/2015   ALKPHOS 67 04/16/2015   BILITOT 0.6 11/01/2014 1050   GFRNONAA >60 11/05/2014 0225   GFRAA >60 11/05/2014 0225    Assessment and Plan   History of right leg discomfort this appears to be relatively new complaint==-she does have a history of osteoarthritis-is receiving tramadol as needed for pain and has OxyIR for severe pain it appears-will order an x-ray of the leg extending from the hip on down-at this point would suspect this is more arthritic-related but would like to rule out any acute process.  #2-somewhat persistent mild right eye conjunctival erythema-originally felt this could be a subconjunctival hemorrhage but apparently it is persisting which should be resolving by now-will treat with an antibiotic eyedrop Cipro  opthsolution 0.3% 2 drops 3 times a day for 7 days and monitor--apparently there is a history possibly of some exudate here at times    #3 anemia with an element of chronic disease and iron deficiency hemoglobin is trending up she is on  iron-most recent hemoglobin 10.3 in December 2016-updated labs have been ordered I do not see those on the chart currently will write an order to obtain those results for follow-up-although clinically she appears to be stable in this regard.  [id number]                      .   d      LASSEN, ARLO C,

## 2015-08-06 ENCOUNTER — Other Ambulatory Visit: Payer: Self-pay | Admitting: Internal Medicine

## 2015-08-06 DIAGNOSIS — M4628 Osteomyelitis of vertebra, sacral and sacrococcygeal region: Secondary | ICD-10-CM

## 2015-08-06 LAB — BASIC METABOLIC PANEL
BUN: 25 mg/dL — AB (ref 4–21)
CREATININE: 0.6 mg/dL (ref 0.5–1.1)
Glucose: 135 mg/dL
Potassium: 4.3 mmol/L (ref 3.4–5.3)
Sodium: 136 mmol/L — AB (ref 137–147)

## 2015-08-11 LAB — HEMOGLOBIN A1C: Hemoglobin A1C: 6.1

## 2015-08-13 ENCOUNTER — Ambulatory Visit (HOSPITAL_COMMUNITY)
Admission: RE | Admit: 2015-08-13 | Discharge: 2015-08-13 | Disposition: A | Discharge status: Home / Self Care | Payer: Medicare Other | Source: Ambulatory Visit | Attending: Internal Medicine | Admitting: Internal Medicine

## 2015-08-13 ENCOUNTER — Other Ambulatory Visit: Payer: Self-pay | Admitting: Internal Medicine

## 2015-08-13 DIAGNOSIS — Z1389 Encounter for screening for other disorder: Secondary | ICD-10-CM | POA: Diagnosis not present

## 2015-08-13 DIAGNOSIS — M4628 Osteomyelitis of vertebra, sacral and sacrococcygeal region: Secondary | ICD-10-CM

## 2015-08-13 DIAGNOSIS — M5136 Other intervertebral disc degeneration, lumbar region: Secondary | ICD-10-CM | POA: Insufficient documentation

## 2015-08-13 DIAGNOSIS — L98499 Non-pressure chronic ulcer of skin of other sites with unspecified severity: Secondary | ICD-10-CM | POA: Insufficient documentation

## 2015-08-13 DIAGNOSIS — M869 Osteomyelitis, unspecified: Secondary | ICD-10-CM | POA: Insufficient documentation

## 2015-08-30 ENCOUNTER — Encounter: Payer: Self-pay | Admitting: Internal Medicine

## 2015-08-30 ENCOUNTER — Non-Acute Institutional Stay (SKILLED_NURSING_FACILITY): Payer: Medicare Other | Admitting: Internal Medicine

## 2015-08-30 DIAGNOSIS — D509 Iron deficiency anemia, unspecified: Secondary | ICD-10-CM | POA: Diagnosis not present

## 2015-08-30 DIAGNOSIS — R112 Nausea with vomiting, unspecified: Secondary | ICD-10-CM

## 2015-08-30 NOTE — Progress Notes (Signed)
Patient ID: Beverly Black, female   DOB: 05/28/1928, 82 y.o.   MRN: [id number]      MRN: [id number] Name: Beverly Black  Sex: female Age: 82 y.o. DOB: 01/28/1929  [facility] Facility/Room:107 Level Of Care: SNF Provider: London SheerLuster, Sally C Emergency Contacts: Extended Emergency Contact Information Primary Emergency Contact: [name],[name] Address: [address]          [place] [id number] [place] Home Phone: [phone] Relation: Daughter Secondary Emergency Contact: [name],[name]  [place] Home Phone: [phone] Relation: Daughter  Code Status:   Allergies: Sulfa antibiotics This is a routine visit.  Chief complaint Acute visit follow-up nausea vomiting this weekend e  HPI: Patient is 82 y.o. female a complicated history including a right humerus fracture-history of pneumonia and respiratory failure-general weakness--FTT.  Patient is followed by orthopedics for her fracture apparently this is stable .     Also has a history of anemia thought to have an element of chronic disease she has been started on iron - h This appears to be helping her hemoglobin is slowly rising most recently 10.3 on lab done December 9   She does have a history of dementia with behaviors but this has been significantly improve recently as well she does continue on Ativan as needed.  She is on Namenda X R.  In regards to appetite stimulation she is on Remeron in this appears to be helping   Apparently last Friday patient did have an episode of vomiting.  However talking with nursing staff she is now back at her baseline eating and drinking-when I saw her today she appeared to be back at her baseline pleasant interactive vital signs have been stable.            Past Medical History  Diagnosis Date  . Hypertension   . Hyperlipidemia   . Stroke (HCC) 10/2008  . Aneurysm (HCC)     of the eye  . Osteoporosis     Past Surgical  History  Procedure Laterality Date  . Eye surgery    . Partial hysterectomy  1968      Medication List       This list is accurate as of: 08/30/15  2:28 PM.  Always use your most recent med list.               amLODipine 10 MG tablet  Commonly known as:  NORVASC  TAKE 1 TABLET AT BEDTIME     aspirin 81 MG tablet  Take 81 mg by mouth daily.     atenolol 50 MG tablet  Commonly known as:  TENORMIN  TAKE 1 TABLET TWICE A DAY     atorvastatin 40 MG tablet  Commonly known as:  LIPITOR  TAKE 1 TABLET DAILY     BIOFREEZE 4 % Gel  Generic drug:  Menthol (Topical Analgesic)  Apply 1 application topically 2 (two) times daily.     budesonide 0.25 MG/2ML nebulizer solution  Commonly known as:  PULMICORT  Take 2 mLs (0.25 mg total) by nebulization 2 (two) times daily.     cholecalciferol 1000 units tablet  Commonly known as:  VITAMIN D  Take 1,000 Units by mouth daily.     docusate sodium 100 MG capsule  Commonly known as:  COLACE  Take 100 mg by mouth 2 (two) times daily.     escitalopram 10 MG tablet  Commonly known as:  LEXAPRO  Take 10 mg by  mouth daily.     feeding supplement (PRO-STAT SUGAR FREE 64) Liqd  Take 30 mLs by mouth 2 (two) times daily.     ferrous sulfate 325 (65 FE) MG tablet  Take 325 mg by mouth daily with breakfast.     fexofenadine 180 MG tablet  Commonly known as:  ALLEGRA  Take 180 mg by mouth daily.     ipratropium-albuterol 0.5-2.5 (3) MG/3ML Soln  Commonly known as:  DUONEB  Take 3 mLs by nebulization every 4 (four) hours as needed (SOB/wheezing).     LORazepam 0.5 MG tablet  Commonly known as:  ATIVAN  Take 1 tablet (0.5 mg total) by mouth every 8 (eight) hours.     mirtazapine 15 MG tablet  Commonly known as:  REMERON  Take 15 mg by mouth at bedtime.     MULTIVITAMIN ADULT PO  Take 1 tablet by mouth daily.     NAMENDA XR 28 MG Cp24 24 hr capsule  Generic drug:  memantine  Take 28 mg by mouth daily.     NON FORMULARY  Give  medpass 4 oz bid with medpass d/t weight loss     oxyCODONE 5 MG immediate release tablet  Commonly known as:  Oxy IR/ROXICODONE  Take 1 tablet (5 mg total) by mouth every 6 (six) hours as needed for severe pain.     pantoprazole 40 MG tablet  Commonly known as:  PROTONIX  Take 1 tablet (40 mg total) by mouth 2 (two) times daily.     polyethylene glycol packet  Commonly known as:  MIRALAX / GLYCOLAX  Take 17 g by mouth daily. To be hold for diarrhea     Potassium Chloride ER 20 MEQ Tbcr  Take 1 tablet by mouth daily.     senna 8.6 MG tablet  Commonly known as:  SENOKOT  Take 1 tablet by mouth 2 (two) times daily.     traMADol 50 MG tablet  Commonly known as:  ULTRAM  Give 1 tablet by mouth every 8 hours scheduled for pain     vitamin C 500 MG tablet  Commonly known as:  ASCORBIC ACID  Take 500 mg by mouth 2 (two) times daily.     ZETIA 10 MG tablet  Generic drug:  ezetimibe  TAKE 1 TABLET DAILY          Immunization History  Administered Date(s) Administered  . Influenza,inj,Quad PF,36+ Mos 04/16/2013, 05/06/2014  . Influenza-Unspecified 02/03/2015    Social History  Substance Use Topics  . Smoking status: Current Every Day Smoker -- 0.50 packs/day for 50 years  . Smokeless tobacco: Not on file  . Alcohol Use: No    Review of Systems  DATA OBTAINED: from patient, nurse limited secondary to dementia,  GENERAL:  no fevers, fatigue, continues to eat well SKIN: No itching, rash HEENT: does not complain by pain discharge or erythema RESPIRATORY: No cough, wheezing, SOB CARDIAC: No chest pain, palpitations, lower extremity edema  GI: No abdominal pain, episode of nausea and vomiting over the weekend does not complain of that now  GU: No dysuria, frequency or urgency, or incontinence  MUSCULOSKELETAL:  history of right humerus fracture this appears to be relatively stable pain appears to be controlled  NEUROLOGIC: No headache, dizziness  PSYCHIATRIC:has less  anxiety-does have dementia but this appears to be more stable with fewer behaviors    Physical Exam  T- 98.2 pulse 74 respirations 18 blood percent 123/68  GENERAL APPEARANCE: Alert, conversant,Thin WF  No acute distress sitting comfortably in her wheelchair this afternoon- SKIN: No diaphoresis rash;  HEENT: Oropharynx is Unremarkable membranes appear fairly mois--visual acuity appears grossly intact she has prescription lenses t--r Sclerae and conjunctivae are clear i t RESPIRATORY: Breathing is even, unlabored. Lung sounds are clear   CARDIOVASCULAR: Heart RRR with distant heart sounds no murmurs, rubs or gallops. No peripheral edema  GASTROINTESTINAL: Abdomen is soft, non-tender, somewhat protuberant which is baseline w/ normal bowel sounds.   MUSCULOSKELETAL: No abnormal joints or musculature--again does have the right humerus fracture with limited mobility of her right arm and shoulder which is no-t new D NEUROLOGIC: Cranial nerves 2-12 grossly intact. Moves all extremities PSYCHIATRIC: Mood and affect appropriate to situation, she is pleasant although somewhat confused does answer questions    Patient Active Problem List   Diagnosis Date Noted  . Right leg pain 08/05/2015  . COPD (chronic obstructive pulmonary disease) (HCC) 06/25/2015  . Loss of weight 01/21/2015  . Anemia 01/05/2015  . Acute upper respiratory infection 01/05/2015  . Dementia with behavioral disturbance 12/31/2014  . Iron deficiency anemia 12/10/2014  . Stomach discomfort 11/30/2014  . Subacute confusional state 11/30/2014  . Acute respiratory failure with hypoxia (HCC) 11/11/2014  . AKI (acute kidney injury) (HCC) 11/11/2014  . Hypernatremia 11/11/2014  . Hypokalemia 11/09/2014  . COPD exacerbation (HCC)   . Acute on chronic diastolic heart failure (HCC)   . Hypoxia   . SOB (shortness of breath)   . Pressure ulcer stage II 11/02/2014  . Palliative care encounter 11/02/2014  . HCAP  (healthcare-associated pneumonia) 11/01/2014  . Leukocytosis 10/15/2014  . Closed fracture of part of upper end of humerus 10/14/2014  . Poor balance 11/25/2013  . History of stroke 11/25/2013  . 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 Myalgia 09/05/2012  . Disequilibrium 09/05/2012  . Hypertension, essential 09/05/2012  . 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 Mobility impaired 07/15/2012  . Physical exam, routine 12/07/2011  . Cerumen impaction 04/04/2011  . Trapezius muscle spasm 04/04/2011  . HTN (hypertension) 11/30/2010  . Hyperlipidemia 11/30/2010  . CVA (cerebral infarction) 11/30/2010  . Postmenopausal HRT (hormone replacement therapy) 11/30/2010  . Anxiety 11/30/2010  . Ankle weakness 11/30/2010   Labs  04/16/2015.  Albumin 2.8 otherwise liver function tests within normal limits.  Sodium 136 potassium 4.1 BUN 22 creatinine 0.6.  WBC 5.1 hemoglobin 10.3 platelets 174.    03/08/2015.  HDL 31--LDL 76--cholesterol 125--triglycerides 89.  02/19/2015.  WBC 6.4 hemoglobin 8.4 platelets 197.  Sodium 138 potassium 3.8 BUN 24 creatinine 0.6.  01/19/2015.  Albumin 2.3 otherwise liver function tests within normal limits.  01/19/2015.  Sodium 134 potassium 3.8 BUN 13 creatinine 0.5  Albumin 2.3.  01/15/2015.  WBC 6.0 hemoglobin 7.2 platelets 277  01/05/2015.  Sodium 140 potassium 4.3 BUN 31 creatinine 0.7.   WBC is 7.0 hemoglobin 7.2 platelets 357.  MCV 80.4.    12/14/2014.  Sodium 141 potassium 4.1 BUN 20 creatinine 0.5.  Hemoglobin 8.2 WBC 7.6 platelets 335.  I do note hemoglobin 7.9 on July 26 12/09/2014.  Sodium 139 potassium 3.9 BUN 22 creatinine 0.6.  WBC 7.7 hemoglobin 8.3 platelets 394.  Ferritin 205-folate 7.2-iron 21-TIBC 160.  Vitamin B12 384.    CBC    Component Value Date/Time   WBC 5.5 04/16/2015   WBC 11.4* 11/05/2014 0225   RBC 3.23* 11/05/2014 0225   HGB 10.3* 04/16/2015   HCT 32* 04/16/2015   PLT 174  04/16/2015   MCV 86.7 11/05/2014 0225  LYMPHSABS 1.1 11/01/2014 1050   MONOABS 1.1* 11/01/2014 1050   EOSABS 0.0 11/01/2014 1050   BASOSABS 0.0 11/01/2014 1050    CMP     Component Value Date/Time   NA 136* 04/16/2015   NA 145 11/05/2014 0225   K 4.1 04/16/2015   CL 110 11/05/2014 0225   CO2 28 11/05/2014 0225   GLUCOSE 96 11/05/2014 0225   BUN 22* 04/16/2015   BUN 19 11/05/2014 0225   CREATININE 0.6 04/16/2015   CREATININE 0.67 11/05/2014 0225   CALCIUM 7.7* 11/05/2014 0225   PROT 4.8* 11/01/2014 1050   ALBUMIN 1.3* 11/01/2014 1050   AST 24 04/16/2015   ALT 19 04/16/2015   ALKPHOS 67 04/16/2015   BILITOT 0.6 11/01/2014 1050   GFRNONAA >60 11/05/2014 0225   GFRAA >60 11/05/2014 0225    Assessment and Plan  #1 nausea and vomiting-this appears to have resolved unremarkably patient is back at her baseline does not complain of any abdominal pain or discomfort-will update a metabolic panel to look at her electrolytes as well as a CBC.--One would wonder about a transitory virus or possibly not tolerating some food  #2 history of anemia again hemoglobin appears to be trending up will update a CBC.--She is on iron  [id number]-of note greater than 25 minutes spent assessing patient reviewing her chart discussing her status with nursing staff and coordinating plan of care                         .   d

## 2015-08-31 NOTE — Progress Notes (Signed)
MRN: [id number] Name: Beverly Black  Sex: female Age: 82 y.o. DOB: July 25, 1928  [facility] #: [name] Facility/Room: Level Of Care: SNF Provider: Merrilee Seashore MD Emergency Contacts: Extended Emergency Contact Information Primary Emergency Contact: [name],[name] Address: [address]          [name] [address] [place] Home Phone: [phone] Relation: Daughter Secondary Emergency Contact: [name] [place] Home Phone: [phone] Relation: Daughter  Code Status: DNR  Allergies: Sulfa antibiotics  Chief Complaint  Patient presents with  . Medical Management of Chronic Issues    HPI: Patient is 82 y.o. female who is being seen for routine issues of CHF, dementia and osteoporosis.  Past Medical History  Diagnosis Date  . Hypertension   . Hyperlipidemia   . Stroke (HCC) 10/2008  . Aneurysm (HCC)     of the eye  . Osteoporosis     Past Surgical History  Procedure Laterality Date  . Eye surgery    . Partial hysterectomy  1968      Medication List       This list is accurate as of: 09/01/15 11:59 PM.  Always use your most recent med list.               amLODipine 10 MG tablet  Commonly known as:  NORVASC  TAKE 1 TABLET AT BEDTIME     aspirin 81 MG tablet  Take 81 mg by mouth daily.     atenolol 50 MG tablet  Commonly known as:  TENORMIN  TAKE 1 TABLET TWICE A DAY     atorvastatin 40 MG tablet  Commonly known as:  LIPITOR  TAKE 1 TABLET DAILY     BIOFREEZE 4 % Gel  Generic drug:  Menthol (Topical Analgesic)  Apply 1 application topically 2 (two) times daily.     budesonide 0.25 MG/2ML nebulizer solution  Commonly known as:  PULMICORT  Take 2 mLs (0.25 mg total) by nebulization 2 (two) times daily.     cholecalciferol 1000 units tablet  Commonly known as:  VITAMIN D  Take 1,000 Units by mouth daily.     docusate sodium 100 MG capsule  Commonly known as:  COLACE  Take 100 mg by mouth 2 (two) times  daily.     escitalopram 10 MG tablet  Commonly known as:  LEXAPRO  Take 10 mg by mouth daily.     feeding supplement (PRO-STAT SUGAR FREE 64) Liqd  Take 30 mLs by mouth 2 (two) times daily.     ferrous sulfate 325 (65 FE) MG tablet  Take 325 mg by mouth daily with breakfast.     fexofenadine 180 MG tablet  Commonly known as:  ALLEGRA  Take 180 mg by mouth daily.     ipratropium-albuterol 0.5-2.5 (3) MG/3ML Soln  Commonly known as:  DUONEB  Take 3 mLs by nebulization every 4 (four) hours as needed (SOB/wheezing).     LORazepam 0.5 MG tablet  Commonly known as:  ATIVAN  Take 1 tablet (0.5 mg total) by mouth every 8 (eight) hours.     mirtazapine 15 MG tablet  Commonly known as:  REMERON  Take 15 mg by mouth at bedtime.     MULTIVITAMIN ADULT PO  Take 1 tablet by mouth daily.     NAMENDA XR 28 MG Cp24 24 hr capsule  Generic drug:  memantine  Take 28 mg by mouth daily.     NON FORMULARY  Give medpass 4  oz bid with medpass d/t weight loss     oxyCODONE 5 MG immediate release tablet  Commonly known as:  Oxy IR/ROXICODONE  Take 1 tablet (5 mg total) by mouth every 6 (six) hours as needed for severe pain.     pantoprazole 40 MG tablet  Commonly known as:  PROTONIX  Take 1 tablet (40 mg total) by mouth 2 (two) times daily.     polyethylene glycol packet  Commonly known as:  MIRALAX / GLYCOLAX  Take 17 g by mouth daily. To be hold for diarrhea     Potassium Chloride ER 20 MEQ Tbcr  Take 1 tablet by mouth daily.     senna 8.6 MG tablet  Commonly known as:  SENOKOT  Take 1 tablet by mouth 2 (two) times daily.     traMADol 50 MG tablet  Commonly known as:  ULTRAM  Give 1 tablet by mouth every 8 hours scheduled for pain     vitamin C 500 MG tablet  Commonly known as:  ASCORBIC ACID  Take 500 mg by mouth 2 (two) times daily.     ZETIA 10 MG tablet  Generic drug:  ezetimibe  TAKE 1 TABLET DAILY        No orders of the defined types were placed in this  encounter.    Immunization History  Administered Date(s) Administered  . Influenza,inj,Quad PF,36+ Mos 04/16/2013, 05/06/2014  . Influenza-Unspecified 02/03/2015    Social History  Substance Use Topics  . Smoking status: Current Every Day Smoker -- 0.50 packs/day for 50 years  . Smokeless tobacco: Not on file  . Alcohol Use: No    Review of Systems  DATA OBTAINED: from nurse GENERAL:  no fevers, fatigue, appetite changes SKIN: No itching, rash HEENT: No complaint RESPIRATORY: No cough, wheezing, SOB CARDIAC: No chest pain, palpitations, lower extremity edema  GI: No abdominal pain, No N/V/D or constipation, No heartburn or reflux  GU: No dysuria, frequency or urgency, or incontinence  MUSCULOSKELETAL: No unrelieved bone/joint pain NEUROLOGIC: No headache, dizziness  PSYCHIATRIC: No overt anxiety or sadness  Filed Vitals:   08/31/15 0931  BP: 138/71  Pulse: 83  Temp: 98 F (36.7 C)  Resp: 20    Physical Exam  GENERAL APPEARANCE: Alert, No acute distress  SKIN: No diaphoresis rash, or wounds HEENT: Unremarkable RESPIRATORY: Breathing is even, unlabored. Lung sounds are clear   CARDIOVASCULAR: Heart RRR no murmurs, rubs or gallops. No peripheral edema  GASTROINTESTINAL: Abdomen is soft, non-tender, not distended w/ normal bowel sounds.  GENITOURINARY: Bladder non tender, not distended  MUSCULOSKELETAL: No abnormal joints or musculature NEUROLOGIC: Cranial nerves 2-12 grossly intact. Moves all extremities PSYCHIATRIC: Mood and affect appropriate to situation with dementia, no behavioral issues  Patient Active Problem List   Diagnosis Date Noted  . Chronic diastolic heart failure (HCC) 09/05/2015  . Right leg pain 08/05/2015  . COPD (chronic obstructive pulmonary disease) (HCC) 06/25/2015  . Loss of weight 01/21/2015  . Anemia 01/05/2015  . Acute upper respiratory infection 01/05/2015  . Dementia with behavioral disturbance 12/31/2014  . Iron deficiency anemia  12/10/2014  . Stomach discomfort 11/30/2014  . Subacute confusional state 11/30/2014  . Acute respiratory failure with hypoxia (HCC) 11/11/2014  . AKI (acute kidney injury) (HCC) 11/11/2014  . Hypernatremia 11/11/2014  . Hypokalemia 11/09/2014  . COPD exacerbation (HCC)   . Acute on chronic diastolic heart failure (HCC)   . Hypoxia   . SOB (shortness of breath)   . Pressure  ulcer stage II 11/02/2014  . Palliative care encounter 11/02/2014  . HCAP (healthcare-associated pneumonia) 11/01/2014  . Leukocytosis 10/15/2014  . Closed fracture of part of upper end of humerus 10/14/2014  . Poor balance 11/25/2013  . History of stroke 11/25/2013  . 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 Myalgia 09/05/2012  . Disequilibrium 09/05/2012  . Hypertension, essential 09/05/2012  . 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 Mobility impaired 07/15/2012  . Physical exam, routine 12/07/2011  . Cerumen impaction 04/04/2011  . Trapezius muscle spasm 04/04/2011  . HTN (hypertension) 11/30/2010  . Hyperlipidemia 11/30/2010  . CVA (cerebral infarction) 11/30/2010  . Postmenopausal HRT (hormone replacement therapy) 11/30/2010  . Anxiety 11/30/2010  . Ankle weakness 11/30/2010    CBC    Component Value Date/Time   WBC 5.5 04/16/2015   WBC 11.4* 11/05/2014 0225   RBC 3.23* 11/05/2014 0225   HGB 10.3* 04/16/2015   HCT 32* 04/16/2015   PLT 174 04/16/2015   MCV 86.7 11/05/2014 0225   LYMPHSABS 1.1 11/01/2014 1050   MONOABS 1.1* 11/01/2014 1050   EOSABS 0.0 11/01/2014 1050   BASOSABS 0.0 11/01/2014 1050    CMP     Component Value Date/Time   NA 136* 04/16/2015   NA 145 11/05/2014 0225   K 4.1 04/16/2015   CL 110 11/05/2014 0225   CO2 28 11/05/2014 0225   GLUCOSE 96 11/05/2014 0225   BUN 22* 04/16/2015   BUN 19 11/05/2014 0225   CREATININE 0.6 04/16/2015   CREATININE 0.67 11/05/2014 0225   CALCIUM 7.7* 11/05/2014 0225   PROT 4.8* 11/01/2014 1050   ALBUMIN 1.3* 11/01/2014 1050    AST 24 04/16/2015   ALT 19 04/16/2015   ALKPHOS 67 04/16/2015   BILITOT 0.6 11/01/2014 1050   GFRNONAA >60 11/05/2014 0225   GFRAA >60 11/05/2014 0225    Assessment and Plan  Chronic diastolic heart failure (HCC) No exacerbations;stable without diuretics; cont temormin  Dementia with behavioral disturbance Chronic and progressing; Cont namenda  Osteoporosis, post-menopausal Continue vitamin D ; recheck Ca++ level, may need to add Ca++   Merrilee SeashoreAlexander, Oleta Gunnoe  MD

## 2015-09-01 ENCOUNTER — Non-Acute Institutional Stay (SKILLED_NURSING_FACILITY): Payer: Medicare Other | Admitting: Internal Medicine

## 2015-09-01 DIAGNOSIS — I5032 Chronic diastolic (congestive) heart failure: Secondary | ICD-10-CM | POA: Diagnosis not present

## 2015-09-01 DIAGNOSIS — F0391 Unspecified dementia with behavioral disturbance: Secondary | ICD-10-CM

## 2015-09-01 DIAGNOSIS — M810 Age-related osteoporosis without current pathological fracture: Secondary | ICD-10-CM

## 2015-09-01 DIAGNOSIS — F03918 Unspecified dementia, unspecified severity, with other behavioral disturbance: Secondary | ICD-10-CM

## 2015-09-01 LAB — CBC AND DIFFERENTIAL
HEMATOCRIT: 37 % (ref 36–46)
HEMOGLOBIN: 12 g/dL (ref 12.0–16.0)
PLATELETS: 185 10*3/uL (ref 150–399)
WBC: 7 10*3/mL

## 2015-09-03 LAB — HEPATIC FUNCTION PANEL
ALK PHOS: 73 U/L (ref 25–125)
ALT: 14 U/L (ref 7–35)
AST: 15 U/L (ref 13–35)
Bilirubin, Total: 0.3 mg/dL

## 2015-09-03 LAB — LIPID PANEL
Cholesterol: 150 mg/dL (ref 0–200)
HDL: 44 mg/dL (ref 35–70)
LDL CALC: 77 mg/dL
Triglycerides: 145 mg/dL (ref 40–160)

## 2015-09-03 LAB — BASIC METABOLIC PANEL
BUN: 34 mg/dL — AB (ref 4–21)
Creatinine: 0.7 mg/dL (ref <=1.1)
Glucose: 133 mg/dL
Potassium: 4.1 mmol/L (ref 3.4–5.3)
Sodium: 137 mmol/L (ref 137–147)

## 2015-09-05 ENCOUNTER — Encounter: Payer: Self-pay | Admitting: Internal Medicine

## 2015-09-05 DIAGNOSIS — I5032 Chronic diastolic (congestive) heart failure: Secondary | ICD-10-CM | POA: Insufficient documentation

## 2015-09-05 NOTE — Assessment & Plan Note (Signed)
Chronic and progressing; Cont namenda

## 2015-09-05 NOTE — Progress Notes (Signed)
Patient ID: Beverly Black, female   DOB: 05/06/1929, 82 y.o.   MRN: [id number]

## 2015-09-05 NOTE — Assessment & Plan Note (Signed)
Continue vitamin D ; recheck Ca++ level, may need to add Ca++

## 2015-09-05 NOTE — Assessment & Plan Note (Signed)
No exacerbations;stable without diuretics; cont temormin

## 2015-09-30 ENCOUNTER — Encounter: Payer: Self-pay | Admitting: Internal Medicine

## 2015-09-30 ENCOUNTER — Non-Acute Institutional Stay (SKILLED_NURSING_FACILITY): Payer: Medicare Other | Admitting: Internal Medicine

## 2015-09-30 DIAGNOSIS — J42 Unspecified chronic bronchitis: Secondary | ICD-10-CM

## 2015-09-30 DIAGNOSIS — S42201D Unspecified fracture of upper end of right humerus, subsequent encounter for fracture with routine healing: Secondary | ICD-10-CM

## 2015-09-30 DIAGNOSIS — I10 Essential (primary) hypertension: Secondary | ICD-10-CM | POA: Diagnosis not present

## 2015-09-30 DIAGNOSIS — I5032 Chronic diastolic (congestive) heart failure: Secondary | ICD-10-CM | POA: Diagnosis not present

## 2015-09-30 DIAGNOSIS — F0391 Unspecified dementia with behavioral disturbance: Secondary | ICD-10-CM

## 2015-09-30 DIAGNOSIS — F03918 Unspecified dementia, unspecified severity, with other behavioral disturbance: Secondary | ICD-10-CM

## 2015-09-30 NOTE — Progress Notes (Signed)
Location:  [facility] Room Number: 411/P Place of Service:  SNF [phone]) Provider:  Rica Koyanagi, MD  Patient Care Team: Sheliah Hatch, MD as PCP - General (Family Medicine)  Extended Emergency Contact Information Primary Emergency Contact: [name],[name] Address: [address]          [name] [address] [place] Home Phone: [phone] Relation: Daughter Secondary Emergency Contact: [name] [place] Home Phone: [phone] Relation: Daughter  Code Status:  DNR Goals of care: Advanced Directive information Advanced Directives 09/30/2015  Does patient have an advance directive? Yes  Type of Advance Directive Out of facility DNR (pink MOST or yellow form)  Does patient want to make changes to advanced directive? No - Patient declined  Copy of advanced directive(s) in chart? Yes     Chief Complaint  Patient presents with  . Medical Management of Chronic Issues    Routine visit  For medical management of chronic medical conditions including history of CHF-dementia-anemia-hypertension-history CVA-history of respiratory failure-history failure to thrive-history of right humerus fracture  HPI:  Pt is a 82 y.o. female seen today for above-stated issues-she actually has had a. If stability here-she does not report any issues today.  She had a period of failure to thrive but she appears to be responding well now eating and drinking better-- gained about 3 pounds the last couple months --She also has significant dementia with behaviors at one point but this is stabilized as well she does continue on Namenda. She is on Ativan 3 times a day for anxiety which appears to be well controlled at this point-  Patient does have a fairly distant history of her right humerus fracture this is been managed conservatively she still has limited range of motion of her right shoulder but is not  complaining of pain which is encouraging.  Also has a history of respiratory failure but this is been stable for some time she is on albuterol nebulizers when necessary she is also on Pulmicort.  Regards osteoporosis she is on vitamin D Dr. [name] has suggested following up on her calcium level and we will do that with an updated lab.  In regards to anemia this appears to be stable to improved with a hemoglobin of 12.0 on lab done April 20 she is on iron.  Today she has no complaints ambulating in her wheelchair-continues to be pleasant and appropriate.    Past Medical History  Diagnosis Date  . Hypertension   . Hyperlipidemia   . Stroke (HCC) 10/2008  . Aneurysm (HCC)     of the eye  . Osteoporosis    Past Surgical History  Procedure Laterality Date  . Eye surgery    . Partial hysterectomy  1968    Allergies  Allergen Reactions  . Sulfa Antibiotics     Cant remember reaction      Medication List       This list is accurate as of: 09/30/15  3:08 PM.  Always use your most recent med list.               amLODipine 10 MG tablet  Commonly known as:  NORVASC  TAKE 1 TABLET AT BEDTIME     aspirin 81 MG tablet  Take 81 mg by mouth daily.     atenolol 50 MG tablet  Commonly known as:  TENORMIN  TAKE 1 TABLET TWICE A DAY     atorvastatin  40 MG tablet  Commonly known as:  LIPITOR  TAKE 1 TABLET DAILY     BIOFREEZE 4 % Gel  Generic drug:  Menthol (Topical Analgesic)  Apply 1 application topically 2 (two) times daily.     budesonide 0.25 MG/2ML nebulizer solution  Commonly known as:  PULMICORT  Take 2 mLs (0.25 mg total) by nebulization 2 (two) times daily.     cholecalciferol 1000 units tablet  Commonly known as:  VITAMIN D  Take 1,000 Units by mouth daily.     docusate sodium 100 MG capsule  Commonly known as:  COLACE  Take 100 mg by mouth 2 (two) times daily.     escitalopram 10 MG tablet  Commonly known as:  LEXAPRO  Take 10 mg by mouth daily.       feeding supplement (PRO-STAT SUGAR FREE 64) Liqd  Take 30 mLs by mouth 2 (two) times daily.     ferrous sulfate 325 (65 FE) MG tablet  Take 325 mg by mouth daily with breakfast.     fexofenadine 180 MG tablet  Commonly known as:  ALLEGRA  Take 180 mg by mouth daily.     ipratropium-albuterol 0.5-2.5 (3) MG/3ML Soln  Commonly known as:  DUONEB  Take 3 mLs by nebulization every 4 (four) hours as needed (SOB/wheezing).     LORazepam 0.5 MG tablet  Commonly known as:  ATIVAN  Take 1 tablet (0.5 mg total) by mouth every 8 (eight) hours.     mirtazapine 15 MG tablet  Commonly known as:  REMERON  Take 15 mg by mouth at bedtime.     MULTIVITAMIN ADULT PO  Take 1 tablet by mouth daily.     NAMENDA XR 28 MG Cp24 24 hr capsule  Generic drug:  memantine  Take 28 mg by mouth daily.     NON FORMULARY  Give medpass 4 oz bid with medpass d/t weight loss     oxyCODONE 5 MG immediate release tablet  Commonly known as:  Oxy IR/ROXICODONE  Take 1 tablet (5 mg total) by mouth every 6 (six) hours as needed for severe pain.     pantoprazole 40 MG tablet  Commonly known as:  PROTONIX  Take 1 tablet (40 mg total) by mouth 2 (two) times daily.     polyethylene glycol packet  Commonly known as:  MIRALAX / GLYCOLAX  Take 17 g by mouth daily. To be hold for diarrhea     Potassium Chloride ER 20 MEQ Tbcr  Take 1 tablet by mouth daily.     senna 8.6 MG tablet  Commonly known as:  SENOKOT  Take 1 tablet by mouth 2 (two) times daily.     traMADol 50 MG tablet  Commonly known as:  ULTRAM  Give 1 tablet by mouth every 8 hours scheduled for pain     vitamin C 500 MG tablet  Commonly known as:  ASCORBIC ACID  Take 500 mg by mouth 2 (two) times daily.     ZETIA 10 MG tablet  Generic drug:  ezetimibe  TAKE 1 TABLET DAILY        Review of Systems   General no complaints of fever chills she has gained some weight.  Skin does not complain of rashes or itching.  Head ears eyes nose  mouth throat does not complain of visual changes or eye irritation  Respiratory is not complaining of shortness breath or cough.  Cardiac no chest pain trace lower extremity edema.  GI  is not complaining of a nausea vomiting diarrhea constipation or abdominal pain.  GU does not complain of dysuria.  Muscle skeletal history of right humerus fracture but pain appears to be controlled with tramadol she also at times will complain of leg pain this comes and goes it appears.  Neurologic is not complaining of dizziness headache or syncopal-type episodes.  In psych history of dementia behaviors have significantly improved she is pleasant and cooperative nursing does not report yelling issues for some time now  Immunization History  Administered Date(s) Administered  . Influenza,inj,Quad PF,36+ Mos 04/16/2013, 05/06/2014  . Influenza-Unspecified 02/03/2015   Pertinent  Health Maintenance Due  Topic Date Due  . PNA vac Low Risk Adult (1 of 2 - PCV13) 08/04/2016 (Originally 01/07/1996)  . INFLUENZA VACCINE  12/07/2015  . DEXA SCAN  Completed   Fall Risk  05/06/2014 04/16/2013  Falls in the past year? Yes Yes  Number falls in past yr: 2 or more 1  Injury with Fall? - No  Risk Factor Category  High Fall Risk -  Risk for fall due to : History of fall(s);Impaired balance/gait -   Functional Status Survey:    Filed Vitals:   09/30/15 1449  BP: 106/54  Pulse: 74  Temp: 97.7 F (36.5 C)  TempSrc: Oral  Resp: 20  Height:  (1.626 m)  Weight: 112 lb (50.803 kg)   Body mass index is 19.22 kg/(m^2). Physical Exam   In general this is a pleasant frail elderly female in no distress sitting comfortably in her wheelchair.  Skin is warm and dry.  Eyes she does have prescription lenses pupils appear reactive to light visual acuity appears grossly intact.  Oropharynx clear mucous membranes moist.  Chest is clear to auscultation there is no labored breathing.  Heart is regular rate  and rhythm without murmur gallop or rub she has trace lower extremity edema.  Abdomen soft nontender positive bowel sounds.  Muscle skeletal is able to move all extremities 4 but has quite limited range of motion of her right arm status post humerus fracture she has arthritic changes of her legs bilaterally although there is not any acute pain with passive range of motion at the knees bilaterally.  Neurologic is grossly intact speech is clear no lateralizing findings.  Psych she is oriented to self staff does not report any recent behavioral issues she is pleasant and cooperative does follow simple verbal commands  Labs reviewed:  08/26/2015.  Sodium 137 potassium 4.1 BUN 34 creatinine 0.74.  Liver function tests within normal limits.  Cholesterol 150-triglycerides 145-HDL 44-LDL 77.  WBC 7.0 hemoglobin 12.0 platelets 185  #1 congestive heart failure this appears stable currently not on a diuretic she is on atenolol.  Dementia this appears to be stable she is on Namenda also has routine Ativan for anxiety which at one point was a significant issue but this appears to have improved significantly.  #3 history of osteoporosis she is on vitamin D will order a BMP to get a calcium level she may need additional calcium.  #3 history of anemia with iron deficiency this is been stable with a hemoglobin of 12.0 on lab done late last month.  #4 history failure to thrive again she has gained about 3 pounds over the past couple months this appears to be stable she is on Remeron-she is doing better at one point this was a significant history.  #5 history of hypertension this appears stable on Norvasc and atenolol recent blood  pressure 106/54.  #6-history hyperlipidemia she is on Zetia as well as atorvastatin liver function tests were within normal limits on lab done in late April-LDL was 77-at this point will monitor this appears to be stable.  #7-history of CVA she appears to have minimal  deficits here she is on aspirin and a statin.  #8 history of respiratory failure this has not really been initially for some time now she is on Pulmicort also on albuterol nebulizers as needed.  #9 history of GERD she is on a proton pump inhibitor this has been stable as well again she is gaining weight which is encouraging.  #10 history of depression this appears stable on Lexapro she is pleasant and interactive this is usually the case when I see her.  #11 history of pain management again she is on tramadol with history of osteoarthritic pain she does have OXYi IR as well as every 6 hours for breakthrough pain  [id number]-of note greater than 35 minutes spent assessing patient-discussing her status with nursing staff-reviewing her chart-her labs-and coordinating and formulating a plan of care for numerous diagnoses-of note greater than 50% of time spent coordinating plan of care  Recent Labs  11/02/14 0901 11/03/14 0407 11/04/14 0246 11/05/14 0225 11/06/14 0925 04/16/15  NA 147* 144 148* 145  --  136*  K 3.3* 3.7 3.5 3.0*  --  4.1  CL 119* 117* 115* 110  --   --   CO2 22 22 25 28   --   --   GLUCOSE 75 104* 98 96  --   --   BUN 41* 30* 22* 19  --  22*  CREATININE 0.84 0.77 0.73 0.67  --  0.6  CALCIUM 6.8* 6.9* 7.5* 7.7*  --   --   MG 2.3  --   --   --  1.6*  --     Recent Labs  11/01/14 1050 04/16/15  AST 45* 24  ALT 24 19  ALKPHOS 108 67  BILITOT 0.6  --   PROT 4.8*  --   ALBUMIN 1.3*  --     Recent Labs  11/01/14 1050  11/03/14 0407 11/04/14 0246 11/05/14 0225 04/16/15  WBC 15.9*  < > 12.1* 10.1 11.4* 5.5  NEUTROABS 13.7*  --   --   --   --   --   HGB 9.7*  < > 8.5* 8.6* 8.8* 10.3*  HCT 29.5*  < > 26.8* 27.6* 28.0* 32*  MCV 86.0  < > 87.3 87.1 86.7  --   PLT 592*  < > 453* 412* 391 174  < > = values in this interval not displayed. Lab Results  Component Value Date   TSH 0.79 10/16/2013   No results found for: HGBA1C Lab Results  Component Value Date    CHOL 125 03/08/2015   HDL 31* 03/08/2015   LDLCALC 76 03/08/2015   LDLDIRECT 190.1 05/06/2014   TRIG 89 03/08/2015   CHOLHDL 7 05/06/2014           London SheerLuster, Sally C, [place]MA [phone]

## 2015-10-06 LAB — BASIC METABOLIC PANEL
BUN: 25 mg/dL — AB (ref 4–21)
Creatinine: 0.5 mg/dL (ref 0.5–1.1)
Glucose: 96 mg/dL
Potassium: 4.6 mmol/L (ref 3.4–5.3)
Sodium: 139 mmol/L (ref 137–147)

## 2015-10-13 ENCOUNTER — Encounter: Payer: Self-pay | Admitting: Infectious Diseases

## 2015-10-13 ENCOUNTER — Ambulatory Visit (INDEPENDENT_AMBULATORY_CARE_PROVIDER_SITE_OTHER): Payer: Medicare Other | Admitting: Infectious Diseases

## 2015-10-13 VITALS — BP 128/78 | HR 63 | Temp 98.0°F

## 2015-10-13 DIAGNOSIS — L8992 Pressure ulcer of unspecified site, stage 2: Secondary | ICD-10-CM

## 2015-10-13 LAB — CBC
HEMATOCRIT: 37.5 % (ref 35.0–45.0)
Hemoglobin: 12.6 g/dL (ref 11.7–15.5)
MCH: 29.6 pg (ref 27.0–33.0)
MCHC: 33.6 g/dL (ref 32.0–36.0)
MCV: 88.2 fL (ref 80.0–100.0)
MPV: 9.5 fL (ref 7.5–12.5)
Platelets: 183 10*3/uL (ref 140–400)
RBC: 4.25 MIL/uL (ref 3.80–5.10)
RDW: 14.3 % (ref 11.0–15.0)
WBC: 10.3 10*3/uL (ref 3.8–10.8)

## 2015-10-13 MED ORDER — AMOXICILLIN-POT CLAVULANATE 600-42.9 MG/5ML PO SUSR: 600.0000 mg | Freq: Two times a day (BID) | ORAL | Status: DC

## 2015-10-13 NOTE — Progress Notes (Signed)
   Subjective:    Patient ID: Beverly Black, female    DOB: 12/25/1928, 82 y.o.   MRN: [id number]  HPI 82 yo F with hx of CVA 2011 (pt does not remember being in hospital), dementia, HTN, CHF, R arm fracture Beverly Black 2016 and since WC/bed bound. She is since been in SNF.  She gets turned by SNF staff.  Today comes to [facility] with hx of decubitus ulcer.  She had MRI 08-2015 showing:  Skin ulceration superficial to and just below the lower coccygeal segment, with suspicion for early osteomyelitis of the lower coccygeal segment given the indistinctness of marginal cortication on image 23/9 as well as the surrounding soft tissue edema. There is also presacral edema extending anterior to the sacrum and coccyx, without a drainable abscess.  She has not noted wound d/c.   She has had sacral wounds since July 2016. Has had local wound care.  Has gotten no atbx during this period.  Her last alb was 1.6 on 10-2014.  Her with daughter who provides further hx.   The past medical history, family history and social history were reviewed/updated in EPIC Quit smoking 1 year ago.   Review of Systems  Constitutional: Negative for fever, chills and appetite change.  Gastrointestinal: Negative for diarrhea and constipation.  Genitourinary: Negative for difficulty urinating.  wears diaper.     Objective:   Physical Exam  Constitutional: She appears well-developed and well-nourished.  HENT:  Mouth/Throat: No oropharyngeal exudate.  Eyes: EOM are normal. Pupils are equal, round, and reactive to light.  Neck: Neck supple.  Cardiovascular: Normal rate, regular rhythm and normal heart sounds.   Pulmonary/Chest: Effort normal and breath sounds normal.  Abdominal: Soft. Bowel sounds are normal. There is no tenderness. There is no rebound.  Musculoskeletal: She exhibits no edema.       Legs: Lymphadenopathy:    She has no cervical adenopathy.  Neurological:  Mental status- + person, + city, - place, date is  Beverly Black 19XX      Assessment & Plan:

## 2015-10-13 NOTE — Assessment & Plan Note (Signed)
Will start her on augmentin for 6 weeks.  Will check her ESR and CRP Will check her albumin- nutrition eval.  She has air flow bed.  She needs scheduled turning She needs scheduled wound care.  Will see her back in 1 month

## 2015-10-14 LAB — COMPREHENSIVE METABOLIC PANEL
ALBUMIN: 3.8 g/dL (ref 3.6–5.1)
ALK PHOS: 81 U/L (ref 33–130)
ALT: 16 U/L (ref 6–29)
AST: 17 U/L (ref 10–35)
BUN: 29 mg/dL — ABNORMAL HIGH (ref 7–25)
CALCIUM: 9.1 mg/dL (ref 8.6–10.4)
CO2: 22 mmol/L (ref 20–31)
Chloride: 102 mmol/L (ref 98–110)
Creat: 0.79 mg/dL (ref 0.60–0.88)
Glucose, Bld: 118 mg/dL — ABNORMAL HIGH (ref 65–99)
POTASSIUM: 4.3 mmol/L (ref 3.5–5.3)
Sodium: 138 mmol/L (ref 135–146)
TOTAL PROTEIN: 6.3 g/dL (ref 6.1–8.1)
Total Bilirubin: 0.6 mg/dL (ref 0.2–1.2)

## 2015-10-14 LAB — ALBUMIN: Albumin: 3.8 g/dL (ref 3.6–5.1)

## 2015-10-14 LAB — SEDIMENTATION RATE: SED RATE: 11 mm/h (ref 0–30)

## 2015-10-14 LAB — C-REACTIVE PROTEIN: CRP: <0.5 mg/dL (ref <0.60)

## 2015-12-01 ENCOUNTER — Ambulatory Visit (INDEPENDENT_AMBULATORY_CARE_PROVIDER_SITE_OTHER): Payer: Medicare Other | Admitting: Infectious Diseases

## 2015-12-01 ENCOUNTER — Encounter: Payer: Self-pay | Admitting: Infectious Diseases

## 2015-12-01 DIAGNOSIS — L8992 Pressure ulcer of unspecified site, stage 2: Secondary | ICD-10-CM | POA: Diagnosis not present

## 2015-12-01 NOTE — Progress Notes (Signed)
   Subjective:    Patient ID: Beverly Black, female    DOB: 1929/04/15, 82 y.o.   MRN: [id number]  HPI 82 yo F with hx of CVA 2011 (pt does not remember being in hospital), dementia, HTN, CHF, R arm fracture [place] 2016 and since WC/bed bound. She is since been in SNF.  She gets turned by SNF staff.  Today comes to [facility] with hx of decubitus ulcer.  She had MRI 08-2015 showing:  Skin ulceration superficial to and just below the lower coccygeal segment, with suspicion for early osteomyelitis of the lower coccygeal segment given the indistinctness of marginal cortication on image 23/9 as well as the surrounding soft tissue edema. There is also presacral edema extending anterior to the sacrum and coccyx, without a drainable abscess.   She has had sacral wounds since July 2016. Has had local wound care.  Has gotten no atbx during this period.  Her last alb was 1.6 on 10-2014.  She was seen in [facility], 2017 and started on augmentin.   She is feeling well today. She states her wound is "completely healed". She has no pain.  No f/c. Still at [facility]  No problems with her atbx- no rash, no diarrhea.    Review of Systems  Constitutional: Negative for appetite change and unexpected weight change.  Gastrointestinal: Negative for diarrhea.  Skin: Negative for rash.       Objective:   Physical Exam  Constitutional: She appears well-developed and well-nourished.  Musculoskeletal:       Legs: Vitals reviewed.         Assessment & Plan:

## 2015-12-01 NOTE — Assessment & Plan Note (Signed)
She is doing very well.  Her wound has healed.  Continue supportive measures at SNF- off loading, nutrition.  She can stop her atbx- she is currently at week 7.  She has not had ESR or CRP. Would not repeat those now without baseline.  Discussed repeat MRI with pt/family- will defer as residual changes can often be seen, pt asx. Will defer to SNF MD regarding repeat in 3-4 months Will see her back prn.

## 2015-12-02 ENCOUNTER — Encounter: Payer: Self-pay | Admitting: Internal Medicine

## 2015-12-02 ENCOUNTER — Non-Acute Institutional Stay (SKILLED_NURSING_FACILITY): Payer: Medicare Other | Admitting: Internal Medicine

## 2015-12-02 DIAGNOSIS — H109 Unspecified conjunctivitis: Secondary | ICD-10-CM

## 2015-12-02 DIAGNOSIS — I5032 Chronic diastolic (congestive) heart failure: Secondary | ICD-10-CM | POA: Diagnosis not present

## 2015-12-02 DIAGNOSIS — S42201D Unspecified fracture of upper end of right humerus, subsequent encounter for fracture with routine healing: Secondary | ICD-10-CM | POA: Diagnosis not present

## 2015-12-02 DIAGNOSIS — I10 Essential (primary) hypertension: Secondary | ICD-10-CM | POA: Diagnosis not present

## 2015-12-02 DIAGNOSIS — D509 Iron deficiency anemia, unspecified: Secondary | ICD-10-CM

## 2015-12-02 DIAGNOSIS — F0391 Unspecified dementia with behavioral disturbance: Secondary | ICD-10-CM

## 2015-12-02 NOTE — Progress Notes (Signed)
Location:   [facility] Room Number: 411/P Place of Service:  SNF [phone]) Provider:  Rica Koyanagi, MD  Patient Care Team: Sheliah Hatch, MD as PCP - General (Family Medicine)  Extended Emergency Contact Information Primary Emergency Contact: [name],[name] Address: [address]          [name] [address] [place] Home Phone: [phone] Relation: Daughter Secondary Emergency Contact: [name] [place] Home Phone: [phone] Relation: Daughter  Code Status:  Full Code Goals of care: Advanced Directive information Advanced Directives 12/02/2015  Does patient have an advance directive? Yes  Type of Advance Directive Out of facility DNR (pink MOST or yellow form)  Does patient want to make changes to advanced directive? No - Patient declined  Copy of advanced directive(s) in chart? Yes  Would patient like information on creating an advanced directive? -     Chief Complaint  Patient presents with  . Medical Management of Chronic Issues    Routine visit  For medical management of chronic medical conditions including CHF-dementia-anemia-hypertension-history CVA-history of respiratory failure-failure to thrive in history of right humerus fracture  HPI:  Pt is a 82 y.o. female seen today for medical management of chronic diseases.  As noted above.  She continues to have a period of extended stability.  She had a period of failure thrive at this appears to be stabilized there eating and drinking fairly good per nursing. r she's gained about 8 pounds in the past  2-3 months  She has significant dementia who had significant behaviors as well as one point but this has been quite stable as well she is on Namenda.  She also on Ativan 3 times a day for anxiety.  He also has a history of a right humerus fracture has limited range of motion of her right shoulder area this has been managed conservatively  at one point had a sling but is no longer requiring this.  Her pain appears to be controlled in that respect as well.  Regards her history respiratory failure this is been stable with no recent exasperations noted she is also on when necessary nebulizers as well as Pulmicort.   She does have a history of iron deficiency anemia continues on iron hemoglobin in early Sof[place] was 12.6 which appears to be steadily improving we will update this.  She is sitting comfortably in her wheelchair today with no complaints again continues to do well with supportive care.  I do note her right eye does appear to be slightly erythematous although she does not complain of any pain or exudate with this  I also note she has seen Dr. Ninetta Lights of infectious disease recently for follow-up of a sacral ulcer-it appears this has largely resolved she did receive a course of Augmentin for concerns of early osteomyelitis    Past Medical History:  Diagnosis Date  . Aneurysm (HCC)    of the eye  . Decubitus ulcer of sacral region, stage 4 (HCC)   . Hyperlipidemia   . Hypertension   . Osteoporosis   . Stroke [facility]) 10/2008   Past Surgical History:  Procedure Laterality Date  . EYE SURGERY    . PARTIAL HYSTERECTOMY  1968    Allergies  Allergen Reactions  . Sulfa Antibiotics     Cant remember reaction      Medication List       Accurate as of 12/02/15 11:35 AM. Always use your most recent  med list.          amLODipine 10 MG tablet Commonly known as:  NORVASC TAKE 1 TABLET AT BEDTIME   aspirin 81 MG tablet Take 81 mg by mouth daily.   atenolol 50 MG tablet Commonly known as:  TENORMIN TAKE 1 TABLET TWICE A DAY   atorvastatin 40 MG tablet Commonly known as:  LIPITOR TAKE 1 TABLET DAILY   budesonide 0.25 MG/2ML nebulizer solution Commonly known as:  PULMICORT Take 2 mLs (0.25 mg total) by nebulization 2 (two) times daily.   cholecalciferol 1000 units tablet Commonly known as:  VITAMIN D Take  1,000 Units by mouth daily.   docusate sodium 100 MG capsule Commonly known as:  COLACE Take 100 mg by mouth 2 (two) times daily.   escitalopram 10 MG tablet Commonly known as:  LEXAPRO Take 10 mg by mouth daily.   feeding supplement (PRO-STAT SUGAR FREE 64) Liqd Take 30 mLs by mouth 2 (two) times daily.   ferrous sulfate 325 (65 FE) MG tablet Take 325 mg by mouth daily with breakfast.   fexofenadine 180 MG tablet Commonly known as:  ALLEGRA Take 180 mg by mouth daily.   LORazepam 0.5 MG tablet Commonly known as:  ATIVAN Take 1 tablet (0.5 mg total) by mouth every 8 (eight) hours.   mirtazapine 15 MG tablet Commonly known as:  REMERON Take 15 mg by mouth at bedtime.   MULTIVITAMIN ADULT PO Take 1 tablet by mouth daily.   NAMENDA XR 28 MG Cp24 24 hr capsule Generic drug:  memantine Take 28 mg by mouth daily.   oxyCODONE 5 MG immediate release tablet Commonly known as:  Oxy IR/ROXICODONE Take 1 tablet (5 mg total) by mouth every 6 (six) hours as needed for severe pain.   pantoprazole 40 MG tablet Commonly known as:  PROTONIX Take 1 tablet (40 mg total) by mouth 2 (two) times daily.   polyethylene glycol packet Commonly known as:  MIRALAX / GLYCOLAX Take 17 g by mouth daily. To be hold for diarrhea   Potassium Chloride ER 20 MEQ Tbcr Take 1 tablet by mouth daily.   senna 8.6 MG tablet Commonly known as:  SENOKOT Take 1 tablet by mouth 2 (two) times daily.   traMADol 50 MG tablet Commonly known as:  ULTRAM Give 1 tablet by mouth every 8 hours scheduled for pain   vitamin C 500 MG tablet Commonly known as:  ASCORBIC ACID Take 500 mg by mouth 2 (two) times daily.   ZETIA 10 MG tablet Generic drug:  ezetimibe TAKE 1 TABLET DAILY       Review of Systems    General no complaints of fever chills she has gained some weight.  Skin does not complain of rashes or itching.  Head ears eyes nose mouth throat does not complain of visual changes or eye  irritation but appears her right eye is somewhat erythematous  Respiratory is not complaining of shortness breath or cough.  Cardiac no chest pain or lower extremity edema.  GI is not complaining of a nausea vomiting diarrhea constipation or abdominal pain.  GU does not complain of dysuria.  Muscle skeletal history of right humerus fracture but pain appears to be controlled with tramadol she also at times will complain of leg pain this comes and goes it appears and is not complaining of that today.  Neurologic is not complaining of dizziness headache or syncopal-type episodes.   psych history of dementia behaviors have significantly improved she  is pleasant and cooperative   Immunization History  Administered Date(s) Administered  . Influenza,inj,Quad PF,36+ Mos 04/16/2013, 05/06/2014  . Influenza-Unspecified 02/03/2015   Pertinent  Health Maintenance Due  Topic Date Due  . PNA vac Low Risk Adult (1 of 2 - PCV13) 08/04/2016 (Originally 01/07/1996)  . INFLUENZA VACCINE  12/07/2015  . DEXA SCAN  Completed   Fall Risk  05/06/2014 04/16/2013  Falls in the past year? Yes Yes  Number falls in past yr: 2 or more 1  Injury with Fall? - No  Risk Factor Category  High Fall Risk -  Risk for fall due to : History of fall(s);Impaired balance/gait -   Functional Status Survey:    Vitals:   12/02/15 1119  BP: 133/62  Pulse: 70  Resp: 18  Temp: 97.4 F (36.3 C)  TempSrc: Oral  Weight: 120 lb 12.8 oz (54.8 kg)   Body mass index is 20.74 kg/m. Physical Exam   In general this is a pleasant frail elderly female in no distress sitting comfortably in her wheelchair.  Skin is warm and dry.  Eyes she does have prescription lenses pupils appear reactive to light visual acuity appears grossly intact. Has some mild erythema noted for right conjunctivae do not see any exudate  Oropharynx clear mucous membranes moist.  Chest is clear to auscultation there is no labored  breathing.  Heart is regular rate and rhythm without murmur gallop or rub she has trace lower extremity edema.  Abdomen soft nontender positive bowel sounds.  Muscle skeletal is able to move all extremities 4 but has quite limited range of motion of her right arm status post humerus fracture she has arthritic changes of her legs bilaterally although there is not any acute pain with passive range of motion at the knees bilaterally.  Neurologic is grossly intact speech is clear no lateralizing findings.  Psych she is oriented to self staff does not report any recent behavioral issues she is pleasant and cooperative does follow simple verbal commands  Labs reviewed:  Recent Labs  04/16/15 09/03/15 10/13/15 1505  NA 136* 137 138  K 4.1 4.1 4.3  CL  --   --  102  CO2  --   --  22  GLUCOSE  --   --  118*  BUN 22* 34* 29*  CREATININE 0.6 0.7 0.79  CALCIUM  --   --  9.1    Recent Labs  04/16/15 09/03/15 10/13/15 1505  AST 24 15 17   ALT 19 14 16   ALKPHOS 67 73 81  BILITOT  --   --  0.6  PROT  --   --  6.3  ALBUMIN  --   --  3.8  3.8    Recent Labs  04/16/15 10/13/15 1505  WBC 5.5 10.3  HGB 10.3* 12.6  HCT 32* 37.5  MCV  --  88.2  PLT 174 183   Lab Results  Component Value Date   TSH 0.79 10/16/2013   No results found for: HGBA1C Lab Results  Component Value Date   CHOL 150 09/03/2015   HDL 44 09/03/2015   LDLCALC 77 09/03/2015   LDLDIRECT 190.1 05/06/2014   TRIG 145 09/03/2015   CHOLHDL 7 05/06/2014    Significant Diagnostic Results in last 30 days:  No results found.  Assessment/Plan    #1 congestive heart failure this appears stable currently not on a diuretic she is on atenolol.  Dementia this appears to be stable she is on Namenda also  has routine Ativan for anxiety which at one point was a significant issue but this appears to have improved significantly.  #3 history of osteoporosis she is on vitamin D calcium level was 9.1 on lab done  last month  #3 history of anemia with iron deficiency this is been stable with a hemoglobin of 12.6 . on lab done l last month.  #4 history failure to thrive again she has gained about 8 pounds over the past couple months this appears to be stable she is on Remeron-she is doing better at one point this was a significantIssue  #5 history of hypertension this appears stable on Norvasc and atenolol recent blood pressure appears stable 133/62--  #6-history hyperlipidemia she is on Zetia as well as atorvastatin liver function tests were within normal limits on lab done in late April-LDL was 77-at this point will monitor this appears to be stable.  #7-history of CVA she appears to have minimal deficits here she is on aspirin and a statin.  #8 history of respiratory failure this has not really been initially for some time now she is on Pulmicort also on albuterol nebulizers as needed.  #9 history of GERD she is on a proton pump inhibitor this has been stable continues to gain  weight which is encouraging.  #10 history of depression this appears stable on Lexapro she is pleasant and interactive this is usually the case when I see her.  #11 history of pain management again she is on tramadol with history of osteoarthritic pain she does have OXYi IR as well as every 6 hours for breakthrough pain-  #12 conjunctivitis she has had similar presentations before and appears to have responded more to an antibiotic eyedrop rather than allergic eyedrop will start Cipro ophthalmologic solution 0.3% 2 drops 3 times a day for 7 days and monitor  [id number]-of note greater than 35 minutes spent assessing patient--reviewing her chart-her labs-and coordinating and formulating a plan of care for numerous diagnoses-of note greater than 50% of time spent coordinating plan of care

## 2015-12-15 ENCOUNTER — Non-Acute Institutional Stay (SKILLED_NURSING_FACILITY): Payer: Medicare Other | Admitting: Internal Medicine

## 2015-12-15 ENCOUNTER — Encounter: Payer: Self-pay | Admitting: Internal Medicine

## 2015-12-15 DIAGNOSIS — F329 Major depressive disorder, single episode, unspecified: Secondary | ICD-10-CM

## 2015-12-15 DIAGNOSIS — E785 Hyperlipidemia, unspecified: Secondary | ICD-10-CM

## 2015-12-15 DIAGNOSIS — F32A Depression, unspecified: Secondary | ICD-10-CM

## 2015-12-15 DIAGNOSIS — L8992 Pressure ulcer of unspecified site, stage 2: Secondary | ICD-10-CM

## 2015-12-15 NOTE — Progress Notes (Signed)
MRN: [id number] Name: Beverly Black  Sex: female Age: 82 y.o. DOB: 12/18/1928  PSC #:  Facility/Room: [facility] / 411 P Level Of Care: SNF Provider: Randon GoldsmithAnne D. Lyn HollingsheadAlexander, MD Emergency Contacts: Extended Emergency Contact Information Primary Emergency Contact: [name],[name] Address: [address]          [place] [id number] [place] Home Phone: [phone] Relation: Daughter Secondary Emergency Contact: [name],[name]  [place] Home Phone: [phone] Relation: Daughter  Code Status: Full Code  Allergies: Sulfa antibiotics  Chief Complaint  Patient presents with  . Medical Management of Chronic Issues    Routine Visit    HPI: Patient is 82 y.o. female who s being seen for routine issues of sacral wound, depression and HLD.   Past Medical History:  Diagnosis Date  . Aneurysm (HCC)    of the eye  . Decubitus ulcer of sacral region, stage 4 (HCC)   . Hyperlipidemia   . Hypertension   . Osteoporosis   . Stroke [facility](HCC) 10/2008    Past Surgical History:  Procedure Laterality Date  . EYE SURGERY    . PARTIAL HYSTERECTOMY  1968      Medication List       Accurate as of 12/15/15 11:59 PM. Always use your most recent med list.          amLODipine 10 MG tablet Commonly known as:  NORVASC TAKE 1 TABLET AT BEDTIME   aspirin 81 MG chewable tablet Chew 81 mg by mouth daily.   atenolol 50 MG tablet Commonly known as:  TENORMIN TAKE 1 TABLET TWICE A DAY   atorvastatin 40 MG tablet Commonly known as:  LIPITOR TAKE 1 TABLET DAILY   budesonide 0.25 MG/2ML nebulizer solution Commonly known as:  PULMICORT Take 2 mLs (0.25 mg total) by nebulization 2 (two) times daily.   cholecalciferol 1000 units tablet Commonly known as:  VITAMIN D Take 1,000 Units by mouth daily.   docusate sodium 100 MG capsule Commonly known as:  COLACE Take 100 mg by mouth 2 (two) times daily.   escitalopram 10 MG tablet Commonly known as:   LEXAPRO Take 10 mg by mouth daily.   feeding supplement (PRO-STAT SUGAR FREE 64) Liqd Take 30 mLs by mouth 2 (two) times daily.   ferrous sulfate 325 (65 FE) MG tablet Take 325 mg by mouth daily with breakfast.   fexofenadine 180 MG tablet Commonly known as:  ALLEGRA Take 180 mg by mouth daily.   LORazepam 0.5 MG tablet Commonly known as:  ATIVAN Take 1 tablet (0.5 mg total) by mouth every 8 (eight) hours.   mirtazapine 15 MG tablet Commonly known as:  REMERON Take 15 mg by mouth at bedtime.   MULTIVITAMIN ADULT PO Take 1 tablet by mouth daily.   NAMENDA XR 28 MG Cp24 24 hr capsule Generic drug:  memantine Take 28 mg by mouth daily.   NUTRITIONAL SUPPLEMENT PO Give Medpass 4 oz by mouth twice daily   oxyCODONE 5 MG immediate release tablet Commonly known as:  Oxy IR/ROXICODONE Take 1 tablet (5 mg total) by mouth every 6 (six) hours as needed for severe pain.   pantoprazole 40 MG tablet Commonly known as:  PROTONIX Take 1 tablet (40 mg total) by mouth 2 (two) times daily.   polyethylene glycol packet Commonly known as:  MIRALAX / GLYCOLAX Take 17 g by mouth daily. To be hold for diarrhea   Potassium Chloride ER 20 MEQ Tbcr Take 1 tablet  by mouth daily.   sennosides-docusate sodium 8.6-50 MG tablet Commonly known as:  SENOKOT-S Take 1 tablet by mouth 2 (two) times daily.   traMADol 50 MG tablet Commonly known as:  ULTRAM Give 1 tablet by mouth every 8 hours scheduled for pain   vitamin C 500 MG tablet Commonly known as:  ASCORBIC ACID Take 500 mg by mouth 2 (two) times daily.   ZETIA 10 MG tablet Generic drug:  ezetimibe TAKE 1 TABLET DAILY       Meds ordered this encounter  Medications  . aspirin 81 MG chewable tablet    Sig: Chew 81 mg by mouth daily.  . sennosides-docusate sodium (SENOKOT-S) 8.6-50 MG tablet    Sig: Take 1 tablet by mouth 2 (two) times daily.  . Nutritional Supplements (NUTRITIONAL SUPPLEMENT PO)    Sig: Give Medpass 4 oz by  mouth twice daily    Immunization History  Administered Date(s) Administered  . Influenza,inj,Quad PF,36+ Mos 04/16/2013, 05/06/2014  . Influenza-Unspecified 02/03/2015  . PPD Test 10/14/2014    Social History  Substance Use Topics  . Smoking status: Former Smoker    Packs/day: 0.50    Years: 50.00  . Smokeless tobacco: Never Used  . Alcohol use No    Review of Systems  DATA OBTAINED: from nurse GENERAL:  no fevers, fatigue, appetite changes SKIN: No itching, rash HEENT: No complaint RESPIRATORY: No cough, wheezing, SOB CARDIAC: No chest pain, palpitations, lower extremity edema  GI: No abdominal pain, No N/V/D or constipation, No heartburn or reflux  GU: No dysuria, frequency or urgency, or incontinence  MUSCULOSKELETAL: No unrelieved bone/joint pain NEUROLOGIC: No headache, dizziness  PSYCHIATRIC: No overt anxiety or sadness  Vitals:   12/15/15 1124  BP: (!) 145/72  Pulse: 61  Resp: 16  Temp: 98.3 F (36.8 C)    Physical Exam  GENERAL APPEARANCE: Alert,No acute distress  SKIN: No diaphoresis rash HEENT: Unremarkable RESPIRATORY: Breathing is even, unlabored. Lung sounds are clear   CARDIOVASCULAR: Heart RRR no murmurs, rubs or gallops. No peripheral edema  GASTROINTESTINAL: Abdomen is soft, non-tender, not distended w/ normal bowel sounds.  GENITOURINARY: Bladder non tender, not distended  MUSCULOSKELETAL: No abnormal joints or musculature NEUROLOGIC: Cranial nerves 2-12 grossly intact. Moves all extremities PSYCHIATRIC: Mood and affect appropriate to situation with dementia, no behavioral issues  Patient Active Problem List   Diagnosis Date Noted  . Depression 12/23/2015  . Chronic diastolic heart failure (HCC) 09/05/2015  . Right leg pain 08/05/2015  . COPD (chronic obstructive pulmonary disease) (HCC) 06/25/2015  . Loss of weight 01/21/2015  . Anemia 01/05/2015  . Acute upper respiratory infection 01/05/2015  . Dementia with behavioral disturbance  12/31/2014  . Iron deficiency anemia 12/10/2014  . Stomach discomfort 11/30/2014  . Subacute confusional state 11/30/2014  . Acute respiratory failure with hypoxia (HCC) 11/11/2014  . AKI (acute kidney injury) (HCC) 11/11/2014  . Hypernatremia 11/11/2014  . Hypokalemia 11/09/2014  . COPD exacerbation (HCC)   . Acute on chronic diastolic heart failure (HCC)   . Hypoxia   . SOB (shortness of breath)   . Pressure ulcer stage II 11/02/2014  . Palliative care encounter 11/02/2014  . HCAP (healthcare-associated pneumonia) 11/01/2014  . Leukocytosis 10/15/2014  . Closed fracture of part of upper end of humerus 10/14/2014  . Poor balance 11/25/2013  . History of stroke 11/25/2013  . 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 Myalgia 09/05/2012  . Disequilibrium 09/05/2012  . Hypertension, essential 09/05/2012  . 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  Mobility impaired 07/15/2012  . Physical exam, routine 12/07/2011  . Cerumen impaction 04/04/2011  . Trapezius muscle spasm 04/04/2011  . HTN (hypertension) 11/30/2010  . Hyperlipidemia 11/30/2010  . CVA (cerebral infarction) 11/30/2010  . Postmenopausal HRT (hormone replacement therapy) 11/30/2010  . Anxiety 11/30/2010  . Ankle weakness 11/30/2010    CBC    Component Value Date/Time   WBC 10.3 10/13/2015 1505   RBC 4.25 10/13/2015 1505   HGB 12.6 10/13/2015 1505   HCT 37.5 10/13/2015 1505   PLT 183 10/13/2015 1505   MCV 88.2 10/13/2015 1505   LYMPHSABS 1.1 11/01/2014 1050   MONOABS 1.1 (H) 11/01/2014 1050   EOSABS 0.0 11/01/2014 1050   BASOSABS 0.0 11/01/2014 1050    CMP     Component Value Date/Time   NA 138 10/13/2015 1505   NA 139 10/06/2015   K 4.3 10/13/2015 1505   CL 102 10/13/2015 1505   CO2 22 10/13/2015 1505   GLUCOSE 118 (H) 10/13/2015 1505   BUN 29 (H) 10/13/2015 1505   BUN 25 (A) 10/06/2015   CREATININE 0.79 10/13/2015 1505   CALCIUM 9.1 10/13/2015 1505   PROT 6.3 10/13/2015 1505   ALBUMIN 3.8  10/13/2015 1505   ALBUMIN 3.8 10/13/2015 1505   AST 17 10/13/2015 1505   ALT 16 10/13/2015 1505   ALKPHOS 81 10/13/2015 1505   BILITOT 0.6 10/13/2015 1505   GFRNONAA >60 11/05/2014 0225   GFRAA >60 11/05/2014 0225    Assessment and Plan  Pressure ulcer stage II Per ID wound has healed; pt was on 7 weeks of augmentin for osteo; ID rec f/u MRI in 3-4  If family desires  Depression No reports of instabilty; cont remeron 15 mg daily and lexapro 10 mg daily  Hyperlipidemia LDL - 77, HDL 44 on lipitor 40 mg and zetia 10 mg daily;plan - cont current meds   Ustin Cruickshank D. [name], MD

## 2015-12-23 ENCOUNTER — Encounter: Payer: Self-pay | Admitting: Internal Medicine

## 2015-12-23 DIAGNOSIS — F329 Major depressive disorder, single episode, unspecified: Secondary | ICD-10-CM | POA: Insufficient documentation

## 2015-12-23 DIAGNOSIS — F32A Depression, unspecified: Secondary | ICD-10-CM | POA: Insufficient documentation

## 2015-12-23 HISTORY — DX: Depression, unspecified: F32.A

## 2015-12-23 NOTE — Assessment & Plan Note (Signed)
LDL - 77, HDL 44 on lipitor 40 mg and zetia 10 mg daily;plan - cont current meds

## 2015-12-23 NOTE — Assessment & Plan Note (Signed)
Per ID wound has healed; pt was on 7 weeks of augmentin for osteo; ID rec f/u MRI in 3-4  If family desires

## 2015-12-23 NOTE — Assessment & Plan Note (Signed)
No reports of instabilty; cont remeron 15 mg daily and lexapro 10 mg daily

## 2016-01-12 ENCOUNTER — Non-Acute Institutional Stay (SKILLED_NURSING_FACILITY): Payer: Medicare Other | Admitting: Internal Medicine

## 2016-01-12 ENCOUNTER — Encounter: Payer: Self-pay | Admitting: Internal Medicine

## 2016-01-12 DIAGNOSIS — H109 Unspecified conjunctivitis: Secondary | ICD-10-CM | POA: Diagnosis not present

## 2016-01-12 NOTE — Progress Notes (Signed)
MRN: [id number] Name: Beverly Black  Sex: female Age: 82 y.o. DOB: Feb 07, 1929  PSC #:  Facility/Room: [facility] / [facility] Level Of Care: SNF Provider: [name]. [name], MD Emergency Contacts: Extended Emergency Contact Information Primary Emergency Contact: [name],[name] Address: [address]          [name] [address] [place] Home Phone: [phone] Relation: Daughter Secondary Emergency Contact: [name] [place] Home Phone: [phone] Relation: Daughter  Code Status: DNR  Allergies: Sulfa antibiotics  Chief Complaint  Patient presents with  . Acute Visit    Acute    HPI: Patient is 82 y.o. female who nursing asked me to see for a red R eye with sticky drainage for 2 days; no colds or coughs, fever.  Past Medical History:  Diagnosis Date  . Aneurysm (HCC)    of the eye  . Decubitus ulcer of sacral region, stage 4 (HCC)   . Hyperlipidemia   . Hypertension   . Osteoporosis   . Stroke [facility]) 10/2008    Past Surgical History:  Procedure Laterality Date  . EYE SURGERY    . PARTIAL HYSTERECTOMY  1968      Medication List       Accurate as of 01/12/16  2:04 PM. Always use your most recent med list.          amLODipine 10 MG tablet Commonly known as:  NORVASC TAKE 1 TABLET AT BEDTIME   aspirin 81 MG chewable tablet Chew 81 mg by mouth daily.   atenolol 50 MG tablet Commonly known as:  TENORMIN TAKE 1 TABLET TWICE A DAY   atorvastatin 40 MG tablet Commonly known as:  LIPITOR TAKE 1 TABLET DAILY   budesonide 0.25 MG/2ML nebulizer solution Commonly known as:  PULMICORT Take 2 mLs (0.25 mg total) by nebulization 2 (two) times daily.   cholecalciferol 1000 units tablet Commonly known as:  VITAMIN D Take 1,000 Units by mouth daily.   docusate sodium 100 MG capsule Commonly known as:  COLACE Take 100 mg by mouth 2 (two) times daily.   escitalopram 10 MG tablet Commonly known as:  LEXAPRO Take 10  mg by mouth daily.   feeding supplement (PRO-STAT SUGAR FREE 64) Liqd Take 30 mLs by mouth 2 (two) times daily.   ferrous sulfate 325 (65 FE) MG tablet Take 325 mg by mouth daily with breakfast.   fexofenadine 180 MG tablet Commonly known as:  ALLEGRA Take 180 mg by mouth daily.   LORazepam 0.5 MG tablet Commonly known as:  ATIVAN Take 1 tablet (0.5 mg total) by mouth every 8 (eight) hours.   mirtazapine 15 MG tablet Commonly known as:  REMERON Take 15 mg by mouth at bedtime.   MULTIVITAMIN ADULT PO Take 1 tablet by mouth daily.   NAMENDA XR 28 MG Cp24 24 hr capsule Generic drug:  memantine Take 28 mg by mouth daily.   NUTRITIONAL SUPPLEMENT PO Give Medpass 4 oz by mouth twice daily   oxyCODONE 5 MG immediate release tablet Commonly known as:  Oxy IR/ROXICODONE Take 1 tablet (5 mg total) by mouth every 6 (six) hours as needed for severe pain.   pantoprazole 40 MG tablet Commonly known as:  PROTONIX Take 1 tablet (40 mg total) by mouth 2 (two) times daily.   polyethylene glycol packet Commonly known as:  MIRALAX / GLYCOLAX Take 17 g by mouth daily. To be hold for diarrhea   Potassium Chloride ER 20 MEQ  Tbcr Take 1 tablet by mouth daily.   sennosides-docusate sodium 8.6-50 MG tablet Commonly known as:  SENOKOT-S Take 1 tablet by mouth 2 (two) times daily.   traMADol 50 MG tablet Commonly known as:  ULTRAM Give 1 tablet by mouth every 8 hours scheduled for pain   vitamin C 500 MG tablet Commonly known as:  ASCORBIC ACID Take 500 mg by mouth 2 (two) times daily.   ZETIA 10 MG tablet Generic drug:  ezetimibe TAKE 1 TABLET DAILY       No orders of the defined types were placed in this encounter.   Immunization History  Administered Date(s) Administered  . Influenza,inj,Quad PF,36+ Mos 04/16/2013, 05/06/2014  . Influenza-Unspecified 02/03/2015  . PPD Test 10/14/2014    Social History  Substance Use Topics  . Smoking status: Former Smoker     Packs/day: 0.50    Years: 50.00  . Smokeless tobacco: Never Used  . Alcohol use No    Review of Systems  DATA OBTAINED: from patient, nurse GENERAL:  no fevers, fatigue, appetite changes SKIN: No itching, rash HEENT:R eye red  With d/c esp in am RESPIRATORY: No cough, wheezing, SOB CARDIAC: No chest pain, palpitations, lower extremity edema  GI: No abdominal pain, No N/V/D or constipation, No heartburn or reflux  GU: No dysuria, frequency or urgency, or incontinence  MUSCULOSKELETAL: No unrelieved bone/joint pain NEUROLOGIC: No headache, dizziness  PSYCHIATRIC: No overt anxiety or sadness  Vitals:   01/12/16 1402  BP: (!) 145/72  Pulse: 64  Resp: 18  Temp: 98.3 F (36.8 C)    Physical Exam  GENERAL APPEARANCE: Alert, conversant, No acute distress  SKIN: No diaphoresis rash HEENT: R conjuntiva is red with small amt cloudy d/c RESPIRATORY: Breathing is even, unlabored. Lung sounds are clear   CARDIOVASCULAR: Heart RRR no murmurs, rubs or gallops. No peripheral edema  GASTROINTESTINAL: Abdomen is soft, non-tender, not distended w/ normal bowel sounds.  GENITOURINARY: Bladder non tender, not distended  MUSCULOSKELETAL: No abnormal joints or musculature NEUROLOGIC: Cranial nerves 2-12 grossly intact. Moves all extremities PSYCHIATRIC: Mood and affect appropriate to situation with dementia, no behavioral issues  Patient Active Problem List   Diagnosis Date Noted  . Depression 12/23/2015  . Chronic diastolic heart failure (HCC) 09/05/2015  . Right leg pain 08/05/2015  . COPD (chronic obstructive pulmonary disease) (HCC) 06/25/2015  . Loss of weight 01/21/2015  . Anemia 01/05/2015  . Acute upper respiratory infection 01/05/2015  . Dementia with behavioral disturbance 12/31/2014  . Iron deficiency anemia 12/10/2014  . Stomach discomfort 11/30/2014  . Subacute confusional state 11/30/2014  . Acute respiratory failure with hypoxia (HCC) 11/11/2014  . AKI (acute kidney  injury) (HCC) 11/11/2014  . Hypernatremia 11/11/2014  . Hypokalemia 11/09/2014  . COPD exacerbation (HCC)   . Acute on chronic diastolic heart failure (HCC)   . Hypoxia   . SOB (shortness of breath)   . Pressure ulcer stage II 11/02/2014  . Palliative care encounter 11/02/2014  . HCAP (healthcare-associated pneumonia) 11/01/2014  . Leukocytosis 10/15/2014  . Closed fracture of part of upper end of humerus 10/14/2014  . Poor balance 11/25/2013  . History of stroke 11/25/2013  . 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 Myalgia 09/05/2012  . Disequilibrium 09/05/2012  . Hypertension, essential 09/05/2012  . 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 Mobility impaired 07/15/2012  . Physical exam, routine 12/07/2011  . Cerumen impaction 04/04/2011  . Trapezius muscle spasm 04/04/2011  . HTN (hypertension) 11/30/2010  . Hyperlipidemia 11/30/2010  .  CVA (cerebral infarction) 11/30/2010  . Postmenopausal HRT (hormone replacement therapy) 11/30/2010  . Anxiety 11/30/2010  . Ankle weakness 11/30/2010    CBC    Component Value Date/Time   WBC 10.3 10/13/2015 1505   RBC 4.25 10/13/2015 1505   HGB 12.6 10/13/2015 1505   HCT 37.5 10/13/2015 1505   PLT 183 10/13/2015 1505   MCV 88.2 10/13/2015 1505   LYMPHSABS 1.1 11/01/2014 1050   MONOABS 1.1 (H) 11/01/2014 1050   EOSABS 0.0 11/01/2014 1050   BASOSABS 0.0 11/01/2014 1050    CMP     Component Value Date/Time   NA 138 10/13/2015 1505   NA 139 10/06/2015   K 4.3 10/13/2015 1505   CL 102 10/13/2015 1505   CO2 22 10/13/2015 1505   GLUCOSE 118 (H) 10/13/2015 1505   BUN 29 (H) 10/13/2015 1505   BUN 25 (A) 10/06/2015   CREATININE 0.79 10/13/2015 1505   CALCIUM 9.1 10/13/2015 1505   PROT 6.3 10/13/2015 1505   ALBUMIN 3.8 10/13/2015 1505   ALBUMIN 3.8 10/13/2015 1505   AST 17 10/13/2015 1505   ALT 16 10/13/2015 1505   ALKPHOS 81 10/13/2015 1505   BILITOT 0.6 10/13/2015 1505   GFRNONAA >60 11/05/2014 0225   GFRAA >60  11/05/2014 0225    Assessment and Plan  R CONJUNCTIVITIS- garamycin ointment, 1/2 inch to lower lid QID for 10 days.    Randon GoldsmithAnne D. Lyn HollingsheadAlexander, MD

## 2016-01-13 ENCOUNTER — Encounter: Payer: Self-pay | Admitting: Internal Medicine

## 2016-01-14 ENCOUNTER — Non-Acute Institutional Stay (SKILLED_NURSING_FACILITY): Payer: Medicare Other | Admitting: Internal Medicine

## 2016-01-14 ENCOUNTER — Encounter: Payer: Self-pay | Admitting: Internal Medicine

## 2016-01-14 DIAGNOSIS — F419 Anxiety disorder, unspecified: Secondary | ICD-10-CM | POA: Diagnosis not present

## 2016-01-14 DIAGNOSIS — D509 Iron deficiency anemia, unspecified: Secondary | ICD-10-CM

## 2016-01-14 DIAGNOSIS — I110 Hypertensive heart disease with heart failure: Secondary | ICD-10-CM

## 2016-01-14 NOTE — Progress Notes (Signed)
MRN: [id number] Name: Beverly Black  Sex: female Age: 82 y.o. DOB: 04/25/1929  PSC #:  Facility/Room: [facility] / [facility] Level Of Care: SNF Provider: Randon GoldsmithAnne D. Lyn HollingsheadAlexander, MD Emergency Contacts: Extended Emergency Contact Information Primary Emergency Contact: [name],[name] Address: [address]          [place] [id number] [place] Home Phone: [phone] Relation: Daughter Secondary Emergency Contact: [name],[name]  [place] Home Phone: [phone] Relation: Daughter  Code Status: DNR  Allergies: Sulfa antibiotics  Chief Complaint  Patient presents with  . Medical Management of Chronic Issues    Routine Visit    HPI: Patient is 82 y.o. female who is being seen for routine issues of HTN, anxiety and anemia.  Past Medical History:  Diagnosis Date  . Aneurysm (HCC)    of the eye  . Decubitus ulcer of sacral region, stage 4 (HCC)   . Hyperlipidemia   . Hypertension   . Osteoporosis   . Stroke [facility](HCC) 10/2008    Past Surgical History:  Procedure Laterality Date  . EYE SURGERY    . PARTIAL HYSTERECTOMY  1968      Medication List       Accurate as of 01/14/16 11:59 PM. Always use your most recent med list.          amLODipine 10 MG tablet Commonly known as:  NORVASC TAKE 1 TABLET AT BEDTIME   aspirin 81 MG chewable tablet Chew 81 mg by mouth daily.   atenolol 50 MG tablet Commonly known as:  TENORMIN TAKE 1 TABLET TWICE A DAY   atorvastatin 40 MG tablet Commonly known as:  LIPITOR TAKE 1 TABLET DAILY   budesonide 0.25 MG/2ML nebulizer solution Commonly known as:  PULMICORT Take 2 mLs (0.25 mg total) by nebulization 2 (two) times daily.   cholecalciferol 1000 units tablet Commonly known as:  VITAMIN D Take 1,000 Units by mouth daily.   docusate sodium 100 MG capsule Commonly known as:  COLACE Take 100 mg by mouth 2 (two) times daily.   escitalopram 10 MG tablet Commonly known as:  LEXAPRO Take 10 mg by  mouth daily.   feeding supplement (PRO-STAT SUGAR FREE 64) Liqd Take 30 mLs by mouth 2 (two) times daily.   ferrous sulfate 325 (65 FE) MG tablet Take 325 mg by mouth daily with breakfast.   fexofenadine 180 MG tablet Commonly known as:  ALLEGRA Take 180 mg by mouth daily.   LORazepam 0.5 MG tablet Commonly known as:  ATIVAN Take 1 tablet (0.5 mg total) by mouth every 8 (eight) hours.   mirtazapine 15 MG tablet Commonly known as:  REMERON Take 15 mg by mouth at bedtime.   MULTIVITAMIN ADULT PO Take 1 tablet by mouth daily.   NAMENDA XR 28 MG Cp24 24 hr capsule Generic drug:  memantine Take 28 mg by mouth daily.   NUTRITIONAL SUPPLEMENT PO Give Medpass 4 oz by mouth twice daily   oxyCODONE 5 MG immediate release tablet Commonly known as:  Oxy IR/ROXICODONE Take 1 tablet (5 mg total) by mouth every 6 (six) hours as needed for severe pain.   pantoprazole 40 MG tablet Commonly known as:  PROTONIX Take 1 tablet (40 mg total) by mouth 2 (two) times daily.   polyethylene glycol packet Commonly known as:  MIRALAX / GLYCOLAX Take 17 g by mouth daily. To be hold for diarrhea   Potassium Chloride ER 20 MEQ Tbcr Take 1 tablet by mouth daily.  sennosides-docusate sodium 8.6-50 MG tablet Commonly known as:  SENOKOT-S Take 1 tablet by mouth 2 (two) times daily.   traMADol 50 MG tablet Commonly known as:  ULTRAM Give 1 tablet by mouth every 8 hours scheduled for pain   vitamin C 500 MG tablet Commonly known as:  ASCORBIC ACID Take 500 mg by mouth 2 (two) times daily.   ZETIA 10 MG tablet Generic drug:  ezetimibe TAKE 1 TABLET DAILY       No orders of the defined types were placed in this encounter.   Immunization History  Administered Date(s) Administered  . Influenza,inj,Quad PF,36+ Mos 04/16/2013, 05/06/2014  . Influenza-Unspecified 02/03/2015  . PPD Test 10/14/2014    Social History  Substance Use Topics  . Smoking status: Former Smoker    Packs/day:  0.50    Years: 50.00  . Smokeless tobacco: Never Used  . Alcohol use No    Review of Systems  DATA OBTAINED: from nurse; pt can not fully participate GENERAL:  no fevers, fatigue, appetite changes SKIN: No itching, rash HEENT:redness in R eye improved RESPIRATORY: No cough, wheezing, SOB CARDIAC: No chest pain, palpitations, lower extremity edema  GI: No abdominal pain, No N/V/D or constipation, No heartburn or reflux  GU: No dysuria, frequency or urgency, or incontinence  MUSCULOSKELETAL: No unrelieved bone/joint pain NEUROLOGIC: No headache, dizziness  PSYCHIATRIC: No overt anxiety or sadness  Vitals:   01/14/16 0931  BP: (!) 145/72  Pulse: 64  Resp: 18  Temp: 98.3 F (36.8 C)    Physical Exam  GENERAL APPEARANCE: Alert, conversant, No acute distress  SKIN: No diaphoresis rash HEENT:rednes in R eye improved RESPIRATORY: Breathing is even, unlabored. Lung sounds are clear   CARDIOVASCULAR: Heart RRR no murmurs, rubs or gallops. No peripheral edema  GASTROINTESTINAL: Abdomen is soft, non-tender, not distended w/ normal bowel sounds.  GENITOURINARY: Bladder non tender, not distended  MUSCULOSKELETAL: No abnormal joints or musculature NEUROLOGIC: Cranial nerves 2-12 grossly intact. Moves all extremities PSYCHIATRIC: Mood and affect appropriate to situation with dementia, no behavioral issues  Patient Active Problem List   Diagnosis Date Noted  . Depression 12/23/2015  . Chronic diastolic heart failure (HCC) 09/05/2015  . Right leg pain 08/05/2015  . COPD (chronic obstructive pulmonary disease) (HCC) 06/25/2015  . Loss of weight 01/21/2015  . Anemia, iron deficiency 01/05/2015  . Acute upper respiratory infection 01/05/2015  . Dementia with behavioral disturbance 12/31/2014  . Iron deficiency anemia 12/10/2014  . Stomach discomfort 11/30/2014  . Subacute confusional state 11/30/2014  . Acute respiratory failure with hypoxia (HCC) 11/11/2014  . AKI (acute kidney  injury) (HCC) 11/11/2014  . Hypernatremia 11/11/2014  . Hypokalemia 11/09/2014  . COPD exacerbation (HCC)   . Acute on chronic diastolic heart failure (HCC)   . Hypoxia   . SOB (shortness of breath)   . Pressure ulcer stage II 11/02/2014  . Palliative care encounter 11/02/2014  . HCAP (healthcare-associated pneumonia) 11/01/2014  . Leukocytosis 10/15/2014  . Closed fracture of part of upper end of humerus 10/14/2014  . Poor balance 11/25/2013  . History of stroke 11/25/2013  . 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 Myalgia 09/05/2012  . Disequilibrium 09/05/2012  . Hypertension, essential 09/05/2012  . 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 Mobility impaired 07/15/2012  . Physical exam, routine 12/07/2011  . Cerumen impaction 04/04/2011  . Trapezius muscle spasm 04/04/2011  . Hypertensive heart disease with CHF (congestive heart failure) (HCC) 11/30/2010  . Hyperlipidemia 11/30/2010  . CVA (cerebral infarction) 11/30/2010  .  Postmenopausal HRT (hormone replacement therapy) 11/30/2010  . Anxiety 11/30/2010  . Ankle weakness 11/30/2010    CBC    Component Value Date/Time   WBC 10.3 10/13/2015 1505   RBC 4.25 10/13/2015 1505   HGB 12.6 10/13/2015 1505   HCT 37.5 10/13/2015 1505   PLT 183 10/13/2015 1505   MCV 88.2 10/13/2015 1505   LYMPHSABS 1.1 11/01/2014 1050   MONOABS 1.1 (H) 11/01/2014 1050   EOSABS 0.0 11/01/2014 1050   BASOSABS 0.0 11/01/2014 1050    CMP     Component Value Date/Time   NA 138 10/13/2015 1505   NA 139 10/06/2015   K 4.3 10/13/2015 1505   CL 102 10/13/2015 1505   CO2 22 10/13/2015 1505   GLUCOSE 118 (H) 10/13/2015 1505   BUN 29 (H) 10/13/2015 1505   BUN 25 (A) 10/06/2015   CREATININE 0.79 10/13/2015 1505   CALCIUM 9.1 10/13/2015 1505   PROT 6.3 10/13/2015 1505   ALBUMIN 3.8 10/13/2015 1505   ALBUMIN 3.8 10/13/2015 1505   AST 17 10/13/2015 1505   ALT 16 10/13/2015 1505   ALKPHOS 81 10/13/2015 1505   BILITOT 0.6 10/13/2015 1505    GFRNONAA >60 11/05/2014 0225   GFRAA >60 11/05/2014 0225    Assessment and Plan  Hypertensive heart disease with CHF (congestive heart failure) (HCC) Today a little higher than usual but still within criteria for her age group;plan to cont atenolol 50 mg BID and norvasc 10 mg daily  Anxiety Controlled on ativan 0.5 mg q8 hrs; plan to cont current med  Anemia, iron deficiency Most recent Hb 12.6, improved from prior;plan to cont iron 325 mg daily   Omarrion Carmer D. [name], MD

## 2016-01-16 ENCOUNTER — Encounter: Payer: Self-pay | Admitting: Internal Medicine

## 2016-01-16 NOTE — Assessment & Plan Note (Signed)
Today a little higher than usual but still within criteria for her age group;plan to cont atenolol 50 mg BID and norvasc 10 mg daily

## 2016-01-16 NOTE — Assessment & Plan Note (Signed)
Controlled on ativan 0.5 mg q8 hrs; plan to cont current med

## 2016-01-16 NOTE — Assessment & Plan Note (Signed)
Most recent Hb 12.6, improved from prior;plan to cont iron 325 mg daily

## 2016-01-26 ENCOUNTER — Non-Acute Institutional Stay (SKILLED_NURSING_FACILITY): Payer: Medicare Other | Admitting: Internal Medicine

## 2016-01-26 ENCOUNTER — Encounter: Payer: Self-pay | Admitting: Internal Medicine

## 2016-01-26 DIAGNOSIS — H109 Unspecified conjunctivitis: Secondary | ICD-10-CM

## 2016-01-26 NOTE — Progress Notes (Signed)
MRN: [id number] Name: Beverly Black  Sex: female Age: 82 y.o. DOB: 08/07/1928  PSC #:  Facility/Room: [facility] / 411 P Level Of Care: SNF Provider: Randon GoldsmithAnne D. Lyn HollingsheadAlexander, MD Emergency Contacts: Extended Emergency Contact Information Primary Emergency Contact: [name],[name] Address: [address]          [place] [id number] [place] Home Phone: [phone] Relation: Daughter Secondary Emergency Contact: [name],[name]  [place] Home Phone: [phone] Relation: Daughter  Code Status: DNR  Allergies: Sulfa antibiotics  Chief Complaint  Patient presents with  . Acute Visit    Acute    HPI: Patient is 82 y.o. female who nursing asked me to see for continued redness to eyes after being on garamycin eye drops for conjunctivitis for 10 days. Pt has no allergy sx and her L eye is minimally involved. No hx of fever, colds cough.  Past Medical History:  Diagnosis Date  . Aneurysm (HCC)    of the eye  . Decubitus ulcer of sacral region, stage 4 (HCC)   . Hyperlipidemia   . Hypertension   . Osteoporosis   . Stroke [facility](HCC) 10/2008    Past Surgical History:  Procedure Laterality Date  . EYE SURGERY    . PARTIAL HYSTERECTOMY  1968      Medication List       Accurate as of 01/26/16  2:05 PM. Always use your most recent med list.          amLODipine 10 MG tablet Commonly known as:  NORVASC TAKE 1 TABLET AT BEDTIME   aspirin 81 MG chewable tablet Chew 81 mg by mouth daily.   atenolol 50 MG tablet Commonly known as:  TENORMIN TAKE 1 TABLET TWICE A DAY   atorvastatin 40 MG tablet Commonly known as:  LIPITOR TAKE 1 TABLET DAILY   budesonide 0.25 MG/2ML nebulizer solution Commonly known as:  PULMICORT Take 2 mLs (0.25 mg total) by nebulization 2 (two) times daily.   cholecalciferol 1000 units tablet Commonly known as:  VITAMIN D Take 1,000 Units by mouth daily.   docusate sodium 100 MG capsule Commonly known as:   COLACE Take 100 mg by mouth 2 (two) times daily.   escitalopram 10 MG tablet Commonly known as:  LEXAPRO Take 10 mg by mouth daily.   feeding supplement (PRO-STAT SUGAR FREE 64) Liqd Take 30 mLs by mouth 2 (two) times daily.   ferrous sulfate 325 (65 FE) MG tablet Take 325 mg by mouth daily with breakfast.   fexofenadine 180 MG tablet Commonly known as:  ALLEGRA Take 180 mg by mouth daily.   LORazepam 0.5 MG tablet Commonly known as:  ATIVAN Take 1 tablet (0.5 mg total) by mouth every 8 (eight) hours.   mirtazapine 15 MG tablet Commonly known as:  REMERON Take 15 mg by mouth at bedtime.   MULTIVITAMIN ADULT PO Take 1 tablet by mouth daily.   NAMENDA XR 28 MG Cp24 24 hr capsule Generic drug:  memantine Take 28 mg by mouth daily.   NUTRITIONAL SUPPLEMENT PO Give Medpass 4 oz by mouth twice daily   oxyCODONE 5 MG immediate release tablet Commonly known as:  Oxy IR/ROXICODONE Take 1 tablet (5 mg total) by mouth every 6 (six) hours as needed for severe pain.   pantoprazole 40 MG tablet Commonly known as:  PROTONIX Take 1 tablet (40 mg total) by mouth 2 (two) times daily.   polyethylene glycol packet Commonly known as:  MIRALAX / GLYCOLAX  Take 17 g by mouth daily. To be hold for diarrhea   Potassium Chloride ER 20 MEQ Tbcr Take 1 tablet by mouth daily.   sennosides-docusate sodium 8.6-50 MG tablet Commonly known as:  SENOKOT-S Take 1 tablet by mouth 2 (two) times daily.   traMADol 50 MG tablet Commonly known as:  ULTRAM Give 1 tablet by mouth every 8 hours scheduled for pain   vitamin C 500 MG tablet Commonly known as:  ASCORBIC ACID Take 500 mg by mouth 2 (two) times daily.   ZETIA 10 MG tablet Generic drug:  ezetimibe TAKE 1 TABLET DAILY       No orders of the defined types were placed in this encounter.   Immunization History  Administered Date(s) Administered  . Influenza,inj,Quad PF,36+ Mos 04/16/2013, 05/06/2014  . Influenza-Unspecified  02/03/2015  . PPD Test 10/14/2014    Social History  Substance Use Topics  . Smoking status: Former Smoker    Packs/day: 0.50    Years: 50.00  . Smokeless tobacco: Never Used  . Alcohol use No    Review of Systems  DATA OBTAINED: from nurse GENERAL:  no fevers, fatigue, appetite changes SKIN: No itching, rash HEENT: red eyes RESPIRATORY: No cough, wheezing, SOB CARDIAC: No chest pain, palpitations, lower extremity edema  GI: No abdominal pain, No N/V/D or constipation, No heartburn or reflux  GU: No dysuria, frequency or urgency, or incontinence  MUSCULOSKELETAL: No unrelieved bone/joint pain NEUROLOGIC: No headache, dizziness  PSYCHIATRIC: No overt anxiety or sadness  Vitals:   01/25/16 1403  BP: 131/71  Pulse: 65  Resp: 18  Temp: 97.5 F (36.4 C)    Physical Exam  GENERAL APPEARANCE: Alert, conversant, No acute distress  SKIN: No diaphoresis rash HEENT: L eye with minimal injection, R eye with conjunctival injection and redness; neither eye has lid swelling RESPIRATORY: Breathing is even, unlabored. Lung sounds are clear   CARDIOVASCULAR: Heart RRR no murmurs, rubs or gallops. No peripheral edema  GASTROINTESTINAL: Abdomen is soft, non-tender, not distended w/ normal bowel sounds.  GENITOURINARY: Bladder non tender, not distended  MUSCULOSKELETAL: No abnormal joints or musculature NEUROLOGIC: Cranial nerves 2-12 grossly intact. Moves all extremities PSYCHIATRIC: Mood and affect appropriate to situation with dmentia, no behavioral issues  Patient Active Problem List   Diagnosis Date Noted  . Depression 12/23/2015  . Chronic diastolic heart failure (HCC) 09/05/2015  . Right leg pain 08/05/2015  . COPD (chronic obstructive pulmonary disease) (HCC) 06/25/2015  . Loss of weight 01/21/2015  . Anemia, iron deficiency 01/05/2015  . Acute upper respiratory infection 01/05/2015  . Dementia with behavioral disturbance 12/31/2014  . Iron deficiency anemia 12/10/2014   . Stomach discomfort 11/30/2014  . Subacute confusional state 11/30/2014  . Acute respiratory failure with hypoxia (HCC) 11/11/2014  . AKI (acute kidney injury) (HCC) 11/11/2014  . Hypernatremia 11/11/2014  . Hypokalemia 11/09/2014  . COPD exacerbation (HCC)   . Acute on chronic diastolic heart failure (HCC)   . Hypoxia   . SOB (shortness of breath)   . Pressure ulcer stage II 11/02/2014  . Palliative care encounter 11/02/2014  . HCAP (healthcare-associated pneumonia) 11/01/2014  . Leukocytosis 10/15/2014  . Closed fracture of part of upper end of humerus 10/14/2014  . Poor balance 11/25/2013  . History of stroke 11/25/2013  . 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 Myalgia 09/05/2012  . Disequilibrium 09/05/2012  . Hypertension, essential 09/05/2012  . 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 Mobility impaired 07/15/2012  . Physical exam, routine 12/07/2011  .  Cerumen impaction 04/04/2011  . Trapezius muscle spasm 04/04/2011  . Hypertensive heart disease with CHF (congestive heart failure) (HCC) 11/30/2010  . Hyperlipidemia 11/30/2010  . CVA (cerebral infarction) 11/30/2010  . Postmenopausal HRT (hormone replacement therapy) 11/30/2010  . Anxiety 11/30/2010  . Ankle weakness 11/30/2010    CBC    Component Value Date/Time   WBC 10.3 10/13/2015 1505   RBC 4.25 10/13/2015 1505   HGB 12.6 10/13/2015 1505   HCT 37.5 10/13/2015 1505   PLT 183 10/13/2015 1505   MCV 88.2 10/13/2015 1505   LYMPHSABS 1.1 11/01/2014 1050   MONOABS 1.1 (H) 11/01/2014 1050   EOSABS 0.0 11/01/2014 1050   BASOSABS 0.0 11/01/2014 1050    CMP     Component Value Date/Time   NA 138 10/13/2015 1505   NA 139 10/06/2015   K 4.3 10/13/2015 1505   CL 102 10/13/2015 1505   CO2 22 10/13/2015 1505   GLUCOSE 118 (H) 10/13/2015 1505   BUN 29 (H) 10/13/2015 1505   BUN 25 (A) 10/06/2015   CREATININE 0.79 10/13/2015 1505   CALCIUM 9.1 10/13/2015 1505   PROT 6.3 10/13/2015 1505   ALBUMIN 3.8  10/13/2015 1505   ALBUMIN 3.8 10/13/2015 1505   AST 17 10/13/2015 1505   ALT 16 10/13/2015 1505   ALKPHOS 81 10/13/2015 1505   BILITOT 0.6 10/13/2015 1505   GFRNONAA >60 11/05/2014 0225   GFRAA >60 11/05/2014 0225    Assessment and Plan  Refractory conjunctivitis - with one eye involved mot=re than the other and ;lack of other allergy sx I don't think this is allergic; will put pt on Cipro eye drops for 7 days and monitor result    Thurston Hole D. [name], MD

## 2016-02-11 ENCOUNTER — Non-Acute Institutional Stay (SKILLED_NURSING_FACILITY): Payer: Medicare Other | Admitting: Internal Medicine

## 2016-02-11 ENCOUNTER — Encounter: Payer: Self-pay | Admitting: Internal Medicine

## 2016-02-11 DIAGNOSIS — F0280 Dementia in other diseases classified elsewhere without behavioral disturbance: Secondary | ICD-10-CM | POA: Diagnosis not present

## 2016-02-11 DIAGNOSIS — J42 Unspecified chronic bronchitis: Secondary | ICD-10-CM

## 2016-02-11 DIAGNOSIS — G20 Parkinson's disease: Secondary | ICD-10-CM | POA: Diagnosis not present

## 2016-02-11 DIAGNOSIS — I5032 Chronic diastolic (congestive) heart failure: Secondary | ICD-10-CM | POA: Diagnosis not present

## 2016-02-11 NOTE — Progress Notes (Signed)
Location:  [facility] Room Number: 411 P Place of Service:  SNF ([id number])  [name]. [name], MD  Patient Care Team: Sheliah Hatch, MD as PCP - General [facility] Medicine)  Extended Emergency Contact Information Primary Emergency Contact: [name],[name] Address: [phone]          [name] [address] [name] Home Phone: [phone] Relation: Daughter Secondary Emergency Contact: [name] Home Phone: [phone] Relation: Daughter    Allergies: Sulfa antibiotics  Chief Complaint  Patient presents with  . Medical Management of Chronic Issues    Routine Visit    HPI: Patient is 82 y.o. female who is being seen for routine issues of CHF, COPD,and dementia assoc with parkinsons.  Past Medical History:  Diagnosis Date  . Aneurysm (HCC)    of the eye  . Decubitus ulcer of sacral region, stage 4 (HCC)   . Hyperlipidemia   . Hypertension   . Osteoporosis   . Stroke [facility]) 10/2008    Past Surgical History:  Procedure Laterality Date  . EYE SURGERY    . PARTIAL HYSTERECTOMY  1968      Medication List       Accurate as of 02/11/16 11:59 PM. Always use your most recent med list.          amLODipine 10 MG tablet Commonly known as:  NORVASC TAKE 1 TABLET AT BEDTIME   aspirin 81 MG chewable tablet Chew 81 mg by mouth daily.   atenolol 50 MG tablet Commonly known as:  TENORMIN TAKE 1 TABLET TWICE A DAY   atorvastatin 40 MG tablet Commonly known as:  LIPITOR TAKE 1 TABLET DAILY   cholecalciferol 1000 units tablet Commonly known as:  VITAMIN D Take 1,000 Units by mouth daily.   docusate sodium 100 MG capsule Commonly known as:  COLACE Take 100 mg by mouth 2 (two) times daily.   escitalopram 10 MG tablet Commonly known as:  LEXAPRO Take 10 mg by mouth daily.   ferrous sulfate 325 (65 FE) MG tablet Take 325 mg by mouth daily with breakfast.   fexofenadine 180 MG  tablet Commonly known as:  ALLEGRA Take 180 mg by mouth daily.   LORazepam 0.5 MG tablet Commonly known as:  ATIVAN Take 1 tablet (0.5 mg total) by mouth every 8 (eight) hours.   mirtazapine 15 MG tablet Commonly known as:  REMERON Take 15 mg by mouth at bedtime.   MULTIVITAMIN ADULT PO Take 1 tablet by mouth daily.   NAMENDA XR 28 MG Cp24 24 hr capsule Generic drug:  memantine Take 28 mg by mouth daily.   NUTRITIONAL SUPPLEMENT PO Give Medpass 4 oz by mouth twice daily   oxyCODONE 5 MG immediate release tablet Commonly known as:  Oxy IR/ROXICODONE Take 1 tablet (5 mg total) by mouth every 6 (six) hours as needed for severe pain.   pantoprazole 40 MG tablet Commonly known as:  PROTONIX Take 1 tablet (40 mg total) by mouth 2 (two) times daily.   polyethylene glycol packet Commonly known as:  MIRALAX / GLYCOLAX Take 17 g by mouth daily. To be hold for diarrhea   Potassium Chloride ER 20 MEQ Tbcr Take 1 tablet by mouth daily.   PULMICORT 0.25 MG/2ML nebulizer solution Generic drug:  budesonide Take 0.25 mg by nebulization every 4 (four) hours as needed.   sennosides-docusate sodium 8.6-50 MG tablet Commonly known as:  SENOKOT-S Take 1 tablet by  mouth 2 (two) times daily.   traMADol 50 MG tablet Commonly known as:  ULTRAM Give 1 tablet by mouth every 8 hours scheduled for pain   vitamin C 500 MG tablet Commonly known as:  ASCORBIC ACID Take 500 mg by mouth 2 (two) times daily.   ZETIA 10 MG tablet Generic drug:  ezetimibe TAKE 1 TABLET DAILY       Meds ordered this encounter  Medications  . budesonide (PULMICORT) 0.25 MG/2ML nebulizer solution    Sig: Take 0.25 mg by nebulization every 4 (four) hours as needed.    Immunization History  Administered Date(s) Administered  . Influenza,inj,Quad PF,36+ Mos 04/16/2013, 05/06/2014  . Influenza-Unspecified 02/03/2015, 02/08/2016  . PPD Test 10/14/2014    Social History  Substance Use Topics  . Smoking  status: Former Smoker    Packs/day: 0.50    Years: 50.00  . Smokeless tobacco: Never Used  . Alcohol use No    Review of Systems  DATA OBTAINED: from nurse GENERAL:  no fevers, fatigue, appetite changes SKIN: No itching, rash HEENT: No complaint RESPIRATORY: No cough, wheezing, SOB CARDIAC: No chest pain, palpitations, lower extremity edema  GI: No abdominal pain, No N/V/D or constipation, No heartburn or reflux  GU: No dysuria, frequency or urgency, or incontinence  MUSCULOSKELETAL: No unrelieved bone/joint pain NEUROLOGIC: No headache, dizziness  PSYCHIATRIC: No overt anxiety or sadness  Vitals:   02/11/16 0901  BP: (!) 141/70  Pulse: 70  Resp: 18  Temp: 97.4 F (36.3 C)   Body mass index is 21.97 kg/m. Physical Exam  GENERAL APPEARANCE: Alert, conversant, No acute distress  SKIN: No diaphoresis rash HEENT: Unremarkable RESPIRATORY: Breathing is even, unlabored. Lung sounds are clear   CARDIOVASCULAR: Heart RRR no murmurs, rubs or gallops. No peripheral edema  GASTROINTESTINAL: Abdomen is soft, non-tender, not distended w/ normal bowel sounds.  GENITOURINARY: Bladder non tender, not distended  MUSCULOSKELETAL: No abnormal joints or musculature NEUROLOGIC: Cranial nerves 2-12 grossly intact. Moves all extremities PSYCHIATRIC: Mood and affect appropriate to situation with dementia, no behavioral issues  Patient Active Problem List   Diagnosis Date Noted  . Depression 12/23/2015  . Chronic diastolic heart failure (HCC) 09/05/2015  . Right leg pain 08/05/2015  . COPD (chronic obstructive pulmonary disease) (HCC) 06/25/2015  . Loss of weight 01/21/2015  . Anemia, iron deficiency 01/05/2015  . Acute upper respiratory infection 01/05/2015  . 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 Iron deficiency anemia 12/10/2014  . Stomach discomfort 11/30/2014  . Subacute confusional state 11/30/2014  . Acute respiratory failure  with hypoxia (HCC) 11/11/2014  . AKI (acute kidney injury) (HCC) 11/11/2014  . Hypernatremia 11/11/2014  . Hypokalemia 11/09/2014  . COPD exacerbation (HCC)   . Acute on chronic diastolic heart failure (HCC)   . Hypoxia   . SOB (shortness of breath)   . Pressure ulcer stage II 11/02/2014  . Palliative care encounter 11/02/2014  . HCAP (healthcare-associated pneumonia) 11/01/2014  . Leukocytosis 10/15/2014  . Closed fracture of part of upper end of humerus 10/14/2014  . Poor balance 11/25/2013  . History of stroke 11/25/2013  . 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 Myalgia 09/05/2012  . Disequilibrium 09/05/2012  . Hypertension, essential 09/05/2012  . 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 Mobility impaired 07/15/2012  . Physical exam, routine 12/07/2011  . Cerumen impaction 04/04/2011  . Trapezius muscle spasm 04/04/2011  . Hypertensive heart disease with CHF (congestive heart failure) (HCC) 11/30/2010  . Hyperlipidemia 11/30/2010  .  CVA (cerebral infarction) 11/30/2010  . Postmenopausal HRT (hormone replacement therapy) 11/30/2010  . Anxiety 11/30/2010  . Ankle weakness 11/30/2010    CMP     Component Value Date/Time   NA 138 10/13/2015 1505   NA 139 10/06/2015   K 4.3 10/13/2015 1505   CL 102 10/13/2015 1505   CO2 22 10/13/2015 1505   GLUCOSE 118 (H) 10/13/2015 1505   BUN 29 (H) 10/13/2015 1505   BUN 25 (A) 10/06/2015   CREATININE 0.79 10/13/2015 1505   CALCIUM 9.1 10/13/2015 1505   PROT 6.3 10/13/2015 1505   ALBUMIN 3.8 10/13/2015 1505   ALBUMIN 3.8 10/13/2015 1505   AST 17 10/13/2015 1505   ALT 16 10/13/2015 1505   ALKPHOS 81 10/13/2015 1505   BILITOT 0.6 10/13/2015 1505   GFRNONAA >60 11/05/2014 0225   GFRAA >60 11/05/2014 0225    Recent Labs  09/03/15 10/06/15 10/13/15 1505  NA 137 139 138  K 4.1 4.6 4.3  CL  --   --  102  CO2  --   --  22  GLUCOSE  --   --  118*  BUN 34* 25* 29*  CREATININE 0.7 0.5 0.79  CALCIUM  --   --  9.1     Recent Labs  04/16/15 09/03/15 10/13/15 1505  AST 24 15 17   ALT 19 14 16   ALKPHOS 67 73 81  BILITOT  --   --  0.6  PROT  --   --  6.3  ALBUMIN  --   --  3.8  3.8    Recent Labs  04/16/15 09/01/15 10/13/15 1505  WBC 5.5 7.0 10.3  HGB 10.3* 12.0 12.6  HCT 32* 37 37.5  MCV  --   --  88.2  PLT 174 185 183    Recent Labs  03/08/15 09/03/15  CHOL 125 150  LDLCALC 76 77  TRIG 89 145   No results found for: MICROALBUR Lab Results  Component Value Date   TSH 0.79 10/16/2013   Lab Results  Component Value Date   HGBA1C 6.1 08/11/2015   Lab Results  Component Value Date   CHOL 150 09/03/2015   HDL 44 09/03/2015   LDLCALC 77 09/03/2015   LDLDIRECT 190.1 05/06/2014   TRIG 145 09/03/2015   CHOLHDL 7 05/06/2014    Significant Diagnostic Results in last 30 days:  No results found.  Assessment and Plan  Chronic diastolic heart failure (HCC) No known exacerbations; stable without lasix, cont tenormin 50 mg BID  COPD (chronic obstructive pulmonary disease) (HCC) No exacerbation in greater than a year;plan cont with allegra daily and pulmicort prn   Dementia due to Parkinson's disease without behavioral disturbance (HCC) Chronic, no great declines; cont namenda XR 28 mg daily     Leani Myron D. [name], MD

## 2016-02-26 ENCOUNTER — Encounter: Payer: Self-pay | Admitting: Internal Medicine

## 2016-02-26 NOTE — Assessment & Plan Note (Signed)
No known exacerbations; stable without lasix, cont tenormin 50 mg BID

## 2016-02-26 NOTE — Assessment & Plan Note (Signed)
No exacerbation in greater than a year;plan cont with allegra daily and pulmicort prn

## 2016-02-26 NOTE — Assessment & Plan Note (Signed)
Chronic, no great declines; cont namenda XR 28 mg daily

## 2016-02-29 LAB — TSH: TSH: 2.31 u[IU]/mL (ref 0.41–5.90)

## 2016-02-29 LAB — CBC AND DIFFERENTIAL
HCT: 39 % (ref 36–46)
Hemoglobin: 13 g/dL (ref 12.0–16.0)
Platelets: 182 10*3/uL (ref 150–399)
WBC: 7.6 10^3/mL

## 2016-02-29 LAB — HEMOGLOBIN A1C: Hemoglobin A1C: 5.4

## 2016-03-01 LAB — BASIC METABOLIC PANEL
BUN: 28 mg/dL — AB (ref 4–21)
CREATININE: 0.6 mg/dL (ref 0.5–1.1)
Glucose: 118 mg/dL
Potassium: 4.3 mmol/L (ref 3.4–5.3)
Sodium: 138 mmol/L (ref 137–147)

## 2016-03-01 LAB — LIPID PANEL
CHOLESTEROL: 160 mg/dL (ref 0–200)
HDL: 38 mg/dL (ref 35–70)
LDL Cholesterol: 58 mg/dL
TRIGLYCERIDES: 318 mg/dL — AB (ref 40–160)

## 2016-03-01 LAB — HEPATIC FUNCTION PANEL
ALK PHOS: 88 U/L (ref 25–125)
Bilirubin, Total: 0.3 mg/dL

## 2016-03-14 ENCOUNTER — Non-Acute Institutional Stay (SKILLED_NURSING_FACILITY): Payer: Medicare Other | Admitting: Internal Medicine

## 2016-03-14 ENCOUNTER — Encounter: Payer: Self-pay | Admitting: Internal Medicine

## 2016-03-14 DIAGNOSIS — D508 Other iron deficiency anemias: Secondary | ICD-10-CM

## 2016-03-14 DIAGNOSIS — M818 Other osteoporosis without current pathological fracture: Secondary | ICD-10-CM | POA: Diagnosis not present

## 2016-03-14 DIAGNOSIS — E782 Mixed hyperlipidemia: Secondary | ICD-10-CM | POA: Diagnosis not present

## 2016-03-14 NOTE — Progress Notes (Signed)
[facility] Room Number: 411P Place of Service:  SNF ([phone])  Randon Goldsmithnne D. Lyn HollingsheadAlexander, MD  Patient Care Team: Sheliah HatchKatherine E Tabori, MD as PCP - General [facility](Family Medicine)  Extended Emergency Contact Information Primary Emergency Contact: [name],[name] Address: [phone]          [place] [id number] [place] Home Phone: [phone] Relation: Daughter Secondary Emergency Contact: [name],[name]  [place] Home Phone: [phone] Relation: Daughter    Allergies: Sulfa antibiotics  Chief Complaint  Patient presents with  . Medical Management of Chronic Issues    Routine Visit    HPI: Patient is 82 y.o. female who is being seen for routine issues of ostroporosis, HLD and iron def anemia.  Past Medical History:  Diagnosis Date  . Aneurysm (HCC)    of the eye  . Decubitus ulcer of sacral region, stage 4 (HCC)   . Hyperlipidemia   . Hypertension   . Osteoporosis   . Stroke [facility](HCC) 10/2008    Past Surgical History:  Procedure Laterality Date  . EYE SURGERY    . PARTIAL HYSTERECTOMY  1968      Medication List       Accurate as of 03/14/16 11:59 PM. Always use your most recent med list.          amLODipine 10 MG tablet Commonly known as:  NORVASC TAKE 1 TABLET AT BEDTIME   aspirin 81 MG chewable tablet Chew 81 mg by mouth daily.   atenolol 50 MG tablet Commonly known as:  TENORMIN TAKE 1 TABLET TWICE A DAY   atorvastatin 40 MG tablet Commonly known as:  LIPITOR TAKE 1 TABLET DAILY   cholecalciferol 1000 units tablet Commonly known as:  VITAMIN D Take 1,000 Units by mouth daily.   docusate sodium 100 MG capsule Commonly known as:  COLACE Take 100 mg by mouth 2 (two) times daily.   escitalopram 10 MG tablet Commonly known as:  LEXAPRO Take 10 mg by mouth daily.   ferrous sulfate 325 (65 FE) MG tablet Take 325 mg by mouth daily with breakfast.   fexofenadine 180 MG  tablet Commonly known as:  ALLEGRA Take 180 mg by mouth daily.   LORazepam 0.5 MG tablet Commonly known as:  ATIVAN Take 1 tablet (0.5 mg total) by mouth every 8 (eight) hours.   mirtazapine 15 MG tablet Commonly known as:  REMERON Take 15 mg by mouth at bedtime.   MULTIVITAMIN ADULT PO Take 1 tablet by mouth daily.   NAMENDA XR 28 MG Cp24 24 hr capsule Generic drug:  memantine Take 28 mg by mouth daily.   NUTRITIONAL SUPPLEMENT PO Give Medpass 4 oz by mouth twice daily   oxyCODONE 5 MG immediate release tablet Commonly known as:  Oxy IR/ROXICODONE Take 1 tablet (5 mg total) by mouth every 6 (six) hours as needed for severe pain.   pantoprazole 40 MG tablet Commonly known as:  PROTONIX Take 1 tablet (40 mg total) by mouth 2 (two) times daily.   polyethylene glycol packet Commonly known as:  MIRALAX / GLYCOLAX Take 17 g by mouth daily. To be hold for diarrhea   Potassium Chloride ER 20 MEQ Tbcr Take 1 tablet by mouth daily.   PULMICORT 0.25 MG/2ML nebulizer solution Generic drug:  budesonide Take 0.25 mg by nebulization every 4 (four) hours as needed.   sennosides-docusate sodium 8.6-50 MG tablet Commonly known as:  SENOKOT-S Take 1 tablet by mouth  2 (two) times daily.   traMADol 50 MG tablet Commonly known as:  ULTRAM Give 1 tablet by mouth every 8 hours scheduled for pain   vitamin C 500 MG tablet Commonly known as:  ASCORBIC ACID Take 500 mg by mouth 2 (two) times daily.   ZETIA 10 MG tablet Generic drug:  ezetimibe TAKE 1 TABLET DAILY       No orders of the defined types were placed in this encounter.   Immunization History  Administered Date(s) Administered  . Influenza,inj,Quad PF,36+ Mos 04/16/2013, 05/06/2014  . Influenza-Unspecified 02/03/2015, 02/08/2016  . PPD Test 10/14/2014    Social History  Substance Use Topics  . Smoking status: Former Smoker    Packs/day: 0.50    Years: 50.00  . Smokeless tobacco: Never Used  . Alcohol use No     Review of Systems  DATA OBTAINED: from nurse; pt can not fully participate GENERAL:  no fevers, fatigue, appetite changes SKIN: No itching, rash HEENT: No complaint RESPIRATORY: No cough, wheezing, SOB CARDIAC: No chest pain, palpitations, lower extremity edema  GI: No abdominal pain, No N/V/D or constipation, No heartburn or reflux  GU: No dysuria, frequency or urgency, or incontinence  MUSCULOSKELETAL: No unrelieved bone/joint pain NEUROLOGIC: No headache, dizziness  PSYCHIATRIC: No overt anxiety or sadness  Vitals:   03/14/16 0917  BP: (!) 160/65  Pulse: 68  Resp: 18  Temp: 97.4 F (36.3 C)   Body mass index is 22.55 kg/m. Physical Exam  GENERAL APPEARANCE: Alert, conversant, No acute distress  SKIN: No diaphoresis rash HEENT: Unremarkable RESPIRATORY: Breathing is even, unlabored. Lung sounds are clear   CARDIOVASCULAR: Heart RRR no murmurs, rubs or gallops. No peripheral edema  GASTROINTESTINAL: Abdomen is soft, non-tender, not distended w/ normal bowel sounds.  GENITOURINARY: Bladder non tender, not distended  MUSCULOSKELETAL: No abnormal joints or musculature NEUROLOGIC: Cranial nerves 2-12 grossly intact. Moves all extremities PSYCHIATRIC: Mood and affect appropriate to situation with dementia, no behavioral issues  Patient Active Problem List   Diagnosis Date Noted  . Osteoporosis 03/18/2016  . Depression 12/23/2015  . Chronic diastolic heart failure (HCC) 09/05/2015  . Right leg pain 08/05/2015  . COPD (chronic obstructive pulmonary disease) (HCC) 06/25/2015  . Loss of weight 01/21/2015  . Anemia, iron deficiency 01/05/2015  . Acute upper respiratory infection 01/05/2015  . 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 Iron deficiency anemia 12/10/2014  . Stomach discomfort 11/30/2014  . Subacute confusional state 11/30/2014  . Acute respiratory failure with hypoxia (HCC) 11/11/2014  . AKI (acute kidney  injury) (HCC) 11/11/2014  . Hypernatremia 11/11/2014  . Hypokalemia 11/09/2014  . COPD exacerbation (HCC)   . Acute on chronic diastolic heart failure (HCC)   . Hypoxia   . SOB (shortness of breath)   . Pressure ulcer stage II 11/02/2014  . Palliative care encounter 11/02/2014  . HCAP (healthcare-associated pneumonia) 11/01/2014  . Leukocytosis 10/15/2014  . Closed fracture of part of upper end of humerus 10/14/2014  . Poor balance 11/25/2013  . History of stroke 11/25/2013  . 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 Myalgia 09/05/2012  . Disequilibrium 09/05/2012  . Hypertension, essential 09/05/2012  . 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 Mobility impaired 07/15/2012  . Physical exam, routine 12/07/2011  . Cerumen impaction 04/04/2011  . Trapezius muscle spasm 04/04/2011  . Hypertensive heart disease with CHF (congestive heart failure) (HCC) 11/30/2010  . Hyperlipidemia 11/30/2010  . CVA (cerebral infarction) 11/30/2010  . Postmenopausal HRT (hormone  replacement therapy) 11/30/2010  . Anxiety 11/30/2010  . Ankle weakness 11/30/2010    CMP     Component Value Date/Time   NA 138 03/01/2016   K 4.3 03/01/2016   CL 102 10/13/2015 1505   CO2 22 10/13/2015 1505   GLUCOSE 118 (H) 10/13/2015 1505   BUN 28 (A) 03/01/2016   CREATININE 0.6 03/01/2016   CREATININE 0.79 10/13/2015 1505   CALCIUM 9.1 10/13/2015 1505   PROT 6.3 10/13/2015 1505   ALBUMIN 3.8 10/13/2015 1505   ALBUMIN 3.8 10/13/2015 1505   AST 17 10/13/2015 1505   ALT 16 10/13/2015 1505   ALKPHOS 88 03/01/2016   BILITOT 0.6 10/13/2015 1505   GFRNONAA >60 11/05/2014 0225   GFRAA >60 11/05/2014 0225   02/29/16 Vitamin D 32  Recent Labs  10/06/15 10/13/15 1505 03/01/16  NA 139 138 138  K 4.6 4.3 4.3  CL  --  102  --   CO2  --  22  --   GLUCOSE  --  118*  --   BUN 25* 29* 28*  CREATININE 0.5 0.79 0.6  CALCIUM  --  9.1  --     Recent Labs  04/16/15 09/03/15 10/13/15 1505 03/01/16  AST 24 15  17   --   ALT 19 14 16   --   ALKPHOS 67 73 81 88  BILITOT  --   --  0.6  --   PROT  --   --  6.3  --   ALBUMIN  --   --  3.8  3.8  --     Recent Labs  09/01/15 10/13/15 1505 02/29/16  WBC 7.0 10.3 7.6  HGB 12.0 12.6 13.0  HCT 37 37.5 39  MCV  --  88.2  --   PLT 185 183 182    Recent Labs  09/03/15 03/01/16  CHOL 150 160  LDLCALC 77 58  TRIG 145 318*   No results found for: MICROALBUR Lab Results  Component Value Date   TSH 2.31 02/29/2016   Lab Results  Component Value Date   HGBA1C 5.4 02/29/2016   Lab Results  Component Value Date   CHOL 160 03/01/2016   HDL 38 03/01/2016   LDLCALC 58 03/01/2016   LDLDIRECT 190.1 05/06/2014   TRIG 318 (A) 03/01/2016   CHOLHDL 7 05/06/2014    Significant Diagnostic Results in last 30 days:  No results found.  Assessment and Plan  Osteoporosis No falls or fractures; plan to cont Vitamin D 1000u daily  Hyperlipidemia Good control with LDL 58, HDL 38 with lipitor 40 mg daily; TG 318, on zetia 10 mg daily;plan to cont current meds  Anemia, iron deficiency Recent labs iron levels still low at 41; Hb 13 which is improved;plan to cont FeSO4 325 mg daily     Keefer Soulliere D. Lyn HollingsheadAlexander, MD

## 2016-03-18 DIAGNOSIS — M810 Age-related osteoporosis without current pathological fracture: Secondary | ICD-10-CM | POA: Insufficient documentation

## 2016-03-18 NOTE — Assessment & Plan Note (Signed)
Good control with LDL 58, HDL 38 with lipitor 40 mg daily; TG 318, on zetia 10 mg daily;plan to cont current meds

## 2016-03-18 NOTE — Assessment & Plan Note (Signed)
No falls or fractures; plan to cont Vitamin D 1000u daily

## 2016-03-18 NOTE — Assessment & Plan Note (Signed)
Recent labs iron levels still low at 41; Hb 13 which is improved;plan to cont FeSO4 325 mg daily

## 2016-03-21 ENCOUNTER — Non-Acute Institutional Stay (SKILLED_NURSING_FACILITY): Payer: Medicare Other | Admitting: Internal Medicine

## 2016-03-21 ENCOUNTER — Encounter: Payer: Self-pay | Admitting: Internal Medicine

## 2016-03-21 DIAGNOSIS — L02411 Cutaneous abscess of right axilla: Secondary | ICD-10-CM | POA: Diagnosis not present

## 2016-03-21 NOTE — Progress Notes (Signed)
[facility] Room Number: 411P Place of Service:  SNF ([phone])  Randon Goldsmithnne D. Lyn HollingsheadAlexander, MD  Patient Care Team: Sheliah HatchKatherine E Tabori, MD as PCP - General [facility](Family Medicine)  Extended Emergency Contact Information Primary Emergency Contact: [name],[name] Address: [phone]          [place] [phone] [place] Home Phone: [phone] Relation: Daughter Secondary Emergency Contact: [name],[name]  [place] Home Phone: [phone] Relation: Daughter    Allergies: Sulfa antibiotics  Chief Complaint  Patient presents with  . Acute Visit    Acute    HPI: Patient is 82 y.o. female who the wound nurse asked me to see for possible abscess under R arm. Pt admits she gets them from time to time.   Past Medical History:  Diagnosis Date  . Aneurysm (HCC)    of the eye  . Decubitus ulcer of sacral region, stage 4 (HCC)   . Hyperlipidemia   . Hypertension   . Osteoporosis   . Stroke [facility](HCC) 10/2008    Past Surgical History:  Procedure Laterality Date  . EYE SURGERY    . PARTIAL HYSTERECTOMY  1968      Medication List       Accurate as of 03/21/16 11:59 PM. Always use your most recent med list.          amLODipine 10 MG tablet Commonly known as:  NORVASC TAKE 1 TABLET AT BEDTIME   aspirin 81 MG chewable tablet Chew 81 mg by mouth daily.   atenolol 50 MG tablet Commonly known as:  TENORMIN TAKE 1 TABLET TWICE A DAY   atorvastatin 40 MG tablet Commonly known as:  LIPITOR TAKE 1 TABLET DAILY   cholecalciferol 1000 units tablet Commonly known as:  VITAMIN D Take 1,000 Units by mouth daily.   docusate sodium 100 MG capsule Commonly known as:  COLACE Take 100 mg by mouth 2 (two) times daily.   doxycycline 100 MG tablet Commonly known as:  VIBRA-TABS Take 100 mg by mouth 2 (two) times daily.   escitalopram 10 MG tablet Commonly known as:  LEXAPRO Take 10 mg by mouth daily.   ferrous  sulfate 325 (65 FE) MG tablet Take 325 mg by mouth daily with breakfast.   fexofenadine 180 MG tablet Commonly known as:  ALLEGRA Take 180 mg by mouth daily.   LORazepam 0.5 MG tablet Commonly known as:  ATIVAN Take 1 tablet (0.5 mg total) by mouth every 8 (eight) hours.   mirtazapine 15 MG tablet Commonly known as:  REMERON Take 15 mg by mouth at bedtime.   MULTIVITAMIN ADULT PO Take 1 tablet by mouth daily.   NAMENDA XR 28 MG Cp24 24 hr capsule Generic drug:  memantine Take 28 mg by mouth daily.   NUTRITIONAL SUPPLEMENT PO Give Medpass 4 oz by mouth twice daily   oxyCODONE 5 MG immediate release tablet Commonly known as:  Oxy IR/ROXICODONE Take 1 tablet (5 mg total) by mouth every 6 (six) hours as needed for severe pain.   pantoprazole 40 MG tablet Commonly known as:  PROTONIX Take 1 tablet (40 mg total) by mouth 2 (two) times daily.   polyethylene glycol packet Commonly known as:  MIRALAX / GLYCOLAX Take 17 g by mouth daily. To be hold for diarrhea   Potassium Chloride ER 20 MEQ Tbcr Take 1 tablet by mouth daily.   PULMICORT 0.25 MG/2ML nebulizer solution Generic drug:  budesonide Take 0.25  mg by nebulization every 4 (four) hours as needed.   sennosides-docusate sodium 8.6-50 MG tablet Commonly known as:  SENOKOT-S Take 1 tablet by mouth 2 (two) times daily.   traMADol 50 MG tablet Commonly known as:  ULTRAM Give 1 tablet by mouth every 8 hours scheduled for pain   vitamin C 500 MG tablet Commonly known as:  ASCORBIC ACID Take 500 mg by mouth 2 (two) times daily.   ZETIA 10 MG tablet Generic drug:  ezetimibe TAKE 1 TABLET DAILY       Meds ordered this encounter  Medications  . doxycycline (VIBRA-TABS) 100 MG tablet    Sig: Take 100 mg by mouth 2 (two) times daily.    Immunization History  Administered Date(s) Administered  . Influenza,inj,Quad PF,36+ Mos 04/16/2013, 05/06/2014  . Influenza-Unspecified 02/03/2015, 02/08/2016  . PPD Test  10/14/2014    Social History  Substance Use Topics  . Smoking status: Former Smoker    Packs/day: 0.50    Years: 50.00  . Smokeless tobacco: Never Used  . Alcohol use No    Review of Systems  DATA OBTAINED: from patient, nurse GENERAL:  no fevers, fatigue, appetite changes SKIN: No itching, rash HEENT: No complaint RESPIRATORY: No cough, wheezing, SOB CARDIAC: No chest pain, palpitations, lower extremity edema  GI: No abdominal pain, No N/V/D or constipation, No heartburn or reflux  GU: No dysuria, frequency or urgency, or incontinence  MUSCULOSKELETAL: No unrelieved bone/joint pain NEUROLOGIC: No headache, dizziness  PSYCHIATRIC: No overt anxiety or sadness  Vitals:   03/21/16 1506  BP: (!) 160/75  Pulse: 61  Resp: 20  Temp: 97.4 F (36.3 C)   Body mass index is 22.9 kg/m. Physical Exam  GENERAL APPEARANCE: Alert, conversant, No acute distress  SKIN: R axilla - resolving abscess, no swelling or tender; 3 new hard nodules that are moderately tender HEENT: Unremarkable RESPIRATORY: Breathing is even, unlabored. Lung sounds are clear   CARDIOVASCULAR: Heart RRR no murmurs, rubs or gallops. No peripheral edema  GASTROINTESTINAL: Abdomen is soft, non-tender, not distended w/ normal bowel sounds.  GENITOURINARY: Bladder non tender, not distended  MUSCULOSKELETAL: No abnormal joints or musculature NEUROLOGIC: Cranial nerves 2-12 grossly intact. Moves all extremities PSYCHIATRIC: Mood and affect appropriate to situation dementia, no behavioral issues  Patient Active Problem List   Diagnosis Date Noted  . Osteoporosis 03/18/2016  . Depression 12/23/2015  . Chronic diastolic heart failure (HCC) 09/05/2015  . Right leg pain 08/05/2015  . COPD (chronic obstructive pulmonary disease) (HCC) 06/25/2015  . Loss of weight 01/21/2015  . Anemia, iron deficiency 01/05/2015  . Acute upper respiratory infection 01/05/2015  . Dementia due to Parkinson's disease without behavioral  disturbance (HCC) 12/31/2014  . Iron deficiency anemia 12/10/2014  . Stomach discomfort 11/30/2014  . Subacute confusional state 11/30/2014  . Acute respiratory failure with hypoxia (HCC) 11/11/2014  . AKI (acute kidney injury) (HCC) 11/11/2014  . Hypernatremia 11/11/2014  . Hypokalemia 11/09/2014  . COPD exacerbation (HCC)   . Acute on chronic diastolic heart failure (HCC)   . Hypoxia   . SOB (shortness of breath)   . Pressure ulcer stage II 11/02/2014  . Palliative care encounter 11/02/2014  . HCAP (healthcare-associated pneumonia) 11/01/2014  . Leukocytosis 10/15/2014  . Closed fracture of part of upper end of humerus 10/14/2014  . Poor balance 11/25/2013  . History of stroke 11/25/2013  . 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 Myalgia 09/05/2012  . Disequilibrium 09/05/2012  . Hypertension, essential 09/05/2012  . Osteoporosis,  post-menopausal 07/16/2012  . Mobility impaired 07/15/2012  . Physical exam, routine 12/07/2011  . Cerumen impaction 04/04/2011  . Trapezius muscle spasm 04/04/2011  . Hypertensive heart disease with CHF (congestive heart failure) (HCC) 11/30/2010  . Hyperlipidemia 11/30/2010  . CVA (cerebral infarction) 11/30/2010  . Postmenopausal HRT (hormone replacement therapy) 11/30/2010  . Anxiety 11/30/2010  . Ankle weakness 11/30/2010    CMP     Component Value Date/Time   NA 138 03/01/2016   K 4.3 03/01/2016   CL 102 10/13/2015 1505   CO2 22 10/13/2015 1505   GLUCOSE 118 (H) 10/13/2015 1505   BUN 28 (A) 03/01/2016   CREATININE 0.6 03/01/2016   CREATININE 0.79 10/13/2015 1505   CALCIUM 9.1 10/13/2015 1505   PROT 6.3 10/13/2015 1505   ALBUMIN 3.8 10/13/2015 1505   ALBUMIN 3.8 10/13/2015 1505   AST 17 10/13/2015 1505   ALT 16 10/13/2015 1505   ALKPHOS 88 03/01/2016   BILITOT 0.6 10/13/2015 1505   GFRNONAA >60 11/05/2014 0225   GFRAA >60 11/05/2014 0225    Recent Labs  10/06/15 10/13/15 1505 03/01/16  NA 139 138 138  K 4.6 4.3 4.3    CL  --  102  --   CO2  --  22  --   GLUCOSE  --  118*  --   BUN 25* 29* 28*  CREATININE 0.5 0.79 0.6  CALCIUM  --  9.1  --     Recent Labs  04/16/15 09/03/15 10/13/15 1505 03/01/16  AST 24 15 17   --   ALT 19 14 16   --   ALKPHOS 67 73 81 88  BILITOT  --   --  0.6  --   PROT  --   --  6.3  --   ALBUMIN  --   --  3.8  3.8  --     Recent Labs  09/01/15 10/13/15 1505 02/29/16  WBC 7.0 10.3 7.6  HGB 12.0 12.6 13.0  HCT 37 37.5 39  MCV  --  88.2  --   PLT 185 183 182    Recent Labs  09/03/15 03/01/16  CHOL 150 160  LDLCALC 77 58  TRIG 145 318*   No results found for: MICROALBUR Lab Results  Component Value Date   TSH 2.31 02/29/2016   Lab Results  Component Value Date   HGBA1C 5.4 02/29/2016   Lab Results  Component Value Date   CHOL 160 03/01/2016   HDL 38 03/01/2016   LDLCALC 58 03/01/2016   LDLDIRECT 190.1 05/06/2014   TRIG 318 (A) 03/01/2016   CHOLHDL 7 05/06/2014    Significant Diagnostic Results in last 30 days:  No results found.  Assessment and Plan  R AXILLARY ABSCESS - new, not ripe;start doxycycline 100 mg BID for 7 days     Randon GoldsmithAnne D. Lyn HollingsheadAlexander, MD

## 2016-03-26 ENCOUNTER — Encounter: Payer: Self-pay | Admitting: Internal Medicine

## 2016-04-13 ENCOUNTER — Encounter: Payer: Self-pay | Admitting: Internal Medicine

## 2016-04-13 ENCOUNTER — Non-Acute Institutional Stay (SKILLED_NURSING_FACILITY): Payer: Medicare Other | Admitting: Internal Medicine

## 2016-04-13 DIAGNOSIS — F329 Major depressive disorder, single episode, unspecified: Secondary | ICD-10-CM

## 2016-04-13 DIAGNOSIS — F419 Anxiety disorder, unspecified: Secondary | ICD-10-CM | POA: Diagnosis not present

## 2016-04-13 DIAGNOSIS — I110 Hypertensive heart disease with heart failure: Secondary | ICD-10-CM | POA: Diagnosis not present

## 2016-04-13 DIAGNOSIS — F32A Depression, unspecified: Secondary | ICD-10-CM

## 2016-04-13 NOTE — Progress Notes (Signed)
Location:  [facility] Room Number: 411P Place of Service:  SNF [phone])  Beverly Black. Beverly Hollingshead, MD  Patient Care Team: Sheliah Hatch, MD as PCP - [facility])  Extended Emergency Contact Information Primary Emergency Contact: [name],[name] Address: [phone]          [name] [address] [name] Home Phone: [phone] Relation: Daughter Secondary Emergency Contact: [name] [place] Home Phone: [phone] Relation: Daughter    Allergies: Sulfa antibiotics  Chief Complaint  Patient presents with  . Medical Management of Chronic Issues    Routine Visit    HPI: Patient is 82 y.o. female who is being seen for routine issues of HTN, anxiety and depression.  Past Medical History:  Diagnosis Date  . Aneurysm (HCC)    of the eye  . Decubitus ulcer of sacral region, stage 4 (HCC)   . Hyperlipidemia   . Hypertension   . Osteoporosis   . Stroke [facility]) 10/2008    Past Surgical History:  Procedure Laterality Date  . EYE SURGERY    . PARTIAL HYSTERECTOMY  1968    Allergies as of 04/13/2016      Reactions   Sulfa Antibiotics    Cant remember reaction      Medication List       Accurate as of 04/13/16 11:59 PM. Always use your most recent med list.          amLODipine 10 MG tablet Commonly known as:  NORVASC TAKE 1 TABLET AT BEDTIME   aspirin 81 MG chewable tablet Chew 81 mg by mouth daily.   atenolol 50 MG tablet Commonly known as:  TENORMIN TAKE 1 TABLET TWICE A DAY   atorvastatin 40 MG tablet Commonly known as:  LIPITOR TAKE 1 TABLET DAILY   cholecalciferol 1000 units tablet Commonly known as:  VITAMIN D Take 1,000 Units by mouth daily.   docusate sodium 100 MG capsule Commonly known as:  COLACE Take 100 mg by mouth 2 (two) times daily.   escitalopram 10 MG tablet Commonly known as:  LEXAPRO Take 10 mg by mouth daily.   ferrous sulfate 325 (65 FE) MG  tablet Take 325 mg by mouth daily with breakfast.   fexofenadine 180 MG tablet Commonly known as:  ALLEGRA Take 180 mg by mouth daily.   LORazepam 0.5 MG tablet Commonly known as:  ATIVAN Take 1 tablet (0.5 mg total) by mouth every 8 (eight) hours.   mirtazapine 15 MG tablet Commonly known as:  REMERON Take 15 mg by mouth at bedtime.   MULTIVITAMIN ADULT PO Take 1 tablet by mouth daily.   NAMENDA XR 28 MG Cp24 24 hr capsule Generic drug:  memantine Take 28 mg by mouth daily.   NUTRITIONAL SUPPLEMENT PO Give Medpass 4 oz by mouth twice daily   oxyCODONE 5 MG immediate release tablet Commonly known as:  Oxy IR/ROXICODONE Take 1 tablet (5 mg total) by mouth every 6 (six) hours as needed for severe pain.   pantoprazole 40 MG tablet Commonly known as:  PROTONIX Take 1 tablet (40 mg total) by mouth 2 (two) times daily.   polyethylene glycol packet Commonly known as:  MIRALAX / GLYCOLAX Take 17 g by mouth daily. To be hold for diarrhea   Potassium Chloride ER 20 MEQ Tbcr Take 1 tablet by mouth daily.   PULMICORT 0.25 MG/2ML nebulizer solution Generic drug:  budesonide Take 0.25 mg by nebulization every 4 (  four) hours as needed.   sennosides-docusate sodium 8.6-50 MG tablet Commonly known as:  SENOKOT-S Take 1 tablet by mouth 2 (two) times daily.   traMADol 50 MG tablet Commonly known as:  ULTRAM Give 1 tablet by mouth every 8 hours scheduled for pain   vitamin C 500 MG tablet Commonly known as:  ASCORBIC ACID Take 500 mg by mouth 2 (two) times daily.   ZETIA 10 MG tablet Generic drug:  ezetimibe TAKE 1 TABLET DAILY       No orders of the defined types were placed in this encounter.   Immunization History  Administered Date(s) Administered  . Influenza,inj,Quad PF,36+ Mos 04/16/2013, 05/06/2014  . Influenza-Unspecified 02/03/2015, 02/08/2016  . PPD Test 10/14/2014    Social History  Substance Use Topics  . Smoking status: Former Smoker    Packs/day:  0.50    Years: 50.00  . Smokeless tobacco: Never Used  . Alcohol use No    Review of Systems  DATA OBTAINED: from  nurse GENERAL:  no fevers, fatigue, appetite changes SKIN: No itching, rash HEENT: No complaint RESPIRATORY: No cough, wheezing, SOB CARDIAC: No chest pain, palpitations, lower extremity edema  GI: No abdominal pain, No N/V/D or constipation, No heartburn or reflux  GU: No dysuria, frequency or urgency, or incontinence  MUSCULOSKELETAL: No unrelieved bone/joint pain NEUROLOGIC: No headache, dizziness  PSYCHIATRIC: No overt anxiety or sadness  Vitals:   04/13/16 1106  BP: (!) 141/70  Pulse: (!) 58  Resp: 18  Temp: 97 F (36.1 C)   Body mass index is 22.14 kg/m. Physical Exam  GENERAL APPEARANCE: Alert, conversant, No acute distress  SKIN: No diaphoresis rash HEENT: Unremarkable RESPIRATORY: Breathing is even, unlabored. Lung sounds are clear   CARDIOVASCULAR: Heart RRR no murmurs, rubs or gallops. No peripheral edema  GASTROINTESTINAL: Abdomen is soft, non-tender, not distended w/ normal bowel sounds.  GENITOURINARY: Bladder non tender, not distended  MUSCULOSKELETAL: No abnormal joints or musculature NEUROLOGIC: Cranial nerves 2-12 grossly intact. Moves all extremities PSYCHIATRIC: Mood and affect appropriate to situation, no behavioral issues  Patient Active Problem List   Diagnosis Date Noted  . Osteoporosis 03/18/2016  . Depression 12/23/2015  . Chronic diastolic heart failure (HCC) 09/05/2015  . Right leg pain 08/05/2015  . COPD (chronic obstructive pulmonary disease) (HCC) 06/25/2015  . Loss of weight 01/21/2015  . Anemia, iron deficiency 01/05/2015  . Acute upper respiratory infection 01/05/2015  . 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 Iron deficiency anemia 12/10/2014  . Stomach discomfort 11/30/2014  . Subacute confusional state 11/30/2014  . Acute respiratory failure with hypoxia (HCC)  11/11/2014  . AKI (acute kidney injury) (HCC) 11/11/2014  . Hypernatremia 11/11/2014  . Hypokalemia 11/09/2014  . COPD exacerbation (HCC)   . Acute on chronic diastolic heart failure (HCC)   . Hypoxia   . SOB (shortness of breath)   . Pressure ulcer stage II 11/02/2014  . Palliative care encounter 11/02/2014  . HCAP (healthcare-associated pneumonia) 11/01/2014  . Leukocytosis 10/15/2014  . Closed fracture of part of upper end of humerus 10/14/2014  . Poor balance 11/25/2013  . History of stroke 11/25/2013  . 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 Myalgia 09/05/2012  . Disequilibrium 09/05/2012  . Hypertension, essential 09/05/2012  . 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 Mobility impaired 07/15/2012  . Physical exam, routine 12/07/2011  . Cerumen impaction 04/04/2011  . Trapezius muscle spasm 04/04/2011  . Hypertensive heart disease with CHF (congestive heart failure) (HCC)  11/30/2010  . Hyperlipidemia 11/30/2010  . CVA (cerebral infarction) 11/30/2010  . Postmenopausal HRT (hormone replacement therapy) 11/30/2010  . Anxiety 11/30/2010  . Ankle weakness 11/30/2010    CMP     Component Value Date/Time   NA 138 03/01/2016   K 4.3 03/01/2016   CL 102 10/13/2015 1505   CO2 22 10/13/2015 1505   GLUCOSE 118 (H) 10/13/2015 1505   BUN 28 (A) 03/01/2016   CREATININE 0.6 03/01/2016   CREATININE 0.79 10/13/2015 1505   CALCIUM 9.1 10/13/2015 1505   PROT 6.3 10/13/2015 1505   ALBUMIN 3.8 10/13/2015 1505   ALBUMIN 3.8 10/13/2015 1505   AST 17 10/13/2015 1505   ALT 16 10/13/2015 1505   ALKPHOS 88 03/01/2016   BILITOT 0.6 10/13/2015 1505   GFRNONAA >60 11/05/2014 0225   GFRAA >60 11/05/2014 0225    Recent Labs  10/06/15 10/13/15 1505 03/01/16  NA 139 138 138  K 4.6 4.3 4.3  CL  --  102  --   CO2  --  22  --   GLUCOSE  --  118*  --   BUN 25* 29* 28*  CREATININE 0.5 0.79 0.6  CALCIUM  --  9.1  --     Recent Labs  09/03/15 10/13/15 1505 03/01/16  AST 15 17   --   ALT 14 16  --   ALKPHOS 73 81 88  BILITOT  --  0.6  --   PROT  --  6.3  --   ALBUMIN  --  3.8  3.8  --     Recent Labs  09/01/15 10/13/15 1505 02/29/16  WBC 7.0 10.3 7.6  HGB 12.0 12.6 13.0  HCT 37 37.5 39  MCV  --  88.2  --   PLT 185 183 182    Recent Labs  09/03/15 03/01/16  CHOL 150 160  LDLCALC 77 58  TRIG 145 318*   No results found for: MICROALBUR Lab Results  Component Value Date   TSH 2.31 02/29/2016   Lab Results  Component Value Date   HGBA1C 5.4 02/29/2016   Lab Results  Component Value Date   CHOL 160 03/01/2016   HDL 38 03/01/2016   LDLCALC 58 03/01/2016   LDLDIRECT 190.1 05/06/2014   TRIG 318 (A) 03/01/2016   CHOLHDL 7 05/06/2014    Significant Diagnostic Results in last 30 days:  No results found.  Assessment and Plan  Hypertensive heart disease with CHF (congestive heart failure) (HCC) One point off meeting goal for any age;plan to cont atenolol 50 mg BID and norvasc 10 mg daily  Anxiety Pt appears calm and comfortable; plan to cont ativan 0.5 mg q8  Depression Stable; plan to cont remeron 15 mg qHS and lexapro 10 mg daily    Laporshia Hogen D. Beverly HollingsheadAlexander, MD

## 2016-04-18 ENCOUNTER — Non-Acute Institutional Stay (SKILLED_NURSING_FACILITY): Payer: Medicare Other | Admitting: Internal Medicine

## 2016-04-18 ENCOUNTER — Encounter: Payer: Self-pay | Admitting: Internal Medicine

## 2016-04-18 DIAGNOSIS — R635 Abnormal weight gain: Secondary | ICD-10-CM | POA: Diagnosis not present

## 2016-04-18 DIAGNOSIS — I5032 Chronic diastolic (congestive) heart failure: Secondary | ICD-10-CM

## 2016-04-18 NOTE — Progress Notes (Signed)
[facility] Room Number: 411P Place of Service:  SNF ([phone])  Randon Goldsmithnne D. Lyn HollingsheadAlexander, MD  Patient Care Team: Sheliah HatchKatherine E Tabori, MD as PCP - [facility])  Extended Emergency Contact Information Primary Emergency Contact: [name],[name] Address: [phone]          [place] [id number] [place] Home Phone: [phone] Relation: Daughter Secondary Emergency Contact: [name],[name]  [place] Home Phone: [phone] Relation: Daughter    Allergies: Sulfa antibiotics  Chief Complaint  Patient presents with  . Acute Visit    Acute    HPI: Patient is 82 y.o. female who is being seen acutely because dietary has made me aware of weight gain and pt has hx CHF. PT denies SOB, CP, fatigue.  Past Medical History:  Diagnosis Date  . Aneurysm (HCC)    of the eye  . Decubitus ulcer of sacral region, stage 4 (HCC)   . Hyperlipidemia   . Hypertension   . Osteoporosis   . Stroke [facility]) 10/2008    Past Surgical History:  Procedure Laterality Date  . EYE SURGERY    . PARTIAL HYSTERECTOMY  1968      Medication List       Accurate as of 04/18/16  2:46 PM. Always use your most recent med list.          amLODipine 10 MG tablet Commonly known as:  NORVASC TAKE 1 TABLET AT BEDTIME   aspirin 81 MG chewable tablet Chew 81 mg by mouth daily.   atenolol 50 MG tablet Commonly known as:  TENORMIN TAKE 1 TABLET TWICE A DAY   atorvastatin 40 MG tablet Commonly known as:  LIPITOR TAKE 1 TABLET DAILY   cholecalciferol 1000 units tablet Commonly known as:  VITAMIN D Take 1,000 Units by mouth daily.   docusate sodium 100 MG capsule Commonly known as:  COLACE Take 100 mg by mouth 2 (two) times daily.   escitalopram 10 MG tablet Commonly known as:  LEXAPRO Take 10 mg by mouth daily.   ferrous sulfate 325 (65 FE) MG tablet Take 325 mg by mouth daily with breakfast.   fexofenadine 180 MG  tablet Commonly known as:  ALLEGRA Take 180 mg by mouth daily.   LORazepam 0.5 MG tablet Commonly known as:  ATIVAN Take 1 tablet (0.5 mg total) by mouth every 8 (eight) hours.   mirtazapine 15 MG tablet Commonly known as:  REMERON Take 15 mg by mouth at bedtime.   MULTIVITAMIN ADULT PO Take 1 tablet by mouth daily.   NAMENDA XR 28 MG Cp24 24 hr capsule Generic drug:  memantine Take 28 mg by mouth daily.   NUTRITIONAL SUPPLEMENT PO Give Medpass 4 oz by mouth twice daily   oxyCODONE 5 MG immediate release tablet Commonly known as:  Oxy IR/ROXICODONE Take 1 tablet (5 mg total) by mouth every 6 (six) hours as needed for severe pain.   pantoprazole 40 MG tablet Commonly known as:  PROTONIX Take 1 tablet (40 mg total) by mouth 2 (two) times daily.   polyethylene glycol packet Commonly known as:  MIRALAX / GLYCOLAX Take 17 g by mouth daily. To be hold for diarrhea   Potassium Chloride ER 20 MEQ Tbcr Take 1 tablet by mouth daily.   PULMICORT 0.25 MG/2ML nebulizer solution Generic drug:  budesonide Take 0.25 mg by nebulization every 4 (four) hours as needed.   sennosides-docusate sodium 8.6-50 MG tablet Commonly known as:  SENOKOT-S Take 1 tablet by mouth 2 (two) times daily.   traMADol 50 MG tablet Commonly known as:  ULTRAM Give 1 tablet by mouth every 8 hours scheduled for pain   vitamin C 500 MG tablet Commonly known as:  ASCORBIC ACID Take 500 mg by mouth 2 (two) times daily.   ZETIA 10 MG tablet Generic drug:  ezetimibe TAKE 1 TABLET DAILY       No orders of the defined types were placed in this encounter.   Immunization History  Administered Date(s) Administered  . Influenza,inj,Quad PF,36+ Mos 04/16/2013, 05/06/2014  . Influenza-Unspecified 02/03/2015, 02/08/2016  . PPD Test 10/14/2014    Social History  Substance Use Topics  . Smoking status: Former Smoker    Packs/day: 0.50    Years: 50.00  . Smokeless tobacco: Never Used  . Alcohol use No     Review of Systems  DATA OBTAINED: from patient, nurse GENERAL:  no fevers, fatigue, appetite changes SKIN: No itching, rash HEENT: No complaint RESPIRATORY: No cough, wheezing, SOB CARDIAC: No chest pain, palpitations, lower extremity edema  GI: No abdominal pain, No N/V/D or constipation, No heartburn or reflux  GU: No dysuria, frequency or urgency, or incontinence  MUSCULOSKELETAL: No unrelieved bone/joint pain NEUROLOGIC: No headache, dizziness  PSYCHIATRIC: No overt anxiety or sadness  Vitals:   04/18/16 1443  BP: (!) 141/70  Pulse: 70  Resp: 18  Temp: 97 F (36.1 C)   Body mass index is 22.86 kg/m. Physical Exam  GENERAL APPEARANCE: Alert, conversant, No acute distress  SKIN: No diaphoresis rash HEENT: Unremarkable RESPIRATORY: Breathing is even, unlabored. Lung sounds are clear   CARDIOVASCULAR: Heart RRR no murmurs, rubs or gallops. No peripheral edema  GASTROINTESTINAL: Abdomen is soft, non-tender, not distended w/ normal bowel sounds.  GENITOURINARY: Bladder non tender, not distended  MUSCULOSKELETAL: No abnormal joints or musculature NEUROLOGIC: Cranial nerves 2-12 grossly intact. Moves all extremities PSYCHIATRIC: Mood and affect appropriate to situation with dementia, no behavioral issues  Patient Active Problem List   Diagnosis Date Noted  . Osteoporosis 03/18/2016  . Depression 12/23/2015  . Chronic diastolic heart failure (HCC) 09/05/2015  . Right leg pain 08/05/2015  . COPD (chronic obstructive pulmonary disease) (HCC) 06/25/2015  . Loss of weight 01/21/2015  . Anemia, iron deficiency 01/05/2015  . Acute upper respiratory infection 01/05/2015  . 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 Iron deficiency anemia 12/10/2014  . Stomach discomfort 11/30/2014  . Subacute confusional state 11/30/2014  . Acute respiratory failure with hypoxia (HCC) 11/11/2014  . AKI (acute kidney injury) (HCC) 11/11/2014  .  Hypernatremia 11/11/2014  . Hypokalemia 11/09/2014  . COPD exacerbation (HCC)   . Acute on chronic diastolic heart failure (HCC)   . Hypoxia   . SOB (shortness of breath)   . Pressure ulcer stage II 11/02/2014  . Palliative care encounter 11/02/2014  . HCAP (healthcare-associated pneumonia) 11/01/2014  . Leukocytosis 10/15/2014  . Closed fracture of part of upper end of humerus 10/14/2014  . Poor balance 11/25/2013  . History of stroke 11/25/2013  . 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 Myalgia 09/05/2012  . Disequilibrium 09/05/2012  . Hypertension, essential 09/05/2012  . 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 Mobility impaired 07/15/2012  . Physical exam, routine 12/07/2011  . Cerumen impaction 04/04/2011  . Trapezius muscle spasm 04/04/2011  . Hypertensive heart disease with CHF (congestive heart failure) (HCC) 11/30/2010  . Hyperlipidemia 11/30/2010  . CVA (cerebral infarction) 11/30/2010  . Postmenopausal  HRT (hormone replacement therapy) 11/30/2010  . Anxiety 11/30/2010  . Ankle weakness 11/30/2010    CMP     Component Value Date/Time   NA 138 03/01/2016   K 4.3 03/01/2016   CL 102 10/13/2015 1505   CO2 22 10/13/2015 1505   GLUCOSE 118 (H) 10/13/2015 1505   BUN 28 (A) 03/01/2016   CREATININE 0.6 03/01/2016   CREATININE 0.79 10/13/2015 1505   CALCIUM 9.1 10/13/2015 1505   PROT 6.3 10/13/2015 1505   ALBUMIN 3.8 10/13/2015 1505   ALBUMIN 3.8 10/13/2015 1505   AST 17 10/13/2015 1505   ALT 16 10/13/2015 1505   ALKPHOS 88 03/01/2016   BILITOT 0.6 10/13/2015 1505   GFRNONAA >60 11/05/2014 0225   GFRAA >60 11/05/2014 0225    Recent Labs  10/06/15 10/13/15 1505 03/01/16  NA 139 138 138  K 4.6 4.3 4.3  CL  --  102  --   CO2  --  22  --   GLUCOSE  --  118*  --   BUN 25* 29* 28*  CREATININE 0.5 0.79 0.6  CALCIUM  --  9.1  --     Recent Labs  09/03/15 10/13/15 1505 03/01/16  AST 15 17  --   ALT 14 16  --   ALKPHOS 73 81 88  BILITOT  --  0.6   --   PROT  --  6.3  --   ALBUMIN  --  3.8  3.8  --     Recent Labs  09/01/15 10/13/15 1505 02/29/16  WBC 7.0 10.3 7.6  HGB 12.0 12.6 13.0  HCT 37 37.5 39  MCV  --  88.2  --   PLT 185 183 182    Recent Labs  09/03/15 03/01/16  CHOL 150 160  LDLCALC 77 58  TRIG 145 318*   No results found for: MICROALBUR Lab Results  Component Value Date   TSH 2.31 02/29/2016   Lab Results  Component Value Date   HGBA1C 5.4 02/29/2016   Lab Results  Component Value Date   CHOL 160 03/01/2016   HDL 38 03/01/2016   LDLCALC 58 03/01/2016   LDLDIRECT 190.1 05/06/2014   TRIG 318 (A) 03/01/2016   CHOLHDL 7 05/06/2014    Significant Diagnostic Results in last 30 days:  No results found.  Assessment and Plan  CHF/weight gain - pt has no sx of fluid overload; pt has no rales or peripheral edema; I think this is a natural weight gain; will cont to monitor    Time spent > 25 min Anne D. [name], MD

## 2016-05-07 ENCOUNTER — Encounter: Payer: Self-pay | Admitting: Internal Medicine

## 2016-05-07 NOTE — Assessment & Plan Note (Signed)
Pt appears calm and comfortable; plan to cont ativan 0.5 mg q8

## 2016-05-07 NOTE — Assessment & Plan Note (Signed)
Stable; plan to cont remeron 15 mg qHS and lexapro 10 mg daily

## 2016-05-07 NOTE — Assessment & Plan Note (Signed)
One point off meeting goal for any age;plan to cont atenolol 50 mg BID and norvasc 10 mg daily

## 2016-05-19 ENCOUNTER — Encounter: Payer: Self-pay | Admitting: Internal Medicine

## 2016-05-19 ENCOUNTER — Non-Acute Institutional Stay (SKILLED_NURSING_FACILITY): Payer: Medicare Other | Admitting: Internal Medicine

## 2016-05-19 DIAGNOSIS — I5032 Chronic diastolic (congestive) heart failure: Secondary | ICD-10-CM

## 2016-05-19 DIAGNOSIS — J42 Unspecified chronic bronchitis: Secondary | ICD-10-CM

## 2016-05-19 DIAGNOSIS — F0280 Dementia in other diseases classified elsewhere without behavioral disturbance: Secondary | ICD-10-CM

## 2016-05-19 DIAGNOSIS — G20 Parkinson's disease: Secondary | ICD-10-CM

## 2016-05-19 NOTE — Progress Notes (Signed)
Location:  [facility] Room Number: 411P Place of Service:  SNF ([phone])  Beverly Black. [name], MD  Patient Care Team: Sheliah Hatch, MD as PCP - General [facility])  Extended Emergency Contact Information Primary Emergency Contact: Beverly,Black Address: [phone]          [place]ly [place] [address] [place] of [place] Home Phone: [phone] Relation: Daughter Secondary Emergency Contact: Beverly Black [place] of [place] Home Phone: [phone] Relation: Daughter    Allergies: Sulfa antibiotics  Chief Complaint  Patient presents with  . Medical Management of Chronic Issues    Routine Visit    HPI: Patient is 83 y.o. female who is being seen for routine issues of of CHF, COPD, and dementia.  Past Medical History:  Diagnosis Date  . Aneurysm (HCC)    of the eye  . Decubitus ulcer of sacral region, stage 4 (HCC)   . Hyperlipidemia   . Hypertension   . Osteoporosis   . Stroke [facility]) 10/2008    Past Surgical History:  Procedure Laterality Date  . EYE SURGERY    . PARTIAL HYSTERECTOMY  1968    Allergies as of 05/19/2016      Reactions   Sulfa Antibiotics    Cant remember reaction      Medication List       Accurate as of 05/19/16 11:59 PM. Always use your most recent med list.          amLODipine 10 MG tablet Commonly known as:  NORVASC TAKE 1 TABLET AT BEDTIME   aspirin 81 MG chewable tablet Chew 81 mg by mouth daily.   atenolol 50 MG tablet Commonly known as:  TENORMIN TAKE 1 TABLET TWICE A DAY   atorvastatin 40 MG tablet Commonly known as:  LIPITOR TAKE 1 TABLET DAILY   cholecalciferol 1000 units tablet Commonly known as:  VITAMIN D Take 1,000 Units by mouth daily.   docusate sodium 100 MG capsule Commonly known as:  COLACE Take 100 mg by mouth 2 (two) times daily.   escitalopram 10 MG tablet Commonly known as:  LEXAPRO Take 10 mg by mouth daily.   ferrous sulfate 325 (65 FE) MG  tablet Take 325 mg by mouth daily with breakfast.   fexofenadine 180 MG tablet Commonly known as:  ALLEGRA Take 180 mg by mouth daily.   LORazepam 0.5 MG tablet Commonly known as:  ATIVAN Take 1 tablet (0.5 mg total) by mouth every 8 (eight) hours.   mirtazapine 15 MG tablet Commonly known as:  REMERON Take 15 mg by mouth at bedtime.   MULTIVITAMIN ADULT PO Take 1 tablet by mouth daily.   NAMENDA XR 28 MG Cp24 24 hr capsule Generic drug:  memantine Take 28 mg by mouth daily.   NUTRITIONAL SUPPLEMENT PO Give Medpass 4 oz by mouth twice daily   oxyCODONE 5 MG immediate release tablet Commonly known as:  Oxy IR/ROXICODONE Take 1 tablet (5 mg total) by mouth every 6 (six) hours as needed for severe pain.   pantoprazole 40 MG tablet Commonly known as:  PROTONIX Take 1 tablet (40 mg total) by mouth 2 (two) times daily.   polyethylene glycol packet Commonly known as:  MIRALAX / GLYCOLAX Take 17 g by mouth daily. To be hold for diarrhea   Potassium Chloride ER 20 MEQ Tbcr Take 1 tablet by mouth daily.   PULMICORT 0.25 MG/2ML nebulizer solution Generic drug:  budesonide Take 0.25 mg by nebulization every  4 (four) hours as needed.   sennosides-docusate sodium 8.6-50 MG tablet Commonly known as:  SENOKOT-S Take 1 tablet by mouth 2 (two) times daily.   traMADol 50 MG tablet Commonly known as:  ULTRAM Give 1 tablet by mouth every 8 hours scheduled for pain   vitamin C 500 MG tablet Commonly known as:  ASCORBIC ACID Take 500 mg by mouth 2 (two) times daily.   ZETIA 10 MG tablet Generic drug:  ezetimibe TAKE 1 TABLET DAILY       No orders of the defined types were placed in this encounter.   Immunization History  Administered Date(s) Administered  . Influenza,inj,Quad PF,36+ Mos 04/16/2013, 05/06/2014  . Influenza-Unspecified 02/03/2015, 02/08/2016  . PPD Test 10/14/2014    Social History  Substance Use Topics  . Smoking status: Former Smoker    Packs/day:  0.50    Years: 50.00  . Smokeless tobacco: Never Used  . Alcohol use No    Review of Systems  DATA OBTAINED: from nurse GENERAL:  no fevers, fatigue, appetite changes SKIN: No itching, rash HEENT: No complaint RESPIRATORY: No cough, wheezing, SOB CARDIAC: No chest pain, palpitations, lower extremity edema  GI: No abdominal pain, No N/V/D or constipation, No heartburn or reflux  GU: No dysuria, frequency or urgency, or incontinence  MUSCULOSKELETAL: No unrelieved bone/joint pain NEUROLOGIC: No headache, dizziness  PSYCHIATRIC: No overt anxiety or sadness  Vitals:   05/19/16 0919  BP: (!) 141/70  Pulse: 62  Resp: 18  Temp: 98.3 F (36.8 C)   Body mass index is 22.49 kg/m. Physical Exam  GENERAL APPEARANCE: Alert, conversant, No acute distress  SKIN: No diaphoresis rash HEENT: Unremarkable RESPIRATORY: Breathing is even, unlabored. Lung sounds are clear   CARDIOVASCULAR: Heart RRR no murmurs, rubs or gallops. No peripheral edema  GASTROINTESTINAL: Abdomen is soft, non-tender, not distended w/ normal bowel sounds.  GENITOURINARY: Bladder non tender, not distended  MUSCULOSKELETAL: No abnormal joints or musculature NEUROLOGIC: Cranial nerves 2-12 grossly intact. Moves all extremities PSYCHIATRIC: Mood and affect appropriate to situation, no behavioral issues  Patient Active Problem List   Diagnosis Date Noted  . Osteoporosis 03/18/2016  . Depression 12/23/2015  . Chronic diastolic heart failure (HCC) 09/05/2015  . Right leg pain 08/05/2015  . COPD (chronic obstructive pulmonary disease) (HCC) 06/25/2015  . Loss of weight 01/21/2015  . Anemia, iron deficiency 01/05/2015  . Acute upper respiratory infection 01/05/2015  . 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 Iron deficiency anemia 12/10/2014  . Stomach discomfort 11/30/2014  . Subacute confusional state 11/30/2014  . Acute respiratory failure with hypoxia (HCC)  11/11/2014  . AKI (acute kidney injury) (HCC) 11/11/2014  . Hypernatremia 11/11/2014  . Hypokalemia 11/09/2014  . COPD exacerbation (HCC)   . Acute on chronic diastolic heart failure (HCC)   . Hypoxia   . SOB (shortness of breath)   . Pressure ulcer stage II 11/02/2014  . Palliative care encounter 11/02/2014  . HCAP (healthcare-associated pneumonia) 11/01/2014  . Leukocytosis 10/15/2014  . Closed fracture of part of upper end of humerus 10/14/2014  . Poor balance 11/25/2013  . History of stroke 11/25/2013  . 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 Myalgia 09/05/2012  . Disequilibrium 09/05/2012  . Hypertension, essential 09/05/2012  . 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 Mobility impaired 07/15/2012  . Physical exam, routine 12/07/2011  . Cerumen impaction 04/04/2011  . Trapezius muscle spasm 04/04/2011  . Hypertensive heart disease with CHF (congestive heart failure) (HCC) 11/30/2010  .  Hyperlipidemia 11/30/2010  . CVA (cerebral infarction) 11/30/2010  . Postmenopausal HRT (hormone replacement therapy) 11/30/2010  . Anxiety 11/30/2010  . Ankle weakness 11/30/2010    CMP     Component Value Date/Time   NA 138 03/01/2016   K 4.3 03/01/2016   CL 102 10/13/2015 1505   CO2 22 10/13/2015 1505   GLUCOSE 118 (H) 10/13/2015 1505   BUN 28 (A) 03/01/2016   CREATININE 0.6 03/01/2016   CREATININE 0.79 10/13/2015 1505   CALCIUM 9.1 10/13/2015 1505   PROT 6.3 10/13/2015 1505   ALBUMIN 3.8 10/13/2015 1505   ALBUMIN 3.8 10/13/2015 1505   AST 17 10/13/2015 1505   ALT 16 10/13/2015 1505   ALKPHOS 88 03/01/2016   BILITOT 0.6 10/13/2015 1505   GFRNONAA >60 11/05/2014 0225   GFRAA >60 11/05/2014 0225    Recent Labs  10/06/15 10/13/15 1505 03/01/16  NA 139 138 138  K 4.6 4.3 4.3  CL  --  102  --   CO2  --  22  --   GLUCOSE  --  118*  --   BUN 25* 29* 28*  CREATININE 0.5 0.79 0.6  CALCIUM  --  9.1  --     Recent Labs  09/03/15 10/13/15 1505 03/01/16  AST 15 17   --   ALT 14 16  --   ALKPHOS 73 81 88  BILITOT  --  0.6  --   PROT  --  6.3  --   ALBUMIN  --  3.8  3.8  --     Recent Labs  09/01/15 10/13/15 1505 02/29/16  WBC 7.0 10.3 7.6  HGB 12.0 12.6 13.0  HCT 37 37.5 39  MCV  --  88.2  --   PLT 185 183 182    Recent Labs  09/03/15 03/01/16  CHOL 150 160  LDLCALC 77 58  TRIG 145 318*   No results found for: MICROALBUR Lab Results  Component Value Date   TSH 2.31 02/29/2016   Lab Results  Component Value Date   HGBA1C 5.4 02/29/2016   Lab Results  Component Value Date   CHOL 160 03/01/2016   HDL 38 03/01/2016   LDLCALC 58 03/01/2016   LDLDIRECT 190.1 05/06/2014   TRIG 318 (A) 03/01/2016   CHOLHDL 7 05/06/2014    Significant Diagnostic Results in last 30 days:  No results found.  Assessment and Plan  Chronic diastolic heart failure (HCC) No recent reported exacerbations;plan to cont temormin 50 mg BID  COPD (chronic obstructive pulmonary disease) (HCC) No reported exacerbations;only chronic med is allegra, will cont   Dementia due to Parkinson's disease without behavioral disturbance (HCC) Chronic and stable;plan to cont namenda XR 28 mg daily     Graeden Bitner D. [name], MD

## 2016-05-21 ENCOUNTER — Encounter: Payer: Self-pay | Admitting: Internal Medicine

## 2016-05-21 NOTE — Assessment & Plan Note (Signed)
No recent reported exacerbations;plan to cont temormin 50 mg BID

## 2016-05-21 NOTE — Assessment & Plan Note (Signed)
No reported exacerbations;only chronic med is Careers adviserallegra, will cont

## 2016-05-21 NOTE — Assessment & Plan Note (Signed)
Chronic and stable;plan to cont namenda XR 28 mg daily

## 2016-06-01 ENCOUNTER — Non-Acute Institutional Stay (SKILLED_NURSING_FACILITY): Payer: Medicare Other | Admitting: Internal Medicine

## 2016-06-01 DIAGNOSIS — B309 Viral conjunctivitis, unspecified: Secondary | ICD-10-CM

## 2016-06-05 ENCOUNTER — Non-Acute Institutional Stay (SKILLED_NURSING_FACILITY): Payer: Medicare Other | Admitting: Internal Medicine

## 2016-06-05 ENCOUNTER — Encounter: Payer: Self-pay | Admitting: Internal Medicine

## 2016-06-05 DIAGNOSIS — T7840XA Allergy, unspecified, initial encounter: Secondary | ICD-10-CM | POA: Diagnosis not present

## 2016-06-05 DIAGNOSIS — B309 Viral conjunctivitis, unspecified: Secondary | ICD-10-CM

## 2016-06-05 NOTE — Progress Notes (Signed)
Location:  [facility] Room Number: 411P Place of Service:  SNF ([phone])  [name]. [name], MD  Patient Care Team: Sheliah Hatch, MD as PCP - General [facility])  Extended Emergency Contact Information Primary Emergency Contact: [name],[name] Address: [phone]          [name] [address] [name] of [place] Home Phone: [phone] Relation: Daughter Secondary Emergency Contact: [name] [place] Home Phone: [phone] Relation: Daughter    Allergies: Sulfa antibiotics  Chief Complaint  Patient presents with  . Acute Visit    Acute    HPI: Patient is 83 y.o. female who was seen on 1/25 for conjunctivitis. She was started on gentamycin drops. Today I was asked to see pt because she now has redness on her eyelids, upper and lower B. Pt denies itching or pain.  Past Medical History:  Diagnosis Date  . Aneurysm (HCC)    of the eye  . Decubitus ulcer of sacral region, stage 4 (HCC)   . Hyperlipidemia   . Hypertension   . Osteoporosis   . Stroke [facility]) 10/2008    Past Surgical History:  Procedure Laterality Date  . EYE SURGERY    . PARTIAL HYSTERECTOMY  1968    Allergies as of 06/05/2016      Reactions   Sulfa Antibiotics    Cant remember reaction      Medication List       Accurate as of 06/05/16  4:17 PM. Always use your most recent med list.          amLODipine 10 MG tablet Commonly known as:  NORVASC TAKE 1 TABLET AT BEDTIME   aspirin 81 MG chewable tablet Chew 81 mg by mouth daily.   atenolol 50 MG tablet Commonly known as:  TENORMIN TAKE 1 TABLET TWICE A DAY   atorvastatin 40 MG tablet Commonly known as:  LIPITOR TAKE 1 TABLET DAILY   cholecalciferol 1000 units tablet Commonly known as:  VITAMIN D Take 1,000 Units by mouth daily.   docusate sodium 100 MG capsule Commonly known as:  COLACE Take 100 mg by mouth 2 (two) times daily.   escitalopram 10 MG  tablet Commonly known as:  LEXAPRO Take 10 mg by mouth daily.   ferrous sulfate 325 (65 FE) MG tablet Take 325 mg by mouth daily with breakfast.   fexofenadine 180 MG tablet Commonly known as:  ALLEGRA Take 180 mg by mouth daily.   LORazepam 0.5 MG tablet Commonly known as:  ATIVAN Take 1 tablet (0.5 mg total) by mouth every 8 (eight) hours.   mirtazapine 15 MG tablet Commonly known as:  REMERON Take 15 mg by mouth at bedtime.   MULTIVITAMIN ADULT PO Take 1 tablet by mouth daily.   NAMENDA XR 28 MG Cp24 24 hr capsule Generic drug:  memantine Take 28 mg by mouth daily.   NUTRITIONAL SUPPLEMENT PO Give Medpass 4 oz by mouth twice daily   oxyCODONE 5 MG immediate release tablet Commonly known as:  Oxy IR/ROXICODONE Take 1 tablet (5 mg total) by mouth every 6 (six) hours as needed for severe pain.   pantoprazole 40 MG tablet Commonly known as:  PROTONIX Take 1 tablet (40 mg total) by mouth 2 (two) times daily.   polyethylene glycol packet Commonly known as:  MIRALAX / GLYCOLAX Take 17 g by mouth daily. To be hold for diarrhea   Potassium Chloride ER 20 MEQ Tbcr Take 1  tablet by mouth daily.   PULMICORT 0.25 MG/2ML nebulizer solution Generic drug:  budesonide Take 0.25 mg by nebulization every 4 (four) hours as needed.   sennosides-docusate sodium 8.6-50 MG tablet Commonly known as:  SENOKOT-S Take 1 tablet by mouth 2 (two) times daily.   traMADol 50 MG tablet Commonly known as:  ULTRAM Give 1 tablet by mouth every 8 hours scheduled for pain   vitamin C 500 MG tablet Commonly known as:  ASCORBIC ACID Take 500 mg by mouth 2 (two) times daily.   ZETIA 10 MG tablet Generic drug:  ezetimibe TAKE 1 TABLET DAILY       No orders of the defined types were placed in this encounter.   Immunization History  Administered Date(s) Administered  . Influenza,inj,Quad PF,36+ Mos 04/16/2013, 05/06/2014  . Influenza-Unspecified 02/03/2015, 02/08/2016  . PPD Test  10/14/2014    Social History  Substance Use Topics  . Smoking status: Former Smoker    Packs/day: 0.50    Years: 50.00  . Smokeless tobacco: Never Used  . Alcohol use No    Review of Systems  DATA OBTAINED: from patient, nurse - as per HPI GENERAL:  no fevers, fatigue, appetite changes SKIN: No itching, rash HEENT: HPI RESPIRATORY: No cough, wheezing, SOB CARDIAC: No chest pain, palpitations, lower extremity edema  GI: No abdominal pain, No N/V/D or constipation, No heartburn or reflux  GU: No dysuria, frequency or urgency, or incontinence  MUSCULOSKELETAL: No unrelieved bone/joint pain NEUROLOGIC: No headache, dizziness  PSYCHIATRIC: No overt anxiety or sadness  Vitals:   06/05/16 1615  BP: 137/68  Pulse: 62  Resp: 18  Temp: 97.6 F (36.4 C)   Body mass index is 23.1 kg/m. Physical Exam  GENERAL APPEARANCE: Alert, mod conversant, No acute distress  SKIN: No diaphoresis rash HEENT: C conjunctiva less red than prior ; B upper and lower lids are red ans swollen, no heat, not TTP RESPIRATORY: Breathing is even, unlabored. Lung sounds are clear   CARDIOVASCULAR: Heart RRR no murmurs, rubs or gallops. No peripheral edema  GASTROINTESTINAL: Abdomen is soft, non-tender, not distended w/ normal bowel sounds.  GENITOURINARY: Bladder non tender, not distended  MUSCULOSKELETAL: No abnormal joints or musculature NEUROLOGIC: Cranial nerves 2-12 grossly intact. Moves all extremities PSYCHIATRIC: Mood and affect appropriate to situation with dementia, no behavioral issues  Patient Active Problem List   Diagnosis Date Noted  . Osteoporosis 03/18/2016  . Depression 12/23/2015  . Chronic diastolic heart failure (HCC) 09/05/2015  . Right leg pain 08/05/2015  . COPD (chronic obstructive pulmonary disease) (HCC) 06/25/2015  . Loss of weight 01/21/2015  . Anemia, iron deficiency 01/05/2015  . Acute upper respiratory infection 01/05/2015  . Dementia due to Parkinson's disease  without behavioral disturbance (HCC) 12/31/2014  . Iron deficiency anemia 12/10/2014  . Stomach discomfort 11/30/2014  . Subacute confusional state 11/30/2014  . Acute respiratory failure with hypoxia (HCC) 11/11/2014  . AKI (acute kidney injury) (HCC) 11/11/2014  . Hypernatremia 11/11/2014  . Hypokalemia 11/09/2014  . COPD exacerbation (HCC)   . Acute on chronic diastolic heart failure (HCC)   . Hypoxia   . SOB (shortness of breath)   . Pressure ulcer stage II 11/02/2014  . Palliative care encounter 11/02/2014  . HCAP (healthcare-associated pneumonia) 11/01/2014  . Leukocytosis 10/15/2014  . Closed fracture of part of upper end of humerus 10/14/2014  . Poor balance 11/25/2013  . History of stroke 11/25/2013  . 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 Myalgia 09/05/2012  .  Disequilibrium 09/05/2012  . Hypertension, essential 09/05/2012  . 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 Mobility impaired 07/15/2012  . Physical exam, routine 12/07/2011  . Cerumen impaction 04/04/2011  . Trapezius muscle spasm 04/04/2011  . Hypertensive heart disease with CHF (congestive heart failure) (HCC) 11/30/2010  . Hyperlipidemia 11/30/2010  . CVA (cerebral infarction) 11/30/2010  . Postmenopausal HRT (hormone replacement therapy) 11/30/2010  . Anxiety 11/30/2010  . Ankle weakness 11/30/2010    CMP     Component Value Date/Time   NA 138 03/01/2016   K 4.3 03/01/2016   CL 102 10/13/2015 1505   CO2 22 10/13/2015 1505   GLUCOSE 118 (H) 10/13/2015 1505   BUN 28 (A) 03/01/2016   CREATININE 0.6 03/01/2016   CREATININE 0.79 10/13/2015 1505   CALCIUM 9.1 10/13/2015 1505   PROT 6.3 10/13/2015 1505   ALBUMIN 3.8 10/13/2015 1505   ALBUMIN 3.8 10/13/2015 1505   AST 17 10/13/2015 1505   ALT 16 10/13/2015 1505   ALKPHOS 88 03/01/2016   BILITOT 0.6 10/13/2015 1505   GFRNONAA >60 11/05/2014 0225   GFRAA >60 11/05/2014 0225    Recent Labs  10/06/15 10/13/15 1505 03/01/16  NA 139 138 138    K 4.6 4.3 4.3  CL  --  102  --   CO2  --  22  --   GLUCOSE  --  118*  --   BUN 25* 29* 28*  CREATININE 0.5 0.79 0.6  CALCIUM  --  9.1  --     Recent Labs  09/03/15 10/13/15 1505 03/01/16  AST 15 17  --   ALT 14 16  --   ALKPHOS 73 81 88  BILITOT  --  0.6  --   PROT  --  6.3  --   ALBUMIN  --  3.8  3.8  --     Recent Labs  09/01/15 10/13/15 1505 02/29/16  WBC 7.0 10.3 7.6  HGB 12.0 12.6 13.0  HCT 37 37.5 39  MCV  --  88.2  --   PLT 185 183 182    Recent Labs  09/03/15 03/01/16  CHOL 150 160  LDLCALC 77 58  TRIG 145 318*   No results found for: MICROALBUR Lab Results  Component Value Date   TSH 2.31 02/29/2016   Lab Results  Component Value Date   HGBA1C 5.4 02/29/2016   Lab Results  Component Value Date   CHOL 160 03/01/2016   HDL 38 03/01/2016   LDLCALC 58 03/01/2016   LDLDIRECT 190.1 05/06/2014   TRIG 318 (A) 03/01/2016   CHOLHDL 7 05/06/2014    Significant Diagnostic Results in last 30 days:  No results found.  Assessment and Plan  ALLERGIC RX / CONJUNCTIVITIS- I think pt is having allergix response to gentamycin; d/c gentamycin drops and start cipro drops instead 2 ggts q4 for 7 days; will monitor;unlikely to be cellulitis as it is B   Thurston HoleAnne D. Lyn HollingsheadAlexander, MD

## 2016-06-07 ENCOUNTER — Non-Acute Institutional Stay (SKILLED_NURSING_FACILITY): Payer: Medicare Other | Admitting: Internal Medicine

## 2016-06-07 DIAGNOSIS — Z20828 Contact with and (suspected) exposure to other viral communicable diseases: Secondary | ICD-10-CM

## 2016-06-11 ENCOUNTER — Encounter: Payer: Self-pay | Admitting: Internal Medicine

## 2016-06-11 NOTE — Progress Notes (Signed)
Location:    [facility]   Place of Service:   [facility]  Beverly Black. Beverly Black  Patient Care Team: Sheliah Hatch, MD as PCP - General (Family Medicine)  Extended Emergency Contact Information Primary Emergency Contact: [name],[name] Address: [address]          [name] [address] [name] Home Phone: [phone] Relation: Daughter Secondary Emergency Contact: [name] [place] Home Phone: [phone] Relation: Daughter    Allergies: Sulfa antibiotics  Chief Complaint  Patient presents with  . Acute Visit    HPI: Patient is 83 y.o. female who is being seen for redness of both eye noted today. Pt admits a little pain, no itching. Nursing denies fever, cold or coughs.  Past Medical History:  Diagnosis Date  . Aneurysm (HCC)    of the eye  . Decubitus ulcer of sacral region, stage 4 (HCC)   . Hyperlipidemia   . Hypertension   . Osteoporosis   . Stroke [facility]) 10/2008    Past Surgical History:  Procedure Laterality Date  . EYE SURGERY    . PARTIAL HYSTERECTOMY  1968    Allergies as of 06/01/2016      Reactions   Sulfa Antibiotics    Cant remember reaction      Medication List       Accurate as of 06/01/16 11:59 PM. Always use your most recent med list.          amLODipine 10 MG tablet Commonly known as:  NORVASC TAKE 1 TABLET AT BEDTIME   aspirin 81 MG chewable tablet Chew 81 mg by mouth daily.   atenolol 50 MG tablet Commonly known as:  TENORMIN TAKE 1 TABLET TWICE A DAY   atorvastatin 40 MG tablet Commonly known as:  LIPITOR TAKE 1 TABLET DAILY   cholecalciferol 1000 units tablet Commonly known as:  VITAMIN D Take 1,000 Units by mouth daily.   docusate sodium 100 MG capsule Commonly known as:  COLACE Take 100 mg by mouth 2 (two) times daily.   escitalopram 10 MG tablet Commonly known as:  LEXAPRO Take 10 mg by mouth daily.   ferrous sulfate 325 (65  FE) MG tablet Take 325 mg by mouth daily with breakfast.   fexofenadine 180 MG tablet Commonly known as:  ALLEGRA Take 180 mg by mouth daily.   LORazepam 0.5 MG tablet Commonly known as:  ATIVAN Take 1 tablet (0.5 mg total) by mouth every 8 (eight) hours.   mirtazapine 15 MG tablet Commonly known as:  REMERON Take 15 mg by mouth at bedtime.   MULTIVITAMIN ADULT PO Take 1 tablet by mouth daily.   NAMENDA XR 28 MG Cp24 24 hr capsule Generic drug:  memantine Take 28 mg by mouth daily.   NUTRITIONAL SUPPLEMENT PO Give Medpass 4 oz by mouth twice daily   oxyCODONE 5 MG immediate release tablet Commonly known as:  Oxy IR/ROXICODONE Take 1 tablet (5 mg total) by mouth every 6 (six) hours as needed for severe pain.   pantoprazole 40 MG tablet Commonly known as:  PROTONIX Take 1 tablet (40 mg total) by mouth 2 (two) times daily.   polyethylene glycol packet Commonly known as:  MIRALAX / GLYCOLAX Take 17 g by mouth daily. To be hold for diarrhea   Potassium Chloride ER 20 MEQ Tbcr Take 1 tablet by mouth daily.   PULMICORT 0.25 MG/2ML nebulizer solution Generic drug:  budesonide Take  0.25 mg by nebulization every 4 (four) hours as needed.   sennosides-docusate sodium 8.6-50 MG tablet Commonly known as:  SENOKOT-S Take 1 tablet by mouth 2 (two) times daily.   traMADol 50 MG tablet Commonly known as:  ULTRAM Give 1 tablet by mouth every 8 hours scheduled for pain   vitamin C 500 MG tablet Commonly known as:  ASCORBIC ACID Take 500 mg by mouth 2 (two) times daily.   ZETIA 10 MG tablet Generic drug:  ezetimibe TAKE 1 TABLET DAILY       No orders of the defined types were placed in this encounter.   Immunization History  Administered Date(s) Administered  . Influenza,inj,Quad PF,36+ Mos 04/16/2013, 05/06/2014  . Influenza-Unspecified 02/03/2015, 02/08/2016  . PPD Test 10/14/2014    Social History  Substance Use Topics  . Smoking status: Former Smoker     Packs/day: 0.50    Years: 50.00  . Smokeless tobacco: Never Used  . Alcohol use No    Review of Systems  DATA OBTAINED: from patient, nurse- asper HPI GENERAL:  no fevers, fatigue, appetite changes SKIN: No itching, rash HEENT: No complaint RESPIRATORY: No cough, wheezing, SOB CARDIAC: No chest pain, palpitations, lower extremity edema  GI: No abdominal pain, No N/V/D or constipation, No heartburn or reflux  GU: No dysuria, frequency or urgency, or incontinence  MUSCULOSKELETAL: No unrelieved bone/joint pain NEUROLOGIC: No headache, dizziness  PSYCHIATRIC: No overt anxiety or sadness  Vitals:   06/01/16 1032  BP: 139/69  Pulse: 63  Resp: 20  Temp: 97.6 F (36.4 C)   Body mass index is 23.17 kg/m. Physical Exam  GENERAL APPEARANCE: Alert, mod conversant, No acute distress  SKIN: No diaphoresis rash HEENT: B conjunctiva mod injected, small d/c L eye RESPIRATORY: Breathing is even, unlabored. Lung sounds are clear   CARDIOVASCULAR: Heart RRR no murmurs, rubs or gallops. No peripheral edema  GASTROINTESTINAL: Abdomen is soft, non-tender, not distended w/ normal bowel sounds.  GENITOURINARY: Bladder non tender, not distended  MUSCULOSKELETAL: No abnormal joints or musculature NEUROLOGIC: Cranial nerves 2-12 grossly intact. Moves all extremities PSYCHIATRIC: Mood and affect appropriate to situation with dementia, no behavioral issues  Patient Active Problem List   Diagnosis Date Noted  . Osteoporosis 03/18/2016  . Depression 12/23/2015  . Chronic diastolic heart failure (HCC) 09/05/2015  . Right leg pain 08/05/2015  . COPD (chronic obstructive pulmonary disease) (HCC) 06/25/2015  . Loss of weight 01/21/2015  . Anemia, iron deficiency 01/05/2015  . Acute upper respiratory infection 01/05/2015  . 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 Iron deficiency anemia 12/10/2014  . Stomach discomfort 11/30/2014  . Subacute  confusional state 11/30/2014  . Acute respiratory failure with hypoxia (HCC) 11/11/2014  . AKI (acute kidney injury) (HCC) 11/11/2014  . Hypernatremia 11/11/2014  . Hypokalemia 11/09/2014  . COPD exacerbation (HCC)   . Acute on chronic diastolic heart failure (HCC)   . Hypoxia   . SOB (shortness of breath)   . Pressure ulcer stage II 11/02/2014  . Palliative care encounter 11/02/2014  . HCAP (healthcare-associated pneumonia) 11/01/2014  . Leukocytosis 10/15/2014  . Closed fracture of part of upper end of humerus 10/14/2014  . Poor balance 11/25/2013  . History of stroke 11/25/2013  . 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 Myalgia 09/05/2012  . Disequilibrium 09/05/2012  . Hypertension, essential 09/05/2012  . 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 Mobility impaired 07/15/2012  . Physical exam, routine 12/07/2011  . Cerumen impaction 04/04/2011  .  Trapezius muscle spasm 04/04/2011  . Hypertensive heart disease with CHF (congestive heart failure) (HCC) 11/30/2010  . Hyperlipidemia 11/30/2010  . CVA (cerebral infarction) 11/30/2010  . Postmenopausal HRT (hormone replacement therapy) 11/30/2010  . Anxiety 11/30/2010  . Ankle weakness 11/30/2010    CMP     Component Value Date/Time   NA 138 03/01/2016   K 4.3 03/01/2016   CL 102 10/13/2015 1505   CO2 22 10/13/2015 1505   GLUCOSE 118 (H) 10/13/2015 1505   BUN 28 (A) 03/01/2016   CREATININE 0.6 03/01/2016   CREATININE 0.79 10/13/2015 1505   CALCIUM 9.1 10/13/2015 1505   PROT 6.3 10/13/2015 1505   ALBUMIN 3.8 10/13/2015 1505   ALBUMIN 3.8 10/13/2015 1505   AST 17 10/13/2015 1505   ALT 16 10/13/2015 1505   ALKPHOS 88 03/01/2016   BILITOT 0.6 10/13/2015 1505   GFRNONAA >60 11/05/2014 0225   GFRAA >60 11/05/2014 0225    Recent Labs  10/06/15 10/13/15 1505 03/01/16  NA 139 138 138  K 4.6 4.3 4.3  CL  --  102  --   CO2  --  22  --   GLUCOSE  --  118*  --   BUN 25* 29* 28*  CREATININE 0.5 0.79 0.6  CALCIUM   --  9.1  --     Recent Labs  09/03/15 10/13/15 1505 03/01/16  AST 15 17  --   ALT 14 16  --   ALKPHOS 73 81 88  BILITOT  --  0.6  --   PROT  --  6.3  --   ALBUMIN  --  3.8  3.8  --     Recent Labs  09/01/15 10/13/15 1505 02/29/16  WBC 7.0 10.3 7.6  HGB 12.0 12.6 13.0  HCT 37 37.5 39  MCV  --  88.2  --   PLT 185 183 182    Recent Labs  09/03/15 03/01/16  CHOL 150 160  LDLCALC 77 58  TRIG 145 318*   No results found for: MICROALBUR Lab Results  Component Value Date   TSH 2.31 02/29/2016   Lab Results  Component Value Date   HGBA1C 5.4 02/29/2016   Lab Results  Component Value Date   CHOL 160 03/01/2016   HDL 38 03/01/2016   LDLCALC 58 03/01/2016   LDLDIRECT 190.1 05/06/2014   TRIG 318 (A) 03/01/2016   CHOLHDL 7 05/06/2014    Significant Diagnostic Results in last 30 days:  No results found.  Assessment and Plan  B CONJUNTIVITIS - Start gentamycin drops 4 ggts QID for 7 days   Beverly Goldsmithnne D. Lyn HollingsheadAlexander, MD

## 2016-06-19 ENCOUNTER — Encounter: Payer: Self-pay | Admitting: Internal Medicine

## 2016-06-19 ENCOUNTER — Non-Acute Institutional Stay (SKILLED_NURSING_FACILITY): Payer: Medicare Other | Admitting: Internal Medicine

## 2016-06-19 DIAGNOSIS — E782 Mixed hyperlipidemia: Secondary | ICD-10-CM | POA: Diagnosis not present

## 2016-06-19 DIAGNOSIS — M818 Other osteoporosis without current pathological fracture: Secondary | ICD-10-CM

## 2016-06-19 DIAGNOSIS — D508 Other iron deficiency anemias: Secondary | ICD-10-CM | POA: Diagnosis not present

## 2016-06-19 NOTE — Progress Notes (Signed)
Location[facility] Room Number: [id number] Place of Service:  SNF ([phone])  Randon Goldsmithnne D. Lyn HollingsheadAlexander, MD  Patient Care Team: Sheliah HatchKatherine E Tabori, MD as PCP - General [facility](Family Medicine)  Extended Emergency Contact Information Primary Emergency Contact: [name],[name] Address: [phone]          [place] [phone] [place] Home Phone: [phone] Relation: Daughter Secondary Emergency Contact: [name],[name]  [place] Home Phone: [phone] Relation: Daughter    Allergies: Sulfa antibiotics  Chief Complaint  Patient presents with  . Medical Management of Chronic Issues    Routine Visit    HPI: Patient is 83 y.o. female who is being seen for routine issues of osteoporosis, HLD and iron def anemia.  Past Medical History:  Diagnosis Date  . Aneurysm (HCC)    of the eye  . Decubitus ulcer of sacral region, stage 4 (HCC)   . Hyperlipidemia   . Hypertension   . Osteoporosis   . Stroke [facility](HCC) 10/2008    Past Surgical History:  Procedure Laterality Date  . EYE SURGERY    . PARTIAL HYSTERECTOMY  1968    Allergies as of 06/19/2016      Reactions   Sulfa Antibiotics    Cant remember reaction      Medication List       Accurate as of 06/19/16 11:59 PM. Always use your most recent med list.          amLODipine 10 MG tablet Commonly known as:  NORVASC TAKE 1 TABLET AT BEDTIME   aspirin 81 MG chewable tablet Chew 81 mg by mouth daily.   atenolol 50 MG tablet Commonly known as:  TENORMIN TAKE 1 TABLET TWICE A DAY   atorvastatin 40 MG tablet Commonly known as:  LIPITOR TAKE 1 TABLET DAILY   cholecalciferol 1000 units tablet Commonly known as:  VITAMIN D Take 1,000 Units by mouth daily.   docusate sodium 100 MG capsule Commonly known as:  COLACE Take 100 mg by mouth 2 (two) times daily.   escitalopram 10 MG tablet Commonly known as:  LEXAPRO Take 10 mg by mouth daily.   ferrous sulfate 325  (65 FE) MG tablet Take 325 mg by mouth daily with breakfast.   fexofenadine 180 MG tablet Commonly known as:  ALLEGRA Take 180 mg by mouth daily.   LORazepam 0.5 MG tablet Commonly known as:  ATIVAN Take 1 tablet (0.5 mg total) by mouth every 8 (eight) hours.   mirtazapine 15 MG tablet Commonly known as:  REMERON Take 15 mg by mouth at bedtime.   MULTIVITAMIN ADULT PO Take 1 tablet by mouth daily.   NAMENDA XR 28 MG Cp24 24 hr capsule Generic drug:  memantine Take 28 mg by mouth daily.   NUTRITIONAL SUPPLEMENT PO Give Medpass 4 oz by mouth twice daily   oxyCODONE 5 MG immediate release tablet Commonly known as:  Oxy IR/ROXICODONE Take 1 tablet (5 mg total) by mouth every 6 (six) hours as needed for severe pain.   pantoprazole 40 MG tablet Commonly known as:  PROTONIX Take 1 tablet (40 mg total) by mouth 2 (two) times daily.   polyethylene glycol packet Commonly known as:  MIRALAX / GLYCOLAX Take 17 g by mouth daily. To be hold for diarrhea   Potassium Chloride ER 20 MEQ Tbcr Take 1 tablet by mouth daily.   PULMICORT 0.25 MG/2ML nebulizer solution Generic drug:  budesonide Take 0.25 mg by nebulization  every 4 (four) hours as needed.   sennosides-docusate sodium 8.6-50 MG tablet Commonly known as:  SENOKOT-S Take 1 tablet by mouth 2 (two) times daily.   traMADol 50 MG tablet Commonly known as:  ULTRAM Give 1 tablet by mouth every 8 hours scheduled for pain   vitamin C 500 MG tablet Commonly known as:  ASCORBIC ACID Take 500 mg by mouth 2 (two) times daily.   ZETIA 10 MG tablet Generic drug:  ezetimibe TAKE 1 TABLET DAILY       No orders of the defined types were placed in this encounter.   Immunization History  Administered Date(s) Administered  . Influenza,inj,Quad PF,36+ Mos 04/16/2013, 05/06/2014  . Influenza-Unspecified 02/03/2015, 02/08/2016  . PPD Test 10/14/2014    Social History  Substance Use Topics  . Smoking status: Former Smoker     Packs/day: 0.50    Years: 50.00  . Smokeless tobacco: Never Used  . Alcohol use No    Review of Systems  DATA OBTAINED: from patient, nurse GENERAL:  no fevers, fatigue, appetite changes SKIN: No itching, rash HEENT: No complaint RESPIRATORY: No cough, wheezing, SOB CARDIAC: No chest pain, palpitations, lower extremity edema  GI: No abdominal pain, No N/V/D or constipation, No heartburn or reflux  GU: No dysuria, frequency or urgency, or incontinence  MUSCULOSKELETAL: No unrelieved bone/joint pain NEUROLOGIC: No headache, dizziness  PSYCHIATRIC: No overt anxiety or sadness  Vitals:   06/19/16 0849  BP: 139/69  Pulse: 62  Resp: 18  Temp: 97.6 F (36.4 C)   Body mass index is 23.21 kg/m. Physical Exam  GENERAL APPEARANCE: Alert, conversant, No acute distress  SKIN: No diaphoresis rash HEENT: Unremarkable RESPIRATORY: Breathing is even, unlabored. Lung sounds are clear   CARDIOVASCULAR: Heart RRR no murmurs, rubs or gallops. No peripheral edema  GASTROINTESTINAL: Abdomen is soft, non-tender, not distended w/ normal bowel sounds.  GENITOURINARY: Bladder non tender, not distended  MUSCULOSKELETAL: No abnormal joints or musculature NEUROLOGIC: Cranial nerves 2-12 grossly intact. Moves all extremities PSYCHIATRIC: Mood and affect appropriate to situation with dementia, no behavioral issues  Patient Active Problem List   Diagnosis Date Noted  . Osteoporosis 03/18/2016  . Depression 12/23/2015  . Chronic diastolic heart failure (HCC) 09/05/2015  . Right leg pain 08/05/2015  . COPD (chronic obstructive pulmonary disease) (HCC) 06/25/2015  . Loss of weight 01/21/2015  . Anemia, iron deficiency 01/05/2015  . Acute upper respiratory infection 01/05/2015  . 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 Iron deficiency anemia 12/10/2014  . Stomach discomfort 11/30/2014  . Subacute confusional state 11/30/2014  . Acute respiratory  failure with hypoxia (HCC) 11/11/2014  . AKI (acute kidney injury) (HCC) 11/11/2014  . Hypernatremia 11/11/2014  . Hypokalemia 11/09/2014  . COPD exacerbation (HCC)   . Acute on chronic diastolic heart failure (HCC)   . Hypoxia   . SOB (shortness of breath)   . Pressure ulcer stage II 11/02/2014  . Palliative care encounter 11/02/2014  . HCAP (healthcare-associated pneumonia) 11/01/2014  . Leukocytosis 10/15/2014  . Closed fracture of part of upper end of humerus 10/14/2014  . Poor balance 11/25/2013  . History of stroke 11/25/2013  . 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 Myalgia 09/05/2012  . Disequilibrium 09/05/2012  . Hypertension, essential 09/05/2012  . 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 Mobility impaired 07/15/2012  . Physical exam, routine 12/07/2011  . Cerumen impaction 04/04/2011  . Trapezius muscle spasm 04/04/2011  . Hypertensive heart disease with CHF (congestive heart  failure) (HCC) 11/30/2010  . Hyperlipidemia 11/30/2010  . CVA (cerebral infarction) 11/30/2010  . Postmenopausal HRT (hormone replacement therapy) 11/30/2010  . Anxiety 11/30/2010  . Ankle weakness 11/30/2010    CMP     Component Value Date/Time   NA 138 03/01/2016   K 4.3 03/01/2016   CL 102 10/13/2015 1505   CO2 22 10/13/2015 1505   GLUCOSE 118 (H) 10/13/2015 1505   BUN 28 (A) 03/01/2016   CREATININE 0.6 03/01/2016   CREATININE 0.79 10/13/2015 1505   CALCIUM 9.1 10/13/2015 1505   PROT 6.3 10/13/2015 1505   ALBUMIN 3.8 10/13/2015 1505   ALBUMIN 3.8 10/13/2015 1505   AST 17 10/13/2015 1505   ALT 16 10/13/2015 1505   ALKPHOS 88 03/01/2016   BILITOT 0.6 10/13/2015 1505   GFRNONAA >60 11/05/2014 0225   GFRAA >60 11/05/2014 0225    Recent Labs  10/06/15 10/13/15 1505 03/01/16  NA 139 138 138  K 4.6 4.3 4.3  CL  --  102  --   CO2  --  22  --   GLUCOSE  --  118*  --   BUN 25* 29* 28*  CREATININE 0.5 0.79 0.6  CALCIUM  --  9.1  --     Recent Labs  09/03/15  10/13/15 1505 03/01/16  AST 15 17  --   ALT 14 16  --   ALKPHOS 73 81 88  BILITOT  --  0.6  --   PROT  --  6.3  --   ALBUMIN  --  3.8  3.8  --     Recent Labs  09/01/15 10/13/15 1505 02/29/16  WBC 7.0 10.3 7.6  HGB 12.0 12.6 13.0  HCT 37 37.5 39  MCV  --  88.2  --   PLT 185 183 182    Recent Labs  09/03/15 03/01/16  CHOL 150 160  LDLCALC 77 58  TRIG 145 318*   No results found for: MICROALBUR Lab Results  Component Value Date   TSH 2.31 02/29/2016   Lab Results  Component Value Date   HGBA1C 5.4 02/29/2016   Lab Results  Component Value Date   CHOL 160 03/01/2016   HDL 38 03/01/2016   LDLCALC 58 03/01/2016   LDLDIRECT 190.1 05/06/2014   TRIG 318 (A) 03/01/2016   CHOLHDL 7 05/06/2014    Significant Diagnostic Results in last 30 days:  No results found.  Assessment and Plan  Osteoporosis No fractures or falls;plan to cont Vit D 1000 u  Hyperlipidemia On lipitor 40 mg with LDL 58 and HDL 38, controlled; On zetia 10 mg for TG 318;plan to cont both meds  Anemia, iron deficiency Recent Hb 13 which is improved from prior; plan to cont FeSO4 325 mg daily     Anne D. [name], MD

## 2016-07-13 ENCOUNTER — Encounter: Payer: Self-pay | Admitting: Internal Medicine

## 2016-07-13 NOTE — Assessment & Plan Note (Signed)
On lipitor 40 mg with LDL 58 and HDL 38, controlled; On zetia 10 mg for TG 318;plan to cont both meds

## 2016-07-13 NOTE — Assessment & Plan Note (Signed)
Recent Hb 13 which is improved from prior; plan to cont FeSO4 325 mg daily

## 2016-07-13 NOTE — Assessment & Plan Note (Signed)
No fractures or falls;plan to cont Vit D 1000 u

## 2016-07-17 ENCOUNTER — Encounter: Payer: Self-pay | Admitting: Internal Medicine

## 2016-07-17 ENCOUNTER — Non-Acute Institutional Stay (SKILLED_NURSING_FACILITY): Payer: Medicare Other | Admitting: Internal Medicine

## 2016-07-17 DIAGNOSIS — I110 Hypertensive heart disease with heart failure: Secondary | ICD-10-CM

## 2016-07-17 DIAGNOSIS — F0280 Dementia in other diseases classified elsewhere without behavioral disturbance: Secondary | ICD-10-CM

## 2016-07-17 DIAGNOSIS — G20 Parkinson's disease: Secondary | ICD-10-CM | POA: Diagnosis not present

## 2016-07-17 DIAGNOSIS — J42 Unspecified chronic bronchitis: Secondary | ICD-10-CM | POA: Diagnosis not present

## 2016-07-17 NOTE — Progress Notes (Signed)
[facility] Room Number: 411P Place of Service:  SNF ([phone])  Randon Goldsmithnne D. Lyn HollingsheadAlexander, MD  Patient Care Team: Sheliah HatchKatherine E Tabori, MD as PCP - General [facility](Family Medicine)  Extended Emergency Contact Information Primary Emergency Contact: [name],[name] Address: [phone]          [place] [id number] [place] Home Phone: [phone] Relation: Daughter Secondary Emergency Contact: [name],[name]  [place] Home Phone: [phone] Relation: Daughter    Allergies: Sulfa antibiotics  Chief Complaint  Patient presents with  . Medical Management of Chronic Issues    Routine Visit    HPI: Patient is 83 y.o. female who is being seen for routine issues of HTN, dementia and COPD.  Past Medical History:  Diagnosis Date  . Aneurysm (HCC)    of the eye  . Decubitus ulcer of sacral region, stage 4 (HCC)   . Hyperlipidemia   . Hypertension   . Osteoporosis   . Stroke [facility](HCC) 10/2008    Past Surgical History:  Procedure Laterality Date  . EYE SURGERY    . PARTIAL HYSTERECTOMY  1968    Allergies as of 07/17/2016      Reactions   Sulfa Antibiotics    Cant remember reaction      Medication List       Accurate as of 07/17/16 11:59 PM. Always use your most recent med list.          amLODipine 10 MG tablet Commonly known as:  NORVASC TAKE 1 TABLET AT BEDTIME   aspirin 81 MG chewable tablet Chew 81 mg by mouth daily.   atenolol 50 MG tablet Commonly known as:  TENORMIN TAKE 1 TABLET TWICE A DAY   atorvastatin 40 MG tablet Commonly known as:  LIPITOR TAKE 1 TABLET DAILY   cholecalciferol 1000 units tablet Commonly known as:  VITAMIN D Take 1,000 Units by mouth daily.   docusate sodium 100 MG capsule Commonly known as:  COLACE Take 100 mg by mouth 2 (two) times daily.   escitalopram 10 MG tablet Commonly known as:  LEXAPRO Take 10 mg by mouth daily.   ferrous sulfate 325 (65 FE) MG  tablet Take 325 mg by mouth daily with breakfast.   fexofenadine 180 MG tablet Commonly known as:  ALLEGRA Take 180 mg by mouth daily.   LORazepam 0.5 MG tablet Commonly known as:  ATIVAN Take 1 tablet (0.5 mg total) by mouth every 8 (eight) hours.   memantine 10 MG tablet Commonly known as:  NAMENDA Take 10 mg by mouth 2 (two) times daily.   mirtazapine 15 MG tablet Commonly known as:  REMERON Take 15 mg by mouth at bedtime.   oxyCODONE 5 MG immediate release tablet Commonly known as:  Oxy IR/ROXICODONE Take 1 tablet (5 mg total) by mouth every 6 (six) hours as needed for severe pain.   pantoprazole 40 MG tablet Commonly known as:  PROTONIX Take 1 tablet (40 mg total) by mouth 2 (two) times daily.   polyethylene glycol packet Commonly known as:  MIRALAX / GLYCOLAX Take 17 g by mouth daily. To be hold for diarrhea   Potassium Chloride ER 20 MEQ Tbcr Take 1 tablet by mouth daily.   sennosides-docusate sodium 8.6-50 MG tablet Commonly known as:  SENOKOT-S Take 1 tablet by mouth 2 (two) times daily.   TAB-A-VITE Tabs Take 1 tablet by mouth daily.   traMADol 50 MG tablet Commonly known as:  Janean SarkULTRAM  Give 1 tablet by mouth every 8 hours scheduled for pain   vitamin C 500 MG tablet Commonly known as:  ASCORBIC ACID Take 500 mg by mouth 2 (two) times daily.   ZETIA 10 MG tablet Generic drug:  ezetimibe TAKE 1 TABLET DAILY       No orders of the defined types were placed in this encounter.   Immunization History  Administered Date(s) Administered  . Influenza,inj,Quad PF,36+ Mos 04/16/2013, 05/06/2014  . Influenza-Unspecified 02/03/2015, 02/08/2016  . PPD Test 10/14/2014    Social History  Substance Use Topics  . Smoking status: Former Smoker    Packs/day: 0.50    Years: 50.00  . Smokeless tobacco: Never Used  . Alcohol use No    Review of Systems  DATA OBTAINED: from patient, nurse GENERAL:  no fevers, fatigue, appetite changes SKIN: No itching,  rash HEENT: No complaint RESPIRATORY: No cough, wheezing, SOB CARDIAC: No chest pain, palpitations, lower extremity edema  GI: No abdominal pain, No N/V/D or constipation, No heartburn or reflux  GU: No dysuria, frequency or urgency, or incontinence  MUSCULOSKELETAL: No unrelieved bone/joint pain NEUROLOGIC: No headache, dizziness  PSYCHIATRIC: No overt anxiety or sadness  Vitals:   07/17/16 1027  BP: 139/69  Pulse: 77  Resp: 17  Temp: 97.6 F (36.4 C)   Body mass index is 23.86 kg/m. Physical Exam  GENERAL APPEARANCE: Alert, conversant, No acute distress  SKIN: No diaphoresis rash HEENT: Unremarkable RESPIRATORY: Breathing is even, unlabored. Lung sounds are clear   CARDIOVASCULAR: Heart RRR no murmurs, rubs or gallops. No peripheral edema  GASTROINTESTINAL: Abdomen is soft, non-tender, not distended w/ normal bowel sounds.  GENITOURINARY: Bladder non tender, not distended  MUSCULOSKELETAL: No abnormal joints or musculature NEUROLOGIC: Cranial nerves 2-12 grossly intact. Moves all extremities PSYCHIATRIC: Mood and affect with dementia, no behavioral issues  Patient Active Problem List   Diagnosis Date Noted  . Osteoporosis 03/18/2016  . Depression 12/23/2015  . Chronic diastolic heart failure (HCC) 09/05/2015  . Right leg pain 08/05/2015  . COPD (chronic obstructive pulmonary disease) (HCC) 06/25/2015  . Loss of weight 01/21/2015  . Anemia, iron deficiency 01/05/2015  . Acute upper respiratory infection 01/05/2015  . 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 Iron deficiency anemia 12/10/2014  . Stomach discomfort 11/30/2014  . Subacute confusional state 11/30/2014  . Acute respiratory failure with hypoxia (HCC) 11/11/2014  . AKI (acute kidney injury) (HCC) 11/11/2014  . Hypernatremia 11/11/2014  . Hypokalemia 11/09/2014  . COPD exacerbation (HCC)   . Acute on chronic diastolic heart failure (HCC)   . Hypoxia   . SOB  (shortness of breath)   . Pressure ulcer stage II 11/02/2014  . Palliative care encounter 11/02/2014  . HCAP (healthcare-associated pneumonia) 11/01/2014  . Leukocytosis 10/15/2014  . Closed fracture of part of upper end of humerus 10/14/2014  . Poor balance 11/25/2013  . History of stroke 11/25/2013  . 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 Myalgia 09/05/2012  . Disequilibrium 09/05/2012  . Hypertension, essential 09/05/2012  . 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 Mobility impaired 07/15/2012  . Physical exam, routine 12/07/2011  . Cerumen impaction 04/04/2011  . Trapezius muscle spasm 04/04/2011  . Hypertensive heart disease with CHF (congestive heart failure) (HCC) 11/30/2010  . Hyperlipidemia 11/30/2010  . CVA (cerebral infarction) 11/30/2010  . Postmenopausal HRT (hormone replacement therapy) 11/30/2010  . Anxiety 11/30/2010  . Ankle weakness 11/30/2010    CMP     Component Value Date/Time  NA 138 03/01/2016   K 4.3 03/01/2016   CL 102 10/13/2015 1505   CO2 22 10/13/2015 1505   GLUCOSE 118 (H) 10/13/2015 1505   BUN 28 (A) 03/01/2016   CREATININE 0.6 03/01/2016   CREATININE 0.79 10/13/2015 1505   CALCIUM 9.1 10/13/2015 1505   PROT 6.3 10/13/2015 1505   ALBUMIN 3.8 10/13/2015 1505   ALBUMIN 3.8 10/13/2015 1505   AST 17 10/13/2015 1505   ALT 16 10/13/2015 1505   ALKPHOS 88 03/01/2016   BILITOT 0.6 10/13/2015 1505   GFRNONAA >60 11/05/2014 0225   GFRAA >60 11/05/2014 0225    Recent Labs  10/06/15 10/13/15 1505 03/01/16  NA 139 138 138  K 4.6 4.3 4.3  CL  --  102  --   CO2  --  22  --   GLUCOSE  --  118*  --   BUN 25* 29* 28*  CREATININE 0.5 0.79 0.6  CALCIUM  --  9.1  --     Recent Labs  09/03/15 10/13/15 1505 03/01/16  AST 15 17  --   ALT 14 16  --   ALKPHOS 73 81 88  BILITOT  --  0.6  --   PROT  --  6.3  --   ALBUMIN  --  3.8  3.8  --     Recent Labs  09/01/15 10/13/15 1505 02/29/16  WBC 7.0 10.3 7.6  HGB 12.0 12.6 13.0  HCT  37 37.5 39  MCV  --  88.2  --   PLT 185 183 182    Recent Labs  09/03/15 03/01/16  CHOL 150 160  LDLCALC 77 58  TRIG 145 318*   No results found for: MICROALBUR Lab Results  Component Value Date   TSH 2.31 02/29/2016   Lab Results  Component Value Date   HGBA1C 5.4 02/29/2016   Lab Results  Component Value Date   CHOL 160 03/01/2016   HDL 38 03/01/2016   LDLCALC 58 03/01/2016   LDLDIRECT 190.1 05/06/2014   TRIG 318 (A) 03/01/2016   CHOLHDL 7 05/06/2014    Significant Diagnostic Results in last 30 days:  No results found.  Assessment and Plan  Hypertensive heart disease with CHF (congestive heart failure) (HCC) Controlled ; cont  Atenolol 50 mg BID and norvasc 10 mg daily  Dementia due to Parkinson's disease without behavioral disturbance (HCC) Chronic ansd stable; cont namenda 28 mg daily  COPD (chronic obstructive pulmonary disease) (HCC) No reported exacerbations; pt only on allegra 180 mg; will cont current med     [employer]. [name], MD

## 2016-07-18 ENCOUNTER — Encounter: Payer: Self-pay | Admitting: Internal Medicine

## 2016-07-18 NOTE — Progress Notes (Signed)
[facility] Room Number: 411P Place of Service:  SNF ([phone])  Beverly Black. Beverly Hollingshead, MD  Patient Care Team: Sheliah Hatch, MD as PCP - General [facility] Medicine)  Extended Emergency Contact Information Primary Emergency Contact: Beverly Black Address: [phone]          [place] [address] [place] Home Phone: [phone] Relation: Daughter Secondary Emergency Contact: Beverly [place] Home Phone: [phone] Relation: Daughter    Allergies: Sulfa antibiotics  Chief Complaint  Patient presents with  . Acute Visit    HPI: Patient is 83 y.o. female who is being seen acutely because an outbreak of Influenza A per CDC guidelines was recognized on 06/07/2016. Pt has no c/o flu like symptoms;therefore pt will need to be prophylaxed with Tamiflu for a minimum of 14 days per CDC protocol.  Past Medical History:  Diagnosis Date  . Aneurysm (HCC)    of the eye  . Decubitus ulcer of sacral region, stage 4 (HCC)   . Hyperlipidemia   . Hypertension   . Osteoporosis   . Stroke [facility]) 10/2008    Past Surgical History:  Procedure Laterality Date  . EYE SURGERY    . PARTIAL HYSTERECTOMY  1968    Allergies as of 06/07/2016      Reactions   Sulfa Antibiotics    Cant remember reaction      Medication List       Accurate as of 06/07/16 11:59 PM. Always use your most recent med list.          amLODipine 10 MG tablet Commonly known as:  NORVASC TAKE 1 TABLET AT BEDTIME   aspirin 81 MG chewable tablet Chew 81 mg by mouth daily.   atenolol 50 MG tablet Commonly known as:  TENORMIN TAKE 1 TABLET TWICE A DAY   atorvastatin 40 MG tablet Commonly known as:  LIPITOR TAKE 1 TABLET DAILY   cholecalciferol 1000 units tablet Commonly known as:  VITAMIN D Take 1,000 Units by mouth daily.   docusate sodium 100 MG capsule Commonly known as:  COLACE Take 100 mg by mouth 2 (two) times  daily.   escitalopram 10 MG tablet Commonly known as:  LEXAPRO Take 10 mg by mouth daily.   ferrous sulfate 325 (65 FE) MG tablet Take 325 mg by mouth daily with breakfast.   fexofenadine 180 MG tablet Commonly known as:  ALLEGRA Take 180 mg by mouth daily.   LORazepam 0.5 MG tablet Commonly known as:  ATIVAN Take 1 tablet (0.5 mg total) by mouth every 8 (eight) hours.   memantine 10 MG tablet Commonly known as:  NAMENDA Take 10 mg by mouth 2 (two) times daily.   mirtazapine 15 MG tablet Commonly known as:  REMERON Take 15 mg by mouth at bedtime.   oxyCODONE 5 MG immediate release tablet Commonly known as:  Oxy IR/ROXICODONE Take 1 tablet (5 mg total) by mouth every 6 (six) hours as needed for severe pain.   pantoprazole 40 MG tablet Commonly known as:  PROTONIX Take 1 tablet (40 mg total) by mouth 2 (two) times daily.   polyethylene glycol packet Commonly known as:  MIRALAX / GLYCOLAX Take 17 g by mouth daily. To be hold for diarrhea   Potassium Chloride ER 20 MEQ Tbcr Take 1 tablet by mouth daily.   sennosides-docusate sodium 8.6-50 MG tablet Commonly known as:  SENOKOT-S Take 1 tablet by mouth 2 (two) times  daily.   TAB-A-VITE Tabs Take 1 tablet by mouth daily.   traMADol 50 MG tablet Commonly known as:  ULTRAM Give 1 tablet by mouth every 8 hours scheduled for pain   vitamin C 500 MG tablet Commonly known as:  ASCORBIC ACID Take 500 mg by mouth 2 (two) times daily.   ZETIA 10 MG tablet Generic drug:  ezetimibe TAKE 1 TABLET DAILY       No orders of the defined types were placed in this encounter.   Immunization History  Administered Date(s) Administered  . Influenza,inj,Quad PF,36+ Mos 04/16/2013, 05/06/2014  . Influenza-Unspecified 02/03/2015, 02/08/2016  . PPD Test 10/14/2014    Social History  Substance Use Topics  . Smoking status: Former Smoker    Packs/day: 0.50    Years: 50.00  . Smokeless tobacco: Never Used  . Alcohol use No      Review of Systems  DATA OBTAINED: from patient, nurse GENERAL:  no fevers SKIN: No itching, rash HEENT: no rhinorrhea, congestion, ST or ear pain RESPIRATORY: No cough, wheezing, SOB CARDIAC: No chest pain, palpitations, lower extremity edema  GI: No abdominal pain, No N/V/D or constipation, No heartburn or reflux  MUSCULOSKELETAL: No muscle aches NEUROLOGIC: No headache, dizziness   Vitals:   06/07/16 1151  BP: 139/69  Pulse: 77  Resp: 17  Temp: 97.6 F (36.4 C)   Body mass index is 23.86 kg/m. Physical Exam  GENERAL APPEARANCE: Alert, conversant, No acute distress  SKIN: No diaphoresis rash HEENT: Unremarkable RESPIRATORY: Breathing is even, unlabored. Lung sounds are clear   CARDIOVASCULAR: Heart RRR no murmurs, rubs or gallops. No peripheral edema  GASTROINTESTINAL: Abdomen is soft, non-tender, not distended w/ normal bowel sounds.   NEUROLOGIC: Cranial nerves 2-12 grossly intact PSYCHIATRIC: baseline, no mental status changes  Patient Active Problem List   Diagnosis Date Noted  . Osteoporosis 03/18/2016  . Depression 12/23/2015  . Chronic diastolic heart failure (HCC) 09/05/2015  . Right leg pain 08/05/2015  . COPD (chronic obstructive pulmonary disease) (HCC) 06/25/2015  . Loss of weight 01/21/2015  . Anemia, iron deficiency 01/05/2015  . Acute upper respiratory infection 01/05/2015  . 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 Iron deficiency anemia 12/10/2014  . Stomach discomfort 11/30/2014  . Subacute confusional state 11/30/2014  . Acute respiratory failure with hypoxia (HCC) 11/11/2014  . AKI (acute kidney injury) (HCC) 11/11/2014  . Hypernatremia 11/11/2014  . Hypokalemia 11/09/2014  . COPD exacerbation (HCC)   . Acute on chronic diastolic heart failure (HCC)   . Hypoxia   . SOB (shortness of breath)   . Pressure ulcer stage II 11/02/2014  . Palliative care encounter 11/02/2014  . HCAP  (healthcare-associated pneumonia) 11/01/2014  . Leukocytosis 10/15/2014  . Closed fracture of part of upper end of humerus 10/14/2014  . Poor balance 11/25/2013  . History of stroke 11/25/2013  . 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 Myalgia 09/05/2012  . Disequilibrium 09/05/2012  . Hypertension, essential 09/05/2012  . 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 Mobility impaired 07/15/2012  . Physical exam, routine 12/07/2011  . Cerumen impaction 04/04/2011  . Trapezius muscle spasm 04/04/2011  . Hypertensive heart disease with CHF (congestive heart failure) (HCC) 11/30/2010  . Hyperlipidemia 11/30/2010  . CVA (cerebral infarction) 11/30/2010  . Postmenopausal HRT (hormone replacement therapy) 11/30/2010  . Anxiety 11/30/2010  . Ankle weakness 11/30/2010    CMP     Component Value Date/Time   NA 138 03/01/2016   K 4.3  03/01/2016   CL 102 10/13/2015 1505   CO2 22 10/13/2015 1505   GLUCOSE 118 (H) 10/13/2015 1505   BUN 28 (A) 03/01/2016   CREATININE 0.6 03/01/2016   CREATININE 0.79 10/13/2015 1505   CALCIUM 9.1 10/13/2015 1505   PROT 6.3 10/13/2015 1505   ALBUMIN 3.8 10/13/2015 1505   ALBUMIN 3.8 10/13/2015 1505   AST 17 10/13/2015 1505   ALT 16 10/13/2015 1505   ALKPHOS 88 03/01/2016   BILITOT 0.6 10/13/2015 1505   GFRNONAA >60 11/05/2014 0225   GFRAA >60 11/05/2014 0225    Recent Labs  10/06/15 10/13/15 1505 03/01/16  NA 139 138 138  K 4.6 4.3 4.3  CL  --  102  --   CO2  --  22  --   GLUCOSE  --  118*  --   BUN 25* 29* 28*  CREATININE 0.5 0.79 0.6  CALCIUM  --  9.1  --     Recent Labs  09/03/15 10/13/15 1505 03/01/16  AST 15 17  --   ALT 14 16  --   ALKPHOS 73 81 88  BILITOT  --  0.6  --   PROT  --  6.3  --   ALBUMIN  --  3.8  3.8  --     Recent Labs  09/01/15 10/13/15 1505 02/29/16  WBC 7.0 10.3 7.6  HGB 12.0 12.6 13.0  HCT 37 37.5 39  MCV  --  88.2  --   PLT 185 183 182    Recent Labs  09/03/15 03/01/16  CHOL 150 160  LDLCALC  77 58  TRIG 145 318*   No results found for: MICROALBUR Lab Results  Component Value Date   TSH 2.31 02/29/2016   Lab Results  Component Value Date   HGBA1C 5.4 02/29/2016   Lab Results  Component Value Date   CHOL 160 03/01/2016   HDL 38 03/01/2016   LDLCALC 58 03/01/2016   LDLDIRECT 190.1 05/06/2014   TRIG 318 (A) 03/01/2016   CHOLHDL 7 05/06/2014    Significant Diagnostic Results in last 30 days:  No results found.  Assessment and Plan  EXPOSURE TO FLU/ INFLUENZA OUTBREAK AT SNF-   CrCl calculated by me-  66      Dose for 14 days-  75 mg daily Pt will be monitored daily for flu like symptoms                                                                                 Thurston Hole D. Beverly Hollingshead, MD

## 2016-07-22 ENCOUNTER — Encounter: Payer: Self-pay | Admitting: Internal Medicine

## 2016-07-22 NOTE — Assessment & Plan Note (Signed)
No reported exacerbations; pt only on allegra 180 mg; will cont current med

## 2016-07-22 NOTE — Assessment & Plan Note (Signed)
Controlled ; cont  Atenolol 50 mg BID and norvasc 10 mg daily

## 2016-07-22 NOTE — Assessment & Plan Note (Signed)
Chronic ansd stable; cont namenda 28 mg daily

## 2016-08-07 ENCOUNTER — Non-Acute Institutional Stay (SKILLED_NURSING_FACILITY): Payer: Medicare Other | Admitting: Internal Medicine

## 2016-08-07 ENCOUNTER — Encounter: Payer: Self-pay | Admitting: Internal Medicine

## 2016-08-07 DIAGNOSIS — L03116 Cellulitis of left lower limb: Secondary | ICD-10-CM | POA: Diagnosis not present

## 2016-08-07 NOTE — Progress Notes (Signed)
Location:  [facility] Room Number: 411P Place of Service:  SNF [phone])  Merrilee Seashore, MD  Patient Care Team: [name], MD as PCP - General (Internal Medicine)  Extended Emergency Contact Information Primary Emergency Contact: [name],[name] Address: [address]          [name] [address] [name] Home Phone: [phone] Relation: Daughter Secondary Emergency Contact: [name] [place] Home Phone: [phone] Relation: Daughter    Allergies: Sulfa antibiotics  Chief Complaint  Patient presents with  . Acute Visit    Acute    HPI: Patient is 83 y.o. female who the wound care nurse asked me to see. Pt an abrasion on LLE with slough and surrounding redness, noted today. Pt is not sure, an unreported fall or scrape to leg is possible. Maybe going on for several days. No fever, malaise , change in appetite or other systemic symptom.  Past Medical History:  Diagnosis Date  . Aneurysm (HCC)    of the eye  . Decubitus ulcer of sacral region, stage 4 (HCC)   . Hyperlipidemia   . Hypertension   . Osteoporosis   . Stroke [facility]) 10/2008    Past Surgical History:  Procedure Laterality Date  . EYE SURGERY    . PARTIAL HYSTERECTOMY  1968    Allergies as of 08/07/2016      Reactions   Sulfa Antibiotics    Cant remember reaction      Medication List       Accurate as of 08/07/16  2:57 PM. Always use your most recent med list.          amLODipine 10 MG tablet Commonly known as:  NORVASC TAKE 1 TABLET AT BEDTIME   aspirin 81 MG chewable tablet Chew 81 mg by mouth daily.   atenolol 50 MG tablet Commonly known as:  TENORMIN TAKE 1 TABLET TWICE A DAY   atorvastatin 40 MG tablet Commonly known as:  LIPITOR TAKE 1 TABLET DAILY   cholecalciferol 1000 units tablet Commonly known as:  VITAMIN D Take 1,000 Units by mouth daily.   docusate sodium 100 MG capsule Commonly known as:   COLACE Take 100 mg by mouth 2 (two) times daily.   escitalopram 10 MG tablet Commonly known as:  LEXAPRO Take 10 mg by mouth daily.   ferrous sulfate 325 (65 FE) MG tablet Take 325 mg by mouth daily with breakfast.   fexofenadine 180 MG tablet Commonly known as:  ALLEGRA Take 180 mg by mouth daily.   LORazepam 0.5 MG tablet Commonly known as:  ATIVAN Take 1 tablet (0.5 mg total) by mouth every 8 (eight) hours.   memantine 10 MG tablet Commonly known as:  NAMENDA Take 10 mg by mouth 2 (two) times daily.   mirtazapine 15 MG tablet Commonly known as:  REMERON Take 15 mg by mouth at bedtime.   oxyCODONE 5 MG immediate release tablet Commonly known as:  Oxy IR/ROXICODONE Take 1 tablet (5 mg total) by mouth every 6 (six) hours as needed for severe pain.   pantoprazole 40 MG tablet Commonly known as:  PROTONIX Take 1 tablet (40 mg total) by mouth 2 (two) times daily.   polyethylene glycol packet Commonly known as:  MIRALAX / GLYCOLAX Take 17 g by mouth daily. To be hold for diarrhea   Potassium Chloride ER 20 MEQ Tbcr Take 1 tablet by mouth daily.   sennosides-docusate sodium 8.6-50 MG tablet  Commonly known as:  SENOKOT-S Take 1 tablet by mouth 2 (two) times daily.   TAB-A-VITE Tabs Take 1 tablet by mouth daily.   traMADol 50 MG tablet Commonly known as:  ULTRAM Give 1 tablet by mouth every 8 hours scheduled for pain   vitamin C 500 MG tablet Commonly known as:  ASCORBIC ACID Take 500 mg by mouth 2 (two) times daily.   ZETIA 10 MG tablet Generic drug:  ezetimibe TAKE 1 TABLET DAILY       No orders of the defined types were placed in this encounter.   Immunization History  Administered Date(s) Administered  . Influenza,inj,Quad PF,36+ Mos 04/16/2013, 05/06/2014  . Influenza-Unspecified 02/03/2015, 02/08/2016  . PPD Test 10/14/2014    Social History  Substance Use Topics  . Smoking status: Former Smoker    Packs/day: 0.50    Years: 50.00  .  Smokeless tobacco: Never Used  . Alcohol use No    Review of Systems  DATA OBTAINED: from patient, nurse GENERAL:  no fevers, fatigue, appetite changes SKIN: area is TTP HEENT: No complaint RESPIRATORY: No cough, wheezing, SOB CARDIAC: No chest pain, palpitations, lower extremity edema  GI: No abdominal pain, No N/V/D or constipation, No heartburn or reflux  GU: No dysuria, frequency or urgency, or incontinence  MUSCULOSKELETAL: No unrelieved bone/joint pain NEUROLOGIC: No headache, dizziness  PSYCHIATRIC: No overt anxiety or sadness  Vitals:   08/07/16 1447  BP: 139/69  Pulse: 61  Resp: 18  Temp: 97.6 F (36.4 C)   Body mass index is 23.93 kg/m. Physical Exam  GENERAL APPEARANCE: Alert, conversant, No acute distress  SKIN: ant\ LLE - approx 2cm superficial abrasion with mild yellow slough, no odor; surrounding area with erythema and heat and TTP HEENT: Unremarkable RESPIRATORY: Breathing is even, unlabored. Lung sounds are clear   CARDIOVASCULAR: Heart RRR no murmurs, rubs or gallops. No peripheral edema  GASTROINTESTINAL: Abdomen is soft, non-tender, not distended w/ normal bowel sounds.  GENITOURINARY: Bladder non tender, not distended  MUSCULOSKELETAL: No abnormal joints or musculature NEUROLOGIC: Cranial nerves 2-12 grossly intact. Moves all extremities PSYCHIATRIC: Mood and affect with dementia, no behavioral issues  Patient Active Problem List   Diagnosis Date Noted  . Osteoporosis 03/18/2016  . Depression 12/23/2015  . Chronic diastolic heart failure (HCC) 09/05/2015  . Right leg pain 08/05/2015  . COPD (chronic obstructive pulmonary disease) (HCC) 06/25/2015  . Loss of weight 01/21/2015  . Anemia, iron deficiency 01/05/2015  . Acute upper respiratory infection 01/05/2015  . 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 Iron deficiency anemia 12/10/2014  . Stomach discomfort 11/30/2014  . Subacute confusional state  11/30/2014  . Acute respiratory failure with hypoxia (HCC) 11/11/2014  . AKI (acute kidney injury) (HCC) 11/11/2014  . Hypernatremia 11/11/2014  . Hypokalemia 11/09/2014  . COPD exacerbation (HCC)   . Acute on chronic diastolic heart failure (HCC)   . Hypoxia   . SOB (shortness of breath)   . Pressure ulcer stage II 11/02/2014  . Palliative care encounter 11/02/2014  . HCAP (healthcare-associated pneumonia) 11/01/2014  . Leukocytosis 10/15/2014  . Closed fracture of part of upper end of humerus 10/14/2014  . Poor balance 11/25/2013  . History of stroke 11/25/2013  . 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 Myalgia 09/05/2012  . Disequilibrium 09/05/2012  . Hypertension, essential 09/05/2012  . 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 Mobility impaired 07/15/2012  . Physical exam, routine 12/07/2011  . Cerumen impaction 04/04/2011  . Trapezius  muscle spasm 04/04/2011  . Hypertensive heart disease with CHF (congestive heart failure) (HCC) 11/30/2010  . Hyperlipidemia 11/30/2010  . CVA (cerebral infarction) 11/30/2010  . Postmenopausal HRT (hormone replacement therapy) 11/30/2010  . Anxiety 11/30/2010  . Ankle weakness 11/30/2010    CMP     Component Value Date/Time   NA 138 03/01/2016   K 4.3 03/01/2016   CL 102 10/13/2015 1505   CO2 22 10/13/2015 1505   GLUCOSE 118 (H) 10/13/2015 1505   BUN 28 (A) 03/01/2016   CREATININE 0.6 03/01/2016   CREATININE 0.79 10/13/2015 1505   CALCIUM 9.1 10/13/2015 1505   PROT 6.3 10/13/2015 1505   ALBUMIN 3.8 10/13/2015 1505   ALBUMIN 3.8 10/13/2015 1505   AST 17 10/13/2015 1505   ALT 16 10/13/2015 1505   ALKPHOS 88 03/01/2016   BILITOT 0.6 10/13/2015 1505   GFRNONAA >60 11/05/2014 0225   GFRAA >60 11/05/2014 0225    Recent Labs  10/06/15 10/13/15 1505 03/01/16  NA 139 138 138  K 4.6 4.3 4.3  CL  --  102  --   CO2  --  22  --   GLUCOSE  --  118*  --   BUN 25* 29* 28*  CREATININE 0.5 0.79 0.6  CALCIUM  --  9.1  --      Recent Labs  09/03/15 10/13/15 1505 03/01/16  AST 15 17  --   ALT 14 16  --   ALKPHOS 73 81 88  BILITOT  --  0.6  --   PROT  --  6.3  --   ALBUMIN  --  3.8  3.8  --     Recent Labs  09/01/15 10/13/15 1505 02/29/16  WBC 7.0 10.3 7.6  HGB 12.0 12.6 13.0  HCT 37 37.5 39  MCV  --  88.2  --   PLT 185 183 182    Recent Labs  09/03/15 03/01/16  CHOL 150 160  LDLCALC 77 58  TRIG 145 318*   No results found for: MICROALBUR Lab Results  Component Value Date   TSH 2.31 02/29/2016   Lab Results  Component Value Date   HGBA1C 5.4 02/29/2016   Lab Results  Component Value Date   CHOL 160 03/01/2016   HDL 38 03/01/2016   LDLCALC 58 03/01/2016   LDLDIRECT 190.1 05/06/2014   TRIG 318 (A) 03/01/2016   CHOLHDL 7 05/06/2014    Significant Diagnostic Results in last 30 days:  No results found.  Assessment and Plan  CELLULITIS LLE - KASIE  Will cont wound care; have written for doxycycline 100 mg BID for 7 days; will monitor   Anabell Swint D. [name], MD

## 2016-08-22 ENCOUNTER — Non-Acute Institutional Stay (SKILLED_NURSING_FACILITY): Payer: Medicare Other | Admitting: Internal Medicine

## 2016-08-22 ENCOUNTER — Encounter: Payer: Self-pay | Admitting: Internal Medicine

## 2016-08-22 DIAGNOSIS — D508 Other iron deficiency anemias: Secondary | ICD-10-CM | POA: Diagnosis not present

## 2016-08-22 DIAGNOSIS — F419 Anxiety disorder, unspecified: Secondary | ICD-10-CM

## 2016-08-22 DIAGNOSIS — F329 Major depressive disorder, single episode, unspecified: Secondary | ICD-10-CM | POA: Diagnosis not present

## 2016-08-22 DIAGNOSIS — F32A Depression, unspecified: Secondary | ICD-10-CM

## 2016-08-22 NOTE — Progress Notes (Signed)
Location:  [facility] Room Number: 411P Place of Service:  SNF (31)  [name], MD  Patient Care Team: [name], MD as PCP - General (Internal Medicine)  Extended Emergency Contact Information Primary Emergency Contact: [name],[name] Address: [address]          [name] [address] [name] Home Phone: [phone] Relation: Daughter Secondary Emergency Contact: [name] [place] Home Phone: [phone] Relation: Daughter    Allergies: Sulfa antibiotics  Chief Complaint  Patient presents with  . Medical Management of Chronic Issues    Routine Visit    HPI: Patient is 83 y.o. female who is being seen for routine isues of anxiety, depression and iron def anemia.   Past Medical History:  Diagnosis Date  . Aneurysm (HCC)    of the eye  . Decubitus ulcer of sacral region, stage 4 (HCC)   . Hyperlipidemia   . Hypertension   . Osteoporosis   . Stroke [facility]) 10/2008    Past Surgical History:  Procedure Laterality Date  . EYE SURGERY    . PARTIAL HYSTERECTOMY  1968    Allergies as of 08/22/2016      Reactions   Sulfa Antibiotics    Cant remember reaction      Medication List       Accurate as of 08/22/16 11:59 PM. Always use your most recent med list.          amLODipine 10 MG tablet Commonly known as:  NORVASC TAKE 1 TABLET AT BEDTIME   aspirin 81 MG chewable tablet Chew 81 mg by mouth daily.   atenolol 50 MG tablet Commonly known as:  TENORMIN TAKE 1 TABLET TWICE A DAY   atorvastatin 40 MG tablet Commonly known as:  LIPITOR TAKE 1 TABLET DAILY   cholecalciferol 1000 units tablet Commonly known as:  VITAMIN D Take 1,000 Units by mouth daily.   docusate sodium 100 MG capsule Commonly known as:  COLACE Take 100 mg by mouth 2 (two) times daily.   escitalopram 10 MG tablet Commonly known as:  LEXAPRO Take 10 mg by mouth daily.   ferrous sulfate  325 (65 FE) MG tablet Take 325 mg by mouth daily with breakfast.   fexofenadine 180 MG tablet Commonly known as:  ALLEGRA Take 180 mg by mouth daily.   LORazepam 0.5 MG tablet Commonly known as:  ATIVAN Take 1 tablet (0.5 mg total) by mouth every 8 (eight) hours.   memantine 10 MG tablet Commonly known as:  NAMENDA Take 10 mg by mouth 2 (two) times daily.   mirtazapine 15 MG tablet Commonly known as:  REMERON Take 15 mg by mouth at bedtime.   oxyCODONE 5 MG immediate release tablet Commonly known as:  Oxy IR/ROXICODONE Take 1 tablet (5 mg total) by mouth every 6 (six) hours as needed for severe pain.   pantoprazole 40 MG tablet Commonly known as:  PROTONIX Take 1 tablet (40 mg total) by mouth 2 (two) times daily.   polyethylene glycol packet Commonly known as:  MIRALAX / GLYCOLAX Take 17 g by mouth daily. To be hold for diarrhea   Potassium Chloride ER 20 MEQ Tbcr Take 1 tablet by mouth daily.   sennosides-docusate sodium 8.6-50 MG tablet Commonly known as:  SENOKOT-S Take 1 tablet by mouth 2 (two) times daily.   TAB-A-VITE Tabs Take 1 tablet by mouth daily.   traMADol 50 MG tablet Commonly known  as:  ULTRAM Give 1 tablet by mouth every 8 hours scheduled for pain   vitamin C 500 MG tablet Commonly known as:  ASCORBIC ACID Take 500 mg by mouth 2 (two) times daily.   ZETIA 10 MG tablet Generic drug:  ezetimibe TAKE 1 TABLET DAILY       No orders of the defined types were placed in this encounter.   Immunization History  Administered Date(s) Administered  . Influenza,inj,Quad PF,36+ Mos 04/16/2013, 05/06/2014  . Influenza-Unspecified 02/03/2015, 02/08/2016  . PPD Test 10/14/2014    Social History  Substance Use Topics  . Smoking status: Former Smoker    Packs/day: 0.50    Years: 50.00  . Smokeless tobacco: Never Used  . Alcohol use No    Review of Systems  DATA OBTAINED: from patient, nurse GENERAL:  no fevers, fatigue, appetite  changes SKIN: No itching, rash HEENT: No complaint RESPIRATORY: No cough, wheezing, SOB CARDIAC: No chest pain, palpitations, lower extremity edema  GI: No abdominal pain, No N/V/D or constipation, No heartburn or reflux  GU: No dysuria, frequency or urgency, or incontinence  MUSCULOSKELETAL: No unrelieved bone/joint pain NEUROLOGIC: No headache, dizziness  PSYCHIATRIC: No overt anxiety or sadness  Vitals:   08/22/16 1023  BP: 126/62  Pulse: 72  Resp: 20  Temp: 98 F (36.7 C)   Body mass index is 24.03 kg/m. Physical Exam  GENERAL APPEARANCE: Alert, conversant, No acute distress  SKIN: No diaphoresis rash HEENT: Unremarkable RESPIRATORY: Breathing is even, unlabored. Lung sounds are clear   CARDIOVASCULAR: Heart RRR no murmurs, rubs or gallops. No peripheral edema  GASTROINTESTINAL: Abdomen is soft, non-tender, not distended w/ normal bowel sounds.  GENITOURINARY: Bladder non tender, not distended  MUSCULOSKELETAL: No abnormal joints or musculature NEUROLOGIC: Cranial nerves 2-12 grossly intact. Moves all extremities PSYCHIATRIC: Mood and affect appropriate with dementia, no behavioral issues  Patient Active Problem List   Diagnosis Date Noted  . Osteoporosis 03/18/2016  . Depression 12/23/2015  . Chronic diastolic heart failure (HCC) 09/05/2015  . Right leg pain 08/05/2015  . COPD (chronic obstructive pulmonary disease) (HCC) 06/25/2015  . Loss of weight 01/21/2015  . Anemia, iron deficiency 01/05/2015  . Acute upper respiratory infection 01/05/2015  . 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 Iron deficiency anemia 12/10/2014  . Stomach discomfort 11/30/2014  . Subacute confusional state 11/30/2014  . Acute respiratory failure with hypoxia (HCC) 11/11/2014  . AKI (acute kidney injury) (HCC) 11/11/2014  . Hypernatremia 11/11/2014  . Hypokalemia 11/09/2014  . COPD exacerbation (HCC)   . Acute on chronic diastolic heart  failure (HCC)   . Hypoxia   . SOB (shortness of breath)   . Pressure ulcer stage II 11/02/2014  . Palliative care encounter 11/02/2014  . HCAP (healthcare-associated pneumonia) 11/01/2014  . Leukocytosis 10/15/2014  . Closed fracture of part of upper end of humerus 10/14/2014  . Poor balance 11/25/2013  . History of stroke 11/25/2013  . 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 Myalgia 09/05/2012  . Disequilibrium 09/05/2012  . Hypertension, essential 09/05/2012  . 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 Mobility impaired 07/15/2012  . Physical exam, routine 12/07/2011  . Cerumen impaction 04/04/2011  . Trapezius muscle spasm 04/04/2011  . Hypertensive heart disease with CHF (congestive heart failure) (HCC) 11/30/2010  . Hyperlipidemia 11/30/2010  . CVA (cerebral infarction) 11/30/2010  . Postmenopausal HRT (hormone replacement therapy) 11/30/2010  . Anxiety 11/30/2010  . Ankle weakness 11/30/2010    CMP  Component Value Date/Time   NA 138 03/01/2016   K 4.3 03/01/2016   CL 102 10/13/2015 1505   CO2 22 10/13/2015 1505   GLUCOSE 118 (H) 10/13/2015 1505   BUN 28 (A) 03/01/2016   CREATININE 0.6 03/01/2016   CREATININE 0.79 10/13/2015 1505   CALCIUM 9.1 10/13/2015 1505   PROT 6.3 10/13/2015 1505   ALBUMIN 3.8 10/13/2015 1505   ALBUMIN 3.8 10/13/2015 1505   AST 17 10/13/2015 1505   ALT 16 10/13/2015 1505   ALKPHOS 88 03/01/2016   BILITOT 0.6 10/13/2015 1505   GFRNONAA >60 11/05/2014 0225   GFRAA >60 11/05/2014 0225    Recent Labs  10/06/15 10/13/15 1505 03/01/16  NA 139 138 138  K 4.6 4.3 4.3  CL  --  102  --   CO2  --  22  --   GLUCOSE  --  118*  --   BUN 25* 29* 28*  CREATININE 0.5 0.79 0.6  CALCIUM  --  9.1  --     Recent Labs  10/13/15 1505 03/01/16  AST 17  --   ALT 16  --   ALKPHOS 81 88  BILITOT 0.6  --   PROT 6.3  --   ALBUMIN 3.8  3.8  --     Recent Labs  10/13/15 1505 02/29/16  WBC 10.3 7.6  HGB 12.6 13.0  HCT 37.5 39  MCV  88.2  --   PLT 183 182    Recent Labs  03/01/16  CHOL 160  LDLCALC 58  TRIG 318*   No results found for: MICROALBUR Lab Results  Component Value Date   TSH 2.31 02/29/2016   Lab Results  Component Value Date   HGBA1C 5.4 02/29/2016   Lab Results  Component Value Date   CHOL 160 03/01/2016   HDL 38 03/01/2016   LDLCALC 58 03/01/2016   LDLDIRECT 190.1 05/06/2014   TRIG 318 (A) 03/01/2016   CHOLHDL 7 05/06/2014    Significant Diagnostic Results in last 30 days:  No results found.  Assessment and Plan  Anxiety Chronic and stable;cont ativan 0.5 mg q8  Depression Chronic and stable;plan to cont remeron 25 mg qHs and lexapro 10 mg daily  Iron deficiency anemia Most recent Hb 13;plan to cont iron 325 mg daily    Marketa Midkiff D. [name], MD

## 2016-09-02 ENCOUNTER — Encounter: Payer: Self-pay | Admitting: Internal Medicine

## 2016-09-18 ENCOUNTER — Non-Acute Institutional Stay (SKILLED_NURSING_FACILITY): Payer: Medicare Other | Admitting: Internal Medicine

## 2016-09-18 ENCOUNTER — Encounter: Payer: Self-pay | Admitting: Internal Medicine

## 2016-09-18 DIAGNOSIS — I110 Hypertensive heart disease with heart failure: Secondary | ICD-10-CM | POA: Diagnosis not present

## 2016-09-18 DIAGNOSIS — I5032 Chronic diastolic (congestive) heart failure: Secondary | ICD-10-CM

## 2016-09-18 DIAGNOSIS — J42 Unspecified chronic bronchitis: Secondary | ICD-10-CM | POA: Diagnosis not present

## 2016-09-18 NOTE — Progress Notes (Signed)
Location:  [facility] Room Number: 411P Place of Service:  SNF (31)  [name], MD  Patient Care Team: [name], MD as PCP - General (Internal Medicine)  Extended Emergency Contact Information Primary Emergency Contact: [name],[name] Address: [address]          [name] [address] [name] Home Phone: [phone] Relation: Daughter Secondary Emergency Contact: [name] [place] Home Phone: [phone] Relation: Daughter    Allergies: Sulfa antibiotics  Chief Complaint  Patient presents with  . Medical Management of Chronic Issues    Routine Visit    HPI: Patient is 83 y.o. female who is being seen for routine issues of CHF, HTN and COPd.  Past Medical History:  Diagnosis Date  . Aneurysm (HCC)    of the eye  . Decubitus ulcer of sacral region, stage 4 (HCC)   . Hyperlipidemia   . Hypertension   . Osteoporosis   . Stroke [facility] 10/2008    Past Surgical History:  Procedure Laterality Date  . EYE SURGERY    . PARTIAL HYSTERECTOMY  1968    Allergies as of 09/18/2016      Reactions   Sulfa Antibiotics    Cant remember reaction      Medication List       Accurate as of 09/18/16 11:59 PM. Always use your most recent med list.          amLODipine 10 MG tablet Commonly known as:  NORVASC TAKE 1 TABLET AT BEDTIME   aspirin 81 MG chewable tablet Chew 81 mg by mouth daily.   atenolol 50 MG tablet Commonly known as:  TENORMIN TAKE 1 TABLET TWICE A DAY   atorvastatin 40 MG tablet Commonly known as:  LIPITOR TAKE 1 TABLET DAILY   cholecalciferol 1000 units tablet Commonly known as:  VITAMIN D Take 1,000 Units by mouth daily.   docusate sodium 100 MG capsule Commonly known as:  COLACE Take 100 mg by mouth 2 (two) times daily.   escitalopram 10 MG tablet Commonly known as:  LEXAPRO Take 10 mg by mouth daily.   ferrous sulfate 325 (65 FE) MG  tablet Take 325 mg by mouth daily with breakfast.   fexofenadine 180 MG tablet Commonly known as:  ALLEGRA Take 180 mg by mouth daily.   LORazepam 0.5 MG tablet Commonly known as:  ATIVAN Take 1 tablet (0.5 mg total) by mouth every 8 (eight) hours.   memantine 10 MG tablet Commonly known as:  NAMENDA Take 10 mg by mouth 2 (two) times daily.   mirtazapine 15 MG tablet Commonly known as:  REMERON Take 15 mg by mouth at bedtime.   oxyCODONE 5 MG immediate release tablet Commonly known as:  Oxy IR/ROXICODONE Take 1 tablet (5 mg total) by mouth every 6 (six) hours as needed for severe pain.   pantoprazole 40 MG tablet Commonly known as:  PROTONIX Take 1 tablet (40 mg total) by mouth 2 (two) times daily.   polyethylene glycol packet Commonly known as:  MIRALAX / GLYCOLAX Take 17 g by mouth daily. To be hold for diarrhea   Potassium Chloride ER 20 MEQ Tbcr Take 1 tablet by mouth daily.   sennosides-docusate sodium 8.6-50 MG tablet Commonly known as:  SENOKOT-S Take 1 tablet by mouth 2 (two) times daily.   TAB-A-VITE Tabs Take 1 tablet by mouth daily.   traMADol 50 MG tablet Commonly known as:  Janean Sark  Give 1 tablet by mouth every 8 hours scheduled for pain   vitamin C 500 MG tablet Commonly known as:  ASCORBIC ACID Take 500 mg by mouth 2 (two) times daily.   ZETIA 10 MG tablet Generic drug:  ezetimibe TAKE 1 TABLET DAILY       No orders of the defined types were placed in this encounter.   Immunization History  Administered Date(s) Administered  . Influenza,inj,Quad PF,36+ Mos 04/16/2013, 05/06/2014  . Influenza-Unspecified 02/03/2015, 02/08/2016  . PPD Test 10/14/2014    Social History  Substance Use Topics  . Smoking status: Former Smoker    Packs/day: 0.50    Years: 50.00  . Smokeless tobacco: Never Used  . Alcohol use No    Review of Systems  DATA OBTAINED: from patient, nurse GENERAL:  no fevers, fatigue, appetite changes SKIN: No itching,  rash HEENT: No complaint RESPIRATORY: No cough, wheezing, SOB CARDIAC: No chest pain, palpitations, lower extremity edema  GI: No abdominal pain, No N/V/D or constipation, No heartburn or reflux  GU: No dysuria, frequency or urgency, or incontinence  MUSCULOSKELETAL: No unrelieved bone/joint pain NEUROLOGIC: No headache, dizziness  PSYCHIATRIC: No overt anxiety or sadness  Vitals:   09/18/16 1441  BP: 135/72  Pulse: 65  Resp: 18  Temp: 97.5 F (36.4 C)   Body mass index is 24.27 kg/m. Physical Exam  GENERAL APPEARANCE: Alert, conversant, No acute distress  SKIN: No diaphoresis rash HEENT: Unremarkable RESPIRATORY: Breathing is even, unlabored. Lung sounds are clear   CARDIOVASCULAR: Heart RRR no murmurs, rubs or gallops. No peripheral edema  GASTROINTESTINAL: Abdomen is soft, non-tender, not distended w/ normal bowel sounds.  GENITOURINARY: Bladder non tender, not distended  MUSCULOSKELETAL: No abnormal joints or musculature NEUROLOGIC: Cranial nerves 2-12 grossly intact. Moves all extremities PSYCHIATRIC: Mood and affect with dementia, no behavioral issues  Patient Active Problem List   Diagnosis Date Noted  . Osteoporosis 03/18/2016  . Depression 12/23/2015  . Chronic diastolic heart failure (HCC) 09/05/2015  . Right leg pain 08/05/2015  . COPD (chronic obstructive pulmonary disease) (HCC) 06/25/2015  . Loss of weight 01/21/2015  . Anemia, iron deficiency 01/05/2015  . Acute upper respiratory infection 01/05/2015  . 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 Iron deficiency anemia 12/10/2014  . Stomach discomfort 11/30/2014  . Subacute confusional state 11/30/2014  . Acute respiratory failure with hypoxia (HCC) 11/11/2014  . AKI (acute kidney injury) (HCC) 11/11/2014  . Hypernatremia 11/11/2014  . Hypokalemia 11/09/2014  . COPD exacerbation (HCC)   . Acute on chronic diastolic heart failure (HCC)   . Hypoxia   . SOB  (shortness of breath)   . Pressure ulcer stage II 11/02/2014  . Palliative care encounter 11/02/2014  . HCAP (healthcare-associated pneumonia) 11/01/2014  . Leukocytosis 10/15/2014  . Closed fracture of part of upper end of humerus 10/14/2014  . Poor balance 11/25/2013  . History of stroke 11/25/2013  . 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 Myalgia 09/05/2012  . Disequilibrium 09/05/2012  . Hypertension, essential 09/05/2012  . 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 Mobility impaired 07/15/2012  . Physical exam, routine 12/07/2011  . Cerumen impaction 04/04/2011  . Trapezius muscle spasm 04/04/2011  . Hypertensive heart disease with CHF (congestive heart failure) (HCC) 11/30/2010  . Hyperlipidemia 11/30/2010  . CVA (cerebral infarction) 11/30/2010  . Postmenopausal HRT (hormone replacement therapy) 11/30/2010  . Anxiety 11/30/2010  . Ankle weakness 11/30/2010    CMP     Component Value Date/Time  NA 139 09/28/2016   K 4.4 09/28/2016   CL 102 10/13/2015 1505   CO2 22 10/13/2015 1505   GLUCOSE 118 (H) 10/13/2015 1505   BUN 21 09/28/2016   CREATININE 0.7 09/28/2016   CREATININE 0.79 10/13/2015 1505   CALCIUM 9.1 10/13/2015 1505   PROT 6.3 10/13/2015 1505   ALBUMIN 3.8 10/13/2015 1505   ALBUMIN 3.8 10/13/2015 1505   AST 17 09/28/2016   ALT 16 09/28/2016   ALKPHOS 90 09/28/2016   BILITOT 0.6 10/13/2015 1505   GFRNONAA >60 11/05/2014 0225   GFRAA >60 11/05/2014 0225    Recent Labs  03/01/16 09/28/16  NA 138 139  K 4.3 4.4  BUN 28* 21  CREATININE 0.6 0.7    Recent Labs  03/01/16 09/28/16  AST  --  17  ALT  --  16  ALKPHOS 88 90    Recent Labs  02/29/16 09/28/16  WBC 7.6 6.6  HGB 13.0 12.7  HCT 39 39  PLT 182 167    Recent Labs  03/01/16 09/28/16  CHOL 160 140  LDLCALC 58 77  TRIG 318* 144   No results found for: MICROALBUR Lab Results  Component Value Date   TSH 4.61 09/28/2016   Lab Results  Component Value Date   HGBA1C 5.6  09/28/2016   Lab Results  Component Value Date   CHOL 140 09/28/2016   HDL 35 09/28/2016   LDLCALC 77 09/28/2016   LDLDIRECT 190.1 05/06/2014   TRIG 144 09/28/2016   CHOLHDL 7 05/06/2014    Significant Diagnostic Results in last 30 days:  No results found.  Assessment and Plan  Chronic diastolic heart failure (HCC) No reported exacerbations; cont tenormin 50 mg BID; not on diuretic  Hypertensive heart disease with CHF (congestive heart failure) (HCC) Controlled ;contt norvasc 10 mg daily and tenormin 50 mg BID  COPD (chronic obstructive pulmonary disease) (HCC) No reported exacerbations on basically no meds; will monitor and cont allegra 180 mg daily    Beverly Black D. [name], MD

## 2016-09-24 ENCOUNTER — Encounter: Payer: Self-pay | Admitting: Internal Medicine

## 2016-09-24 NOTE — Assessment & Plan Note (Signed)
Most recent Hb 13;plan to cont iron 325 mg daily

## 2016-09-24 NOTE — Assessment & Plan Note (Signed)
Chronic and stable;cont ativan 0.5 mg q8

## 2016-09-24 NOTE — Assessment & Plan Note (Signed)
Chronic and stable;plan to cont remeron 25 mg qHs and lexapro 10 mg daily

## 2016-09-28 LAB — TSH: TSH: 4.61 u[IU]/mL (ref 0.41–5.90)

## 2016-09-28 LAB — BASIC METABOLIC PANEL
BUN: 21 mg/dL (ref 4–21)
Creatinine: 0.7 mg/dL (ref 0.5–1.1)
GLUCOSE: 101 mg/dL
POTASSIUM: 4.4 mmol/L (ref 3.4–5.3)
Sodium: 139 mmol/L (ref 137–147)

## 2016-09-28 LAB — HEPATIC FUNCTION PANEL
ALT: 16 U/L (ref 7–35)
AST: 17 U/L (ref 13–35)
Alkaline Phosphatase: 90 U/L (ref 25–125)
BILIRUBIN, TOTAL: 0.2 mg/dL

## 2016-09-28 LAB — LIPID PANEL
Cholesterol: 140 mg/dL (ref 0–200)
HDL: 35 mg/dL (ref 35–70)
LDL CALC: 77 mg/dL
TRIGLYCERIDES: 144 mg/dL (ref 40–160)

## 2016-09-28 LAB — CBC AND DIFFERENTIAL
HEMATOCRIT: 39 % (ref 36–46)
Hemoglobin: 12.7 g/dL (ref 12.0–16.0)
PLATELETS: 167 10*3/uL (ref 150–399)
WBC: 6.6 10^3/mL

## 2016-09-28 LAB — VITAMIN D 25 HYDROXY (VIT D DEFICIENCY, FRACTURES): Vit D, 25-Hydroxy: 30.81

## 2016-09-28 LAB — HEMOGLOBIN A1C: Hemoglobin A1C: 5.6

## 2016-10-15 ENCOUNTER — Encounter: Payer: Self-pay | Admitting: Internal Medicine

## 2016-10-15 NOTE — Assessment & Plan Note (Signed)
Controlled ;contt norvasc 10 mg daily and tenormin 50 mg BID

## 2016-10-15 NOTE — Assessment & Plan Note (Signed)
No reported exacerbations; cont tenormin 50 mg BID; not on diuretic

## 2016-10-15 NOTE — Assessment & Plan Note (Signed)
No reported exacerbations on basically no meds; will monitor and cont allegra 180 mg daily

## 2016-10-18 ENCOUNTER — Non-Acute Institutional Stay (SKILLED_NURSING_FACILITY): Payer: Medicare Other | Admitting: Internal Medicine

## 2016-10-18 ENCOUNTER — Encounter: Payer: Self-pay | Admitting: Internal Medicine

## 2016-10-18 DIAGNOSIS — M818 Other osteoporosis without current pathological fracture: Secondary | ICD-10-CM | POA: Diagnosis not present

## 2016-10-18 DIAGNOSIS — D508 Other iron deficiency anemias: Secondary | ICD-10-CM

## 2016-10-18 DIAGNOSIS — E782 Mixed hyperlipidemia: Secondary | ICD-10-CM

## 2016-10-18 NOTE — Progress Notes (Signed)
Location:  [facility] Room Number: 411P Place of Service:  SNF (31)  [name], MD  Patient Care Team: [name], MD as PCP - General (Internal Medicine)  Extended Emergency Contact Information Primary Emergency Contact: [name],[name] Address: [address]          [name] [address] [name] Home Phone: [phone] Relation: Daughter Secondary Emergency Contact: [name] [place] Home Phone: [phone] Relation: Daughter    Allergies: Sulfa antibiotics  Chief Complaint  Patient presents with  . Medical Management of Chronic Issues    Routine Visit    HPI: Patient is 83 y.o. female who Is being seen for routine issues of iron deficiency anemia, osteoporosis, and hyperlipidemia.  Past Medical History:  Diagnosis Date  . Acute on chronic diastolic heart failure (HCC)   . Aneurysm (HCC)    of the eye  . Anxiety 11/30/2010  . Closed fracture of part of upper end of humerus 10/14/2014  . COPD exacerbation (HCC)   . CVA (cerebral infarction) 11/30/2010  . Decubitus ulcer of sacral region, stage 4 (HCC)   . 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 Depression 12/23/2015  . History of stroke 11/25/2013  . Hyperlipidemia   . Hypertension   . Hypertensive heart disease with CHF (congestive heart failure) (HCC) 11/30/2010  . Iron deficiency anemia 12/10/2014  . Leukocytosis 10/15/2014  . Osteoporosis   . Postmenopausal HRT (hormone replacement therapy) 11/30/2010  . Stroke (HCC) 10/2008  . 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 EYE SURGERY    . PARTIAL HYSTERECTOMY  1968    Allergies as of 10/18/2016      Reactions   Sulfa Antibiotics    Cant remember reaction      Medication List       Accurate as of 10/18/16 11:59 PM. Always use your most recent med list.          amLODipine 10 MG tablet Commonly known as:  NORVASC TAKE 1 TABLET AT BEDTIME   aspirin 81 MG chewable tablet Chew 81 mg by mouth daily.   atenolol 50 MG tablet Commonly known as:  TENORMIN TAKE 1 TABLET TWICE A DAY   atorvastatin 40 MG tablet Commonly known as:  LIPITOR TAKE 1 TABLET DAILY   cholecalciferol 1000 units tablet Commonly known as:  VITAMIN D Take 1,000 Units by mouth daily.   docusate sodium 100 MG capsule Commonly known as:  COLACE Take 100 mg by mouth 2 (two) times daily.   escitalopram 10 MG tablet Commonly known as:  LEXAPRO Take 10 mg by mouth daily.   ferrous sulfate 325 (65 FE) MG tablet Take 325 mg by mouth daily with breakfast.   fexofenadine 180 MG tablet Commonly known as:  ALLEGRA Take 180 mg by mouth daily.   LORazepam 0.5 MG tablet Commonly known as:  ATIVAN Take 1 tablet (0.5 mg total) by mouth every 8 (eight) hours.   memantine 10 MG tablet Commonly known as:  NAMENDA Take 10 mg by mouth 2 (two) times daily.   mirtazapine 15 MG tablet Commonly known as:  REMERON Take 15 mg by mouth at bedtime.   oxyCODONE 5 MG immediate release tablet Commonly known as:  Oxy IR/ROXICODONE Take 1 tablet (5 mg total) by mouth every 6 (six) hours as needed for severe pain.  pantoprazole 40 MG tablet Commonly known as:  PROTONIX Take 1 tablet (40 mg total) by mouth 2 (two) times daily.   polyethylene glycol packet Commonly known as:  MIRALAX / GLYCOLAX Take 17 g by mouth daily. To be hold for diarrhea   Potassium Chloride ER 20 MEQ Tbcr Take 1 tablet by mouth daily.   sennosides-docusate sodium 8.6-50 MG tablet Commonly known as:  SENOKOT-S Take 1 tablet by mouth 2 (two) times daily.   TAB-A-VITE Tabs Take 1 tablet by mouth daily.   traMADol 50 MG tablet Commonly known as:  ULTRAM Give 1 tablet by mouth every 8 hours scheduled for pain   vitamin C 500 MG tablet Commonly known as:  ASCORBIC ACID Take 500 mg by mouth 2 (two)  times daily.   ZETIA 10 MG tablet Generic drug:  ezetimibe TAKE 1 TABLET DAILY       No orders of the defined types were placed in this encounter.   Immunization History  Administered Date(s) Administered  . Influenza,inj,Quad PF,36+ Mos 04/16/2013, 05/06/2014  . Influenza-Unspecified 02/03/2015, 02/08/2016  . PPD Test 10/14/2014    Social History  Substance Use Topics  . Smoking status: Former Smoker    Packs/day: 0.50    Years: 50.00  . Smokeless tobacco: Never Used  . Alcohol use No    Review of Systems  DATA OBTAINED: from patient, nurse GENERAL:  no fevers, fatigue, appetite changes SKIN: No itching, rash HEENT: No complaint RESPIRATORY: No cough, wheezing, SOB CARDIAC: No chest pain, palpitations, lower extremity edema  GI: No abdominal pain, No N/V/D or constipation, No heartburn or reflux  GU: No dysuria, frequency or urgency, or incontinence  MUSCULOSKELETAL: No unrelieved bone/joint pain NEUROLOGIC: No headache, dizziness  PSYCHIATRIC: No overt anxiety or sadness  Vitals:   10/18/16 1154  BP: 135/72  Pulse: 69  Resp: 18  Temp: 97.5 F (36.4 C)   Body mass index is 24.55 kg/m. Physical Exam  GENERAL APPEARANCE: Alert, conversant, No acute distress  SKIN: No diaphoresis rash HEENT: Unremarkable RESPIRATORY: Breathing is even, unlabored. Lung sounds are clear   CARDIOVASCULAR: Heart RRR no murmurs, rubs or gallops. No peripheral edema  GASTROINTESTINAL: Abdomen is soft, non-tender, not distended w/ normal bowel sounds.  GENITOURINARY: Bladder non tender, not distended  MUSCULOSKELETAL: No abnormal joints or musculature NEUROLOGIC: Cranial nerves 2-12 grossly intact. Moves all extremities PSYCHIATRIC: Mood and affect With dementia, no behavioral issues  Patient Active Problem List   Diagnosis Date Noted  . Osteoporosis 03/18/2016  . Depression 12/23/2015  . Chronic diastolic heart failure (HCC) 09/05/2015  . Right leg pain 08/05/2015  .  COPD (chronic obstructive pulmonary disease) (HCC) 06/25/2015  . Loss of weight 01/21/2015  . Anemia, iron deficiency 01/05/2015  . Acute upper respiratory infection 01/05/2015  . Iron deficiency anemia 12/10/2014  . Stomach discomfort 11/30/2014  . Subacute confusional state 11/30/2014  . Acute respiratory failure with hypoxia (HCC) 11/11/2014  . AKI (acute kidney injury) (HCC) 11/11/2014  . Hypernatremia 11/11/2014  . Hypokalemia 11/09/2014  . COPD exacerbation (HCC)   . Acute on chronic diastolic heart failure (HCC)   . Hypoxia   . SOB (shortness of breath)   . Pressure ulcer stage II 11/02/2014  . Palliative care encounter 11/02/2014  . HCAP (healthcare-associated pneumonia) 11/01/2014  . Leukocytosis 10/15/2014  . Closed fracture of part of upper end of humerus 10/14/2014  . Poor balance 11/25/2013  . History of stroke 11/25/2013  . 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  Myalgia 09/05/2012  . Disequilibrium 09/05/2012  . Hypertension, essential 09/05/2012  . 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 Mobility impaired 07/15/2012  . Physical exam, routine 12/07/2011  . Cerumen impaction 04/04/2011  . Trapezius muscle spasm 04/04/2011  . Hypertensive heart disease with CHF (congestive heart failure) (HCC) 11/30/2010  . Hyperlipidemia 11/30/2010  . CVA (cerebral infarction) 11/30/2010  . Postmenopausal HRT (hormone replacement therapy) 11/30/2010  . Anxiety 11/30/2010  . Ankle weakness 11/30/2010    CMP     Component Value Date/Time   NA 139 09/28/2016   K 4.4 09/28/2016   CL 102 10/13/2015 1505   CO2 22 10/13/2015 1505   GLUCOSE 118 (H) 10/13/2015 1505   BUN 21 09/28/2016   CREATININE 0.7 09/28/2016   CREATININE 0.79 10/13/2015 1505   CALCIUM 9.1 10/13/2015 1505   PROT 6.3 10/13/2015 1505   ALBUMIN 3.8 10/13/2015 1505   ALBUMIN 3.8 10/13/2015 1505   AST 17 09/28/2016   ALT 16 09/28/2016   ALKPHOS 90 09/28/2016   BILITOT 0.6 10/13/2015 1505   GFRNONAA  >60 11/05/2014 0225   GFRAA >60 11/05/2014 0225    Recent Labs  03/01/16 09/28/16  NA 138 139  K 4.3 4.4  BUN 28* 21  CREATININE 0.6 0.7    Recent Labs  03/01/16 09/28/16  AST  --  17  ALT  --  16  ALKPHOS 88 90    Recent Labs  02/29/16 09/28/16  WBC 7.6 6.6  HGB 13.0 12.7  HCT 39 39  PLT 182 167    Recent Labs  03/01/16 09/28/16  CHOL 160 140  LDLCALC 58 77  TRIG 318* 144   No results found for: MICROALBUR Lab Results  Component Value Date   TSH 4.61 09/28/2016   Lab Results  Component Value Date   HGBA1C 5.6 09/28/2016   Lab Results  Component Value Date   CHOL 140 09/28/2016   HDL 35 09/28/2016   LDLCALC 77 09/28/2016   LDLDIRECT 190.1 05/06/2014   TRIG 144 09/28/2016   CHOLHDL 7 05/06/2014    Significant Diagnostic Results in last 30 days:  No results found.  Assessment and Plan  Anemia, iron deficiency Most recent hemoglobin is 12.7, which is stable from prior; plan to continue iron 325 mg by mouth daily  Osteoporosis No fractures or falls; plan to continue vitamin D 1000 units by mouth daily  Hyperlipidemia Most recent LDL is 77, most recent HDL is 35,-very good control; plan to continue Lipitor 40 mg by mouth daily    Ronnita Paz D. Lyn HollingsheadAlexander, MD

## 2016-11-20 ENCOUNTER — Encounter: Payer: Self-pay | Admitting: Internal Medicine

## 2016-11-20 ENCOUNTER — Non-Acute Institutional Stay (SKILLED_NURSING_FACILITY): Payer: Medicare Other | Admitting: Internal Medicine

## 2016-11-20 DIAGNOSIS — F419 Anxiety disorder, unspecified: Secondary | ICD-10-CM

## 2016-11-20 DIAGNOSIS — F32A Depression, unspecified: Secondary | ICD-10-CM

## 2016-11-20 DIAGNOSIS — F329 Major depressive disorder, single episode, unspecified: Secondary | ICD-10-CM | POA: Diagnosis not present

## 2016-11-20 DIAGNOSIS — D508 Other iron deficiency anemias: Secondary | ICD-10-CM

## 2016-11-20 NOTE — Progress Notes (Signed)
[facility] Room Number: 411 Place of Service:  SNF ([phone])  Margit HanksAlexander, Nicholaos Schippers D, MD  Patient Care Team: Margit HanksAlexander, Anahis Furgeson D, MD as PCP - General (Internal Medicine)  Extended Emergency Contact Information Primary Emergency Contact: [name],[name] Address: [address]          [place] [id number] [place] Home Phone: [phone] Relation: Daughter Secondary Emergency Contact: [name],[name]  [place] Home Phone: [phone] Relation: Daughter    Allergies: Sulfa antibiotics  Chief Complaint  Patient presents with  . Medical Management of Chronic Issues    routine visit    HPI: Patient is 83 y.o. female who Is being seen for routine issues of anxiety, depression, and iron deficiency anemia.  Past Medical History:  Diagnosis Date  . Acute on chronic diastolic heart failure (HCC)   . Aneurysm (HCC)    of the eye  . Anxiety 11/30/2010  . Closed fracture of part of upper end of humerus 10/14/2014  . COPD exacerbation (HCC)   . CVA (cerebral infarction) 11/30/2010  . Decubitus ulcer of sacral region, stage 4 (HCC)   . 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 Depression 12/23/2015  . History of stroke 11/25/2013  . Hyperlipidemia   . Hypertension   . Hypertensive heart disease with CHF (congestive heart failure) (HCC) 11/30/2010  . Iron deficiency anemia 12/10/2014  . Leukocytosis 10/15/2014  . Osteoporosis   . Postmenopausal HRT (hormone replacement therapy) 11/30/2010  . Stroke (HCC) 10/2008  . 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 EYE SURGERY    . PARTIAL HYSTERECTOMY  1968    Allergies as of 11/20/2016      Reactions   Sulfa Antibiotics    Cant remember reaction      Medication List       Accurate as of 11/20/16 11:59 PM. Always use your most recent med list.            amLODipine 10 MG tablet Commonly known as:  NORVASC TAKE 1 TABLET AT BEDTIME   aspirin 81 MG chewable tablet Chew 81 mg by mouth daily.   atenolol 50 MG tablet Commonly known as:  TENORMIN TAKE 1 TABLET TWICE A DAY   atorvastatin 40 MG tablet Commonly known as:  LIPITOR TAKE 1 TABLET DAILY   cholecalciferol 1000 units tablet Commonly known as:  VITAMIN D Take 1,000 Units by mouth daily.   docusate sodium 100 MG capsule Commonly known as:  COLACE Take 100 mg by mouth 2 (two) times daily.   escitalopram 10 MG tablet Commonly known as:  LEXAPRO Take 10 mg by mouth daily.   ferrous sulfate 325 (65 FE) MG tablet Take 325 mg by mouth daily with breakfast.   fexofenadine 180 MG tablet Commonly known as:  ALLEGRA Take 180 mg by mouth daily.   LORazepam 0.5 MG tablet Commonly known as:  ATIVAN Take 1 tablet (0.5 mg total) by mouth every 8 (eight) hours.   memantine 10 MG tablet Commonly known as:  NAMENDA Take 10 mg by mouth 2 (two) times daily.   mirtazapine 7.5 MG tablet Commonly known as:  REMERON Take 7.5 mg by mouth. Take one tablet at bedtime   oxyCODONE 5 MG immediate release tablet Commonly known as:  Oxy IR/ROXICODONE Take 1 tablet (5 mg total) by mouth every 6 (six) hours as  needed for severe pain.   pantoprazole 40 MG tablet Commonly known as:  PROTONIX Take 1 tablet (40 mg total) by mouth 2 (two) times daily.   polyethylene glycol packet Commonly known as:  MIRALAX / GLYCOLAX Take 17 g by mouth daily. To be hold for diarrhea   Potassium Chloride ER 20 MEQ Tbcr Take 1 tablet by mouth daily.   sennosides-docusate sodium 8.6-50 MG tablet Commonly known as:  SENOKOT-S Take 1 tablet by mouth 2 (two) times daily.   TAB-A-VITE Tabs Take 1 tablet by mouth daily.   traMADol 50 MG tablet Commonly known as:  ULTRAM Give 1 tablet by mouth every 8 hours scheduled for pain   vitamin C 500 MG tablet Commonly known as:  ASCORBIC ACID Take 500 mg by mouth  2 (two) times daily.   ZETIA 10 MG tablet Generic drug:  ezetimibe TAKE 1 TABLET DAILY       Meds ordered this encounter  Medications  . mirtazapine (REMERON) 7.5 MG tablet    Sig: Take 7.5 mg by mouth. Take one tablet at bedtime    Immunization History  Administered Date(s) Administered  . Influenza,inj,Quad PF,36+ Mos 04/16/2013, 05/06/2014  . Influenza-Unspecified 02/03/2015, 02/08/2016  . PPD Test 10/14/2014    Social History  Substance Use Topics  . Smoking status: Former Smoker    Packs/day: 0.50    Years: 50.00  . Smokeless tobacco: Never Used  . Alcohol use No    Review of Systems  DATA OBTAINED: from patient, nurse GENERAL:  no fevers, fatigue, appetite changes SKIN: No itching, rash HEENT: No complaint RESPIRATORY: No cough, wheezing, SOB CARDIAC: No chest pain, palpitations, lower extremity edema  GI: No abdominal pain, No N/V/D or constipation, No heartburn or reflux  GU: No dysuria, frequency or urgency, or incontinence  MUSCULOSKELETAL: No unrelieved bone/joint pain NEUROLOGIC: No headache, dizziness  PSYCHIATRIC: No overt anxiety or sadness  Vitals:   11/20/16 1405  BP: 135/72  Pulse: 80  Resp: 20  Temp: (!) 97.5 F (36.4 C)   Body mass index is 24.72 kg/m. Physical Exam  GENERAL APPEARANCE: Alert, conversant, No acute distress  SKIN: No diaphoresis rash HEENT: Unremarkable RESPIRATORY: Breathing is even, unlabored. Lung sounds are clear   CARDIOVASCULAR: Heart RRR no murmurs, rubs or gallops. No peripheral edema  GASTROINTESTINAL: Abdomen is soft, non-tender, not distended w/ normal bowel sounds.  GENITOURINARY: Bladder non tender, not distended  MUSCULOSKELETAL: No abnormal joints or musculature NEUROLOGIC: Cranial nerves 2-12 grossly intact. Moves all extremities PSYCHIATRIC: Mood and affect With dementia, no behavioral issues  Patient Active Problem List   Diagnosis Date Noted  . Osteoporosis 03/18/2016  . Depression 12/23/2015   . Chronic diastolic heart failure (HCC) 09/05/2015  . Right leg pain 08/05/2015  . COPD (chronic obstructive pulmonary disease) (HCC) 06/25/2015  . Loss of weight 01/21/2015  . Anemia, iron deficiency 01/05/2015  . Acute upper respiratory infection 01/05/2015  . Iron deficiency anemia 12/10/2014  . Stomach discomfort 11/30/2014  . Subacute confusional state 11/30/2014  . Acute respiratory failure with hypoxia (HCC) 11/11/2014  . AKI (acute kidney injury) (HCC) 11/11/2014  . Hypernatremia 11/11/2014  . Hypokalemia 11/09/2014  . COPD exacerbation (HCC)   . Acute on chronic diastolic heart failure (HCC)   . Hypoxia   . SOB (shortness of breath)   . Pressure ulcer stage II 11/02/2014  . Palliative care encounter 11/02/2014  . HCAP (healthcare-associated pneumonia) 11/01/2014  . Leukocytosis 10/15/2014  . Closed fracture of part of  upper end of humerus 10/14/2014  . Poor balance 11/25/2013  . History of stroke 11/25/2013  . 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 Myalgia 09/05/2012  . Disequilibrium 09/05/2012  . Hypertension, essential 09/05/2012  . 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 Mobility impaired 07/15/2012  . Physical exam, routine 12/07/2011  . Cerumen impaction 04/04/2011  . Trapezius muscle spasm 04/04/2011  . Hypertensive heart disease with CHF (congestive heart failure) (HCC) 11/30/2010  . Hyperlipidemia 11/30/2010  . CVA (cerebral infarction) 11/30/2010  . Postmenopausal HRT (hormone replacement therapy) 11/30/2010  . Anxiety 11/30/2010  . Ankle weakness 11/30/2010    CMP     Component Value Date/Time   NA 139 09/28/2016   K 4.4 09/28/2016   CL 102 10/13/2015 1505   CO2 22 10/13/2015 1505   GLUCOSE 118 (H) 10/13/2015 1505   BUN 21 09/28/2016   CREATININE 0.7 09/28/2016   CREATININE 0.79 10/13/2015 1505   CALCIUM 9.1 10/13/2015 1505   PROT 6.3 10/13/2015 1505   ALBUMIN 3.8 10/13/2015 1505   ALBUMIN 3.8 10/13/2015 1505   AST 17 09/28/2016    ALT 16 09/28/2016   ALKPHOS 90 09/28/2016   BILITOT 0.6 10/13/2015 1505   GFRNONAA >60 11/05/2014 0225   GFRAA >60 11/05/2014 0225    Recent Labs  03/01/16 09/28/16  NA 138 139  K 4.3 4.4  BUN 28* 21  CREATININE 0.6 0.7    Recent Labs  03/01/16 09/28/16  AST  --  17  ALT  --  16  ALKPHOS 88 90    Recent Labs  02/29/16 09/28/16  WBC 7.6 6.6  HGB 13.0 12.7  HCT 39 39  PLT 182 167    Recent Labs  03/01/16 09/28/16  CHOL 160 140  LDLCALC 58 77  TRIG 318* 144   No results found for: MICROALBUR Lab Results  Component Value Date   TSH 4.61 09/28/2016   Lab Results  Component Value Date   HGBA1C 5.6 09/28/2016   Lab Results  Component Value Date   CHOL 140 09/28/2016   HDL 35 09/28/2016   LDLCALC 77 09/28/2016   LDLDIRECT 190.1 05/06/2014   TRIG 144 09/28/2016   CHOLHDL 7 05/06/2014    Significant Diagnostic Results in last 30 days:  No results found.  Assessment and Plan  Anxiety Appears well controlled; continue Ativan 0.5 mg every 8 scheduled  Depression Stable;; continue Lexapro 10 mg by mouth daily and Remeron 7.5 mg by mouth daily at bedtime  Iron deficiency anemia Last hemoglobin 12.7, which is stable from prior; plan to continue iron 325 mg by mouth daily    Rayla Pember D. [name], MD

## 2016-11-22 ENCOUNTER — Encounter: Payer: Self-pay | Admitting: Internal Medicine

## 2016-11-22 NOTE — Assessment & Plan Note (Signed)
No fractures or falls; plan to continue vitamin D 1000 units by mouth daily

## 2016-11-22 NOTE — Assessment & Plan Note (Signed)
Most recent LDL is 77, most recent HDL is 35,-very good control; plan to continue Lipitor 40 mg by mouth daily

## 2016-11-22 NOTE — Assessment & Plan Note (Signed)
Most recent hemoglobin is 12.7, which is stable from prior; plan to continue iron 325 mg by mouth daily

## 2016-11-29 ENCOUNTER — Non-Acute Institutional Stay (SKILLED_NURSING_FACILITY): Payer: Medicare Other

## 2016-11-29 DIAGNOSIS — Z0000 Encounter for general adult medical examination without abnormal findings: Secondary | ICD-10-CM

## 2016-11-29 NOTE — Patient Instructions (Signed)
Beverly Black , Thank you for taking time to come for your Medicare Wellness Visit. I appreciate your ongoing commitment to your health goals. Please review the following plan we discussed and let me know if I can assist you in the future.   Screening recommendations/referrals: Colonoscopy excluded, pt over age 81 Mammogram excluded, pt over age 81 Bone Density up to date Recommended yearly ophthalmology/optometry visit for glaucoma screening and checkup Recommended yearly dental visit for hygiene and checkup  Vaccinations: Influenza vaccine due fall 2018 season Pneumococcal vaccine 13, due, past ordered Tdap vaccine due, ordered Shingles vaccine not in records  Advanced directives: DNR in chart, copies of health care power of attorney and living will are needed   Conditions/risks identified: None  Next appointment: Dr. Lyn HollingsheadAlexander makes rounds   Preventive Care 65 Years and Older, Female Preventive care refers to lifestyle choices and visits with your health care provider that can promote health and wellness. What does preventive care include?  A yearly physical exam. This is also called an annual well check.  Dental exams once or twice a year.  Routine eye exams. Ask your health care provider how often you should have your eyes checked.  Personal lifestyle choices, including:  Daily care of your teeth and gums.  Regular physical activity.  Eating a healthy diet.  Avoiding tobacco and drug use.  Limiting alcohol use.  Practicing safe sex.  Taking low-dose aspirin every day.  Taking vitamin and mineral supplements as recommended by your health care provider. What happens during an annual well check? The services and screenings done by your health care provider during your annual well check will depend on your age, overall health, lifestyle risk factors, and family history of disease. Counseling  Your health care provider may ask you questions about your:  Alcohol  use.  Tobacco use.  Drug use.  Emotional well-being.  Home and relationship well-being.  Sexual activity.  Eating habits.  History of falls.  Memory and ability to understand (cognition).  Work and work Astronomerenvironment.  Reproductive health. Screening  You may have the following tests or measurements:  Height, weight, and BMI.  Blood pressure.  Lipid and cholesterol levels. These may be checked every 5 years, or more frequently if you are over 81 years old.  Skin check.  Lung cancer screening. You may have this screening every year starting at age 81 if you have a 30-pack-year history of smoking and currently smoke or have quit within the past 15 years.  Fecal occult blood test (FOBT) of the stool. You may have this test every year starting at age 81.  Flexible sigmoidoscopy or colonoscopy. You may have a sigmoidoscopy every 5 years or a colonoscopy every 10 years starting at age 81.  Hepatitis C blood test.  Hepatitis B blood test.  Sexually transmitted disease (STD) testing.  Diabetes screening. This is done by checking your blood sugar (glucose) after you have not eaten for a while (fasting). You may have this done every 1-3 years.  Bone density scan. This is done to screen for osteoporosis. You may have this done starting at age 81.  Mammogram. This may be done every 1-2 years. Talk to your health care provider about how often you should have regular mammograms. Talk with your health care provider about your test results, treatment options, and if necessary, the need for more tests. Vaccines  Your health care provider may recommend certain vaccines, such as:  Influenza vaccine. This is recommended every year.  Tetanus, diphtheria, and acellular pertussis (Tdap, Td) vaccine. You may need a Td booster every 10 years.  Zoster vaccine. You may need this after age 11.  Pneumococcal 13-valent conjugate (PCV13) vaccine. One dose is recommended after age  6.  Pneumococcal polysaccharide (PPSV23) vaccine. One dose is recommended after age 69. Talk to your health care provider about which screenings and vaccines you need and how often you need them. This information is not intended to replace advice given to you by your health care provider. Make sure you discuss any questions you have with your health care provider. Document Released: 05/21/2015 Document Revised: 01/12/2016 Document Reviewed: 02/23/2015 Elsevier Interactive Patient Education  2017 [place] Prevention in the Home Falls can cause injuries. They can happen to people of all ages. There are many things you can do to make your home safe and to help prevent falls. What can I do on the outside of my home?  Regularly fix the edges of walkways and driveways and fix any cracks.  Remove anything that might make you trip as you walk through a door, such as a raised step or threshold.  Trim any bushes or trees on the path to your home.  Use bright outdoor lighting.  Clear any walking paths of anything that might make someone trip, such as rocks or tools.  Regularly check to see if handrails are loose or broken. Make sure that both sides of any steps have handrails.  Any raised decks and porches should have guardrails on the edges.  Have any leaves, snow, or ice cleared regularly.  Use sand or salt on walking paths during winter.  Clean up any spills in your garage right away. This includes oil or grease spills. What can I do in the bathroom?  Use night lights.  Install grab bars by the toilet and in the tub and shower. Do not use towel bars as grab bars.  Use non-skid mats or decals in the tub or shower.  If you need to sit down in the shower, use a plastic, non-slip stool.  Keep the floor dry. Clean up any water that spills on the floor as soon as it happens.  Remove soap buildup in the tub or shower regularly.  Attach bath mats securely with double-sided  non-slip rug tape.  Do not have throw rugs and other things on the floor that can make you trip. What can I do in the bedroom?  Use night lights.  Make sure that you have a light by your bed that is easy to reach.  Do not use any sheets or blankets that are too big for your bed. They should not hang down onto the floor.  Have a firm chair that has side arms. You can use this for support while you get dressed.  Do not have throw rugs and other things on the floor that can make you trip. What can I do in the kitchen?  Clean up any spills right away.  Avoid walking on wet floors.  Keep items that you use a lot in easy-to-reach places.  If you need to reach something above you, use a strong step stool that has a grab bar.  Keep electrical cords out of the way.  Do not use floor polish or wax that makes floors slippery. If you must use wax, use non-skid floor wax.  Do not have throw rugs and other things on the floor that can make you trip. What can I do  with my stairs?  Do not leave any items on the stairs.  Make sure that there are handrails on both sides of the stairs and use them. Fix handrails that are broken or loose. Make sure that handrails are as long as the stairways.  Check any carpeting to make sure that it is firmly attached to the stairs. Fix any carpet that is loose or worn.  Avoid having throw rugs at the top or bottom of the stairs. If you do have throw rugs, attach them to the floor with carpet tape.  Make sure that you have a light switch at the top of the stairs and the bottom of the stairs. If you do not have them, ask someone to add them for you. What else can I do to help prevent falls?  Wear shoes that:  Do not have high heels.  Have rubber bottoms.  Are comfortable and fit you well.  Are closed at the toe. Do not wear sandals.  If you use a stepladder:  Make sure that it is fully opened. Do not climb a closed stepladder.  Make sure that both  sides of the stepladder are locked into place.  Ask someone to hold it for you, if possible.  Clearly mark and make sure that you can see:  Any grab bars or handrails.  First and last steps.  Where the edge of each step is.  Use tools that help you move around (mobility aids) if they are needed. These include:  Canes.  Walkers.  Scooters.  Crutches.  Turn on the lights when you go into a dark area. Replace any light bulbs as soon as they burn out.  Set up your furniture so you have a clear path. Avoid moving your furniture around.  If any of your floors are uneven, fix them.  If there are any pets around you, be aware of where they are.  Review your medicines with your doctor. Some medicines can make you feel dizzy. This can increase your chance of falling. Ask your doctor what other things that you can do to help prevent falls. This information is not intended to replace advice given to you by your health care provider. Make sure you discuss any questions you have with your health care provider. Document Released: 02/18/2009 Document Revised: 09/30/2015 Document Reviewed: 05/29/2014 Elsevier Interactive Patient Education  2017 [employer].

## 2016-11-29 NOTE — Progress Notes (Signed)
Subjective:   Beverly Black is a 83 y.o. female who presents for Medicare Annual (Subsequent) preventive examination at [facility]  Last AWV-10/16/13    Objective:     Vitals: BP 120/60 (BP Location: Left Arm, Patient Position: Sitting)   Pulse 66   Temp 98.1 F (36.7 C) (Oral)   Ht 5\' 4"  (1.626 m)   Wt 144 lb (65.3 kg)   BMI 24.72 kg/m   Body mass index is 24.72 kg/m.   Tobacco History  Smoking Status  . Former Smoker  . Packs/day: 0.50  . Years: 50.00  Smokeless Tobacco  . Never Used     Counseling given: Not Answered   Past Medical History:  Diagnosis Date  . Acute on chronic diastolic heart failure (HCC)   . Aneurysm (HCC)    of the eye  . Anxiety 11/30/2010  . Closed fracture of part of upper end of humerus 10/14/2014  . COPD exacerbation (HCC)   . CVA (cerebral infarction) 11/30/2010  . Decubitus ulcer of sacral region, stage 4 (HCC)   . 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 Depression 12/23/2015  . History of stroke 11/25/2013  . Hyperlipidemia   . Hypertension   . Hypertensive heart disease with CHF (congestive heart failure) (HCC) 11/30/2010  . Iron deficiency anemia 12/10/2014  . Leukocytosis 10/15/2014  . Osteoporosis   . Postmenopausal HRT (hormone replacement therapy) 11/30/2010  . Stroke (HCC) 10/2008  . 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 EYE SURGERY    . PARTIAL HYSTERECTOMY  1968   Family History  Problem Relation Age of Onset  . Stroke Father   . Heart failure Mother    History  Sexual Activity  . Sexual activity: No    Outpatient Encounter Prescriptions as of 11/29/2016  Medication Sig  . amLODipine (NORVASC) 10 MG tablet TAKE 1 TABLET AT BEDTIME  . aspirin 81 MG chewable tablet Chew 81 mg by mouth daily.  [name]. atenolol (TENORMIN) 50 MG tablet TAKE 1 TABLET TWICE A DAY  . atorvastatin (LIPITOR) 40 MG  tablet TAKE 1 TABLET DAILY  . cholecalciferol (VITAMIN D) 1000 UNITS tablet Take 1,000 Units by mouth daily.  [name]. docusate sodium (COLACE) 100 MG capsule Take 100 mg by mouth 2 (two) times daily.   [name]. escitalopram (LEXAPRO) 10 MG tablet Take 10 mg by mouth daily.  . ferrous sulfate 325 (65 FE) MG tablet Take 325 mg by mouth daily with breakfast.  . fexofenadine (ALLEGRA) 180 MG tablet Take 180 mg by mouth daily.  [name]. LORazepam (ATIVAN) 0.5 MG tablet Take 1 tablet (0.5 mg total) by mouth every 8 (eight) hours.  . memantine (NAMENDA) 10 MG tablet Take 10 mg by mouth 2 (two) times daily.  . mirtazapine (REMERON) 7.5 MG tablet Take 7.5 mg by mouth. Take one tablet at bedtime  . Multiple Vitamin (TAB-A-VITE) TABS Take 1 tablet by mouth daily.  [name]. oxyCODONE (OXY IR/ROXICODONE) 5 MG immediate release tablet Take 1 tablet (5 mg total) by mouth every 6 (six) hours as needed for severe pain.  . pantoprazole (PROTONIX) 40 MG tablet Take 1 tablet (40 mg total) by mouth 2 (two) times daily.  . polyethylene glycol (MIRALAX / GLYCOLAX) packet Take 17 g by mouth daily. To be hold for diarrhea  . Potassium Chloride ER 20 MEQ TBCR Take 1 tablet by mouth daily.  .[name]  sennosides-docusate sodium (SENOKOT-S) 8.6-50 MG tablet Take 1 tablet by mouth 2 (two) times daily.  . traMADol (ULTRAM) 50 MG tablet Give 1 tablet by mouth every 8 hours scheduled for pain  . vitamin C (ASCORBIC ACID) 500 MG tablet Take 500 mg by mouth 2 (two) times daily.  [name] ZETIA 10 MG tablet TAKE 1 TABLET DAILY   No facility-administered encounter medications on file as of 11/29/2016.     Activities of Daily Living In your present state of health, do you have any difficulty performing the following activities: 11/29/2016  Hearing? Y  Vision? Y  Difficulty concentrating or making decisions? Y  Walking or climbing stairs? Y  Dressing or bathing? Y  Doing errands, shopping? Y  Preparing Food and eating ? Y  Using the Toilet? Y  In the past six  months, have you accidently leaked urine? Y  Do you have problems with loss of bowel control? Y  Managing your Medications? Y  Managing your Finances? Y  Housekeeping or managing your Housekeeping? Y  Some recent data might be hidden    Patient Care Team: [name], MD as PCP - General (Internal Medicine)    Assessment:     Exercise Activities and Dietary recommendations Current Exercise Habits: The patient does not participate in regular exercise at present, Exercise limited by: neurologic condition(s);orthopedic condition(s)  Goals    None     Fall Risk Fall Risk  11/29/2016 05/06/2014 04/16/2013  Falls in the past year? No Yes Yes  Number falls in past yr: - 2 or more 1  Injury with Fall? - - No  Risk Factor Category  - High Fall Risk -  Risk for fall due to : - History of fall(s);Impaired balance/gait -   Depression Screen PHQ 2/9 Scores 11/29/2016 05/06/2014 04/16/2013 06/05/2011  PHQ - 2 Score 0 0 0 0     Cognitive Function     6CIT Screen 11/29/2016  What Year? 0 points  What month? 0 points  What time? 0 points  Count back from 20 0 points  Months in reverse 0 points  Repeat phrase 10 points  Total Score 10    Immunization History  Administered Date(s) Administered  . Influenza,inj,Quad PF,36+ Mos 04/16/2013, 05/06/2014  . Influenza-Unspecified 02/03/2015, 02/08/2016  . PPD Test 10/14/2014   Screening Tests Health Maintenance  Topic Date Due  . PNA vac Low Risk Adult (1 of 2 - PCV13) 01/07/1996  . TETANUS/TDAP  08/06/2023 (Originally 01/06/1948)  . INFLUENZA VACCINE  12/06/2016  . DEXA SCAN  Completed      Plan:    I have personally reviewed and addressed the Medicare Annual Wellness questionnaire and have noted the following in the patient's chart:  A. Medical and social history B. Use of alcohol, tobacco or illicit drugs  C. Current medications and supplements D. Functional ability and status E.  Nutritional status F.  Physical  activity G. Advance directives H. List of other physicians I.  Hospitalizations, surgeries, and ER visits in previous 12 months J.  Vitals K. Screenings to include hearing, vision, cognitive, depression L. Referrals and appointments - none  In addition, I have reviewed and discussed with patient certain preventive protocols, quality metrics, and best practice recommendations. A written personalized care plan for preventive services as well as general preventive health recommendations were provided to patient.  See attached scanned questionnaire for additional information.   Signed,   Annetta Maw, RN Nurse Health Advisor   Quick Notes  Health Maintenance: PNA 13, TDAP due     Abnormal Screen: 6 CIT-10     Patient Concerns: None     Nurse Concerns: None

## 2016-12-18 ENCOUNTER — Non-Acute Institutional Stay (SKILLED_NURSING_FACILITY): Payer: Medicare Other | Admitting: Internal Medicine

## 2016-12-18 ENCOUNTER — Encounter: Payer: Self-pay | Admitting: Internal Medicine

## 2016-12-18 DIAGNOSIS — R0902 Hypoxemia: Secondary | ICD-10-CM | POA: Diagnosis not present

## 2016-12-18 DIAGNOSIS — R059 Cough, unspecified: Secondary | ICD-10-CM

## 2016-12-18 DIAGNOSIS — R05 Cough: Secondary | ICD-10-CM

## 2016-12-18 NOTE — Progress Notes (Signed)
Location:  [facility] Room Number: [phone] Place of Service:  SNF ([phone])  Beverly Black, Beverly Mcnealy D, MD  Patient Care Team: Beverly Black, Rashmi Tallent D, MD as PCP - General (Internal Medicine)  Extended Emergency Contact Information Primary Emergency Contact: Beverly Black Address: [address]          [place] [id number] [place] Home Phone: [phone] Relation: Daughter Secondary Emergency Contact: [name],[name]  [place] Home Phone: [phone] Relation: Daughter    Allergies: Sulfa antibiotics  Chief Complaint  Patient presents with  . Acute Visit    HPI: Patient is 83 y.o. female who Nursing asked me to see because patient has had a cough and has been Black satting shin down to 89-90% on room air. she is not normally on O2. Patient has no wheezing fever nausea vomiting diarrhea chills muscle aches or any other systemic symptom.  Past Medical History:  Diagnosis Date  . Acute on chronic diastolic heart failure (HCC)   . Aneurysm (HCC)    of the eye  . Anxiety 11/30/2010  . Closed fracture of part of upper end of humerus 10/14/2014  . COPD exacerbation (HCC)   . CVA (cerebral infarction) 11/30/2010  . Decubitus ulcer of sacral region, stage 4 (HCC)   . 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 Depression 12/23/2015  . History of stroke 11/25/2013  . Hyperlipidemia   . Hypertension   . Hypertensive heart disease with CHF (congestive heart failure) (HCC) 11/30/2010  . Iron deficiency anemia 12/10/2014  . Leukocytosis 10/15/2014  . Osteoporosis   . Postmenopausal HRT (hormone replacement therapy) 11/30/2010  . Stroke (HCC) 10/2008  . 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 EYE SURGERY    . PARTIAL HYSTERECTOMY  1968    Allergies as of 12/18/2016      Reactions   Sulfa Antibiotics    Cant remember reaction      Medication List       Accurate as of 12/18/16  2:47 PM. Always use your most recent med list.          amLODipine 10 MG tablet Commonly known as:  NORVASC TAKE 1 TABLET AT BEDTIME   aspirin 81 MG chewable tablet Chew 81 mg by mouth daily.   atenolol 50 MG tablet Commonly known as:  TENORMIN TAKE 1 TABLET TWICE A DAY   atorvastatin 40 MG tablet Commonly known as:  LIPITOR TAKE 1 TABLET DAILY   cholecalciferol 1000 units tablet Commonly known as:  VITAMIN Black Take 1,000 Units by mouth daily.   docusate sodium 100 MG capsule Commonly known as:  COLACE Take 100 mg by mouth 2 (two) times daily.   escitalopram 10 MG tablet Commonly known as:  LEXAPRO Take 10 mg by mouth daily.   ferrous sulfate 325 (65 FE) MG tablet Take 325 mg by mouth daily with breakfast.   fexofenadine 180 MG tablet Commonly known as:  ALLEGRA Take 180 mg by mouth daily.   LORazepam 0.5 MG tablet Commonly known as:  ATIVAN Take 1 tablet (0.5 mg total) by mouth every 8 (eight) hours.   memantine 10 MG tablet Commonly known as:  NAMENDA Take 10 mg by mouth 2 (two) times daily.   mirtazapine 7.5 MG tablet Commonly known as:  REMERON Take 7.5 mg by mouth. Take one tablet at bedtime   oxyCODONE 5  MG immediate release tablet Commonly known as:  Oxy IR/ROXICODONE Take 1 tablet (5 mg total) by mouth every 6 (six) hours as needed for severe pain.   pantoprazole 40 MG tablet Commonly known as:  PROTONIX Take 1 tablet (40 mg total) by mouth 2 (two) times daily.   polyethylene glycol packet Commonly known as:  MIRALAX / GLYCOLAX Take 17 g by mouth daily. To be hold for diarrhea   Potassium Chloride ER 20 MEQ Tbcr Take 1 tablet by mouth daily.   sennosides-docusate sodium 8.6-50 MG tablet Commonly known as:  SENOKOT-S Take 1 tablet by mouth 2 (two) times daily.   TAB-A-VITE Tabs Take 1 tablet by mouth daily.   traMADol 50 MG tablet Commonly known as:  ULTRAM Give 1 tablet by mouth  every 8 hours scheduled for pain   vitamin C 500 MG tablet Commonly known as:  ASCORBIC ACID Take 500 mg by mouth 2 (two) times daily.   ZETIA 10 MG tablet Generic drug:  ezetimibe TAKE 1 TABLET DAILY       No orders of the defined types were placed in this encounter.   Immunization History  Administered Date(s) Administered  . Influenza,inj,Quad PF,36+ Mos 04/16/2013, 05/06/2014  . Influenza-Unspecified 02/03/2015, 02/08/2016  . PPD Test 10/14/2014    Social History  Substance Use Topics  . Smoking status: Former Smoker    Packs/day: 0.50    Years: 50.00  . Smokeless tobacco: Never Used  . Alcohol use No    Review of Systems  DATA OBTAINED: from patient, nurse GENERAL:  no fevers, fatigue, appetite changes SKIN: No itching, rash HEENT: No complaint RESPIRATORY: + cough, no wheezing, no SOB CARDIAC: No chest pain, palpitations, lower extremity edema  GI: No abdominal pain, No N/V/Black or constipation, No heartburn or reflux  GU: No dysuria, frequency or urgency, or incontinence  MUSCULOSKELETAL: No unrelieved bone/joint pain NEUROLOGIC: No headache, dizziness  PSYCHIATRIC: No overt anxiety or sadness  Vitals:   12/18/16 1443  BP: 135/72  Pulse: 72  Resp: 19  Temp: (!) 97.5 F (36.4 C)   Body mass index is 24.72 kg/m. Physical Exam  GENERAL APPEARANCE: Alert, conversant, No acute distress; In bed  SKIN: No diaphoresis rash HEENT: Unremarkable RESPIRATORY: Breathing is even, unlabored. Lung sounds are clear, no wheezing, no rails   CARDIOVASCULAR: Heart RRR no murmurs, rubs or gallops. No peripheral edema  GASTROINTESTINAL: Abdomen is soft, non-tender, not distended w/ normal bowel sounds.  GENITOURINARY: Bladder non tender, not distended  MUSCULOSKELETAL: No abnormal joints or musculature NEUROLOGIC: Cranial nerves 2-12 grossly intact. Moves all extremities PSYCHIATRIC: Mood and affect appropriate to situation, no behavioral issues  Patient Active  Problem List   Diagnosis Date Noted  . Osteoporosis 03/18/2016  . Depression 12/23/2015  . Chronic diastolic heart failure (HCC) 09/05/2015  . Right leg pain 08/05/2015  . COPD (chronic obstructive pulmonary disease) (HCC) 06/25/2015  . Loss of weight 01/21/2015  . Anemia, iron deficiency 01/05/2015  . Acute upper respiratory infection 01/05/2015  . Iron deficiency anemia 12/10/2014  . Stomach discomfort 11/30/2014  . Subacute confusional state 11/30/2014  . Acute respiratory failure with hypoxia (HCC) 11/11/2014  . AKI (acute kidney injury) (HCC) 11/11/2014  . Hypernatremia 11/11/2014  . Hypokalemia 11/09/2014  . COPD exacerbation (HCC)   . Acute on chronic diastolic heart failure (HCC)   . Hypoxia   . SOB (shortness of breath)   . Pressure ulcer stage II 11/02/2014  . Palliative care encounter 11/02/2014  .  HCAP (healthcare-associated pneumonia) 11/01/2014  . Leukocytosis 10/15/2014  . Closed fracture of part of upper end of humerus 10/14/2014  . Poor balance 11/25/2013  . History of stroke 11/25/2013  . 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 Myalgia 09/05/2012  . Disequilibrium 09/05/2012  . Hypertension, essential 09/05/2012  . 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 Mobility impaired 07/15/2012  . Physical exam, routine 12/07/2011  . Cerumen impaction 04/04/2011  . Trapezius muscle spasm 04/04/2011  . Hypertensive heart disease with CHF (congestive heart failure) (HCC) 11/30/2010  . Hyperlipidemia 11/30/2010  . CVA (cerebral infarction) 11/30/2010  . Postmenopausal HRT (hormone replacement therapy) 11/30/2010  . Anxiety 11/30/2010  . Ankle weakness 11/30/2010    CMP     Component Value Date/Time   NA 139 09/28/2016   K 4.4 09/28/2016   CL 102 10/13/2015 1505   CO2 22 10/13/2015 1505   GLUCOSE 118 (H) 10/13/2015 1505   BUN 21 09/28/2016   CREATININE 0.7 09/28/2016   CREATININE 0.79 10/13/2015 1505   CALCIUM 9.1 10/13/2015 1505   PROT 6.3 10/13/2015  1505   ALBUMIN 3.8 10/13/2015 1505   ALBUMIN 3.8 10/13/2015 1505   AST 17 09/28/2016   ALT 16 09/28/2016   ALKPHOS 90 09/28/2016   BILITOT 0.6 10/13/2015 1505   GFRNONAA >60 11/05/2014 0225   GFRAA >60 11/05/2014 0225    Recent Labs  03/01/16 09/28/16  NA 138 139  K 4.3 4.4  BUN 28* 21  CREATININE 0.6 0.7    Recent Labs  03/01/16 09/28/16  AST  --  17  ALT  --  16  ALKPHOS 88 90    Recent Labs  02/29/16 09/28/16  WBC 7.6 6.6  HGB 13.0 12.7  HCT 39 39  PLT 182 167    Recent Labs  03/01/16 09/28/16  CHOL 160 140  LDLCALC 58 77  TRIG 318* 144   No results found for: MICROALBUR Lab Results  Component Value Date   TSH 4.61 09/28/2016   Lab Results  Component Value Date   HGBA1C 5.6 09/28/2016   Lab Results  Component Value Date   CHOL 140 09/28/2016   HDL 35 09/28/2016   LDLCALC 77 09/28/2016   LDLDIRECT 190.1 05/06/2014   TRIG 144 09/28/2016   CHOLHDL 7 05/06/2014    Significant Diagnostic Results in last 30 days:  No results found.  Assessment and Plan  OXYGEN DESATURATION/ cough - have ordered a BMP and CBC with a chest x-ray PA and lateral; has started 3 times a day Mucinex for 7 days, patient has had no weight gain and has no edema therefore don't think it is heart failure; we'll continue to monitor and treat depending on x-ray results or any new symptoms  Late entry-chest x-ray returned showing no pneumonia and no heart failure; will continue symptomatically but for now    [name]. [name], MD

## 2016-12-21 ENCOUNTER — Encounter: Payer: Self-pay | Admitting: Internal Medicine

## 2016-12-21 NOTE — Assessment & Plan Note (Signed)
Stable;; continue Lexapro 10 mg by mouth daily and Remeron 7.5 mg by mouth daily at bedtime

## 2016-12-21 NOTE — Assessment & Plan Note (Signed)
Appears well controlled; continue Ativan 0.5 mg every 8 scheduled

## 2016-12-21 NOTE — Assessment & Plan Note (Signed)
Last hemoglobin 12.7, which is stable from prior; plan to continue iron 325 mg by mouth daily

## 2016-12-23 ENCOUNTER — Encounter: Payer: Self-pay | Admitting: Internal Medicine

## 2016-12-25 ENCOUNTER — Non-Acute Institutional Stay (SKILLED_NURSING_FACILITY): Payer: Medicare Other | Admitting: Internal Medicine

## 2016-12-25 ENCOUNTER — Encounter: Payer: Self-pay | Admitting: Internal Medicine

## 2016-12-25 DIAGNOSIS — J42 Unspecified chronic bronchitis: Secondary | ICD-10-CM | POA: Diagnosis not present

## 2016-12-25 DIAGNOSIS — I110 Hypertensive heart disease with heart failure: Secondary | ICD-10-CM

## 2016-12-25 DIAGNOSIS — I5032 Chronic diastolic (congestive) heart failure: Secondary | ICD-10-CM | POA: Diagnosis not present

## 2016-12-25 NOTE — Progress Notes (Signed)
Location:  [facility] Room Number: 411P Place of Service:  SNF ([id number])  [name], MD  Patient Care Team: [name], MD as PCP - General (Internal Medicine)  Extended Emergency Contact Information Primary Emergency Contact: [name],[name] Address: [address]          [name] [address] [name] Home Phone: [phone] Relation: Daughter Secondary Emergency Contact: [name] [place] Home Phone: [phone] Relation: Daughter    Allergies: Sulfa antibiotics  Chief Complaint  Patient presents with  . Medical Management of Chronic Issues    Routine Visit    HPI: Patient is 83 y.o. female who Is being seen for routine issues of chronic diastolic congestive heart failure, hypertension, and COPD.  Past Medical History:  Diagnosis Date  . Acute on chronic diastolic heart failure (HCC)   . Aneurysm (HCC)    of the eye  . Anxiety 11/30/2010  . Closed fracture of part of upper end of humerus 10/14/2014  . COPD exacerbation (HCC)   . CVA (cerebral infarction) 11/30/2010  . Decubitus ulcer of sacral region, stage 4 (HCC)   . 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 Depression 12/23/2015  . History of stroke 11/25/2013  . Hyperlipidemia   . Hypertension   . Hypertensive heart disease with CHF (congestive heart failure) (HCC) 11/30/2010  . Iron deficiency anemia 12/10/2014  . Leukocytosis 10/15/2014  . Osteoporosis   . Postmenopausal HRT (hormone replacement therapy) 11/30/2010  . Stroke (HCC) 10/2008  . 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 EYE SURGERY    . PARTIAL HYSTERECTOMY  1968    Allergies as of 12/25/2016      Reactions   Sulfa Antibiotics    Cant remember reaction      Medication List       Accurate as of 12/25/16 11:59 PM. Always use your most recent med  list.          amLODipine 10 MG tablet Commonly known as:  NORVASC TAKE 1 TABLET AT BEDTIME   aspirin 81 MG chewable tablet Chew 81 mg by mouth daily.   atenolol 50 MG tablet Commonly known as:  TENORMIN TAKE 1 TABLET TWICE A DAY   atorvastatin 40 MG tablet Commonly known as:  LIPITOR TAKE 1 TABLET DAILY   cholecalciferol 1000 units tablet Commonly known as:  VITAMIN D Take 1,000 Units by mouth daily.   docusate sodium 100 MG capsule Commonly known as:  COLACE Take 100 mg by mouth 2 (two) times daily.   escitalopram 10 MG tablet Commonly known as:  LEXAPRO Take 10 mg by mouth daily.   ferrous sulfate 325 (65 FE) MG tablet Take 325 mg by mouth daily with breakfast.   fexofenadine 180 MG tablet Commonly known as:  ALLEGRA Take 180 mg by mouth daily.   LORazepam 0.5 MG tablet Commonly known as:  ATIVAN Take 1 tablet (0.5 mg total) by mouth every 8 (eight) hours.   memantine 10 MG tablet Commonly known as:  NAMENDA Take 10 mg by mouth 2 (two) times daily.   mirtazapine 7.5 MG tablet Commonly known as:  REMERON Take 7.5 mg by mouth. Take one tablet at bedtime   oxyCODONE 5 MG immediate release tablet Commonly known as:  Oxy IR/ROXICODONE Take 1 tablet (5 mg total) by mouth every 6 (six) hours  as needed for severe pain.   pantoprazole 40 MG tablet Commonly known as:  PROTONIX Take 1 tablet (40 mg total) by mouth 2 (two) times daily.   polyethylene glycol packet Commonly known as:  MIRALAX / GLYCOLAX Take 17 g by mouth daily. To be hold for diarrhea   Potassium Chloride ER 20 MEQ Tbcr Take 1 tablet by mouth daily.   sennosides-docusate sodium 8.6-50 MG tablet Commonly known as:  SENOKOT-S Take 1 tablet by mouth 2 (two) times daily.   TAB-A-VITE Tabs Take 1 tablet by mouth daily.   traMADol 50 MG tablet Commonly known as:  ULTRAM Give 1 tablet by mouth every 8 hours scheduled for pain   vitamin C 500 MG tablet Commonly known as:  ASCORBIC  ACID Take 500 mg by mouth 2 (two) times daily.   ZETIA 10 MG tablet Generic drug:  ezetimibe TAKE 1 TABLET DAILY       No orders of the defined types were placed in this encounter.   Immunization History  Administered Date(s) Administered  . Influenza,inj,Quad PF,6+ Mos 04/16/2013, 05/06/2014  . Influenza-Unspecified 02/03/2015, 02/08/2016  . PPD Test 10/14/2014    Social History  Substance Use Topics  . Smoking status: Former Smoker    Packs/day: 0.50    Years: 50.00  . Smokeless tobacco: Never Used  . Alcohol use No    Review of Systems  DATA OBTAINED: from patient, nurse GENERAL:  no fevers, fatigue, appetite changes SKIN: No itching, rash HEENT: No complaint RESPIRATORY: No cough, wheezing, SOB CARDIAC: No chest pain, palpitations, lower extremity edema  GI: No abdominal pain, No N/V/D or constipation, No heartburn or reflux  GU: No dysuria, frequency or urgency, or incontinence  MUSCULOSKELETAL: No unrelieved bone/joint pain NEUROLOGIC: No headache, dizziness  PSYCHIATRIC: No overt anxiety or sadness  Vitals:   12/25/16 0935  BP: 135/72  Pulse: 63  Resp: 18  Temp: (!) 97.5 F (36.4 C)   Body mass index is 24.65 kg/m. Physical Exam  GENERAL APPEARANCE: Alert, conversant, No acute distress  SKIN: No diaphoresis rash HEENT: Unremarkable RESPIRATORY: Breathing is even, unlabored. Lung sounds are clear   CARDIOVASCULAR: Heart RRR no murmurs, rubs or gallops. No peripheral edema  GASTROINTESTINAL: Abdomen is soft, non-tender, not distended w/ normal bowel sounds.  GENITOURINARY: Bladder non tender, not distended  MUSCULOSKELETAL: No abnormal joints or musculature NEUROLOGIC: Cranial nerves 2-12 grossly intact. Moves all extremities PSYCHIATRIC: Mood and affect appropriate With dementia, no behavioral issues  Patient Active Problem List   Diagnosis Date Noted  . Osteoporosis 03/18/2016  . Depression 12/23/2015  . Chronic diastolic heart failure  (HCC) 16/02/9603  . Right leg pain 08/05/2015  . COPD (chronic obstructive pulmonary disease) (HCC) 06/25/2015  . Loss of weight 01/21/2015  . Anemia, iron deficiency 01/05/2015  . Acute upper respiratory infection 01/05/2015  . Iron deficiency anemia 12/10/2014  . Stomach discomfort 11/30/2014  . Subacute confusional state 11/30/2014  . Acute respiratory failure with hypoxia (HCC) 11/11/2014  . AKI (acute kidney injury) (HCC) 11/11/2014  . Hypernatremia 11/11/2014  . Hypokalemia 11/09/2014  . COPD exacerbation (HCC)   . Acute on chronic diastolic heart failure (HCC)   . Hypoxia   . SOB (shortness of breath)   . Pressure ulcer stage II 11/02/2014  . Palliative care encounter 11/02/2014  . HCAP (healthcare-associated pneumonia) 11/01/2014  . Leukocytosis 10/15/2014  . Closed fracture of part of upper end of humerus 10/14/2014  . Poor balance 11/25/2013  . History of stroke  11/25/2013  . 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 Myalgia 09/05/2012  . Disequilibrium 09/05/2012  . Hypertension, essential 09/05/2012  . 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 Mobility impaired 07/15/2012  . Physical exam, routine 12/07/2011  . Cerumen impaction 04/04/2011  . Trapezius muscle spasm 04/04/2011  . Hypertensive heart disease with CHF (congestive heart failure) (HCC) 11/30/2010  . Hyperlipidemia 11/30/2010  . CVA (cerebral infarction) 11/30/2010  . Postmenopausal HRT (hormone replacement therapy) 11/30/2010  . Anxiety 11/30/2010  . Ankle weakness 11/30/2010    CMP     Component Value Date/Time   NA 139 09/28/2016   K 4.4 09/28/2016   CL 102 10/13/2015 1505   CO2 22 10/13/2015 1505   GLUCOSE 118 (H) 10/13/2015 1505   BUN 21 09/28/2016   CREATININE 0.7 09/28/2016   CREATININE 0.79 10/13/2015 1505   CALCIUM 9.1 10/13/2015 1505   PROT 6.3 10/13/2015 1505   ALBUMIN 3.8 10/13/2015 1505   ALBUMIN 3.8 10/13/2015 1505   AST 17 09/28/2016   ALT 16 09/28/2016   ALKPHOS 90  09/28/2016   BILITOT 0.6 10/13/2015 1505   GFRNONAA >60 11/05/2014 0225   GFRAA >60 11/05/2014 0225    Recent Labs  03/01/16 09/28/16  NA 138 139  K 4.3 4.4  BUN 28* 21  CREATININE 0.6 0.7    Recent Labs  03/01/16 09/28/16  AST  --  17  ALT  --  16  ALKPHOS 88 90    Recent Labs  02/29/16 09/28/16  WBC 7.6 6.6  HGB 13.0 12.7  HCT 39 39  PLT 182 167    Recent Labs  03/01/16 09/28/16  CHOL 160 140  LDLCALC 58 77  TRIG 318* 144   No results found for: MICROALBUR Lab Results  Component Value Date   TSH 4.61 09/28/2016   Lab Results  Component Value Date   HGBA1C 5.6 09/28/2016   Lab Results  Component Value Date   CHOL 140 09/28/2016   HDL 35 09/28/2016   LDLCALC 77 09/28/2016   LDLDIRECT 190.1 05/06/2014   TRIG 144 09/28/2016   CHOLHDL 7 05/06/2014    Significant Diagnostic Results in last 30 days:  No results found.  Assessment and Plan  Chronic diastolic heart failure (HCC) No reported exacerbations; plan to continue Tenormin 50 mg by mouth twice a day; patient is not on diuretic, however patient is being weighed regularly in the edema would trigger a response to me the patient perhaps needs to be on diuretics; will continue to monitor  Hypertensive heart disease with CHF (congestive heart failure) (HCC) Controlled; continue Norvasc 10 mg by mouth daily, Tenormin 50 mg by mouth twice a day  COPD (chronic obstructive pulmonary disease) (HCC) No reported desaturation; patient on no meds except for her Allegra 180 mg daily which will continue    Eligh Rybacki D. [name], MD

## 2017-01-20 ENCOUNTER — Encounter: Payer: Self-pay | Admitting: Internal Medicine

## 2017-01-20 NOTE — Assessment & Plan Note (Signed)
No reported exacerbations; plan to continue Tenormin 50 mg by mouth twice a day; patient is not on diuretic, however patient is being weighed regularly in the edema would trigger a response to me the patient perhaps needs to be on diuretics; will continue to monitor

## 2017-01-20 NOTE — Assessment & Plan Note (Signed)
Controlled; continue Norvasc 10 mg by mouth daily, Tenormin 50 mg by mouth twice a day

## 2017-01-20 NOTE — Assessment & Plan Note (Signed)
No reported desaturation; patient on no meds except for her Allegra 180 mg daily which will continue

## 2017-01-22 ENCOUNTER — Non-Acute Institutional Stay (SKILLED_NURSING_FACILITY): Payer: Medicare Other | Admitting: Internal Medicine

## 2017-01-22 ENCOUNTER — Encounter: Payer: Self-pay | Admitting: Internal Medicine

## 2017-01-22 DIAGNOSIS — F0280 Dementia in other diseases classified elsewhere without behavioral disturbance: Secondary | ICD-10-CM

## 2017-01-22 DIAGNOSIS — G20 Parkinson's disease: Secondary | ICD-10-CM | POA: Diagnosis not present

## 2017-01-22 DIAGNOSIS — M818 Other osteoporosis without current pathological fracture: Secondary | ICD-10-CM | POA: Diagnosis not present

## 2017-01-22 DIAGNOSIS — D508 Other iron deficiency anemias: Secondary | ICD-10-CM | POA: Diagnosis not present

## 2017-01-22 NOTE — Progress Notes (Signed)
Location:  [facility] Room Number: [phone] Place of Service:  SNF [phone])  [name], MD  Patient Care Team: [name], MD as PCP - General (Internal Medicine)  Extended Emergency Contact Information Primary Emergency Contact: [name],[name] Address: [address]          [name] [address] [name] Home Phone: [phone] Relation: Daughter Secondary Emergency Contact: [name] [place] Home Phone: [phone] Relation: Daughter    Allergies: Sulfa antibiotics  Chief Complaint  Patient presents with  . Medical Management of Chronic Issues    routine visit    HPI: Patient is 83 y.o. female who Is being seen for routine issues of dementia, osteoporosis, and iron deficiency anemia.  Past Medical History:  Diagnosis Date  . Acute on chronic diastolic heart failure (HCC)   . Aneurysm (HCC)    of the eye  . Anxiety 11/30/2010  . Closed fracture of part of upper end of humerus 10/14/2014  . COPD exacerbation (HCC)   . CVA (cerebral infarction) 11/30/2010  . Decubitus ulcer of sacral region, stage 4 (HCC)   . 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 Depression 12/23/2015  . History of stroke 11/25/2013  . Hyperlipidemia   . Hypertension   . Hypertensive heart disease with CHF (congestive heart failure) (HCC) 11/30/2010  . Iron deficiency anemia 12/10/2014  . Leukocytosis 10/15/2014  . Osteoporosis   . Postmenopausal HRT (hormone replacement therapy) 11/30/2010  . Stroke (HCC) 10/2008  . 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 EYE SURGERY    . PARTIAL HYSTERECTOMY  1968    Allergies as of 01/22/2017      Reactions   Sulfa Antibiotics    Cant remember reaction      Medication List       Accurate as of 01/22/17 11:59 PM. Always use your most recent med list.          amLODipine 10 MG tablet Commonly known as:  NORVASC TAKE 1 TABLET AT BEDTIME   aspirin 81 MG chewable tablet Chew 81 mg by mouth daily.   atenolol 50 MG tablet Commonly known as:  TENORMIN TAKE 1 TABLET TWICE A DAY   atorvastatin 40 MG tablet Commonly known as:  LIPITOR TAKE 1 TABLET DAILY   cholecalciferol 1000 units tablet Commonly known as:  VITAMIN D Take 1,000 Units by mouth daily.   docusate sodium 100 MG capsule Commonly known as:  COLACE Take 100 mg by mouth 2 (two) times daily.   escitalopram 10 MG tablet Commonly known as:  LEXAPRO Take 10 mg by mouth daily.   ferrous sulfate 325 (65 FE) MG tablet Take 325 mg by mouth daily with breakfast.   fexofenadine 180 MG tablet Commonly known as:  ALLEGRA Take 180 mg by mouth daily.   ipratropium-albuterol 0.5-2.5 (3) MG/3ML Soln Commonly known as:  DUONEB Take 3 mLs by nebulization. Inhale 1 vial via nebulizer every 6 hours as needed for wheezing/SOB   LORazepam 0.5 MG tablet Commonly known as:  ATIVAN Take 1 tablet (0.5 mg total) by mouth every 8 (eight) hours.   memantine 10 MG tablet Commonly known as:  NAMENDA Take 10 mg by mouth 2 (two) times daily.   mirtazapine 7.5 MG tablet Commonly known as:  REMERON Take 7.5 mg by mouth. Take one tablet at bedtime  oxyCODONE 5 MG immediate release tablet Commonly known as:  Oxy IR/ROXICODONE Take 1 tablet (5 mg total) by mouth every 6 (six) hours as needed for severe pain.   pantoprazole 40 MG tablet Commonly known as:  PROTONIX Take 1 tablet (40 mg total) by mouth 2 (two) times daily.   polyethylene glycol packet Commonly known as:  MIRALAX / GLYCOLAX Take 17 g by mouth daily. To be hold for diarrhea   Potassium Chloride ER 20 MEQ Tbcr Take 1 tablet by mouth daily.   sennosides-docusate sodium 8.6-50 MG tablet Commonly known as:  SENOKOT-S Take 1 tablet by mouth 2 (two) times daily.   TAB-A-VITE Tabs Take 1 tablet by mouth daily.   traMADol 50 MG  tablet Commonly known as:  ULTRAM Give 1 tablet by mouth every 8 hours scheduled for pain   vitamin C 500 MG tablet Commonly known as:  ASCORBIC ACID Take 500 mg by mouth 2 (two) times daily.   ZETIA 10 MG tablet Generic drug:  ezetimibe TAKE 1 TABLET DAILY       Meds ordered this encounter  Medications  . ipratropium-albuterol (DUONEB) 0.5-2.5 (3) MG/3ML SOLN    Sig: Take 3 mLs by nebulization. Inhale 1 vial via nebulizer every 6 hours as needed for wheezing/SOB    Immunization History  Administered Date(s) Administered  . Influenza,inj,Quad PF,6+ Mos 04/16/2013, 05/06/2014  . Influenza-Unspecified 02/03/2015, 02/08/2016  . PPD Test 10/14/2014    Social History  Substance Use Topics  . Smoking status: Former Smoker    Packs/day: 0.50    Years: 50.00  . Smokeless tobacco: Never Used  . Alcohol use No    Review of Systems  DATA OBTAINED: from patient, nurse GENERAL:  no fevers, fatigue, appetite changes SKIN: No itching, rash HEENT: No complaint RESPIRATORY: No cough, wheezing, SOB CARDIAC: No chest pain, palpitations, lower extremity edema  GI: No abdominal pain, No N/V/D or constipation, No heartburn or reflux  GU: No dysuria, frequency or urgency, or incontinence  MUSCULOSKELETAL: No unrelieved bone/joint pain NEUROLOGIC: No headache, dizziness  PSYCHIATRIC: No overt anxiety or sadness  Vitals:   01/22/17 1504  BP: 135/72  Pulse: 70  Resp: 18  Temp: (!) 97.5 F (36.4 C)   Body mass index is 24.03 kg/m. Physical Exam  GENERAL APPEARANCE: Alert, conversant, No acute distress  SKIN: No diaphoresis rash HEENT: Unremarkable RESPIRATORY: Breathing is even, unlabored. Lung sounds are clear   CARDIOVASCULAR: Heart RRR no murmurs, rubs or gallops. No peripheral edema  GASTROINTESTINAL: Abdomen is soft, non-tender, not distended w/ normal bowel sounds.  GENITOURINARY: Bladder non tender, not distended  MUSCULOSKELETAL: No abnormal joints or  musculature NEUROLOGIC: Cranial nerves 2-12 grossly intact. Moves all extremities PSYCHIATRIC: Mood and affect With dementia, no behavioral issues  Patient Active Problem List   Diagnosis Date Noted  . Osteoporosis 03/18/2016  . Depression 12/23/2015  . Chronic diastolic heart failure (HCC) 09/05/2015  . Right leg pain 08/05/2015  . COPD (chronic obstructive pulmonary disease) (HCC) 06/25/2015  . Loss of weight 01/21/2015  . Anemia, iron deficiency 01/05/2015  . Acute upper respiratory infection 01/05/2015  . Iron deficiency anemia 12/10/2014  . Stomach discomfort 11/30/2014  . Subacute confusional state 11/30/2014  . Acute respiratory failure with hypoxia (HCC) 11/11/2014  . AKI (acute kidney injury) (HCC) 11/11/2014  . Hypernatremia 11/11/2014  . Hypokalemia 11/09/2014  . COPD exacerbation (HCC)   . Acute on chronic diastolic heart failure (HCC)   . Hypoxia   . SOB (shortness  of breath)   . Pressure ulcer stage II 11/02/2014  . Palliative care encounter 11/02/2014  . HCAP (healthcare-associated pneumonia) 11/01/2014  . Leukocytosis 10/15/2014  . Closed fracture of part of upper end of humerus 10/14/2014  . Poor balance 11/25/2013  . History of stroke 11/25/2013  . 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 Myalgia 09/05/2012  . Disequilibrium 09/05/2012  . Hypertension, essential 09/05/2012  . 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 Mobility impaired 07/15/2012  . Physical exam, routine 12/07/2011  . Cerumen impaction 04/04/2011  . Trapezius muscle spasm 04/04/2011  . Hypertensive heart disease with CHF (congestive heart failure) (HCC) 11/30/2010  . Hyperlipidemia 11/30/2010  . CVA (cerebral infarction) 11/30/2010  . Postmenopausal HRT (hormone replacement therapy) 11/30/2010  . Anxiety 11/30/2010  . Ankle weakness 11/30/2010    CMP     Component Value Date/Time   NA 139 09/28/2016   K 4.4 09/28/2016   CL 102 10/13/2015 1505   CO2 22 10/13/2015 1505    GLUCOSE 118 (H) 10/13/2015 1505   BUN 21 09/28/2016   CREATININE 0.7 09/28/2016   CREATININE 0.79 10/13/2015 1505   CALCIUM 9.1 10/13/2015 1505   PROT 6.3 10/13/2015 1505   ALBUMIN 3.8 10/13/2015 1505   ALBUMIN 3.8 10/13/2015 1505   AST 17 09/28/2016   ALT 16 09/28/2016   ALKPHOS 90 09/28/2016   BILITOT 0.6 10/13/2015 1505   GFRNONAA >60 11/05/2014 0225   GFRAA >60 11/05/2014 0225    Recent Labs  03/01/16 09/28/16  NA 138 139  K 4.3 4.4  BUN 28* 21  CREATININE 0.6 0.7    Recent Labs  03/01/16 09/28/16  AST  --  17  ALT  --  16  ALKPHOS 88 90    Recent Labs  02/29/16 09/28/16  WBC 7.6 6.6  HGB 13.0 12.7  HCT 39 39  PLT 182 167    Recent Labs  03/01/16 09/28/16  CHOL 160 140  LDLCALC 58 77  TRIG 318* 144   No results found for: MICROALBUR Lab Results  Component Value Date   TSH 4.61 09/28/2016   Lab Results  Component Value Date   HGBA1C 5.6 09/28/2016   Lab Results  Component Value Date   CHOL 140 09/28/2016   HDL 35 09/28/2016   LDLCALC 77 09/28/2016   LDLDIRECT 190.1 05/06/2014   TRIG 144 09/28/2016   CHOLHDL 7 05/06/2014    Significant Diagnostic Results in last 30 days:  No results found.  Assessment and Plan  Dementia due to Parkinson's disease without behavioral disturbance (HCC) Chronic and stable; continue Namenda 10 mg by mouth twice a day  Osteoporosis No reported falls or fracture; plan to continue vitamin D 1000 units daily  Anemia, iron deficiency Hemoglobin is stable; plan to continue iron 325 mg by mouth daily     Anne D. [name], MD

## 2017-02-18 ENCOUNTER — Encounter: Payer: Self-pay | Admitting: Internal Medicine

## 2017-02-18 NOTE — Assessment & Plan Note (Signed)
No reported falls or fracture; plan to continue vitamin D 1000 units daily

## 2017-02-18 NOTE — Assessment & Plan Note (Signed)
Hemoglobin is stable; plan to continue iron 325 mg by mouth daily

## 2017-02-18 NOTE — Assessment & Plan Note (Signed)
Chronic and stable; continue Namenda 10 mg by mouth twice a day

## 2017-02-19 ENCOUNTER — Encounter: Payer: Self-pay | Admitting: Internal Medicine

## 2017-02-19 ENCOUNTER — Non-Acute Institutional Stay (SKILLED_NURSING_FACILITY): Payer: Medicare Other | Admitting: Internal Medicine

## 2017-02-19 DIAGNOSIS — F329 Major depressive disorder, single episode, unspecified: Secondary | ICD-10-CM | POA: Diagnosis not present

## 2017-02-19 DIAGNOSIS — D508 Other iron deficiency anemias: Secondary | ICD-10-CM

## 2017-02-19 DIAGNOSIS — F419 Anxiety disorder, unspecified: Secondary | ICD-10-CM

## 2017-02-19 DIAGNOSIS — F32A Depression, unspecified: Secondary | ICD-10-CM

## 2017-02-19 NOTE — Progress Notes (Signed)
Location:  [facility] Room Number: 411 Place of Service:  SNF [phone])  [name], MD  Patient Care Team: [name], MD as PCP - General (Internal Medicine)  Extended Emergency Contact Information Primary Emergency Contact: [name],[name] Address: [address]          [name] [address] [name] Home Phone: [phone] Relation: Daughter Secondary Emergency Contact: [name] [place] Home Phone: [phone] Relation: Daughter    Allergies: Sulfa antibiotics  Chief Complaint  Patient presents with  . Medical Management of Chronic Issues    routine visit    HPI: Patient is 83 y.o. female who Is being seen for routine issues of anxiety, depression, and iron deficiency anemia.  Past Medical History:  Diagnosis Date  . Acute on chronic diastolic heart failure (HCC)   . Aneurysm (HCC)    of the eye  . Anxiety 11/30/2010  . Closed fracture of part of upper end of humerus 10/14/2014  . COPD exacerbation (HCC)   . CVA (cerebral infarction) 11/30/2010  . Decubitus ulcer of sacral region, stage 4 (HCC)   . 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 Depression 12/23/2015  . History of stroke 11/25/2013  . Hyperlipidemia   . Hypertension   . Hypertensive heart disease with CHF (congestive heart failure) (HCC) 11/30/2010  . Iron deficiency anemia 12/10/2014  . Leukocytosis 10/15/2014  . Osteoporosis   . Postmenopausal HRT (hormone replacement therapy) 11/30/2010  . Stroke (HCC) 10/2008  . 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 EYE SURGERY    . PARTIAL HYSTERECTOMY  1968    Allergies as of 02/19/2017      Reactions   Sulfa Antibiotics    Cant remember reaction      Medication List       Accurate as of 02/19/17  7:46 PM. Always use your most recent med list.            amLODipine 10 MG tablet Commonly known as:  NORVASC TAKE 1 TABLET AT BEDTIME   aspirin 81 MG chewable tablet Chew 81 mg by mouth daily.   atenolol 50 MG tablet Commonly known as:  TENORMIN TAKE 1 TABLET TWICE A DAY   atorvastatin 40 MG tablet Commonly known as:  LIPITOR TAKE 1 TABLET DAILY   cholecalciferol 1000 units tablet Commonly known as:  VITAMIN D Take 1,000 Units by mouth daily.   docusate sodium 100 MG capsule Commonly known as:  COLACE Take 100 mg by mouth 2 (two) times daily.   escitalopram 10 MG tablet Commonly known as:  LEXAPRO Take 10 mg by mouth daily.   ferrous sulfate 325 (65 FE) MG tablet Take 325 mg by mouth daily with breakfast.   fexofenadine 180 MG tablet Commonly known as:  ALLEGRA Take 180 mg by mouth daily.   ipratropium-albuterol 0.5-2.5 (3) MG/3ML Soln Commonly known as:  DUONEB Take 3 mLs by nebulization. Inhale 1 vial via nebulizer every 6 hours as needed for wheezing/SOB   LORazepam 0.5 MG tablet Commonly known as:  ATIVAN Take 1 tablet (0.5 mg total) by mouth every 8 (eight) hours.   memantine 10 MG tablet Commonly known as:  NAMENDA Take 10 mg by mouth 2 (two) times daily.   mirtazapine 7.5 MG tablet Commonly known as:  REMERON Take 7.5 mg by mouth. Take one  tablet at bedtime   oxyCODONE 5 MG immediate release tablet Commonly known as:  Oxy IR/ROXICODONE Take 1 tablet (5 mg total) by mouth every 6 (six) hours as needed for severe pain.   pantoprazole 40 MG tablet Commonly known as:  PROTONIX Take 1 tablet (40 mg total) by mouth 2 (two) times daily.   polyethylene glycol packet Commonly known as:  MIRALAX / GLYCOLAX Take 17 g by mouth daily. To be hold for diarrhea   Potassium Chloride ER 20 MEQ Tbcr Take 1 tablet by mouth daily.   sennosides-docusate sodium 8.6-50 MG tablet Commonly known as:  SENOKOT-S Take 1 tablet by mouth 2 (two) times daily.   TAB-A-VITE Tabs Take 1 tablet by mouth daily.   traMADol 50 MG  tablet Commonly known as:  ULTRAM Give 1 tablet by mouth every 8 hours scheduled for pain   vitamin C 500 MG tablet Commonly known as:  ASCORBIC ACID Take 500 mg by mouth 2 (two) times daily.   ZETIA 10 MG tablet Generic drug:  ezetimibe TAKE 1 TABLET DAILY       No orders of the defined types were placed in this encounter.   Immunization History  Administered Date(s) Administered  . Influenza,inj,Quad PF,6+ Mos 04/16/2013, 05/06/2014  . Influenza-Unspecified 02/03/2015, 02/08/2016  . PPD Test 10/14/2014    Social History  Substance Use Topics  . Smoking status: Former Smoker    Packs/day: 0.50    Years: 50.00  . Smokeless tobacco: Never Used  . Alcohol use No    Review of Systems  DATA OBTAINED: from patient, nurse GENERAL:  no fevers, fatigue, appetite changes SKIN: No itching, rash HEENT: No complaint RESPIRATORY: No cough, wheezing, SOB CARDIAC: No chest pain, palpitations, lower extremity edema  GI: No abdominal pain, No N/V/D or constipation, No heartburn or reflux  GU: No dysuria, frequency or urgency, or incontinence  MUSCULOSKELETAL: No unrelieved bone/joint pain NEUROLOGIC: No headache, dizziness  PSYCHIATRIC: No overt anxiety or sadness  Vitals:   02/19/17 1536  BP: 120/66  Pulse: 98  Resp: 18  Temp: 98.2 F (36.8 C)  SpO2: 96%   Body mass index is 24.03 kg/m. Physical Exam  GENERAL APPEARANCE: Alert, conversant, No acute distress  SKIN: No diaphoresis rash HEENT: Unremarkable RESPIRATORY: Breathing is even, unlabored. Lung sounds are clear   CARDIOVASCULAR: Heart RRR no murmurs, rubs or gallops. No peripheral edema  GASTROINTESTINAL: Abdomen is soft, non-tender, not distended w/ normal bowel sounds.  GENITOURINARY: Bladder non tender, not distended  MUSCULOSKELETAL: No abnormal joints or musculature NEUROLOGIC: Cranial nerves 2-12 grossly intact. Moves all extremities PSYCHIATRIC: Mood and affect With dementia, no behavioral  issues  Patient Active Problem List   Diagnosis Date Noted  . Osteoporosis 03/18/2016  . Depression 12/23/2015  . Chronic diastolic heart failure (HCC) 09/05/2015  . Right leg pain 08/05/2015  . COPD (chronic obstructive pulmonary disease) (HCC) 06/25/2015  . Loss of weight 01/21/2015  . Anemia, iron deficiency 01/05/2015  . Acute upper respiratory infection 01/05/2015  . Iron deficiency anemia 12/10/2014  . Stomach discomfort 11/30/2014  . Subacute confusional state 11/30/2014  . Acute respiratory failure with hypoxia (HCC) 11/11/2014  . AKI (acute kidney injury) (HCC) 11/11/2014  . Hypernatremia 11/11/2014  . Hypokalemia 11/09/2014  . COPD exacerbation (HCC)   . Acute on chronic diastolic heart failure (HCC)   . Hypoxia   . SOB (shortness of breath)   . Pressure ulcer stage II 11/02/2014  . Palliative care encounter 11/02/2014  .  HCAP (healthcare-associated pneumonia) 11/01/2014  . Leukocytosis 10/15/2014  . Closed fracture of part of upper end of humerus 10/14/2014  . Poor balance 11/25/2013  . History of stroke 11/25/2013  . 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 Myalgia 09/05/2012  . Disequilibrium 09/05/2012  . Hypertension, essential 09/05/2012  . 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 Mobility impaired 07/15/2012  . Physical exam, routine 12/07/2011  . Cerumen impaction 04/04/2011  . Trapezius muscle spasm 04/04/2011  . Hypertensive heart disease with CHF (congestive heart failure) (HCC) 11/30/2010  . Hyperlipidemia 11/30/2010  . CVA (cerebral infarction) 11/30/2010  . Postmenopausal HRT (hormone replacement therapy) 11/30/2010  . Anxiety 11/30/2010  . Ankle weakness 11/30/2010    CMP     Component Value Date/Time   NA 139 09/28/2016   K 4.4 09/28/2016   CL 102 10/13/2015 1505   CO2 22 10/13/2015 1505   GLUCOSE 118 (H) 10/13/2015 1505   BUN 21 09/28/2016   CREATININE 0.7 09/28/2016   CREATININE 0.79 10/13/2015 1505   CALCIUM 9.1 10/13/2015  1505   PROT 6.3 10/13/2015 1505   ALBUMIN 3.8 10/13/2015 1505   ALBUMIN 3.8 10/13/2015 1505   AST 17 09/28/2016   ALT 16 09/28/2016   ALKPHOS 90 09/28/2016   BILITOT 0.6 10/13/2015 1505   GFRNONAA >60 11/05/2014 0225   GFRAA >60 11/05/2014 0225    Recent Labs  03/01/16 09/28/16  NA 138 139  K 4.3 4.4  BUN 28* 21  CREATININE 0.6 0.7    Recent Labs  03/01/16 09/28/16  AST  --  17  ALT  --  16  ALKPHOS 88 90    Recent Labs  02/29/16 09/28/16  WBC 7.6 6.6  HGB 13.0 12.7  HCT 39 39  PLT 182 167    Recent Labs  03/01/16 09/28/16  CHOL 160 140  LDLCALC 58 77  TRIG 318* 144   No results found for: MICROALBUR Lab Results  Component Value Date   TSH 4.61 09/28/2016   Lab Results  Component Value Date   HGBA1C 5.6 09/28/2016   Lab Results  Component Value Date   CHOL 140 09/28/2016   HDL 35 09/28/2016   LDLCALC 77 09/28/2016   LDLDIRECT 190.1 05/06/2014   TRIG 144 09/28/2016   CHOLHDL 7 05/06/2014    Significant Diagnostic Results in last 30 days:  No results found.  Assessment and Plan  Anxiety Controlled; continue Ativan 0.5 mg by mouth 3 times a day  Depression Chronic and stable; continue Remeron 7.5 mg daily at bedtime and Lexapro 10 mg by mouth daily  Iron deficiency anemia Stable; plan to continue iron 325 mg daily     Cassia Fein D. [name], MD

## 2017-02-19 NOTE — Assessment & Plan Note (Signed)
Controlled; continue Ativan 0.5 mg by mouth 3 times a day

## 2017-02-19 NOTE — Assessment & Plan Note (Signed)
Chronic and stable; continue Remeron 7.5 mg daily at bedtime and Lexapro 10 mg by mouth daily

## 2017-02-19 NOTE — Assessment & Plan Note (Signed)
Stable; plan to continue iron 325 mg daily

## 2017-03-08 IMAGING — CR DG ELBOW COMPLETE 3+V*R*
4 series · 4 of 4 positions shown · non-contrast
Comparison: None.

CLINICAL DATA: Status post fall, with swelling and deformity of the
right elbow. Initial encounter.

EXAM:
RIGHT ELBOW - COMPLETE 3+ VIEW

[x elbow lat right]
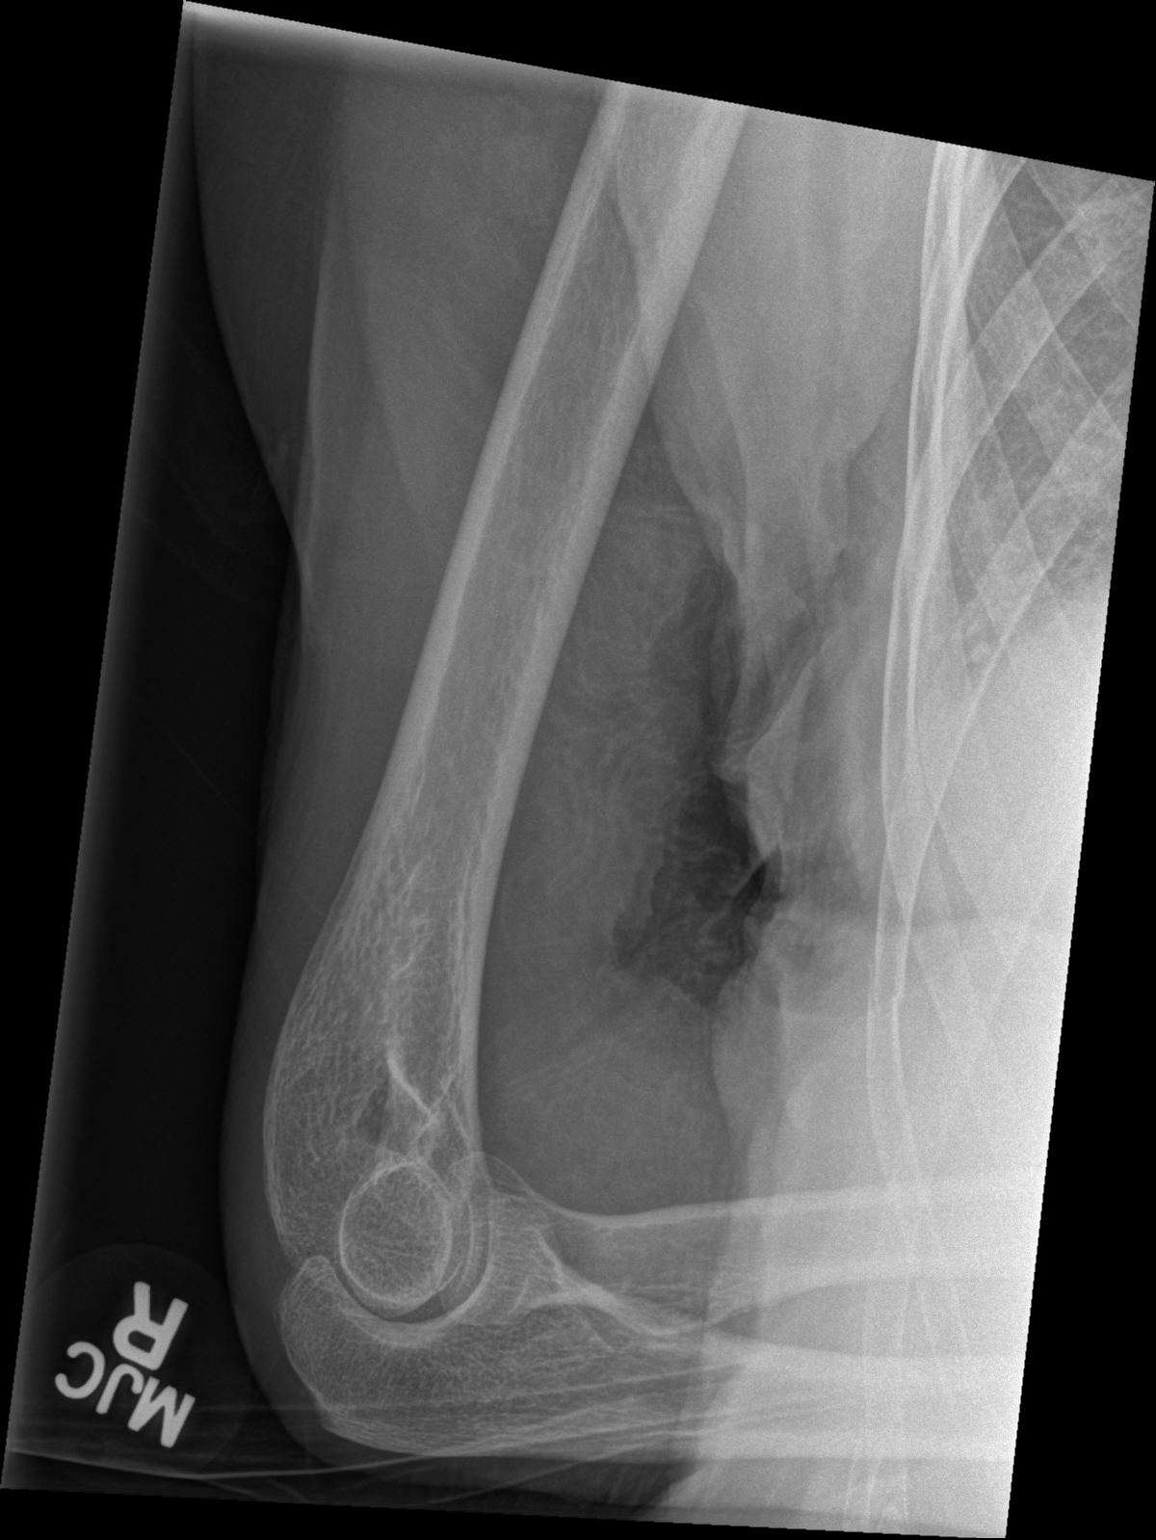

[x elbow ap right (1 of 2)]
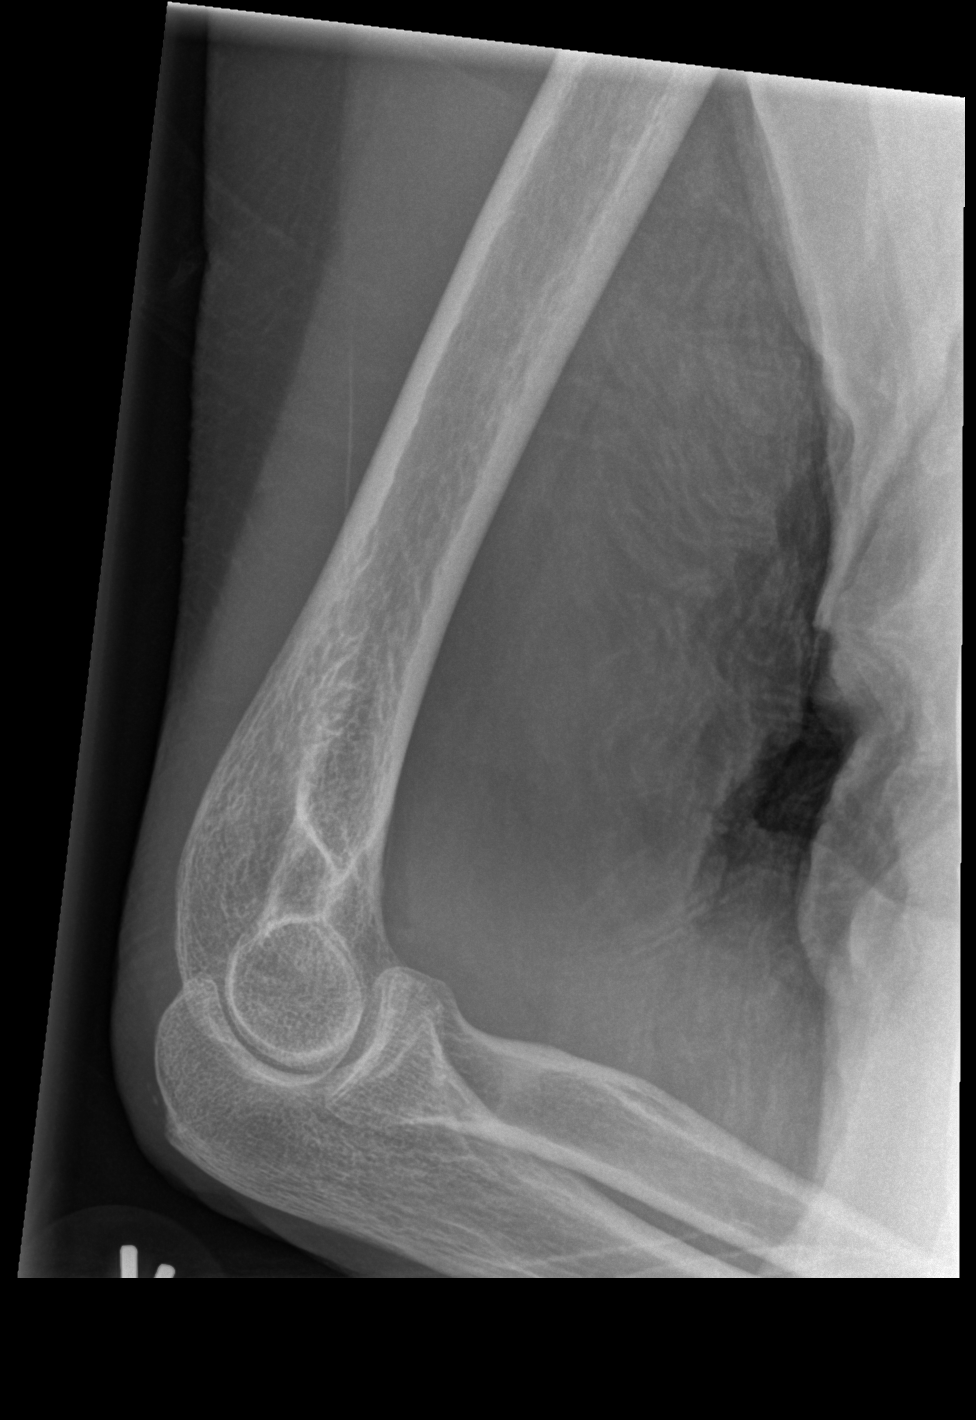

[x elbow obl right]
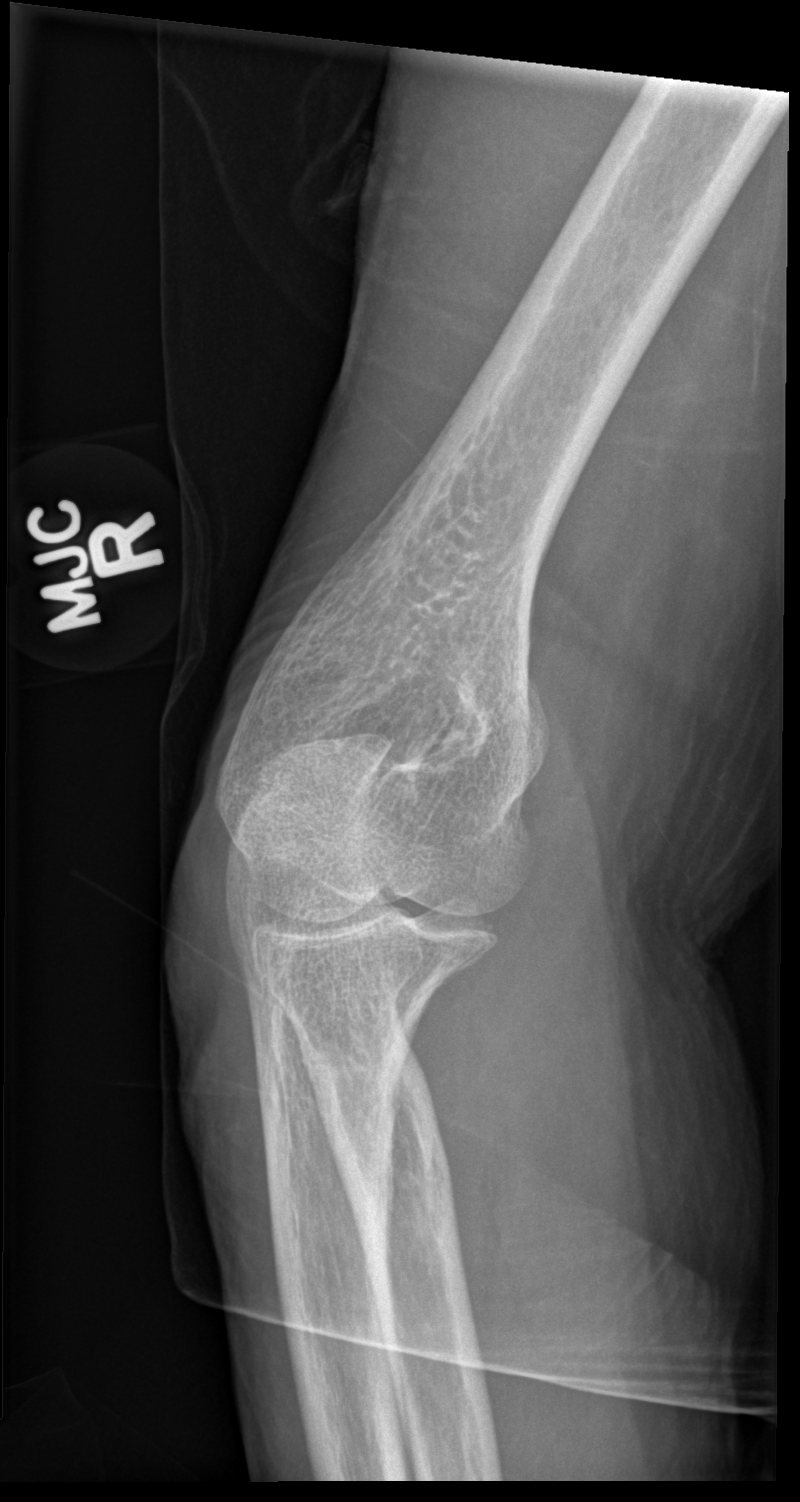

[x elbow ap right (2 of 2)]
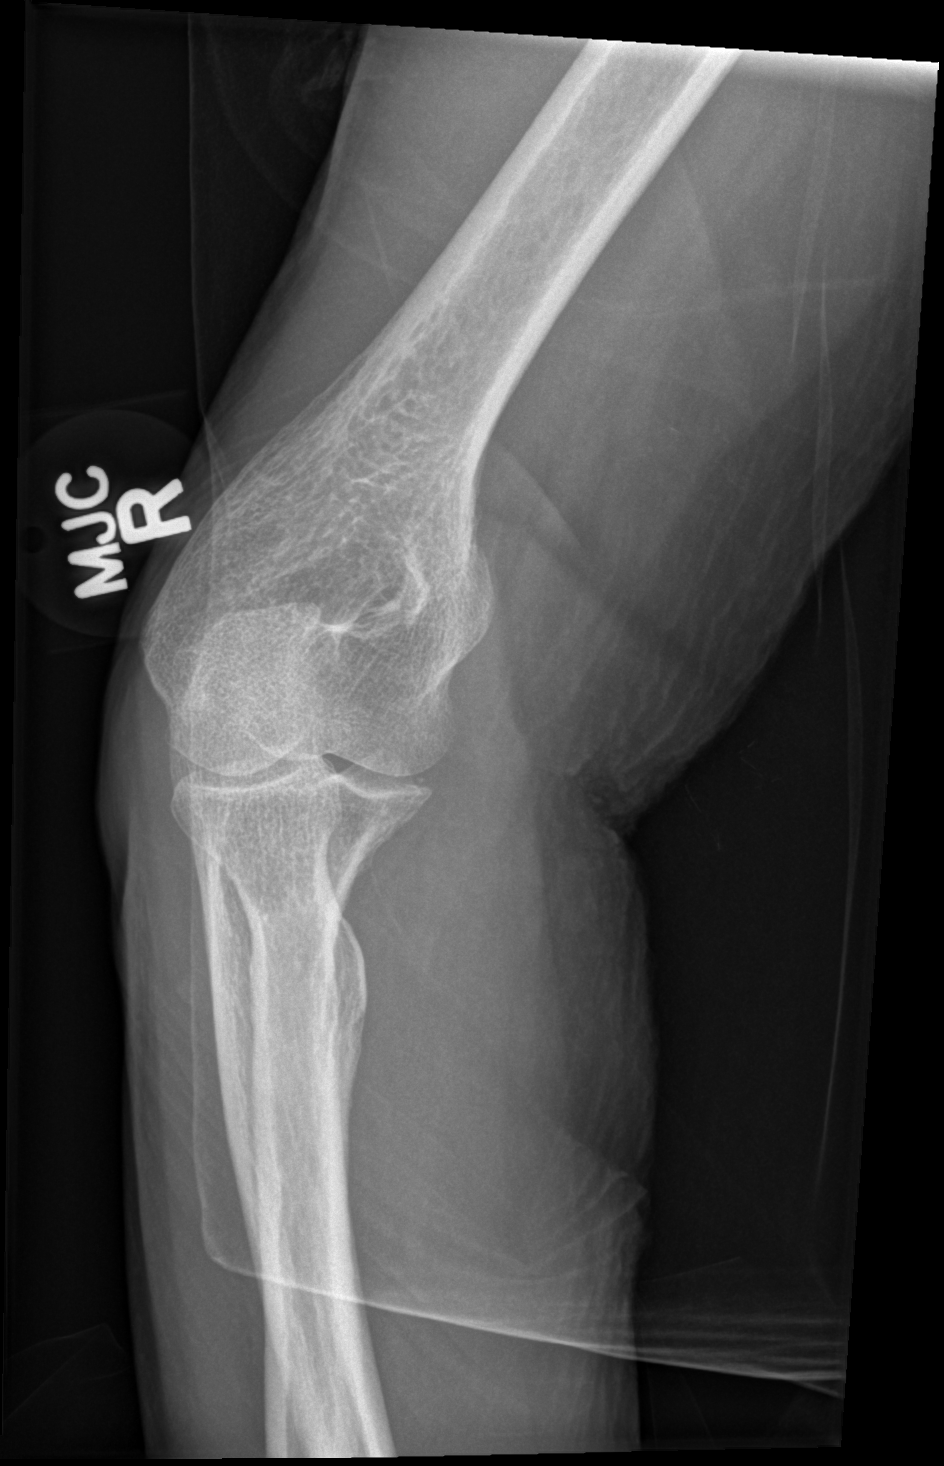

[4 of 4 positions shown; findings below may reference images not displayed]

FINDINGS: There is no evidence of fracture or dislocation. The visualized
joint spaces are preserved. No significant joint effusion is
identified. The soft tissues are unremarkable in appearance.
IMPRESSION: No evidence of fracture or dislocation.

## 2017-03-08 IMAGING — CR DG SHOULDER 2+V*R*
2 series · 2 of 2 positions shown · non-contrast
Comparison: None.

CLINICAL DATA: Status post fall, with obvious swelling and
deformity at the right shoulder. Initial encounter.

EXAM:
RIGHT SHOULDER - 2+ VIEW

[x shoulder ap right (1 of 2)]
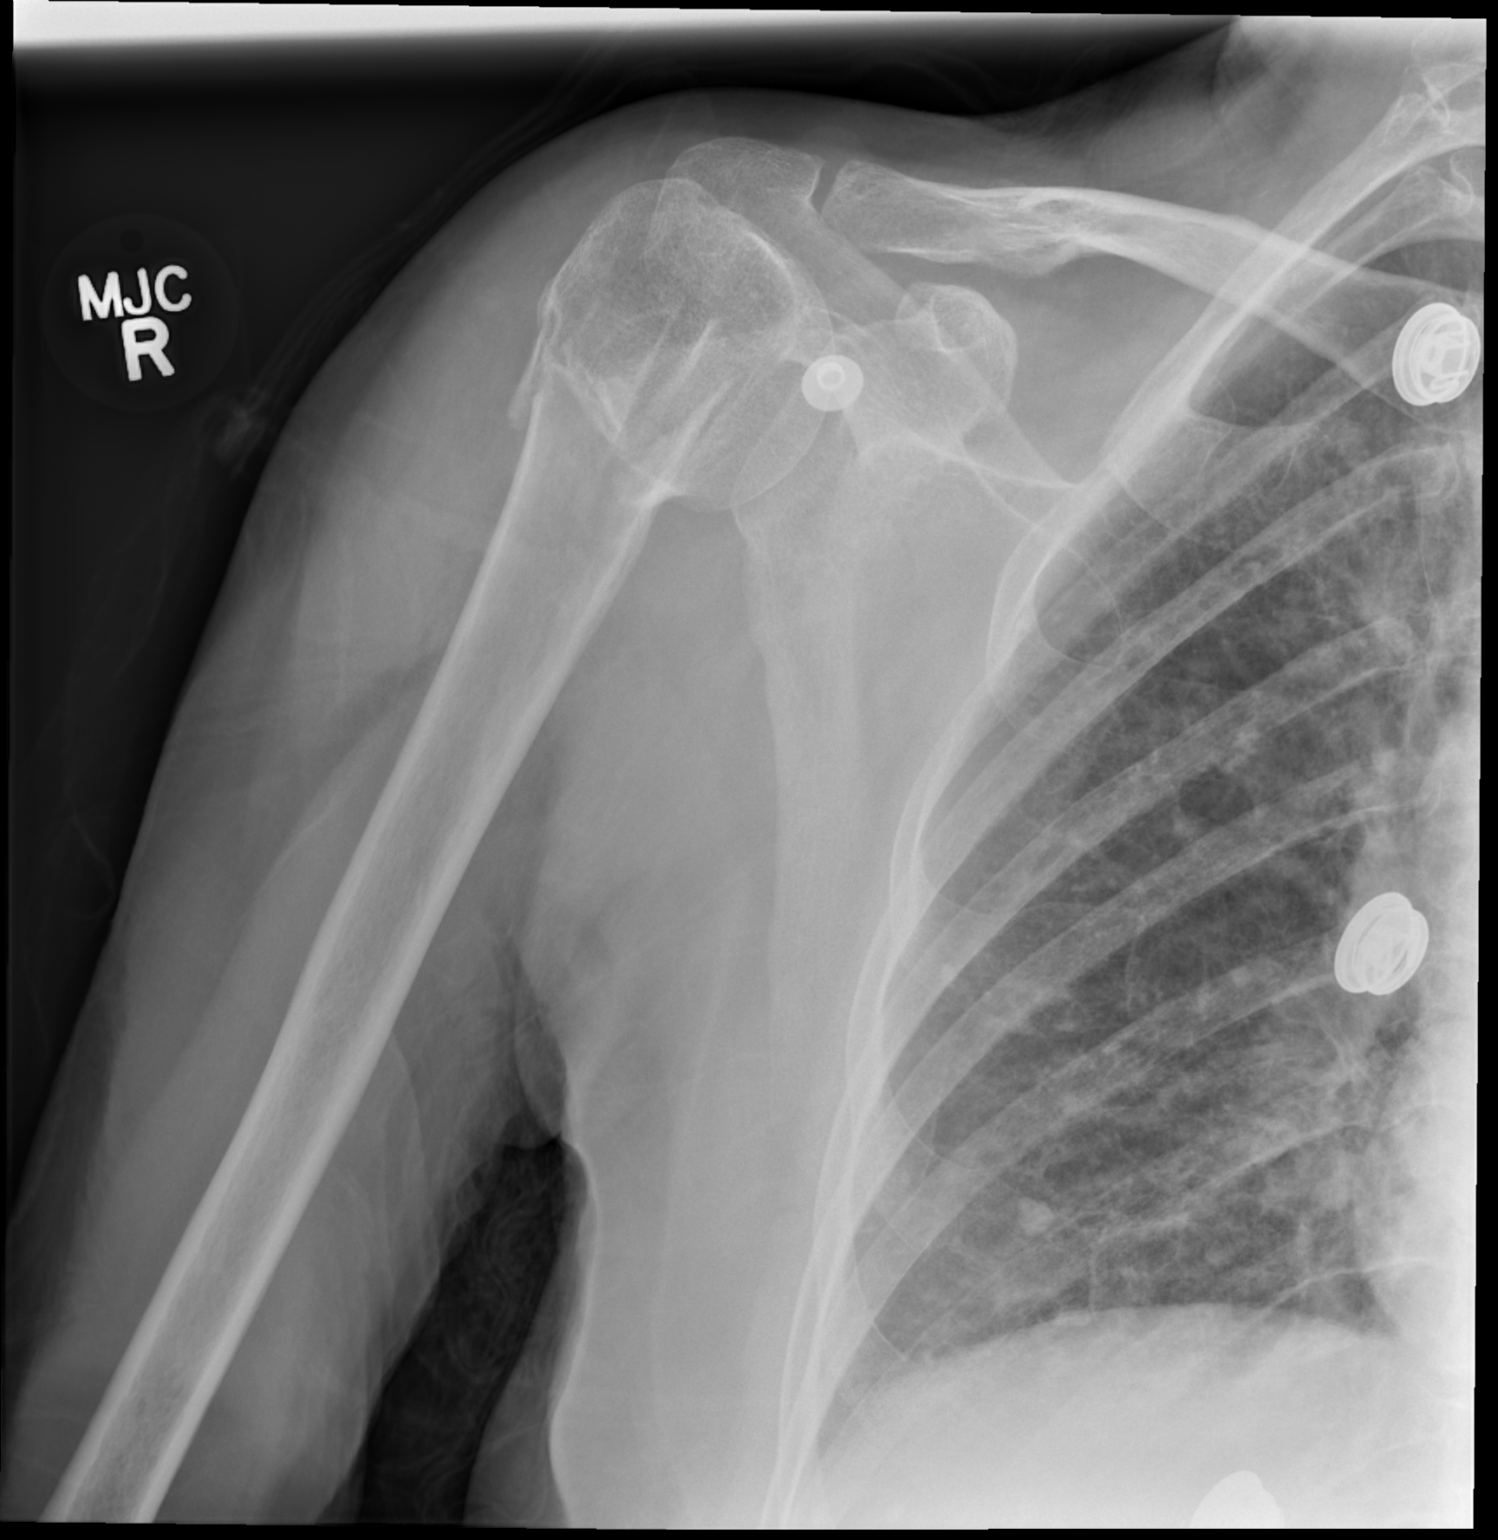

[x shoulder ap right (2 of 2)]
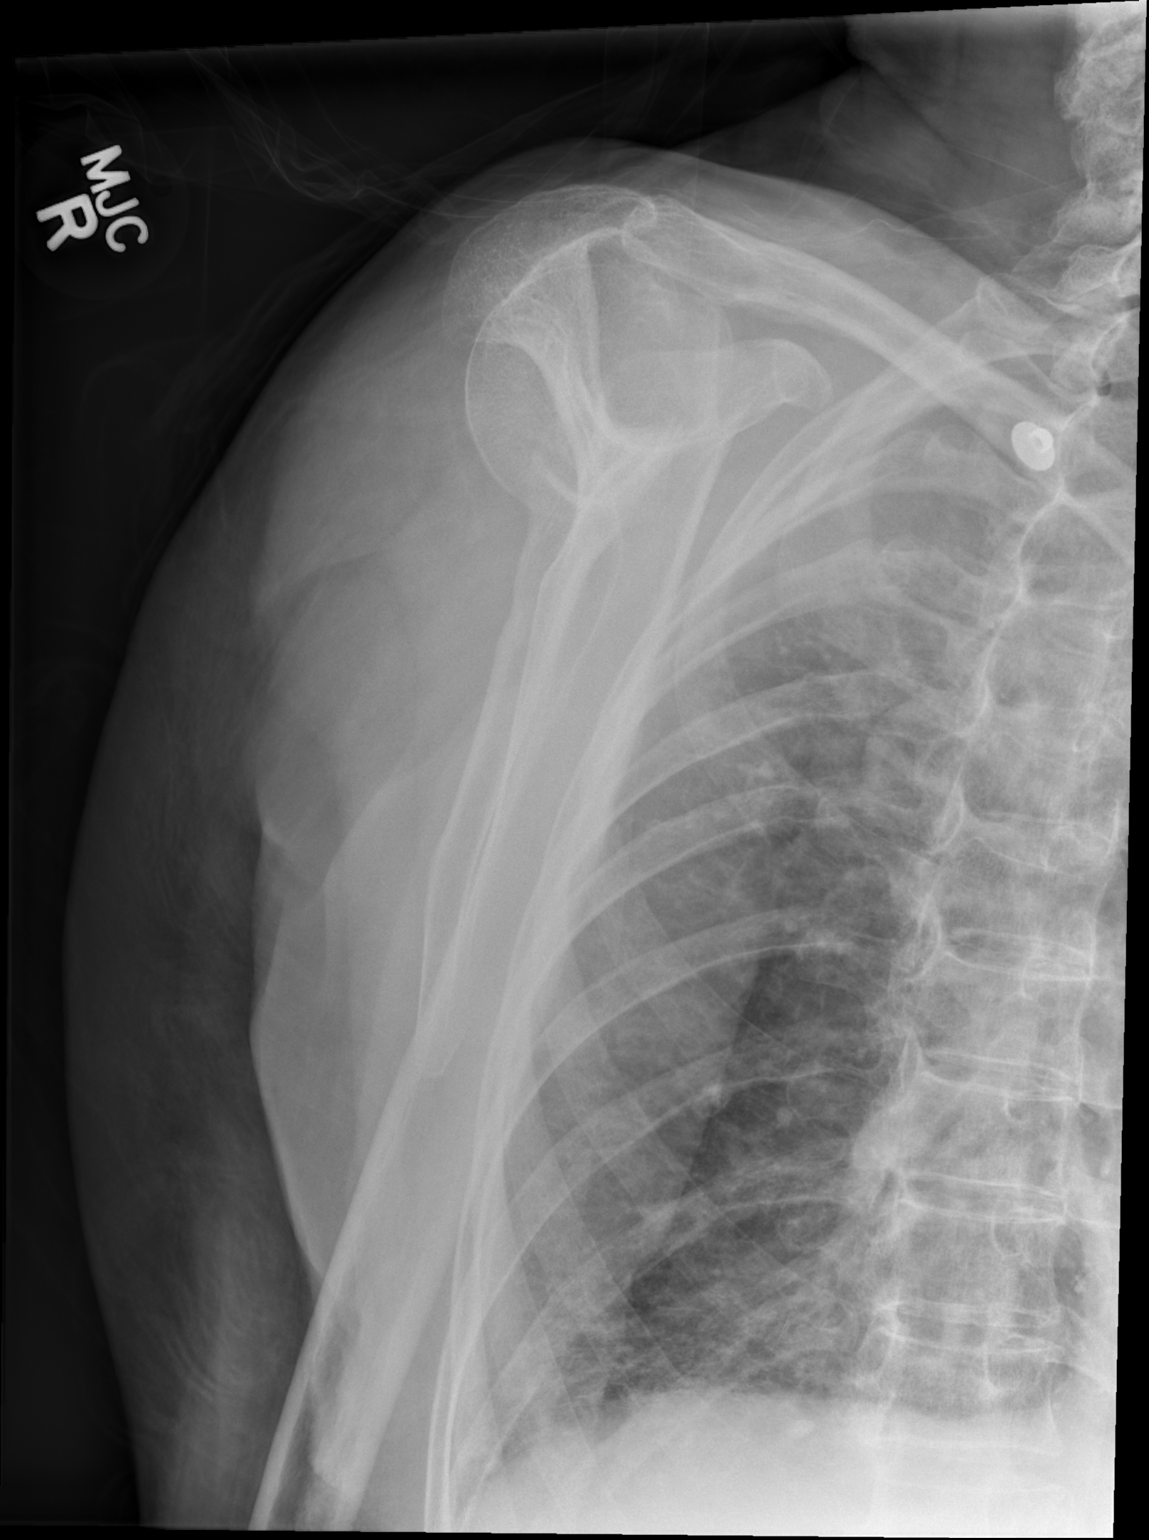

[2 of 2 positions shown; findings below may reference images not displayed]

FINDINGS: There is a mildly comminuted fracture involving the right humeral
neck, with mild proximal displacement of the distal humerus and
rotation of the humeral head. The humeral head remains seated at the
glenoid fossa. Superior subluxation of the humeral head suggests an
underlying chronic rotator cuff tear.

The right acromioclavicular joint is unremarkable in appearance.
Known soft swelling is not well characterized on radiograph. Mild
peribronchial thickening is noted at the right lung.
IMPRESSION: Mildly comminuted fracture involving the right humeral neck, with
mild proximal displacement of the distal humerus and rotation of the
humeral head. Superior subluxation of the humeral head suggests an
underlying chronic rotator cuff tear.

## 2017-04-02 IMAGING — CR DG CHEST 1V PORT
1 series · 1 of 1 positions shown · non-contrast
Comparison: Portable chest x-ray November 01, 2014.

CLINICAL DATA: Hypoxemia

EXAM:
PORTABLE CHEST - 1 VIEW

[AP]
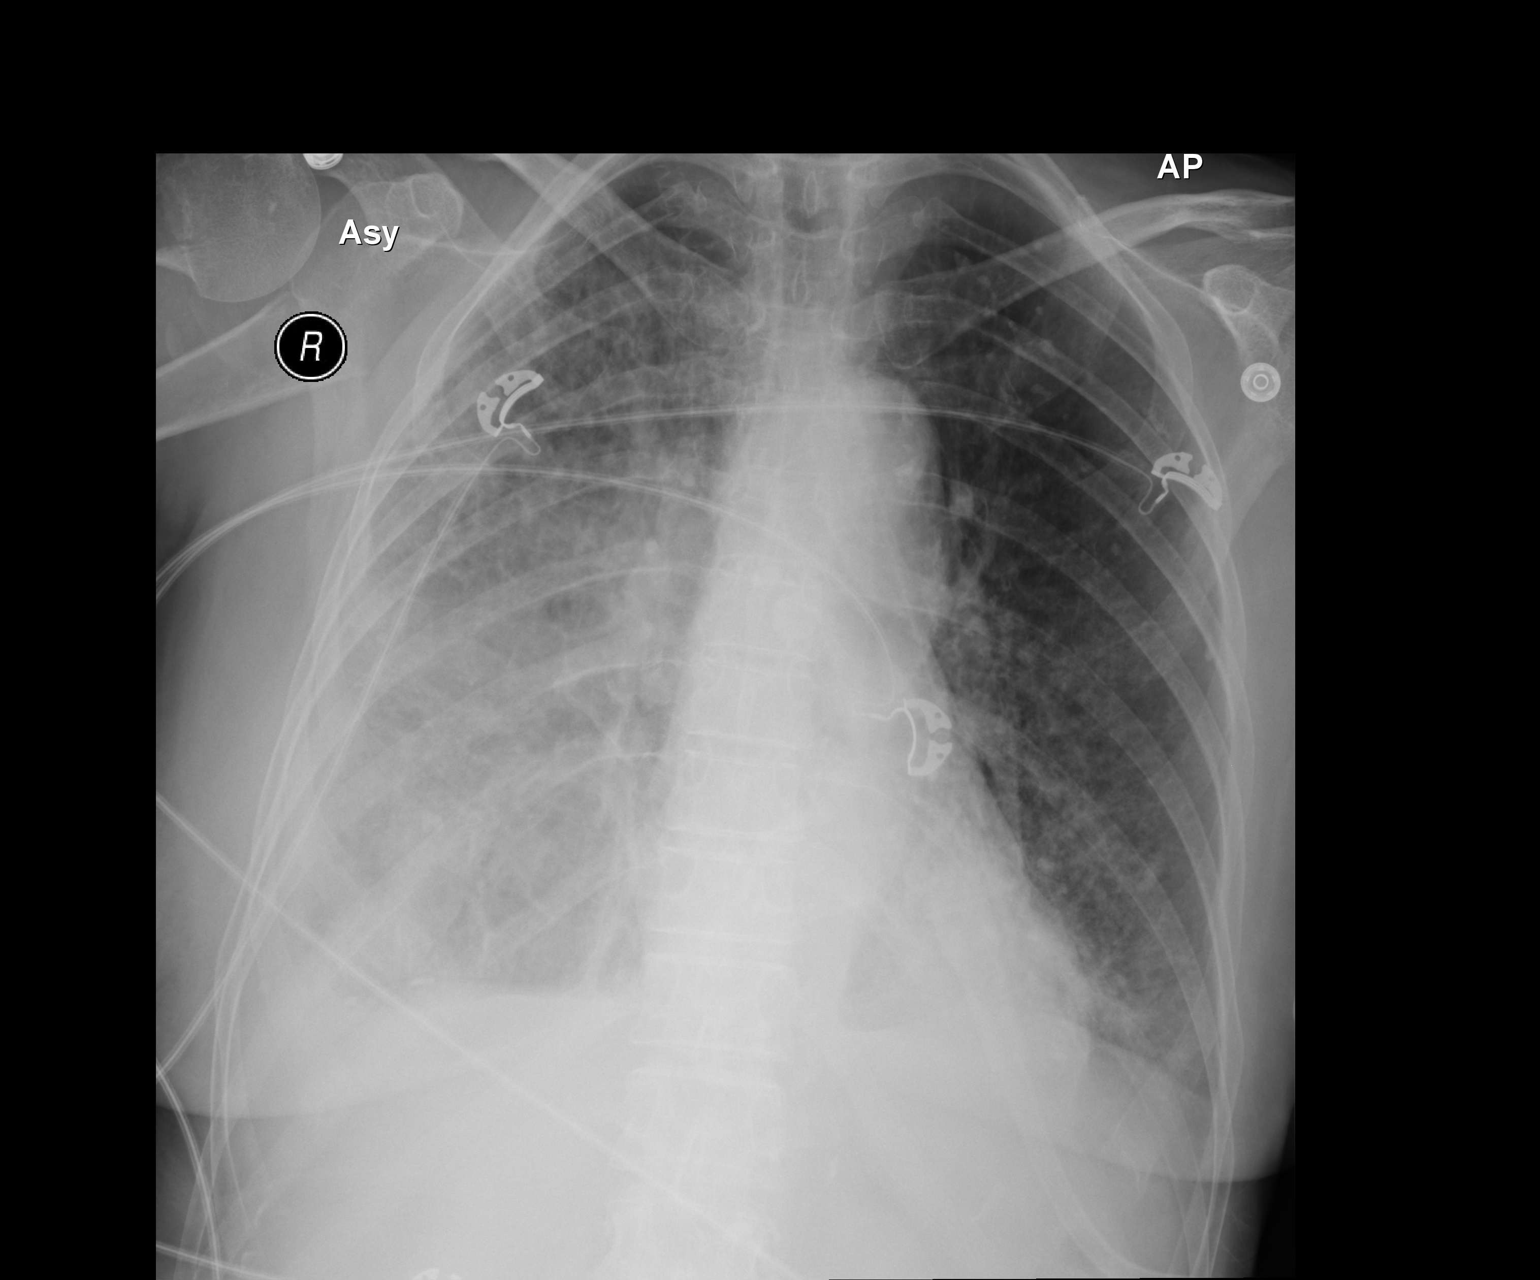

[1 of 1 positions shown; findings below may reference images not displayed]

FINDINGS: The lungs are well-expanded. Progressive increase in alveolar
density has appeared on the right. There is a small right pleural
effusion. There is no pneumothorax. On the left the interstitial
markings are slightly more conspicuous especially at the lung base.
The heart and pulmonary vascularity are normal. A displaced fracture
of the right humeral neck is again demonstrated.
IMPRESSION: Progressive airspace opacification on the right most compatible with
pneumonia with interval development of a small right pleural
effusion. Minimal atelectasis/ interstitial pneumonia at the left
lung base.

## 2017-04-04 ENCOUNTER — Encounter: Payer: Self-pay | Admitting: Internal Medicine

## 2017-04-04 ENCOUNTER — Non-Acute Institutional Stay (SKILLED_NURSING_FACILITY): Payer: Medicare Other | Admitting: Internal Medicine

## 2017-04-04 DIAGNOSIS — M818 Other osteoporosis without current pathological fracture: Secondary | ICD-10-CM

## 2017-04-04 DIAGNOSIS — F0280 Dementia in other diseases classified elsewhere without behavioral disturbance: Secondary | ICD-10-CM | POA: Diagnosis not present

## 2017-04-04 DIAGNOSIS — D508 Other iron deficiency anemias: Secondary | ICD-10-CM

## 2017-04-04 DIAGNOSIS — G20 Parkinson's disease: Secondary | ICD-10-CM

## 2017-04-04 NOTE — Progress Notes (Signed)
Location:  [facility] Room Number: 411 Place of Service:  SNF ([phone])  Margit HanksAlexander, Tu Shimmel D, MD  Patient Care Team: Margit HanksAlexander, Shaquill Iseman D, MD as PCP - General (Internal Medicine)  Extended Emergency Contact Information Primary Emergency Contact: [name],[name] Address: [address]          [place] [id number] [place] Home Phone: [phone] Relation: Daughter Secondary Emergency Contact: [name],[name]  [place] Home Phone: [phone] Relation: Daughter    Allergies: Sulfa antibiotics  Chief Complaint  Patient presents with  . Medical Management of Chronic Issues    routine visit    HPI: Patient is 83 y.o. female  who is being seen for routine issues of iron deficiency anemia, osteoporosis, and dementia is associated with parkinsonism.  Past Medical History:  Diagnosis Date  . Acute on chronic diastolic heart failure (HCC)   . Aneurysm (HCC)    of the eye  . Anxiety 11/30/2010  . Closed fracture of part of upper end of humerus 10/14/2014  . COPD exacerbation (HCC)   . CVA (cerebral infarction) 11/30/2010  . Decubitus ulcer of sacral region, stage 4 (HCC)   . 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 Depression 12/23/2015  . History of stroke 11/25/2013  . Hyperlipidemia   . Hypertension   . Hypertensive heart disease with CHF (congestive heart failure) (HCC) 11/30/2010  . Iron deficiency anemia 12/10/2014  . Leukocytosis 10/15/2014  . Osteoporosis   . Postmenopausal HRT (hormone replacement therapy) 11/30/2010  . Stroke (HCC) 10/2008  . 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 EYE SURGERY    . PARTIAL HYSTERECTOMY  1968    Allergies as of 04/04/2017      Reactions   Sulfa Antibiotics    Cant remember reaction      Medication List        Accurate as of 04/04/17 11:59 PM. Always use  your most recent med list.          amLODipine 10 MG tablet Commonly known as:  NORVASC TAKE 1 TABLET AT BEDTIME   aspirin 81 MG chewable tablet Chew 81 mg by mouth daily.   atenolol 50 MG tablet Commonly known as:  TENORMIN TAKE 1 TABLET TWICE A DAY   atorvastatin 40 MG tablet Commonly known as:  LIPITOR TAKE 1 TABLET DAILY   cholecalciferol 1000 units tablet Commonly known as:  VITAMIN D Take 1,000 Units by mouth daily.   docusate sodium 100 MG capsule Commonly known as:  COLACE Take 100 mg by mouth 2 (two) times daily.   escitalopram 10 MG tablet Commonly known as:  LEXAPRO Take 10 mg by mouth daily.   ezetimibe 10 MG tablet Commonly known as:  ZETIA Take 10 mg by mouth. Take one tablet daily   ferrous sulfate 325 (65 FE) MG tablet Take 325 mg by mouth daily with breakfast.   fexofenadine 180 MG tablet Commonly known as:  ALLEGRA Take 180 mg by mouth daily.   ipratropium-albuterol 0.5-2.5 (3) MG/3ML Soln Commonly known as:  DUONEB Take 3 mLs by nebulization. Inhale 1 vial via nebulizer every 6 hours as needed for wheezing/SOB   LORazepam 0.5 MG tablet Commonly known as:  ATIVAN Take 1 tablet (0.5 mg total) by mouth every 8 (eight) hours.   memantine 10 MG tablet Commonly known as:  NAMENDA Take 10 mg  by mouth 2 (two) times daily.   mirtazapine 7.5 MG tablet Commonly known as:  REMERON Take 7.5 mg by mouth. Take one tablet at bedtime   oxyCODONE 5 MG immediate release tablet Commonly known as:  Oxy IR/ROXICODONE Take 1 tablet (5 mg total) by mouth every 6 (six) hours as needed for severe pain.   pantoprazole 20 MG tablet Commonly known as:  PROTONIX Take 20 mg by mouth. Take one tablet twice daily   polyethylene glycol packet Commonly known as:  MIRALAX / GLYCOLAX Take 17 g by mouth daily. To be hold for diarrhea   Potassium Chloride ER 20 MEQ Tbcr Take 1 tablet by mouth daily.   sennosides-docusate sodium 8.6-50 MG tablet Commonly known  as:  SENOKOT-S Take 1 tablet by mouth 2 (two) times daily.   TAB-A-VITE Tabs Take 1 tablet by mouth daily.   traMADol 50 MG tablet Commonly known as:  ULTRAM Give 1 tablet by mouth every 8 hours scheduled for pain       No orders of the defined types were placed in this encounter.   Immunization History  Administered Date(s) Administered  . Influenza,inj,Quad PF,6+ Mos 04/16/2013, 05/06/2014  . Influenza-Unspecified 02/03/2015, 02/08/2016  . PPD Test 10/14/2014    Social History   Tobacco Use  . Smoking status: Former Smoker    Packs/day: 0.50    Years: 50.00    Pack years: 25.00  . Smokeless tobacco: Never Used  Substance Use Topics  . Alcohol use: No    Alcohol/week: 0.0 oz    Review of Systems  DATA OBTAINED: from patient, nurse GENERAL:  no fevers, fatigue, appetite changes SKIN: No itching, rash HEENT: No complaint RESPIRATORY: No cough, wheezing, SOB CARDIAC: No chest pain, palpitations, lower extremity edema  GI: No abdominal pain, No N/V/D or constipation, No heartburn or reflux  GU: No dysuria, frequency or urgency, or incontinence  MUSCULOSKELETAL: No unrelieved bone/joint pain NEUROLOGIC: No headache, dizziness  PSYCHIATRIC: No overt anxiety or sadness  Vitals:   04/04/17 1223  BP: 132/74  Pulse: 84  Resp: 18  Temp: 98.6 F (37 C)  SpO2: 96%   Body mass index is 23.86 kg/m. Physical Exam  GENERAL APPEARANCE: Alert, conversant, No acute distress  SKIN: No diaphoresis rash HEENT: Unremarkable RESPIRATORY: Breathing is even, unlabored. Lung sounds are clear   CARDIOVASCULAR: Heart RRR no murmurs, rubs or gallops. No peripheral edema  GASTROINTESTINAL: Abdomen is soft, non-tender, not distended w/ normal bowel sounds.  GENITOURINARY: Bladder non tender, not distended  MUSCULOSKELETAL: No abnormal joints or musculature NEUROLOGIC: Cranial nerves 2-12 grossly intact. Moves all extremities PSYCHIATRIC: Mood and affect appropriate to  situation with dementia, no behavioral issues  Patient Active Problem List   Diagnosis Date Noted  . Osteoporosis 03/18/2016  . Depression 12/23/2015  . Chronic diastolic heart failure (HCC) 09/05/2015  . Right leg pain 08/05/2015  . COPD (chronic obstructive pulmonary disease) (HCC) 06/25/2015  . Loss of weight 01/21/2015  . Anemia, iron deficiency 01/05/2015  . Acute upper respiratory infection 01/05/2015  . Iron deficiency anemia 12/10/2014  . Stomach discomfort 11/30/2014  . Subacute confusional state 11/30/2014  . Acute respiratory failure with hypoxia (HCC) 11/11/2014  . AKI (acute kidney injury) (HCC) 11/11/2014  . Hypernatremia 11/11/2014  . Hypokalemia 11/09/2014  . COPD exacerbation (HCC)   . Acute on chronic diastolic heart failure (HCC)   . Hypoxia   . SOB (shortness of breath)   . Pressure ulcer stage II 11/02/2014  . Palliative  care encounter 11/02/2014  . HCAP (healthcare-associated pneumonia) 11/01/2014  . Leukocytosis 10/15/2014  . Closed fracture of part of upper end of humerus 10/14/2014  . Poor balance 11/25/2013  . History of stroke 11/25/2013  . 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 Myalgia 09/05/2012  . Disequilibrium 09/05/2012  . Hypertension, essential 09/05/2012  . 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 Mobility impaired 07/15/2012  . Physical exam, routine 12/07/2011  . Cerumen impaction 04/04/2011  . Trapezius muscle spasm 04/04/2011  . Hypertensive heart disease with CHF (congestive heart failure) (HCC) 11/30/2010  . Hyperlipidemia 11/30/2010  . CVA (cerebral infarction) 11/30/2010  . Postmenopausal HRT (hormone replacement therapy) 11/30/2010  . Anxiety 11/30/2010  . Ankle weakness 11/30/2010    CMP     Component Value Date/Time   NA 139 09/28/2016   K 4.4 09/28/2016   CL 102 10/13/2015 1505   CO2 22 10/13/2015 1505   GLUCOSE 118 (H) 10/13/2015 1505   BUN 21 09/28/2016   CREATININE 0.7 09/28/2016   CREATININE 0.79  10/13/2015 1505   CALCIUM 9.1 10/13/2015 1505   PROT 6.3 10/13/2015 1505   ALBUMIN 3.8 10/13/2015 1505   ALBUMIN 3.8 10/13/2015 1505   AST 17 09/28/2016   ALT 16 09/28/2016   ALKPHOS 90 09/28/2016   BILITOT 0.6 10/13/2015 1505   GFRNONAA >60 11/05/2014 0225   GFRAA >60 11/05/2014 0225   Recent Labs    09/28/16  NA 139  K 4.4  BUN 21  CREATININE 0.7   Recent Labs    09/28/16  AST 17  ALT 16  ALKPHOS 90   Recent Labs    09/28/16  WBC 6.6  HGB 12.7  HCT 39  PLT 167   Recent Labs    09/28/16  CHOL 140  LDLCALC 77  TRIG 144   No results found for: MICROALBUR Lab Results  Component Value Date   TSH 4.61 09/28/2016   Lab Results  Component Value Date   HGBA1C 5.6 09/28/2016   Lab Results  Component Value Date   CHOL 140 09/28/2016   HDL 35 09/28/2016   LDLCALC 77 09/28/2016   LDLDIRECT 190.1 05/06/2014   TRIG 144 09/28/2016   CHOLHDL 7 05/06/2014    Significant Diagnostic Results in last 30 days:  No results found.  Assessment and Plan  Iron deficiency anemia Hemoglobin 12.7 which is stable from prior; plan to continue iron 325 mg by mouth daily  Osteoporosis No reported falls or fractures; plan to continue vitamin D thousand units daily  Dementia due to Parkinson's disease without behavioral disturbance (HCC) Stable and chronic; plan to continue Namenda 10 mg by mouth twice a day     Thurston Holenne D. Lyn HollingsheadAlexander, MD

## 2017-04-06 ENCOUNTER — Encounter: Payer: Self-pay | Admitting: Internal Medicine

## 2017-04-06 NOTE — Assessment & Plan Note (Signed)
No reported falls or fractures; plan to continue vitamin D thousand units daily

## 2017-04-06 NOTE — Assessment & Plan Note (Signed)
Stable and chronic; plan to continue Namenda 10 mg by mouth twice a day 

## 2017-04-06 NOTE — Assessment & Plan Note (Signed)
Hemoglobin 12.7 which is stable from prior; plan to continue iron 325 mg by mouth daily

## 2017-04-07 IMAGING — DX DG CHEST 2V
2 series · 3 of 3 positions shown · non-contrast
Comparison: 11/04/2014

CLINICAL DATA: Shortness of breath.  Low O2 sats.

EXAM:
CHEST  2 VIEW

[chest lat]
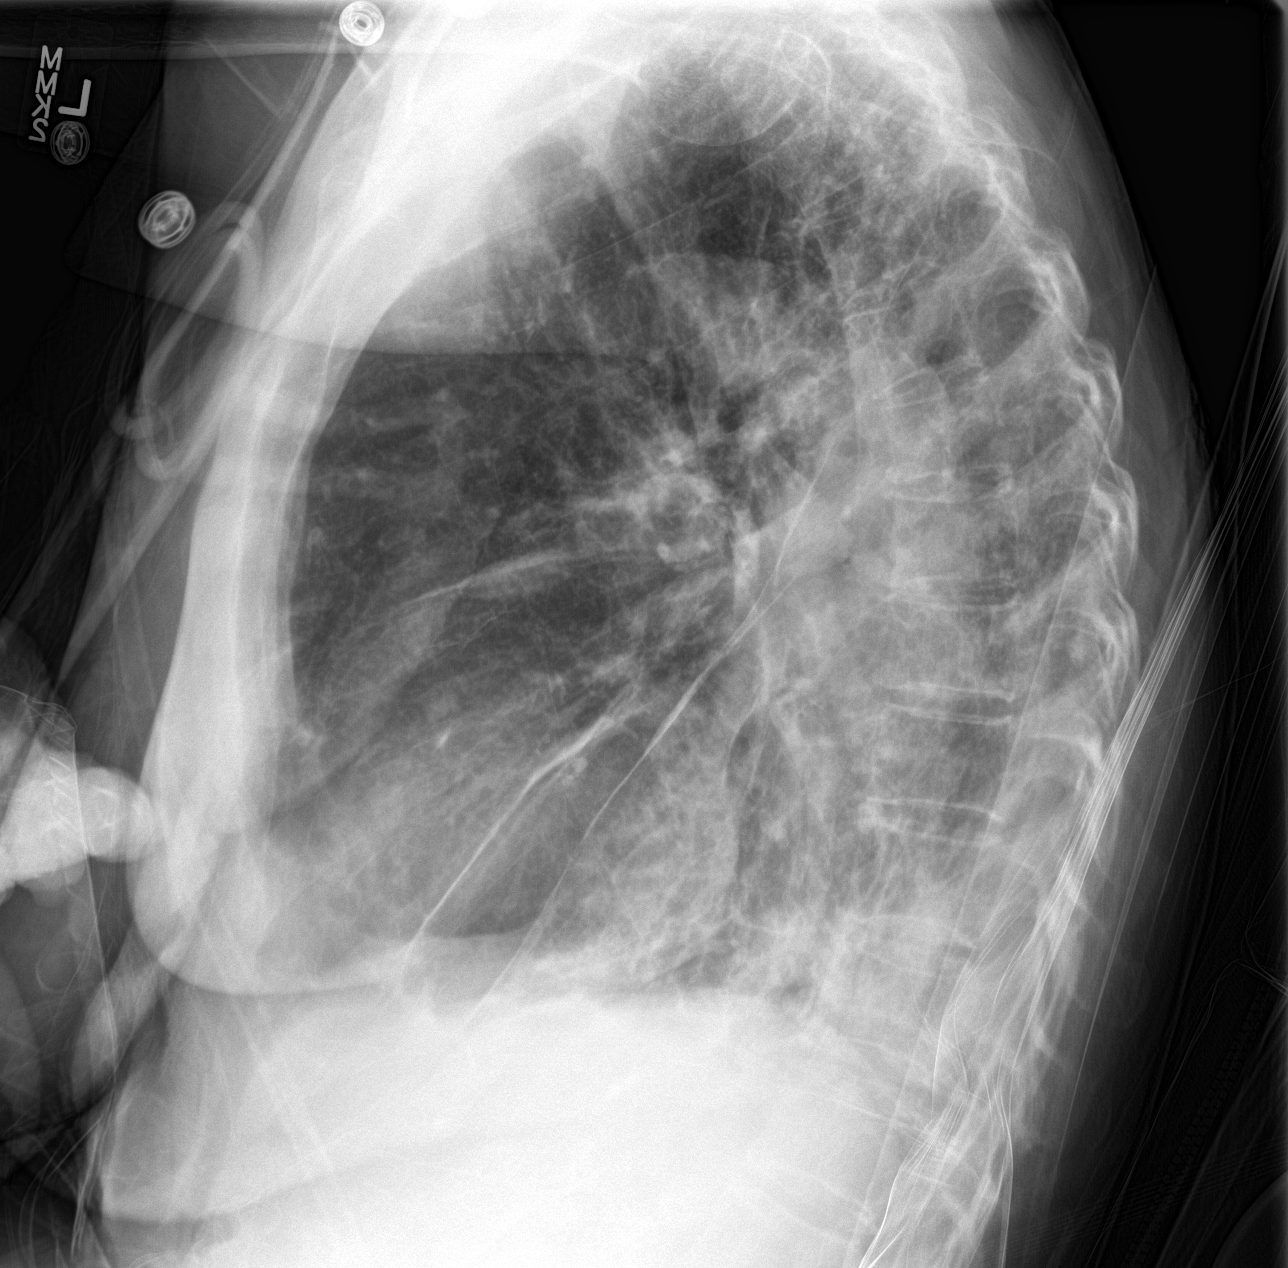

[Series 2: chest ap · 0.14mm/px · 2 of 2 slices shown]
[im 1/2]
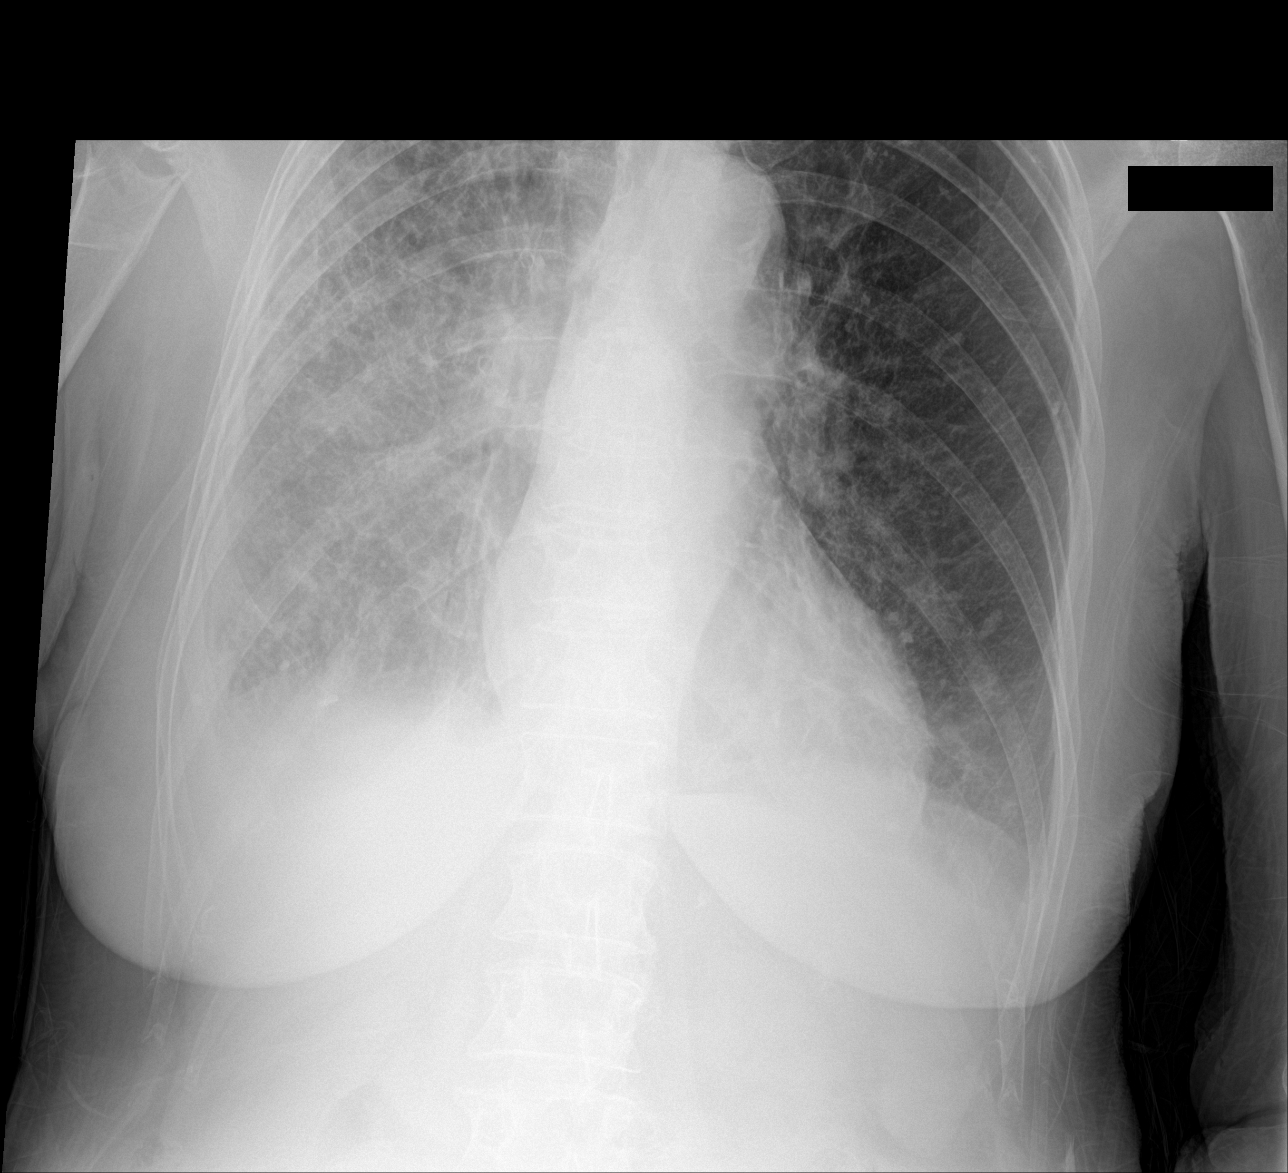
[im 2/2]
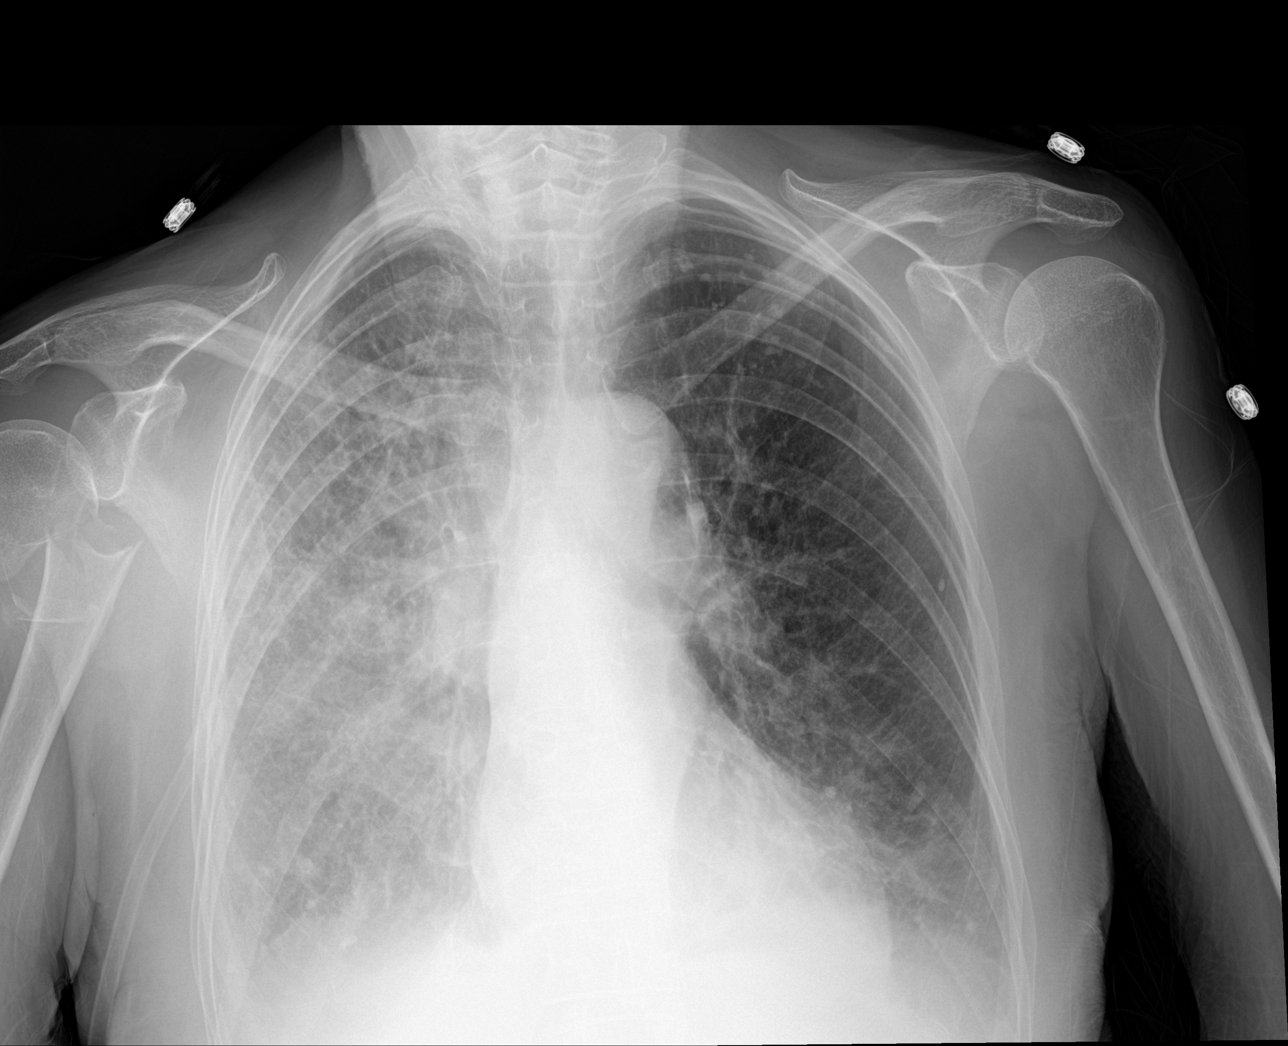

[3 of 3 positions shown; findings below may reference images not displayed]

FINDINGS: Consolidation throughout much of the right lung, particularly the
right lower lobe concerning for pneumonia. Slight interstitial
prominence throughout the left lung with left lower lobe atelectasis
or infiltrate. Small right pleural effusion. Heart is normal size.
Mediastinal contours are within normal limits.

Numerous calcified nodules throughout the lungs, left greater than
right compatible with old granulomatous disease. Again noted is the
displaced right humeral neck fracture.
IMPRESSION: Continued diffuse right lung airspace disease, most confluent in the
right lower lobe concerning for pneumonia. Left lower lobe
atelectasis or infiltrate.

Old granulomas disease.

Small right effusion.

Displaced right humeral neck fracture.

## 2017-04-10 ENCOUNTER — Other Ambulatory Visit: Payer: Self-pay

## 2017-04-10 LAB — LIPID PANEL
Cholesterol: 165 (ref 0–200)
HDL: 42 (ref 35–70)
LDL CALC: 95
TRIGLYCERIDES: 138 (ref 40–160)

## 2017-04-10 LAB — HEPATIC FUNCTION PANEL
ALK PHOS: 106 (ref 25–125)
ALT: 15 (ref 7–35)
AST: 16 (ref 13–35)
BILIRUBIN, TOTAL: 0.3

## 2017-04-10 LAB — CBC AND DIFFERENTIAL
HCT: 44 (ref 36–46)
Hemoglobin: 14.4 (ref 12.0–16.0)
PLATELETS: 187 (ref 150–399)
WBC: 6.4

## 2017-04-10 LAB — HEMOGLOBIN A1C: Hemoglobin A1C: 5.6

## 2017-04-10 LAB — TSH: TSH: 2.6 (ref 0.41–5.90)

## 2017-04-10 LAB — VITAMIN D 25 HYDROXY (VIT D DEFICIENCY, FRACTURES): Vit D, 25-Hydroxy: 36.08

## 2017-04-10 LAB — BASIC METABOLIC PANEL
BUN: 23 — AB (ref 4–21)
Creatinine: 0.5 (ref 0.5–1.1)
GLUCOSE: 101
Potassium: 4.6 (ref 3.4–5.3)
Sodium: 138 (ref 137–147)

## 2017-05-04 ENCOUNTER — Non-Acute Institutional Stay (SKILLED_NURSING_FACILITY): Payer: Medicare Other | Admitting: Internal Medicine

## 2017-05-04 ENCOUNTER — Encounter: Payer: Self-pay | Admitting: Internal Medicine

## 2017-05-04 DIAGNOSIS — I110 Hypertensive heart disease with heart failure: Secondary | ICD-10-CM | POA: Diagnosis not present

## 2017-05-04 DIAGNOSIS — J42 Unspecified chronic bronchitis: Secondary | ICD-10-CM

## 2017-05-04 DIAGNOSIS — I5032 Chronic diastolic (congestive) heart failure: Secondary | ICD-10-CM | POA: Diagnosis not present

## 2017-05-04 NOTE — Progress Notes (Signed)
[facility] Room Number: [phone] Place of Service:  SNF [phone])  Margit HanksAlexander, Darcy Cordner D, MD  Patient Care Team: Margit HanksAlexander, Manuelita Moxon D, MD as PCP - General (Internal Medicine)  Extended Emergency Contact Information Primary Emergency Contact: [name],[name] Address: [address]          [place] [id number] [place] Home Phone: [phone] Relation: Daughter Secondary Emergency Contact: [name],[name]  [place] Home Phone: [phone] Relation: Daughter    Allergies: Sulfa antibiotics  Chief Complaint  Patient presents with  . Medical Management of Chronic Issues    routine visit  . Health Maintenance    order written to give patient Prevnar 13 vaccine 0.35ml.     HPI: Patient is 83 y.o. female who is being seen for routine issues of chronic diastolic congestive heart failure, hypertension, and COPD.  Past Medical History:  Diagnosis Date  . Acute on chronic diastolic heart failure (HCC)   . Aneurysm (HCC)    of the eye  . Anxiety 11/30/2010  . Closed fracture of part of upper end of humerus 10/14/2014  . COPD exacerbation (HCC)   . CVA (cerebral infarction) 11/30/2010  . Decubitus ulcer of sacral region, stage 4 (HCC)   . 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 Depression 12/23/2015  . History of stroke 11/25/2013  . Hyperlipidemia   . Hypertension   . Hypertensive heart disease with CHF (congestive heart failure) (HCC) 11/30/2010  . Iron deficiency anemia 12/10/2014  . Leukocytosis 10/15/2014  . Osteoporosis   . Postmenopausal HRT (hormone replacement therapy) 11/30/2010  . Stroke (HCC) 10/2008  . 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 EYE SURGERY    . PARTIAL HYSTERECTOMY  1968    Allergies as of 05/04/2017      Reactions   Sulfa Antibiotics    Cant remember reaction        Medication List        Accurate as of 05/04/17 11:59 PM. Always use your most recent med list.          amLODipine 10 MG tablet Commonly known as:  NORVASC TAKE 1 TABLET AT BEDTIME   aspirin 81 MG chewable tablet Chew 81 mg by mouth daily.   atenolol 50 MG tablet Commonly known as:  TENORMIN TAKE 1 TABLET TWICE A DAY   atorvastatin 40 MG tablet Commonly known as:  LIPITOR TAKE 1 TABLET DAILY   cholecalciferol 1000 units tablet Commonly known as:  VITAMIN D Take 1,000 Units by mouth daily.   docusate sodium 100 MG capsule Commonly known as:  COLACE Take 100 mg by mouth 2 (two) times daily.   escitalopram 5 MG tablet Commonly known as:  LEXAPRO Take 5 mg by mouth. Take one tablet daily   ezetimibe 10 MG tablet Commonly known as:  ZETIA Take 10 mg by mouth. Take one tablet daily   ferrous sulfate 325 (65 FE) MG tablet Take 325 mg by mouth daily with breakfast.   fexofenadine 180 MG tablet Commonly known as:  ALLEGRA Take 180 mg by mouth daily.   ipratropium-albuterol 0.5-2.5 (3) MG/3ML Soln Commonly known as:  DUONEB Take 3 mLs by nebulization. Inhale 1 vial via nebulizer every 6 hours as needed for wheezing/SOB   LORazepam 0.5 MG tablet Commonly known as:  ATIVAN Take 1 tablet (0.5 mg total) by mouth  every 8 (eight) hours.   memantine 10 MG tablet Commonly known as:  NAMENDA Take 10 mg by mouth 2 (two) times daily.   mirtazapine 7.5 MG tablet Commonly known as:  REMERON Take 7.5 mg by mouth. Take one tablet at bedtime   oxyCODONE 5 MG immediate release tablet Commonly known as:  Oxy IR/ROXICODONE Take 1 tablet (5 mg total) by mouth every 6 (six) hours as needed for severe pain.   pantoprazole 20 MG tablet Commonly known as:  PROTONIX Take 20 mg by mouth. Take one tablet twice daily   polyethylene glycol packet Commonly known as:  MIRALAX / GLYCOLAX Take 17 g by mouth daily. To be hold for diarrhea   Potassium Chloride ER 20 MEQ Tbcr Take 1  tablet by mouth daily.   sennosides-docusate sodium 8.6-50 MG tablet Commonly known as:  SENOKOT-S Take 1 tablet by mouth 2 (two) times daily.   TAB-A-VITE Tabs Take 1 tablet by mouth daily.   traMADol 50 MG tablet Commonly known as:  ULTRAM Give 1 tablet by mouth every 8 hours scheduled for pain       No orders of the defined types were placed in this encounter.   Immunization History  Administered Date(s) Administered  . Influenza,inj,Quad PF,6+ Mos 04/16/2013, 05/06/2014  . Influenza-Unspecified 02/03/2015, 02/08/2016, 03/15/2017  . PPD Test 10/14/2014    Social History   Tobacco Use  . Smoking status: Former Smoker    Packs/day: 0.50    Years: 50.00    Pack years: 25.00  . Smokeless tobacco: Never Used  Substance Use Topics  . Alcohol use: No    Alcohol/week: 0.0 oz    Review of Systems  DATA OBTAINED: from patient, nurse GENERAL:  no fevers, fatigue, appetite changes SKIN: No itching, rash HEENT: No complaint RESPIRATORY: No cough, wheezing, SOB CARDIAC: No chest pain, palpitations, lower extremity edema  GI: No abdominal pain, No N/V/D or constipation, No heartburn or reflux  GU: No dysuria, frequency or urgency, or incontinence  MUSCULOSKELETAL: No unrelieved bone/joint pain NEUROLOGIC: No headache, dizziness  PSYCHIATRIC: No overt anxiety or sadness  Vitals:   05/04/17 1426  BP: 125/78  Pulse: 67  Resp: 18  Temp: 98.1 F (36.7 C)   Body mass index is 24.2 kg/m. Physical Exam  GENERAL APPEARANCE: Alert, conversant, No acute distress  SKIN: No diaphoresis rash HEENT: Unremarkable RESPIRATORY: Breathing is even, unlabored. Lung sounds are clear   CARDIOVASCULAR: Heart RRR no murmurs, rubs or gallops. No peripheral edema  GASTROINTESTINAL: Abdomen is soft, non-tender, not distended w/ normal bowel sounds.  GENITOURINARY: Bladder non tender, not distended  MUSCULOSKELETAL: No abnormal joints or musculature NEUROLOGIC: Cranial nerves 2-12  grossly intact. Moves all extremities PSYCHIATRIC: Mood and affect appropriate to situation with dementia, no behavioral issues  Patient Active Problem List   Diagnosis Date Noted  . Osteoporosis 03/18/2016  . Depression 12/23/2015  . Chronic diastolic heart failure (HCC) 09/05/2015  . Right leg pain 08/05/2015  . COPD (chronic obstructive pulmonary disease) (HCC) 06/25/2015  . Loss of weight 01/21/2015  . Anemia, iron deficiency 01/05/2015  . Acute upper respiratory infection 01/05/2015  . Iron deficiency anemia 12/10/2014  . Stomach discomfort 11/30/2014  . Subacute confusional state 11/30/2014  . Acute respiratory failure with hypoxia (HCC) 11/11/2014  . AKI (acute kidney injury) (HCC) 11/11/2014  . Hypernatremia 11/11/2014  . Hypokalemia 11/09/2014  . COPD exacerbation (HCC)   . Acute on chronic diastolic heart failure (HCC)   . Hypoxia   .  SOB (shortness of breath)   . Pressure ulcer stage II 11/02/2014  . Palliative care encounter 11/02/2014  . HCAP (healthcare-associated pneumonia) 11/01/2014  . Leukocytosis 10/15/2014  . Closed fracture of part of upper end of humerus 10/14/2014  . Poor balance 11/25/2013  . History of stroke 11/25/2013  . 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 Myalgia 09/05/2012  . Disequilibrium 09/05/2012  . Hypertension, essential 09/05/2012  . 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 Mobility impaired 07/15/2012  . Physical exam, routine 12/07/2011  . Cerumen impaction 04/04/2011  . Trapezius muscle spasm 04/04/2011  . Hypertensive heart disease with CHF (congestive heart failure) (HCC) 11/30/2010  . Hyperlipidemia 11/30/2010  . CVA (cerebral infarction) 11/30/2010  . Postmenopausal HRT (hormone replacement therapy) 11/30/2010  . Anxiety 11/30/2010  . Ankle weakness 11/30/2010    CMP     Component Value Date/Time   NA 138 04/10/2017   K 4.6 04/10/2017   CL 102 10/13/2015 1505   CO2 22 10/13/2015 1505   GLUCOSE 118 (H)  10/13/2015 1505   BUN 23 (A) 04/10/2017   CREATININE 0.5 04/10/2017   CREATININE 0.79 10/13/2015 1505   CALCIUM 9.1 10/13/2015 1505   PROT 6.3 10/13/2015 1505   ALBUMIN 3.8 10/13/2015 1505   ALBUMIN 3.8 10/13/2015 1505   AST 16 04/10/2017   ALT 15 04/10/2017   ALKPHOS 106 04/10/2017   BILITOT 0.6 10/13/2015 1505   GFRNONAA >60 11/05/2014 0225   GFRAA >60 11/05/2014 0225   Recent Labs    09/28/16 04/10/17  NA 139 138  K 4.4 4.6  BUN 21 23*  CREATININE 0.7 0.5   Recent Labs    09/28/16 04/10/17  AST 17 16  ALT 16 15  ALKPHOS 90 106   Recent Labs    09/28/16 04/10/17  WBC 6.6 6.4  HGB 12.7 14.4  HCT 39 44  PLT 167 187   Recent Labs    09/28/16 04/10/17  CHOL 140 165  LDLCALC 77 95  TRIG 144 138   No results found for: MICROALBUR Lab Results  Component Value Date   TSH 2.60 04/10/2017   Lab Results  Component Value Date   HGBA1C 5.6 04/10/2017   Lab Results  Component Value Date   CHOL 165 04/10/2017   HDL 42 04/10/2017   LDLCALC 95 04/10/2017   LDLDIRECT 190.1 05/06/2014   TRIG 138 04/10/2017   CHOLHDL 7 05/06/2014    Significant Diagnostic Results in last 30 days:  No results found.  Assessment and Plan  Chronic diastolic heart failure (HCC) Chronic and stable on Tenormin 50 mg by mouth twice a day; continue to monitor  Hypertensive heart disease with CHF (congestive heart failure) (HCC) BP controlled; continue Tenormin 50 mg by mouth twice a day and Norvasc 10 mg by mouth daily  COPD (chronic obstructive pulmonary disease) (HCC) No known exacerbations; continue Allegra 180 mg by mouth daily and when necessary albuterol    [name]. [name], MD

## 2017-05-31 ENCOUNTER — Encounter: Payer: Self-pay | Admitting: Internal Medicine

## 2017-05-31 NOTE — Assessment & Plan Note (Signed)
No known exacerbations; continue Allegra 180 mg by mouth daily and when necessary albuterol

## 2017-05-31 NOTE — Assessment & Plan Note (Signed)
BP controlled; continue Tenormin 50 mg by mouth twice a day and Norvasc 10 mg by mouth daily

## 2017-05-31 NOTE — Assessment & Plan Note (Signed)
Chronic and stable on Tenormin 50 mg by mouth twice a day; continue to monitor

## 2017-06-05 ENCOUNTER — Non-Acute Institutional Stay (SKILLED_NURSING_FACILITY): Payer: Medicare Other | Admitting: Internal Medicine

## 2017-06-05 ENCOUNTER — Encounter: Payer: Self-pay | Admitting: Internal Medicine

## 2017-06-05 DIAGNOSIS — E782 Mixed hyperlipidemia: Secondary | ICD-10-CM

## 2017-06-05 DIAGNOSIS — F419 Anxiety disorder, unspecified: Secondary | ICD-10-CM

## 2017-06-05 DIAGNOSIS — F32A Depression, unspecified: Secondary | ICD-10-CM

## 2017-06-05 DIAGNOSIS — F329 Major depressive disorder, single episode, unspecified: Secondary | ICD-10-CM

## 2017-06-05 NOTE — Progress Notes (Signed)
Location[facility]  [facility] Room Number: [id number] Place of Service:  SNF (31)  Margit HanksAlexander, Arthi Mcdonald D, MD  Patient Care Team: Margit HanksAlexander, Kendan Cornforth D, MD as PCP - General (Internal Medicine)  Extended Emergency Contact Information Primary Emergency Contact: [name],[name] Address: [address]          [place] [id number] [place] Home Phone: [phone] Relation: Daughter Secondary Emergency Contact: [name],[name]  [place] Home Phone: [phone] Relation: Daughter    Allergies: Sulfa antibiotics  Chief Complaint  Patient presents with  . Medical Management of Chronic Issues    Routine Visit    HPI: Patient is 84 y.o. female who is being seen for routine issues of hyperlipidemia, depression, and anxiety.  Past Medical History:  Diagnosis Date  . Acute on chronic diastolic heart failure (HCC)   . Aneurysm (HCC)    of the eye  . Anxiety 11/30/2010  . Closed fracture of part of upper end of humerus 10/14/2014  . COPD exacerbation (HCC)   . CVA (cerebral infarction) 11/30/2010  . Decubitus ulcer of sacral region, stage 4 (HCC)   . 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 Depression 12/23/2015  . History of stroke 11/25/2013  . Hyperlipidemia   . Hypertension   . Hypertensive heart disease with CHF (congestive heart failure) (HCC) 11/30/2010  . Iron deficiency anemia 12/10/2014  . Leukocytosis 10/15/2014  . Osteoporosis   . Postmenopausal HRT (hormone replacement therapy) 11/30/2010  . Stroke (HCC) 10/2008  . 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 EYE SURGERY    . PARTIAL HYSTERECTOMY  1968    Allergies as of 06/05/2017      Reactions   Sulfa Antibiotics    Cant remember reaction      Medication List        Accurate as of 06/05/17 11:59 PM. Always use your most recent med list.            amLODipine 10 MG tablet Commonly known as:  NORVASC TAKE 1 TABLET AT BEDTIME   aspirin 81 MG chewable tablet Chew 81 mg by mouth daily.   atenolol 50 MG tablet Commonly known as:  TENORMIN TAKE 1 TABLET TWICE A DAY   atorvastatin 40 MG tablet Commonly known as:  LIPITOR TAKE 1 TABLET DAILY   cholecalciferol 1000 units tablet Commonly known as:  VITAMIN D Take 1,000 Units by mouth daily.   docusate sodium 100 MG capsule Commonly known as:  COLACE Take 100 mg by mouth 2 (two) times daily.   escitalopram 5 MG tablet Commonly known as:  LEXAPRO Take 5 mg by mouth daily.   ezetimibe 10 MG tablet Commonly known as:  ZETIA Take 10 mg by mouth daily.   ferrous sulfate 325 (65 FE) MG tablet Take 325 mg by mouth daily with breakfast.   fexofenadine 180 MG tablet Commonly known as:  ALLEGRA Take 180 mg by mouth daily.   ipratropium-albuterol 0.5-2.5 (3) MG/3ML Soln Commonly known as:  DUONEB Take 3 mLs by nebulization every 6 (six) hours as needed. wheezing/SOB   LORazepam 0.5 MG tablet Commonly known as:  ATIVAN Take 1 tablet (0.5 mg total) by mouth every 8 (eight) hours.   memantine 10 MG tablet Commonly known as:  NAMENDA Take 10 mg by mouth 2 (two) times daily.   mirtazapine 7.5 MG tablet Commonly known  as:  REMERON Take 7.5 mg by mouth at bedtime.   oxyCODONE 5 MG immediate release tablet Commonly known as:  Oxy IR/ROXICODONE Take 1 tablet (5 mg total) by mouth every 6 (six) hours as needed for severe pain.   pantoprazole 20 MG tablet Commonly known as:  PROTONIX Take 20 mg by mouth 2 (two) times daily.   polyethylene glycol packet Commonly known as:  MIRALAX / GLYCOLAX Take 17 g by mouth daily. To be hold for diarrhea   Potassium Chloride ER 20 MEQ Tbcr Take 1 tablet by mouth daily.   sennosides-docusate sodium 8.6-50 MG tablet Commonly known as:  SENOKOT-S Take 2 tablets by mouth at bedtime.   TAB-A-VITE Tabs Take 1 tablet by mouth daily.    traMADol 50 MG tablet Commonly known as:  ULTRAM Give 1 tablet by mouth every 8 hours scheduled for pain       No orders of the defined types were placed in this encounter.   Immunization History  Administered Date(s) Administered  . Influenza,inj,Quad PF,6+ Mos 04/16/2013, 05/06/2014  . Influenza-Unspecified 02/03/2015, 02/08/2016, 03/15/2017  . PPD Test 10/14/2014    Social History   Tobacco Use  . Smoking status: Former Smoker    Packs/day: 0.50    Years: 50.00    Pack years: 25.00  . Smokeless tobacco: Never Used  Substance Use Topics  . Alcohol use: No    Alcohol/week: 0.0 oz    Review of Systems  DATA OBTAINED: from patient, nurse GENERAL:  no fevers, fatigue, appetite changes SKIN: No itching, rash HEENT: No complaint RESPIRATORY: No cough, wheezing, SOB CARDIAC: No chest pain, palpitations, lower extremity edema  GI: No abdominal pain, No N/V/D or constipation, No heartburn or reflux  GU: No dysuria, frequency or urgency, or incontinence  MUSCULOSKELETAL: No unrelieved bone/joint pain NEUROLOGIC: No headache, dizziness  PSYCHIATRIC: No overt anxiety or sadness  Vitals:   06/05/17 1225  BP: 132/68  Pulse: 64  Resp: 18  Temp: 98 F (36.7 C)  SpO2: 99%   Body mass index is 24.48 kg/m. Physical Exam  GENERAL APPEARANCE: Alert, conversant, No acute distress  SKIN: No diaphoresis rash HEENT: Unremarkable RESPIRATORY: Breathing is even, unlabored. Lung sounds are clear   CARDIOVASCULAR: Heart RRR no murmurs, rubs or gallops. No peripheral edema  GASTROINTESTINAL: Abdomen is soft, non-tender, not distended w/ normal bowel sounds.  GENITOURINARY: Bladder non tender, not distended  MUSCULOSKELETAL: No abnormal joints or musculature NEUROLOGIC: Cranial nerves 2-12 grossly intact. Moves all extremities PSYCHIATRIC: Mood and affect with dementia, no behavioral issues  Patient Active Problem List   Diagnosis Date Noted  . Osteoporosis 03/18/2016  .  Depression 12/23/2015  . Chronic diastolic heart failure (HCC) 09/05/2015  . Right leg pain 08/05/2015  . COPD (chronic obstructive pulmonary disease) (HCC) 06/25/2015  . Loss of weight 01/21/2015  . Anemia, iron deficiency 01/05/2015  . Acute upper respiratory infection 01/05/2015  . Iron deficiency anemia 12/10/2014  . Stomach discomfort 11/30/2014  . Subacute confusional state 11/30/2014  . Acute respiratory failure with hypoxia (HCC) 11/11/2014  . AKI (acute kidney injury) (HCC) 11/11/2014  . Hypernatremia 11/11/2014  . Hypokalemia 11/09/2014  . COPD exacerbation (HCC)   . Acute on chronic diastolic heart failure (HCC)   . Hypoxia   . SOB (shortness of breath)   . Pressure ulcer stage II 11/02/2014  . Palliative care encounter 11/02/2014  . HCAP (healthcare-associated pneumonia) 11/01/2014  . Leukocytosis 10/15/2014  . Closed fracture of part of upper end  of humerus 10/14/2014  . Poor balance 11/25/2013  . History of stroke 11/25/2013  . 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 Myalgia 09/05/2012  . Disequilibrium 09/05/2012  . Hypertension, essential 09/05/2012  . 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 Mobility impaired 07/15/2012  . Physical exam, routine 12/07/2011  . Cerumen impaction 04/04/2011  . Trapezius muscle spasm 04/04/2011  . Hypertensive heart disease with CHF (congestive heart failure) (HCC) 11/30/2010  . Hyperlipidemia 11/30/2010  . CVA (cerebral infarction) 11/30/2010  . Postmenopausal HRT (hormone replacement therapy) 11/30/2010  . Anxiety 11/30/2010  . Ankle weakness 11/30/2010    CMP     Component Value Date/Time   NA 138 04/10/2017   K 4.6 04/10/2017   CL 102 10/13/2015 1505   CO2 22 10/13/2015 1505   GLUCOSE 118 (H) 10/13/2015 1505   BUN 23 (A) 04/10/2017   CREATININE 0.5 04/10/2017   CREATININE 0.79 10/13/2015 1505   CALCIUM 9.1 10/13/2015 1505   PROT 6.3 10/13/2015 1505   ALBUMIN 3.8 10/13/2015 1505   ALBUMIN 3.8 10/13/2015  1505   AST 16 04/10/2017   ALT 15 04/10/2017   ALKPHOS 106 04/10/2017   BILITOT 0.6 10/13/2015 1505   GFRNONAA >60 11/05/2014 0225   GFRAA >60 11/05/2014 0225   Recent Labs    09/28/16 04/10/17  NA 139 138  K 4.4 4.6  BUN 21 23*  CREATININE 0.7 0.5   Recent Labs    09/28/16 04/10/17  AST 17 16  ALT 16 15  ALKPHOS 90 106   Recent Labs    09/28/16 04/10/17  WBC 6.6 6.4  HGB 12.7 14.4  HCT 39 44  PLT 167 187   Recent Labs    09/28/16 04/10/17  CHOL 140 165  LDLCALC 77 95  TRIG 144 138   No results found for: MICROALBUR Lab Results  Component Value Date   TSH 2.60 04/10/2017   Lab Results  Component Value Date   HGBA1C 5.6 04/10/2017   Lab Results  Component Value Date   CHOL 165 04/10/2017   HDL 42 04/10/2017   LDLCALC 95 04/10/2017   LDLDIRECT 190.1 05/06/2014   TRIG 138 04/10/2017   CHOLHDL 7 05/06/2014    Significant Diagnostic Results in last 30 days:  No results found.  Assessment and Plan  Hyperlipidemia LDL 95, HDL 42; reasonable control; continue Lipitor 40 mg by mouth daily  Depression Chronic and stable; continue Remeron 7.5 mg by mouth daily at bedtime and Lexapro 5 mg by mouth daily  Anxiety Controlled; continue Ativan 0.5 mg by mouth every 8 hours scheduled     Thurston Hole D. [name], MD

## 2017-06-09 ENCOUNTER — Encounter: Payer: Self-pay | Admitting: Internal Medicine

## 2017-06-09 NOTE — Assessment & Plan Note (Signed)
Controlled; continue Ativan 0.5 mg by mouth every 8 hours scheduled

## 2017-06-09 NOTE — Assessment & Plan Note (Signed)
Chronic and stable; continue Remeron 7.5 mg by mouth daily at bedtime and Lexapro 5 mg by mouth daily

## 2017-06-09 NOTE — Assessment & Plan Note (Signed)
LDL 95, HDL 42; reasonable control; continue Lipitor 40 mg by mouth daily

## 2017-07-04 ENCOUNTER — Non-Acute Institutional Stay (SKILLED_NURSING_FACILITY): Payer: Medicare Other | Admitting: Internal Medicine

## 2017-07-04 ENCOUNTER — Encounter: Payer: Self-pay | Admitting: Internal Medicine

## 2017-07-04 DIAGNOSIS — F0280 Dementia in other diseases classified elsewhere without behavioral disturbance: Secondary | ICD-10-CM

## 2017-07-04 DIAGNOSIS — G20 Parkinson's disease: Secondary | ICD-10-CM

## 2017-07-04 DIAGNOSIS — D508 Other iron deficiency anemias: Secondary | ICD-10-CM

## 2017-07-04 DIAGNOSIS — M818 Other osteoporosis without current pathological fracture: Secondary | ICD-10-CM

## 2017-07-04 NOTE — Progress Notes (Signed)
Location:  [facility] Room Number: 411P Place of Service:  SNF (31)  Beverly Hanks, MD  Patient Care Team: Beverly Hanks, MD as PCP - General (Internal Medicine)  Extended Emergency Contact Information Primary Emergency Contact: Beverly Black,Beverly Black Address: [address]          [name] [address] [name] Home Phone: [phone] Relation: Daughter Secondary Emergency Contact: Beverly Black [place] Home Phone: [phone] Relation: Daughter    Allergies: Sulfa antibiotics  Chief Complaint  Patient presents with  . Medical Management of Chronic Issues    HPI: Patient is 84 y.o. female who is being seen for routine issues of dementia, vitamin D deficiency, and iron deficiency anemia.  Past Medical History:  Diagnosis Date  . Acute on chronic diastolic heart failure (HCC)   . Aneurysm (HCC)    of the eye  . Anxiety 11/30/2010  . Closed fracture of part of upper end of humerus 10/14/2014  . COPD exacerbation (HCC)   . CVA (cerebral infarction) 11/30/2010  . Decubitus ulcer of sacral region, stage 4 (HCC)   . 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 Depression 12/23/2015  . History of stroke 11/25/2013  . Hyperlipidemia   . Hypertension   . Hypertensive heart disease with CHF (congestive heart failure) (HCC) 11/30/2010  . Iron deficiency anemia 12/10/2014  . Leukocytosis 10/15/2014  . Osteoporosis   . Postmenopausal HRT (hormone replacement therapy) 11/30/2010  . Stroke (HCC) 10/2008  . 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 EYE SURGERY    . PARTIAL HYSTERECTOMY  1968    Allergies as of 07/04/2017      Reactions   Sulfa Antibiotics    Cant remember reaction      Medication List        Accurate as of 07/04/17 11:59 PM. Always use your most recent med list.            amLODipine 10 MG tablet Commonly known as:  NORVASC TAKE 1 TABLET AT BEDTIME   aspirin 81 MG chewable tablet Chew 81 mg by mouth daily.   atenolol 50 MG tablet Commonly known as:  TENORMIN TAKE 1 TABLET TWICE A DAY   atorvastatin 40 MG tablet Commonly known as:  LIPITOR TAKE 1 TABLET DAILY   cholecalciferol 1000 units tablet Commonly known as:  VITAMIN D Take 1,000 Units by mouth daily.   escitalopram 5 MG tablet Commonly known as:  LEXAPRO Take 5 mg by mouth daily.   ezetimibe 10 MG tablet Commonly known as:  ZETIA Take 10 mg by mouth daily.   ferrous sulfate 325 (65 FE) MG tablet Take 325 mg by mouth daily with breakfast.   fexofenadine 180 MG tablet Commonly known as:  ALLEGRA Take 180 mg by mouth daily.   ipratropium-albuterol 0.5-2.5 (3) MG/3ML Soln Commonly known as:  DUONEB Take 3 mLs by nebulization every 6 (six) hours as needed. wheezing/SOB   LORazepam 0.5 MG tablet Commonly known as:  ATIVAN Take 1 tablet (0.5 mg total) by mouth every 8 (eight) hours.   memantine 10 MG tablet Commonly known as:  NAMENDA Take 10 mg by mouth 2 (two) times daily.   mirtazapine 7.5 MG tablet Commonly known as:  REMERON Take 7.5 mg by mouth at bedtime.   oxyCODONE 5 MG immediate release tablet Commonly known as:  Oxy IR/ROXICODONE Take 1 tablet (5 mg total) by mouth every 6 (six) hours as needed for severe pain.   polyethylene glycol packet Commonly known as:  MIRALAX / GLYCOLAX Take 17 g by mouth daily. To be hold for diarrhea   Potassium Chloride ER 20 MEQ Tbcr Take 1 tablet by mouth daily.   sennosides-docusate sodium 8.6-50 MG tablet Commonly known as:  SENOKOT-S Take 2 tablets by mouth at bedtime.   TAB-A-VITE Tabs Take 1 tablet by mouth daily.   traMADol 50 MG tablet Commonly known as:  ULTRAM Give 1 tablet by mouth every 8 hours scheduled for pain       No orders of the defined types were placed in this encounter.   Immunization History   Administered Date(s) Administered  . Influenza,inj,Quad PF,6+ Mos 04/16/2013, 05/06/2014  . Influenza-Unspecified 02/03/2015, 02/08/2016, 03/15/2017  . PPD Test 10/14/2014    Social History   Tobacco Use  . Smoking status: Former Smoker    Packs/day: 0.50    Years: 50.00    Pack years: 25.00  . Smokeless tobacco: Never Used  Substance Use Topics  . Alcohol use: No    Alcohol/week: 0.0 oz    Review of Systems  DATA OBTAINED: from patient, nurse GENERAL:  no fevers, fatigue, appetite changes SKIN: No itching, rash HEENT: No complaint RESPIRATORY: No cough, wheezing, SOB CARDIAC: No chest pain, palpitations, lower extremity edema  GI: No abdominal pain, No N/V/D or constipation, No heartburn or reflux  GU: No dysuria, frequency or urgency, or incontinence  MUSCULOSKELETAL: No unrelieved bone/joint pain NEUROLOGIC: No headache, dizziness  PSYCHIATRIC: No overt anxiety or sadness  Vitals:   07/04/17 1334  BP: (!) 152/78  Pulse: 74  Resp: 18  Temp: 98.5 F (36.9 C)  SpO2: 97%   Body mass index is 24.37 kg/m. Physical Exam  GENERAL APPEARANCE: Alert, conversant, No acute distress  SKIN: No diaphoresis rash HEENT: Unremarkable RESPIRATORY: Breathing is even, unlabored. Lung sounds are clear   CARDIOVASCULAR: Heart RRR no murmurs, rubs or gallops. No peripheral edema  GASTROINTESTINAL: Abdomen is soft, non-tender, not distended w/ normal bowel sounds.  GENITOURINARY: Bladder non tender, not distended  MUSCULOSKELETAL: No abnormal joints or musculature NEUROLOGIC: Cranial nerves 2-12 grossly intact. Moves all extremities PSYCHIATRIC: Mood and affect appropriate to situation with mild dementia, no behavioral issues  Patient Active Problem List   Diagnosis Date Noted  . Osteoporosis 03/18/2016  . Depression 12/23/2015  . Chronic diastolic heart failure (HCC) 09/05/2015  . Right leg pain 08/05/2015  . COPD (chronic obstructive pulmonary disease) (HCC) 06/25/2015   . Loss of weight 01/21/2015  . Anemia, iron deficiency 01/05/2015  . Acute upper respiratory infection 01/05/2015  . Iron deficiency anemia 12/10/2014  . Stomach discomfort 11/30/2014  . Subacute confusional state 11/30/2014  . Acute respiratory failure with hypoxia (HCC) 11/11/2014  . AKI (acute kidney injury) (HCC) 11/11/2014  . Hypernatremia 11/11/2014  . Hypokalemia 11/09/2014  . COPD exacerbation (HCC)   . Acute on chronic diastolic heart failure (HCC)   . Hypoxia   . SOB (shortness of breath)   . Pressure ulcer stage II 11/02/2014  . Palliative care encounter 11/02/2014  . HCAP (healthcare-associated pneumonia) 11/01/2014  . Leukocytosis 10/15/2014  . Closed fracture of part of upper end of humerus 10/14/2014  . Poor balance 11/25/2013  . History of stroke 11/25/2013  . 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 Myalgia 09/05/2012  . Disequilibrium 09/05/2012  . Hypertension, essential 09/05/2012  . Osteoporosis, post-menopausal  07/16/2012  . Mobility impaired 07/15/2012  . Physical exam, routine 12/07/2011  . Cerumen impaction 04/04/2011  . Trapezius muscle spasm 04/04/2011  . Hypertensive heart disease with CHF (congestive heart failure) (HCC) 11/30/2010  . Hyperlipidemia 11/30/2010  . CVA (cerebral infarction) 11/30/2010  . Postmenopausal HRT (hormone replacement therapy) 11/30/2010  . Anxiety 11/30/2010  . Ankle weakness 11/30/2010    CMP     Component Value Date/Time   NA 138 04/10/2017   K 4.6 04/10/2017   CL 102 10/13/2015 1505   CO2 22 10/13/2015 1505   GLUCOSE 118 (H) 10/13/2015 1505   BUN 23 (A) 04/10/2017   CREATININE 0.5 04/10/2017   CREATININE 0.79 10/13/2015 1505   CALCIUM 9.1 10/13/2015 1505   PROT 6.3 10/13/2015 1505   ALBUMIN 3.8 10/13/2015 1505   ALBUMIN 3.8 10/13/2015 1505   AST 16 04/10/2017   ALT 15 04/10/2017   ALKPHOS 106 04/10/2017   BILITOT 0.6 10/13/2015 1505   GFRNONAA >60 11/05/2014 0225   GFRAA >60 11/05/2014 0225    Recent Labs    09/28/16 04/10/17  NA 139 138  K 4.4 4.6  BUN 21 23*  CREATININE 0.7 0.5   Recent Labs    09/28/16 04/10/17  AST 17 16  ALT 16 15  ALKPHOS 90 106   Recent Labs    09/28/16 04/10/17  WBC 6.6 6.4  HGB 12.7 14.4  HCT 39 44  PLT 167 187   Recent Labs    09/28/16 04/10/17  CHOL 140 165  LDLCALC 77 95  TRIG 144 138   No results found for: MICROALBUR Lab Results  Component Value Date   TSH 2.60 04/10/2017   Lab Results  Component Value Date   HGBA1C 5.6 04/10/2017   Lab Results  Component Value Date   CHOL 165 04/10/2017   HDL 42 04/10/2017   LDLCALC 95 04/10/2017   LDLDIRECT 190.1 05/06/2014   TRIG 138 04/10/2017   CHOLHDL 7 05/06/2014    Significant Diagnostic Results in last 30 days:  No results found.  Assessment and Plan  Dementia due to Parkinson's disease without behavioral disturbance (HCC) Stable without any downward progression; continue Namenda 10 mg twice a day  Osteoporosis No fractures; continue vitamin D 1000 units daily  Iron deficiency anemia Hemoglobin 14.4 which is improved from prior, excellent; continue iron 325 mg by mouth daily     Beverly Black D. [name], MD

## 2017-07-08 ENCOUNTER — Encounter: Payer: Self-pay | Admitting: Internal Medicine

## 2017-07-08 NOTE — Assessment & Plan Note (Signed)
No fractures; continue vitamin D 1000 units daily

## 2017-07-08 NOTE — Assessment & Plan Note (Signed)
Hemoglobin 14.4 which is improved from prior, excellent; continue iron 325 mg by mouth daily

## 2017-07-08 NOTE — Assessment & Plan Note (Signed)
Stable without any downward progression; continue Namenda 10 mg twice a day

## 2017-07-30 ENCOUNTER — Non-Acute Institutional Stay (SKILLED_NURSING_FACILITY): Payer: Medicare Other | Admitting: Internal Medicine

## 2017-07-30 ENCOUNTER — Encounter: Payer: Self-pay | Admitting: Internal Medicine

## 2017-07-30 DIAGNOSIS — D508 Other iron deficiency anemias: Secondary | ICD-10-CM | POA: Diagnosis not present

## 2017-07-30 DIAGNOSIS — I5032 Chronic diastolic (congestive) heart failure: Secondary | ICD-10-CM | POA: Diagnosis not present

## 2017-07-30 DIAGNOSIS — F419 Anxiety disorder, unspecified: Secondary | ICD-10-CM

## 2017-07-30 NOTE — Progress Notes (Signed)
Location:  [facility] Room Number: 411P Place of Service:  SNF (31)  [name], MD  Patient Care Team: [name], MD as PCP - General (Internal Medicine)  Extended Emergency Contact Information Primary Emergency Contact: [name],[name] Address: [address]          [name] [address] [name] Home Phone: [phone] Relation: Daughter Secondary Emergency Contact: [name] [place] Home Phone: [phone] Relation: Daughter    Allergies: Sulfa antibiotics  Chief Complaint  Patient presents with  . Medical Management of Chronic Issues    Routine Visit    HPI: Patient is 84 y.o. female who is being seen for routine issues of iron deficiency anemia, anxiety, and chronic diastolic congestive heart failure.  Past Medical History:  Diagnosis Date  . Acute on chronic diastolic heart failure (HCC)   . Aneurysm (HCC)    of the eye  . Anxiety 11/30/2010  . Closed fracture of part of upper end of humerus 10/14/2014  . COPD exacerbation (HCC)   . CVA (cerebral infarction) 11/30/2010  . Decubitus ulcer of sacral region, stage 4 (HCC)   . 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 Depression 12/23/2015  . History of stroke 11/25/2013  . Hyperlipidemia   . Hypertension   . Hypertensive heart disease with CHF (congestive heart failure) (HCC) 11/30/2010  . Iron deficiency anemia 12/10/2014  . Leukocytosis 10/15/2014  . Osteoporosis   . Postmenopausal HRT (hormone replacement therapy) 11/30/2010  . Stroke (HCC) 10/2008  . 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 EYE SURGERY    . PARTIAL HYSTERECTOMY  1968    Allergies as of 07/30/2017      Reactions   Sulfa Antibiotics    Cant remember reaction      Medication List        Accurate as of 07/30/17 11:59 PM. Always use your  most recent med list.          amLODipine 10 MG tablet Commonly known as:  NORVASC TAKE 1 TABLET AT BEDTIME   aspirin 81 MG chewable tablet Chew 81 mg by mouth daily.   atenolol 50 MG tablet Commonly known as:  TENORMIN TAKE 1 TABLET TWICE A DAY   atorvastatin 40 MG tablet Commonly known as:  LIPITOR TAKE 1 TABLET DAILY   cholecalciferol 1000 units tablet Commonly known as:  VITAMIN D Take 1,000 Units by mouth daily.   escitalopram 5 MG tablet Commonly known as:  LEXAPRO Take 5 mg by mouth daily.   ezetimibe 10 MG tablet Commonly known as:  ZETIA Take 10 mg by mouth daily.   ferrous sulfate 325 (65 FE) MG tablet Take 325 mg by mouth daily with breakfast.   fexofenadine 180 MG tablet Commonly known as:  ALLEGRA Take 180 mg by mouth daily.   ipratropium-albuterol 0.5-2.5 (3) MG/3ML Soln Commonly known as:  DUONEB Take 3 mLs by nebulization every 6 (six) hours as needed. wheezing/SOB   LORazepam 0.5 MG tablet Commonly known as:  ATIVAN Take 1 tablet (0.5 mg total) by mouth every 8 (eight) hours.   memantine 10 MG tablet Commonly known as:  NAMENDA Take 10 mg by mouth 2 (two) times daily.   mirtazapine 7.5 MG tablet Commonly known as:  REMERON Take 7.5 mg by mouth at bedtime.   oxyCODONE 5 MG immediate  release tablet Commonly known as:  Oxy IR/ROXICODONE Take 1 tablet (5 mg total) by mouth every 6 (six) hours as needed for severe pain.   polyethylene glycol packet Commonly known as:  MIRALAX / GLYCOLAX Take 17 g by mouth daily. To be hold for diarrhea   sennosides-docusate sodium 8.6-50 MG tablet Commonly known as:  SENOKOT-S Take 2 tablets by mouth at bedtime.   TAB-A-VITE Tabs Take 1 tablet by mouth daily.   traMADol 50 MG tablet Commonly known as:  ULTRAM Give 1 tablet by mouth every 8 hours scheduled for pain       No orders of the defined types were placed in this encounter.   Immunization History  Administered Date(s) Administered  .  Influenza,inj,Quad PF,6+ Mos 04/16/2013, 05/06/2014  . Influenza-Unspecified 02/03/2015, 02/08/2016, 03/15/2017  . PPD Test 10/14/2014    Social History   Tobacco Use  . Smoking status: Former Smoker    Packs/day: 0.50    Years: 50.00    Pack years: 25.00  . Smokeless tobacco: Never Used  Substance Use Topics  . Alcohol use: No    Alcohol/week: 0.0 oz    Review of Systems  DATA OBTAINED: from patient, nurse GENERAL:  no fevers, fatigue, appetite changes SKIN: No itching, rash HEENT: No complaint RESPIRATORY: No cough, wheezing, SOB CARDIAC: No chest pain, palpitations, lower extremity edema  GI: No abdominal pain, No N/V/D or constipation, No heartburn or reflux  GU: No dysuria, frequency or urgency, or incontinence  MUSCULOSKELETAL: No unrelieved bone/joint pain NEUROLOGIC: No headache, dizziness  PSYCHIATRIC: No overt anxiety or sadness  Vitals:   07/30/17 1130  BP: 132/78  Pulse: 66  Resp: 18  Temp: 97.6 F (36.4 C)  SpO2: 95%   Body mass index is 24.85 kg/m. Physical Exam  GENERAL APPEARANCE: Alert, conversant, No acute distress  SKIN: No diaphoresis rash HEENT: Unremarkable RESPIRATORY: Breathing is even, unlabored. Lung sounds are clear   CARDIOVASCULAR: Heart RRR no murmurs, rubs or gallops. No peripheral edema  GASTROINTESTINAL: Abdomen is soft, non-tender, not distended w/ normal bowel sounds.  GENITOURINARY: Bladder non tender, not distended  MUSCULOSKELETAL: No abnormal joints or musculature NEUROLOGIC: Cranial nerves 2-12 grossly intact. Moves all extremities PSYCHIATRIC: Mood and affect appropriate to situation with dementia, no behavioral issues  Patient Active Problem List   Diagnosis Date Noted  . Osteoporosis 03/18/2016  . Depression 12/23/2015  . Chronic diastolic heart failure (HCC) 09/05/2015  . Right leg pain 08/05/2015  . COPD (chronic obstructive pulmonary disease) (HCC) 06/25/2015  . Loss of weight 01/21/2015  . Anemia, iron  deficiency 01/05/2015  . Acute upper respiratory infection 01/05/2015  . Iron deficiency anemia 12/10/2014  . Stomach discomfort 11/30/2014  . Subacute confusional state 11/30/2014  . Acute respiratory failure with hypoxia (HCC) 11/11/2014  . AKI (acute kidney injury) (HCC) 11/11/2014  . Hypernatremia 11/11/2014  . Hypokalemia 11/09/2014  . COPD exacerbation (HCC)   . Acute on chronic diastolic heart failure (HCC)   . Hypoxia   . SOB (shortness of breath)   . Pressure ulcer stage II 11/02/2014  . Palliative care encounter 11/02/2014  . HCAP (healthcare-associated pneumonia) 11/01/2014  . Leukocytosis 10/15/2014  . Closed fracture of part of upper end of humerus 10/14/2014  . Poor balance 11/25/2013  . History of stroke 11/25/2013  . 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 Myalgia 09/05/2012  . Disequilibrium 09/05/2012  . Hypertension, essential 09/05/2012  . 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 Mobility impaired 07/15/2012  . Physical exam,  routine 12/07/2011  . Cerumen impaction 04/04/2011  . Trapezius muscle spasm 04/04/2011  . Hypertensive heart disease with CHF (congestive heart failure) (HCC) 11/30/2010  . Hyperlipidemia 11/30/2010  . CVA (cerebral infarction) 11/30/2010  . Postmenopausal HRT (hormone replacement therapy) 11/30/2010  . Anxiety 11/30/2010  . Ankle weakness 11/30/2010    CMP     Component Value Date/Time   NA 138 04/10/2017   K 4.6 04/10/2017   CL 102 10/13/2015 1505   CO2 22 10/13/2015 1505   GLUCOSE 118 (H) 10/13/2015 1505   BUN 23 (A) 04/10/2017   CREATININE 0.5 04/10/2017   CREATININE 0.79 10/13/2015 1505   CALCIUM 9.1 10/13/2015 1505   PROT 6.3 10/13/2015 1505   ALBUMIN 3.8 10/13/2015 1505   ALBUMIN 3.8 10/13/2015 1505   AST 16 04/10/2017   ALT 15 04/10/2017   ALKPHOS 106 04/10/2017   BILITOT 0.6 10/13/2015 1505   GFRNONAA >60 11/05/2014 0225   GFRAA >60 11/05/2014 0225   Recent Labs    09/28/16 04/10/17  NA 139 138  K  4.4 4.6  BUN 21 23*  CREATININE 0.7 0.5   Recent Labs    09/28/16 04/10/17  AST 17 16  ALT 16 15  ALKPHOS 90 106   Recent Labs    09/28/16 04/10/17  WBC 6.6 6.4  HGB 12.7 14.4  HCT 39 44  PLT 167 187   Recent Labs    09/28/16 04/10/17  CHOL 140 165  LDLCALC 77 95  TRIG 144 138   No results found for: MICROALBUR Lab Results  Component Value Date   TSH 2.60 04/10/2017   Lab Results  Component Value Date   HGBA1C 5.6 04/10/2017   Lab Results  Component Value Date   CHOL 165 04/10/2017   HDL 42 04/10/2017   LDLCALC 95 04/10/2017   LDLDIRECT 190.1 05/06/2014   TRIG 138 04/10/2017   CHOLHDL 7 05/06/2014    Significant Diagnostic Results in last 30 days:  No results found.  Assessment and Plan  Anemia, iron deficiency Most recent hemoglobin 14.4 which is excellent; continue iron 325 mg daily  Anxiety No reported problems; continue Ativan 0.5 mg every 8 hours  Chronic diastolic heart failure (HCC) Stable; continue Tenormin 50 mg twice daily, patient is not on a diuretic    Leannah Guse D. [name], MD

## 2017-08-04 ENCOUNTER — Encounter: Payer: Self-pay | Admitting: Internal Medicine

## 2017-08-04 NOTE — Assessment & Plan Note (Signed)
Most recent hemoglobin 14.4 which is excellent; continue iron 325 mg daily

## 2017-08-04 NOTE — Assessment & Plan Note (Signed)
No reported problems; continue Ativan 0.5 mg every 8 hours

## 2017-08-04 NOTE — Assessment & Plan Note (Signed)
Stable; continue Tenormin 50 mg twice daily, patient is not on a diuretic

## 2017-09-04 ENCOUNTER — Encounter: Payer: Self-pay | Admitting: Internal Medicine

## 2017-09-04 ENCOUNTER — Non-Acute Institutional Stay (SKILLED_NURSING_FACILITY): Payer: Medicare Other | Admitting: Internal Medicine

## 2017-09-04 DIAGNOSIS — I110 Hypertensive heart disease with heart failure: Secondary | ICD-10-CM

## 2017-09-04 DIAGNOSIS — E782 Mixed hyperlipidemia: Secondary | ICD-10-CM

## 2017-09-04 DIAGNOSIS — F32A Depression, unspecified: Secondary | ICD-10-CM

## 2017-09-04 DIAGNOSIS — I5032 Chronic diastolic (congestive) heart failure: Secondary | ICD-10-CM

## 2017-09-04 DIAGNOSIS — F329 Major depressive disorder, single episode, unspecified: Secondary | ICD-10-CM | POA: Diagnosis not present

## 2017-09-04 NOTE — Progress Notes (Signed)
Location:  [facility] Room Number: 411P Place of Service:  SNF (31)  [name], MD  Patient Care Team: [name], MD as PCP - General (Internal Medicine)  Extended Emergency Contact Information Primary Emergency Contact: [name],[name] Address: [address]          [name] [address] [name] Home Phone: [phone] Relation: Daughter Secondary Emergency Contact: [name] [place] Home Phone: [phone] Relation: Daughter    Allergies: Sulfa antibiotics  Chief Complaint  Patient presents with  . Medical Management of Chronic Issues    Routine Visit    HPI: Patient is 84 y.o. female who is being seen for routine issues of hypertension, hyperlipidemia, and depression.  Past Medical History:  Diagnosis Date  . Acute on chronic diastolic heart failure (HCC)   . Aneurysm (HCC)    of the eye  . Anxiety 11/30/2010  . Closed fracture of part of upper end of humerus 10/14/2014  . COPD exacerbation (HCC)   . CVA (cerebral infarction) 11/30/2010  . Decubitus ulcer of sacral region, stage 4 (HCC)   . 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 Depression 12/23/2015  . History of stroke 11/25/2013  . Hyperlipidemia   . Hypertension   . Hypertensive heart disease with CHF (congestive heart failure) (HCC) 11/30/2010  . Iron deficiency anemia 12/10/2014  . Leukocytosis 10/15/2014  . Osteoporosis   . Postmenopausal HRT (hormone replacement therapy) 11/30/2010  . Stroke (HCC) 10/2008  . 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 EYE SURGERY    . PARTIAL HYSTERECTOMY  1968    Allergies as of 09/04/2017      Reactions   Sulfa Antibiotics    Cant remember reaction      Medication List        Accurate as of 09/04/17 11:59 PM. Always use your most recent med list.            amLODipine 10 MG tablet Commonly known as:  NORVASC TAKE 1 TABLET AT BEDTIME   aspirin 81 MG chewable tablet Chew 81 mg by mouth daily.   atenolol 50 MG tablet Commonly known as:  TENORMIN TAKE 1 TABLET TWICE A DAY   atorvastatin 40 MG tablet Commonly known as:  LIPITOR TAKE 1 TABLET DAILY   cholecalciferol 1000 units tablet Commonly known as:  VITAMIN D Take 1,000 Units by mouth daily.   escitalopram 10 MG tablet Commonly known as:  LEXAPRO Take 10 mg by mouth daily.   ezetimibe 10 MG tablet Commonly known as:  ZETIA Take 10 mg by mouth daily.   ferrous sulfate 325 (65 FE) MG tablet Take 325 mg by mouth daily with breakfast.   fexofenadine 180 MG tablet Commonly known as:  ALLEGRA Take 180 mg by mouth daily.   ipratropium-albuterol 0.5-2.5 (3) MG/3ML Soln Commonly known as:  DUONEB Take 3 mLs by nebulization every 6 (six) hours as needed. wheezing/SOB   LORazepam 0.5 MG tablet Commonly known as:  ATIVAN Take 1 tablet (0.5 mg total) by mouth every 8 (eight) hours.   memantine 10 MG tablet Commonly known as:  NAMENDA Take 10 mg by mouth 2 (two) times daily.   mirtazapine 7.5 MG tablet Commonly known as:  REMERON Take 7.5 mg by mouth at bedtime.   oxyCODONE 5 MG immediate release tablet Commonly known as:  Oxy IR/ROXICODONE Take 1 tablet (5 mg total) by mouth every 6 (six) hours as needed for severe pain.   polyethylene glycol packet Commonly known as:  MIRALAX / GLYCOLAX Take 17 g by mouth daily. To be hold for diarrhea   potassium chloride SA 20 MEQ tablet Commonly known as:  K-DUR,KLOR-CON Take 20 mEq by mouth daily.   sennosides-docusate sodium 8.6-50 MG tablet Commonly known as:  SENOKOT-S Take 2 tablets by mouth at bedtime.   TAB-A-VITE Tabs Take 1 tablet by mouth daily.   traMADol 50 MG tablet Commonly known as:  ULTRAM Give 1 tablet by mouth every 8 hours scheduled for pain       No orders of the defined types were placed in this  encounter.   Immunization History  Administered Date(s) Administered  . Influenza,inj,Quad PF,6+ Mos 04/16/2013, 05/06/2014  . Influenza-Unspecified 02/03/2015, 02/08/2016, 03/15/2017  . PPD Test 10/14/2014    Social History   Tobacco Use  . Smoking status: Former Smoker    Packs/day: 0.50    Years: 50.00    Pack years: 25.00  . Smokeless tobacco: Never Used  Substance Use Topics  . Alcohol use: No    Alcohol/week: 0.0 oz    Review of Systems  DATA OBTAINED: from patient, nurse GENERAL:  no fevers, fatigue, appetite changes SKIN: No itching, rash HEENT: No complaint RESPIRATORY: No cough, wheezing, SOB CARDIAC: No chest pain, palpitations, lower extremity edema  GI: No abdominal pain, No N/V/D or constipation, No heartburn or reflux  GU: No dysuria, frequency or urgency, or incontinence  MUSCULOSKELETAL: No unrelieved bone/joint pain NEUROLOGIC: No headache, dizziness  PSYCHIATRIC: No overt anxiety or sadness  Vitals:   09/04/17 0816  BP: (!) 147/81  Pulse: 66  Resp: 20  Temp: 98.2 F (36.8 C)  SpO2: 95%   Body mass index is 24.75 kg/m. Physical Exam  GENERAL APPEARANCE: Alert, conversant, No acute distress  SKIN: No diaphoresis rash HEENT: Unremarkable RESPIRATORY: Breathing is even, unlabored. Lung sounds are clear   CARDIOVASCULAR: Heart RRR no murmurs, rubs or gallops. No peripheral edema  GASTROINTESTINAL: Abdomen is soft, non-tender, not distended w/ normal bowel sounds.  GENITOURINARY: Bladder non tender, not distended  MUSCULOSKELETAL: No abnormal joints or musculature NEUROLOGIC: Cranial nerves 2-12 grossly intact. Moves all extremities PSYCHIATRIC: Mood and affect appropriate to situation with dementia, no behavioral issues  Patient Active Problem List   Diagnosis Date Noted  . Osteoporosis 03/18/2016  . Depression 12/23/2015  . Chronic diastolic heart failure (HCC) 09/05/2015  . Right leg pain 08/05/2015  . COPD (chronic obstructive  pulmonary disease) (HCC) 06/25/2015  . Loss of weight 01/21/2015  . Anemia, iron deficiency 01/05/2015  . Acute upper respiratory infection 01/05/2015  . Iron deficiency anemia 12/10/2014  . Stomach discomfort 11/30/2014  . Subacute confusional state 11/30/2014  . Acute respiratory failure with hypoxia (HCC) 11/11/2014  . AKI (acute kidney injury) (HCC) 11/11/2014  . Hypernatremia 11/11/2014  . Hypokalemia 11/09/2014  . COPD exacerbation (HCC)   . Acute on chronic diastolic heart failure (HCC)   . Hypoxia   . SOB (shortness of breath)   . Pressure ulcer stage II 11/02/2014  . Palliative care encounter 11/02/2014  . HCAP (healthcare-associated pneumonia) 11/01/2014  . Leukocytosis 10/15/2014  . Closed fracture of part of upper end of humerus 10/14/2014  . Poor balance 11/25/2013  . History of stroke 11/25/2013  . 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 Myalgia 09/05/2012  . Disequilibrium 09/05/2012  . Hypertension, essential 09/05/2012  .  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 Mobility impaired 07/15/2012  . Physical exam, routine 12/07/2011  . Cerumen impaction 04/04/2011  . Trapezius muscle spasm 04/04/2011  . Hypertensive heart disease with CHF (congestive heart failure) (HCC) 11/30/2010  . Hyperlipidemia 11/30/2010  . CVA (cerebral infarction) 11/30/2010  . Postmenopausal HRT (hormone replacement therapy) 11/30/2010  . Anxiety 11/30/2010  . Ankle weakness 11/30/2010    CMP     Component Value Date/Time   NA 138 04/10/2017   K 4.6 04/10/2017   CL 102 10/13/2015 1505   CO2 22 10/13/2015 1505   GLUCOSE 118 (H) 10/13/2015 1505   BUN 23 (A) 04/10/2017   CREATININE 0.5 04/10/2017   CREATININE 0.79 10/13/2015 1505   CALCIUM 9.1 10/13/2015 1505   PROT 6.3 10/13/2015 1505   ALBUMIN 3.8 10/13/2015 1505   ALBUMIN 3.8 10/13/2015 1505   AST 16 04/10/2017   ALT 15 04/10/2017   ALKPHOS 106 04/10/2017   BILITOT 0.6 10/13/2015 1505   GFRNONAA >60 11/05/2014 0225    GFRAA >60 11/05/2014 0225   Recent Labs    04/10/17  NA 138  K 4.6  BUN 23*  CREATININE 0.5   Recent Labs    04/10/17  AST 16  ALT 15  ALKPHOS 106   Recent Labs    04/10/17  WBC 6.4  HGB 14.4  HCT 44  PLT 187   Recent Labs    04/10/17  CHOL 165  LDLCALC 95  TRIG 138   No results found for: MICROALBUR Lab Results  Component Value Date   TSH 2.60 04/10/2017   Lab Results  Component Value Date   HGBA1C 5.6 04/10/2017   Lab Results  Component Value Date   CHOL 165 04/10/2017   HDL 42 04/10/2017   LDLCALC 95 04/10/2017   LDLDIRECT 190.1 05/06/2014   TRIG 138 04/10/2017   CHOLHDL 7 05/06/2014    Significant Diagnostic Results in last 30 days:  No results found.  Assessment and Plan  Hypertensive heart disease with CHF (congestive heart failure) (HCC) Controlled for age; continue Tenormin 50 mg twice daily and Norvasc 10 mg daily  Hyperlipidemia LDL 95, HDL 42; controlled enough for age; plan to continue Lipitor 40 mg daily and Zetia 10 mg daily  Depression Appears controlled; patient now on Lexapro 10 mg and continues on Remeron 7.5 mg nightly; continue current medications     Jenella Craigie D. [name], MD

## 2017-09-29 ENCOUNTER — Encounter: Payer: Self-pay | Admitting: Internal Medicine

## 2017-09-29 NOTE — Assessment & Plan Note (Signed)
LDL 95, HDL 42; controlled enough for age; plan to continue Lipitor 40 mg daily and Zetia 10 mg daily

## 2017-09-29 NOTE — Assessment & Plan Note (Signed)
Controlled for age; continue Tenormin 50 mg twice daily and Norvasc 10 mg daily

## 2017-09-29 NOTE — Assessment & Plan Note (Signed)
Appears controlled; patient now on Lexapro 10 mg and continues on Remeron 7.5 mg nightly; continue current medications

## 2017-10-03 ENCOUNTER — Non-Acute Institutional Stay (SKILLED_NURSING_FACILITY): Payer: Medicare Other | Admitting: Internal Medicine

## 2017-10-03 ENCOUNTER — Encounter: Payer: Self-pay | Admitting: Internal Medicine

## 2017-10-03 DIAGNOSIS — D508 Other iron deficiency anemias: Secondary | ICD-10-CM | POA: Diagnosis not present

## 2017-10-03 DIAGNOSIS — F419 Anxiety disorder, unspecified: Secondary | ICD-10-CM

## 2017-10-03 DIAGNOSIS — J42 Unspecified chronic bronchitis: Secondary | ICD-10-CM

## 2017-10-03 NOTE — Progress Notes (Signed)
Location:  [facility] Room Number: 411P Place of Service:  SNF (31)  [name], MD  Patient Care Team: [name], MD as PCP - General (Internal Medicine)  Extended Emergency Contact Information Primary Emergency Contact: Beverly Black Address: [address]          [place] [address] [place] Home Phone: [phone] Relation: Daughter Secondary Emergency Contact: [place] Home Phone: [phone] Relation: Daughter    Allergies: Sulfa antibiotics  Chief Complaint  Patient presents with  . Medical Management of Chronic Issues    Routine Visit    HPI: Patient is 84 y.o. female who is being seen for routine issues of anxiety, iron deficiency anemia, and COPD.  Past Medical History:  Diagnosis Date  . Acute on chronic diastolic heart failure (HCC)   . Aneurysm (HCC)    of the eye  . Anxiety 11/30/2010  . Closed fracture of part of upper end of humerus 10/14/2014  . COPD exacerbation (HCC)   . CVA (cerebral infarction) 11/30/2010  . Decubitus ulcer of sacral region, stage 4 (HCC)   . 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 Depression 12/23/2015  . History of stroke 11/25/2013  . Hyperlipidemia   . Hypertension   . Hypertensive heart disease with CHF (congestive heart failure) (HCC) 11/30/2010  . Iron deficiency anemia 12/10/2014  . Leukocytosis 10/15/2014  . Osteoporosis   . Postmenopausal HRT (hormone replacement therapy) 11/30/2010  . Stroke (HCC) 10/2008  . 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 EYE SURGERY    . PARTIAL HYSTERECTOMY  1968    Allergies as of 10/03/2017      Reactions   Sulfa Antibiotics    Cant remember reaction      Medication List        Accurate as of 10/03/17 11:59 PM. Always use your most recent med list.            amLODipine 10 MG tablet Commonly known as:  NORVASC TAKE 1 TABLET AT BEDTIME   aspirin 81 MG chewable tablet Chew 81 mg by mouth daily.   atenolol 50 MG tablet Commonly known as:  TENORMIN TAKE 1 TABLET TWICE A DAY   atorvastatin 40 MG tablet Commonly known as:  LIPITOR TAKE 1 TABLET DAILY   cholecalciferol 1000 units tablet Commonly known as:  VITAMIN D Take 1,000 Units by mouth daily.   escitalopram 10 MG tablet Commonly known as:  LEXAPRO Take 15 mg by mouth daily.   ezetimibe 10 MG tablet Commonly known as:  ZETIA Take 10 mg by mouth daily.   ferrous sulfate 325 (65 FE) MG tablet Take 325 mg by mouth daily with breakfast.   fexofenadine 180 MG tablet Commonly known as:  ALLEGRA Take 180 mg by mouth daily.   ipratropium-albuterol 0.5-2.5 (3) MG/3ML Soln Commonly known as:  DUONEB Take 3 mLs by nebulization every 6 (six) hours as needed. wheezing/SOB   LORazepam 0.5 MG tablet Commonly known as:  ATIVAN Take 1 tablet (0.5 mg total) by mouth every 8 (eight) hours.   memantine 10 MG tablet Commonly known as:  NAMENDA Take 10 mg by mouth 2 (two) times daily.   mirtazapine 7.5 MG tablet Commonly known as:  REMERON Take 7.5 mg by mouth at bedtime.   oxyCODONE 5 MG immediate release tablet Commonly  known as:  Oxy IR/ROXICODONE Take 1 tablet (5 mg total) by mouth every 6 (six) hours as needed for severe pain.   polyethylene glycol packet Commonly known as:  MIRALAX / GLYCOLAX Take 17 g by mouth daily. To be hold for diarrhea   potassium chloride SA 20 MEQ tablet Commonly known as:  K-DUR,KLOR-CON Take 20 mEq by mouth daily.   sennosides-docusate sodium 8.6-50 MG tablet Commonly known as:  SENOKOT-S Take 2 tablets by mouth at bedtime.   TAB-A-VITE Tabs Take 1 tablet by mouth daily.   traMADol 50 MG tablet Commonly known as:  ULTRAM Give 1 tablet by mouth every 8 hours scheduled for pain       No orders of the defined types were placed in this  encounter.   Immunization History  Administered Date(s) Administered  . Influenza,inj,Quad PF,6+ Mos 04/16/2013, 05/06/2014  . Influenza-Unspecified 02/03/2015, 02/08/2016, 03/15/2017  . PPD Test 10/14/2014    Social History   Tobacco Use  . Smoking status: Former Smoker    Packs/day: 0.50    Years: 50.00    Pack years: 25.00  . Smokeless tobacco: Never Used  Substance Use Topics  . Alcohol use: No    Alcohol/week: 0.0 oz    Review of Systems  DATA OBTAINED: from patient GENERAL:  no fevers, fatigue, appetite changes SKIN: No itching, rash HEENT: No complaint RESPIRATORY: No cough, wheezing, SOB CARDIAC: No chest pain, palpitations, lower extremity edema  GI: No abdominal pain, No N/V/D or constipation, No heartburn or reflux  GU: No dysuria, frequency or urgency, or incontinence  MUSCULOSKELETAL: No unrelieved bone/joint pain NEUROLOGIC: No headache, dizziness  PSYCHIATRIC: No overt anxiety or sadness  Vitals:   10/03/17 1402  BP: (!) 130/91  Pulse: (!) 58  Resp: 18  Temp: 98.3 F (36.8 C)  SpO2: 97%   Body mass index is 24.58 kg/m. Physical Exam  GENERAL APPEARANCE: Alert, conversant, No acute distress  SKIN: No diaphoresis rash HEENT: Unremarkable RESPIRATORY: Breathing is even, unlabored. Lung sounds are clear   CARDIOVASCULAR: Heart RRR no murmurs, rubs or gallops. No peripheral edema  GASTROINTESTINAL: Abdomen is soft, non-tender, not distended w/ normal bowel sounds.  GENITOURINARY: Bladder non tender, not distended  MUSCULOSKELETAL: No abnormal joints or musculature NEUROLOGIC: Cranial nerves 2-12 grossly intact. Moves all extremities PSYCHIATRIC: Mood and affect appropriate to situation with some dementia, no behavioral issues  Patient Active Problem List   Diagnosis Date Noted  . Osteoporosis 03/18/2016  . Depression 12/23/2015  . Chronic diastolic heart failure (HCC) 09/05/2015  . Right leg pain 08/05/2015  . COPD (chronic obstructive  pulmonary disease) (HCC) 06/25/2015  . Loss of weight 01/21/2015  . Anemia, iron deficiency 01/05/2015  . Acute upper respiratory infection 01/05/2015  . Iron deficiency anemia 12/10/2014  . Stomach discomfort 11/30/2014  . Subacute confusional state 11/30/2014  . Acute respiratory failure with hypoxia (HCC) 11/11/2014  . AKI (acute kidney injury) (HCC) 11/11/2014  . Hypernatremia 11/11/2014  . Hypokalemia 11/09/2014  . COPD exacerbation (HCC)   . Acute on chronic diastolic heart failure (HCC)   . Hypoxia   . SOB (shortness of breath)   . Pressure ulcer stage II 11/02/2014  . Palliative care encounter 11/02/2014  . HCAP (healthcare-associated pneumonia) 11/01/2014  . Leukocytosis 10/15/2014  . Closed fracture of part of upper end of humerus 10/14/2014  . Poor balance 11/25/2013  . History of stroke 11/25/2013  . 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 Myalgia 09/05/2012  . Disequilibrium 09/05/2012  .  Hypertension, essential 09/05/2012  . 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 Mobility impaired 07/15/2012  . Physical exam, routine 12/07/2011  . Cerumen impaction 04/04/2011  . Trapezius muscle spasm 04/04/2011  . Hypertensive heart disease with CHF (congestive heart failure) (HCC) 11/30/2010  . Hyperlipidemia 11/30/2010  . CVA (cerebral infarction) 11/30/2010  . Postmenopausal HRT (hormone replacement therapy) 11/30/2010  . Anxiety 11/30/2010  . Ankle weakness 11/30/2010    CMP     Component Value Date/Time   NA 139 10/06/2017   K 4.7 10/06/2017   CL 102 10/13/2015 1505   CO2 22 10/13/2015 1505   GLUCOSE 118 (H) 10/13/2015 1505   BUN 20 10/06/2017   CREATININE 0.6 10/06/2017   CREATININE 0.79 10/13/2015 1505   CALCIUM 9.1 10/13/2015 1505   PROT 6.3 10/13/2015 1505   ALBUMIN 3.8 10/13/2015 1505   ALBUMIN 3.8 10/13/2015 1505   AST 23 10/06/2017   ALT 22 10/06/2017   ALKPHOS 125 10/06/2017   BILITOT 0.6 10/13/2015 1505   GFRNONAA >60 11/05/2014 0225    GFRAA >60 11/05/2014 0225   Recent Labs    04/10/17 10/06/17  NA 138 139  K 4.6 4.7  BUN 23* 20  CREATININE 0.5 0.6   Recent Labs    04/10/17 10/06/17  AST 16 23  ALT 15 22  ALKPHOS 106 125   Recent Labs    04/10/17 10/06/17  WBC 6.4 6.8  HGB 14.4 14.6  HCT 44 43  PLT 187 200   Recent Labs    04/10/17  CHOL 165  LDLCALC 95  TRIG 138   No results found for: MICROALBUR Lab Results  Component Value Date   TSH 2.60 04/10/2017   Lab Results  Component Value Date   HGBA1C 5.6 04/10/2017   Lab Results  Component Value Date   CHOL 165 04/10/2017   HDL 42 04/10/2017   LDLCALC 95 04/10/2017   LDLDIRECT 190.1 05/06/2014   TRIG 138 04/10/2017   CHOLHDL 7 05/06/2014    Significant Diagnostic Results in last 30 days:  No results found.  Assessment and Plan  Anxiety Patient appears content and happy; continue Ativan 0.5 mg 3 times daily  Anemia, iron deficiency Recent hemoglobin 14.6; continue iron 325 mg p.o. daily  COPD (chronic obstructive pulmonary disease) (HCC) No reported exacerbation; patient is controlled on Allegra 180 mg daily and as needed albuterol   [name]. [name], MD

## 2017-10-06 LAB — BASIC METABOLIC PANEL
BUN: 20 (ref 4–21)
Creatinine: 0.6 (ref 0.5–1.1)
Glucose: 125
Potassium: 4.7 (ref 3.4–5.3)
SODIUM: 139 (ref 137–147)

## 2017-10-06 LAB — VITAMIN D 25 HYDROXY (VIT D DEFICIENCY, FRACTURES): Vit D, 25-Hydroxy: 32.1

## 2017-10-06 LAB — HEPATIC FUNCTION PANEL
ALK PHOS: 125 (ref 25–125)
ALT: 22 (ref 7–35)
AST: 23 (ref 13–35)
BILIRUBIN, TOTAL: 0.4

## 2017-10-06 LAB — CBC AND DIFFERENTIAL
HCT: 43 (ref 36–46)
HEMOGLOBIN: 14.6 (ref 12.0–16.0)
Platelets: 200 (ref 150–399)
WBC: 6.8

## 2017-10-29 ENCOUNTER — Encounter: Payer: Self-pay | Admitting: Internal Medicine

## 2017-10-29 ENCOUNTER — Non-Acute Institutional Stay (SKILLED_NURSING_FACILITY): Payer: Medicare Other | Admitting: Internal Medicine

## 2017-10-29 DIAGNOSIS — F0280 Dementia in other diseases classified elsewhere without behavioral disturbance: Secondary | ICD-10-CM

## 2017-10-29 DIAGNOSIS — G20 Parkinson's disease: Secondary | ICD-10-CM

## 2017-10-29 DIAGNOSIS — M818 Other osteoporosis without current pathological fracture: Secondary | ICD-10-CM

## 2017-10-29 DIAGNOSIS — D508 Other iron deficiency anemias: Secondary | ICD-10-CM | POA: Diagnosis not present

## 2017-10-29 NOTE — Progress Notes (Signed)
Location:  [facility] Room Number: [id number] Place of Service:  SNF ([phone])  Beverly Hanks, MD  Patient Care Team: Beverly Hanks, MD as PCP - General (Internal Medicine)  Extended Emergency Contact Information Primary Emergency Contact: [name],[name] Address: [address]          [name] [address] [name] Home Phone: [phone] Relation: Daughter Secondary Emergency Contact: [name] [place] Home Phone: [phone] Relation: Daughter    Allergies: Sulfa antibiotics  Chief Complaint  Patient presents with  . Medical Management of Chronic Issues    HPI: Patient is 84 y.o. female who is being seen for routine issues of osteoporosis, dementia, and iron deficiency anemia.  Past Medical History:  Diagnosis Date  . Acute on chronic diastolic heart failure (HCC)   . Aneurysm (HCC)    of the eye  . Anxiety 11/30/2010  . Closed fracture of part of upper end of humerus 10/14/2014  . COPD exacerbation (HCC)   . CVA (cerebral infarction) 11/30/2010  . Decubitus ulcer of sacral region, stage 4 (HCC)   . 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 Depression 12/23/2015  . History of stroke 11/25/2013  . Hyperlipidemia   . Hypertension   . Hypertensive heart disease with CHF (congestive heart failure) (HCC) 11/30/2010  . Iron deficiency anemia 12/10/2014  . Leukocytosis 10/15/2014  . Osteoporosis   . Postmenopausal HRT (hormone replacement therapy) 11/30/2010  . Stroke (HCC) 10/2008  . 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 EYE SURGERY    . PARTIAL HYSTERECTOMY  1968    Allergies as of 10/29/2017      Reactions   Sulfa Antibiotics    Cant remember reaction      Medication List        Accurate as of 10/29/17 11:59 PM. Always use your most recent med list.          amLODipine 10  MG tablet Commonly known as:  NORVASC TAKE 1 TABLET AT BEDTIME   aspirin 81 MG chewable tablet Chew 81 mg by mouth daily.   atenolol 50 MG tablet Commonly known as:  TENORMIN TAKE 1 TABLET TWICE A DAY   atorvastatin 40 MG tablet Commonly known as:  LIPITOR TAKE 1 TABLET DAILY   cholecalciferol 1000 units tablet Commonly known as:  VITAMIN D Take 1,000 Units by mouth daily.   escitalopram 10 MG tablet Commonly known as:  LEXAPRO Take 15 mg by mouth daily.   ezetimibe 10 MG tablet Commonly known as:  ZETIA Take 10 mg by mouth daily.   ferrous sulfate 325 (65 FE) MG tablet Take 325 mg by mouth daily with breakfast.   fexofenadine 180 MG tablet Commonly known as:  ALLEGRA Take 180 mg by mouth daily.   ipratropium-albuterol 0.5-2.5 (3) MG/3ML Soln Commonly known as:  DUONEB Take 3 mLs by nebulization every 6 (six) hours as needed. wheezing/SOB   LORazepam 0.5 MG tablet Commonly known as:  ATIVAN Take 1 tablet (0.5 mg total) by mouth every 8 (eight) hours.   memantine 10 MG tablet Commonly known as:  NAMENDA Take 10 mg by mouth 2 (two) times daily.   mirtazapine 7.5 MG tablet Commonly known as:  REMERON Take 7.5 mg by mouth at bedtime.   oxyCODONE 5 MG immediate release tablet Commonly known as:  Oxy IR/ROXICODONE Take  1 tablet (5 mg total) by mouth every 6 (six) hours as needed for severe pain.   polyethylene glycol packet Commonly known as:  MIRALAX / GLYCOLAX Take 17 g by mouth daily. To be hold for diarrhea   potassium chloride SA 20 MEQ tablet Commonly known as:  K-DUR,KLOR-CON Take 20 mEq by mouth daily.   sennosides-docusate sodium 8.6-50 MG tablet Commonly known as:  SENOKOT-S Take 2 tablets by mouth at bedtime.   SYSTANE 0.4-0.3 % Soln Generic drug:  Polyethyl Glycol-Propyl Glycol Apply 2 drops to eye 2 (two) times daily.   TAB-A-VITE Tabs Take 1 tablet by mouth daily.   traMADol 50 MG tablet Commonly known as:  ULTRAM Give 1 tablet by  mouth every 8 hours scheduled for pain       No orders of the defined types were placed in this encounter.   Immunization History  Administered Date(s) Administered  . Influenza,inj,Quad PF,6+ Mos 04/16/2013, 05/06/2014  . Influenza-Unspecified 02/03/2015, 02/08/2016, 03/15/2017  . PPD Test 10/14/2014    Social History   Tobacco Use  . Smoking status: Former Smoker    Packs/day: 0.50    Years: 50.00    Pack years: 25.00  . Smokeless tobacco: Never Used  Substance Use Topics  . Alcohol use: No    Alcohol/week: 0.0 oz    Review of Systems  DATA OBTAINED: from patient, nurse GENERAL:  no fevers, fatigue, appetite changes SKIN: No itching, rash HEENT: No complaint RESPIRATORY: No cough, wheezing, SOB CARDIAC: No chest pain, palpitations, lower extremity edema  GI: No abdominal pain, No N/V/D or constipation, No heartburn or reflux  GU: No dysuria, frequency or urgency, or incontinence  MUSCULOSKELETAL: No unrelieved bone/joint pain NEUROLOGIC: No headache, dizziness  PSYCHIATRIC: No overt anxiety or sadness  Vitals:   10/29/17 1335  BP: 132/86  Pulse: 60  Resp: 17  Temp: (!) 96.9 F (36.1 C)  SpO2: 93%   Body mass index is 24.72 kg/m. Physical Exam  GENERAL APPEARANCE: Alert, conversant, No acute distress  SKIN: No diaphoresis rash HEENT: Unremarkable RESPIRATORY: Breathing is even, unlabored. Lung sounds are clear   CARDIOVASCULAR: Heart RRR no murmurs, rubs or gallops. No peripheral edema  GASTROINTESTINAL: Abdomen is soft, non-tender, not distended w/ normal bowel sounds.  GENITOURINARY: Bladder non tender, not distended  MUSCULOSKELETAL: No abnormal joints or musculature NEUROLOGIC: Cranial nerves 2-12 grossly intact. Moves all extremities PSYCHIATRIC: Mood and affect appropriate to situation, no behavioral issues  Patient Active Problem List   Diagnosis Date Noted  . Osteoporosis 03/18/2016  . Depression 12/23/2015  . Chronic diastolic heart  failure (HCC) 16/02/9603  . Right leg pain 08/05/2015  . COPD (chronic obstructive pulmonary disease) (HCC) 06/25/2015  . Loss of weight 01/21/2015  . Anemia, iron deficiency 01/05/2015  . Acute upper respiratory infection 01/05/2015  . Iron deficiency anemia 12/10/2014  . Stomach discomfort 11/30/2014  . Subacute confusional state 11/30/2014  . Acute respiratory failure with hypoxia (HCC) 11/11/2014  . AKI (acute kidney injury) (HCC) 11/11/2014  . Hypernatremia 11/11/2014  . Hypokalemia 11/09/2014  . COPD exacerbation (HCC)   . Acute on chronic diastolic heart failure (HCC)   . Hypoxia   . SOB (shortness of breath)   . Pressure ulcer stage II 11/02/2014  . Palliative care encounter 11/02/2014  . HCAP (healthcare-associated pneumonia) 11/01/2014  . Leukocytosis 10/15/2014  . Closed fracture of part of upper end of humerus 10/14/2014  . Poor balance 11/25/2013  . History of stroke 11/25/2013  . Vascular  parkinsonism (HCC) 11/25/2013  . Myalgia 09/05/2012  . Disequilibrium 09/05/2012  . Hypertension, essential 09/05/2012  . 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 Mobility impaired 07/15/2012  . Physical exam, routine 12/07/2011  . Cerumen impaction 04/04/2011  . Trapezius muscle spasm 04/04/2011  . Hypertensive heart disease with CHF (congestive heart failure) (HCC) 11/30/2010  . Hyperlipidemia 11/30/2010  . CVA (cerebral infarction) 11/30/2010  . Postmenopausal HRT (hormone replacement therapy) 11/30/2010  . Anxiety 11/30/2010  . Ankle weakness 11/30/2010    CMP     Component Value Date/Time   NA 139 10/06/2017   K 4.7 10/06/2017   CL 102 10/13/2015 1505   CO2 22 10/13/2015 1505   GLUCOSE 118 (H) 10/13/2015 1505   BUN 20 10/06/2017   CREATININE 0.6 10/06/2017   CREATININE 0.79 10/13/2015 1505   CALCIUM 9.1 10/13/2015 1505   PROT 6.3 10/13/2015 1505   ALBUMIN 3.8 10/13/2015 1505   ALBUMIN 3.8 10/13/2015 1505   AST 23 10/06/2017   ALT 22 10/06/2017   ALKPHOS  125 10/06/2017   BILITOT 0.6 10/13/2015 1505   GFRNONAA >60 11/05/2014 0225   GFRAA >60 11/05/2014 0225   Recent Labs    04/10/17 10/06/17  NA 138 139  K 4.6 4.7  BUN 23* 20  CREATININE 0.5 0.6   Recent Labs    04/10/17 10/06/17  AST 16 23  ALT 15 22  ALKPHOS 106 125   Recent Labs    04/10/17 10/06/17  WBC 6.4 6.8  HGB 14.4 14.6  HCT 44 43  PLT 187 200   Recent Labs    04/10/17  CHOL 165  LDLCALC 95  TRIG 138   No results found for: MICROALBUR Lab Results  Component Value Date   TSH 2.60 04/10/2017   Lab Results  Component Value Date   HGBA1C 5.6 04/10/2017   Lab Results  Component Value Date   CHOL 165 04/10/2017   HDL 42 04/10/2017   LDLCALC 95 04/10/2017   LDLDIRECT 190.1 05/06/2014   TRIG 138 04/10/2017   CHOLHDL 7 05/06/2014    Significant Diagnostic Results in last 30 days:  No results found.  Assessment and Plan  Osteoporosis Continues without fracture; continue vitamin D at thousand units daily  Dementia due to Parkinson's disease without behavioral disturbance (HCC) Stable without decline; continue Namenda 10 mg twice daily  Iron deficiency anemia Hemoglobin 14.6 which is excellent; plan to continue iron 325 mg daily    Beverly Delancey D. Lyn HollingsheadAlexander, MD

## 2017-10-30 ENCOUNTER — Encounter: Payer: Self-pay | Admitting: Internal Medicine

## 2017-10-30 NOTE — Assessment & Plan Note (Signed)
Recent hemoglobin 14.6; continue iron 325 mg p.o. daily

## 2017-10-30 NOTE — Assessment & Plan Note (Signed)
Patient appears content and happy; continue Ativan 0.5 mg 3 times daily

## 2017-10-30 NOTE — Assessment & Plan Note (Signed)
No reported exacerbation; patient is controlled on Allegra 180 mg daily and as needed albuterol

## 2017-11-17 ENCOUNTER — Encounter: Payer: Self-pay | Admitting: Internal Medicine

## 2017-11-17 NOTE — Assessment & Plan Note (Signed)
Stable without decline; continue Namenda 10 mg twice daily

## 2017-11-17 NOTE — Assessment & Plan Note (Signed)
Hemoglobin 14.6 which is excellent; plan to continue iron 325 mg daily

## 2017-11-17 NOTE — Assessment & Plan Note (Signed)
Continues without fracture; continue vitamin D at thousand units daily

## 2017-11-22 ENCOUNTER — Non-Acute Institutional Stay (SKILLED_NURSING_FACILITY): Payer: Medicare Other | Admitting: Internal Medicine

## 2017-11-22 ENCOUNTER — Encounter: Payer: Self-pay | Admitting: Internal Medicine

## 2017-11-22 DIAGNOSIS — I110 Hypertensive heart disease with heart failure: Secondary | ICD-10-CM

## 2017-11-22 DIAGNOSIS — E782 Mixed hyperlipidemia: Secondary | ICD-10-CM | POA: Diagnosis not present

## 2017-11-22 DIAGNOSIS — I5032 Chronic diastolic (congestive) heart failure: Secondary | ICD-10-CM | POA: Diagnosis not present

## 2017-11-22 NOTE — Progress Notes (Signed)
Location:  [facility] Room Number: 411P Place of Service:  SNF ([phone])  [name]. [name], MD  Patient Care Team: [name], MD as PCP - General (Internal Medicine)  Extended Emergency Contact Information Primary Emergency Contact: [name],[name] Address: [address]          [name] [address] [name] Home Phone: [phone] Relation: Daughter Secondary Emergency Contact: [name] [place] Home Phone: [phone] Relation: Daughter    Allergies: Sulfa antibiotics  Chief Complaint  Patient presents with  . Medical Management of Chronic Issues    Routine Visit    HPI: Patient is 84 y.o. female who is being seen for routine issues of hypertension, hyperlipidemia, and chronic diastolic congestive heart failure.  Past Medical History:  Diagnosis Date  . Acute on chronic diastolic heart failure (HCC)   . Aneurysm (HCC)    of the eye  . Anxiety 11/30/2010  . Closed fracture of part of upper end of humerus 10/14/2014  . COPD exacerbation (HCC)   . CVA (cerebral infarction) 11/30/2010  . Decubitus ulcer of sacral region, stage 4 (HCC)   . 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 Depression 12/23/2015  . History of stroke 11/25/2013  . Hyperlipidemia   . Hypertension   . Hypertensive heart disease with CHF (congestive heart failure) (HCC) 11/30/2010  . Iron deficiency anemia 12/10/2014  . Leukocytosis 10/15/2014  . Osteoporosis   . Postmenopausal HRT (hormone replacement therapy) 11/30/2010  . Stroke (HCC) 10/2008  . 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 EYE SURGERY    . PARTIAL HYSTERECTOMY  1968    Allergies as of 11/22/2017      Reactions   Sulfa Antibiotics    Cant remember reaction      Medication List        Accurate as of 11/22/17 11:59 PM. Always use your most  recent med list.          amLODipine 10 MG tablet Commonly known as:  NORVASC TAKE 1 TABLET AT BEDTIME   aspirin 81 MG chewable tablet Chew 81 mg by mouth daily.   atenolol 50 MG tablet Commonly known as:  TENORMIN TAKE 1 TABLET TWICE A DAY   atorvastatin 40 MG tablet Commonly known as:  LIPITOR TAKE 1 TABLET DAILY   cholecalciferol 1000 units tablet Commonly known as:  VITAMIN D Take 1,000 Units by mouth daily.   escitalopram 10 MG tablet Commonly known as:  LEXAPRO Take 15 mg by mouth daily.   ezetimibe 10 MG tablet Commonly known as:  ZETIA Take 10 mg by mouth daily.   ferrous sulfate 325 (65 FE) MG tablet Take 325 mg by mouth daily with breakfast.   fexofenadine 180 MG tablet Commonly known as:  ALLEGRA Take 180 mg by mouth daily.   ipratropium-albuterol 0.5-2.5 (3) MG/3ML Soln Commonly known as:  DUONEB Take 3 mLs by nebulization every 6 (six) hours as needed. wheezing/SOB   LORazepam 0.5 MG tablet Commonly known as:  ATIVAN Take 1 tablet (0.5 mg total) by mouth every 8 (eight) hours.   memantine 10 MG tablet Commonly known as:  NAMENDA Take 10 mg by mouth 2 (two) times daily.   mirtazapine 7.5 MG tablet Commonly known as:  REMERON Take 7.5 mg by mouth at bedtime.   oxyCODONE 5 MG immediate release  tablet Commonly known as:  Oxy IR/ROXICODONE Take 1 tablet (5 mg total) by mouth every 6 (six) hours as needed for severe pain.   polyethylene glycol packet Commonly known as:  MIRALAX / GLYCOLAX Take 17 g by mouth daily. To be hold for diarrhea   potassium chloride SA 20 MEQ tablet Commonly known as:  K-DUR,KLOR-CON Take 20 mEq by mouth daily.   sennosides-docusate sodium 8.6-50 MG tablet Commonly known as:  SENOKOT-S Take 2 tablets by mouth at bedtime.   SYSTANE 0.4-0.3 % Soln Generic drug:  Polyethyl Glycol-Propyl Glycol Apply 2 drops to eye 2 (two) times daily.   TAB-A-VITE Tabs Take 1 tablet by mouth daily.   traMADol 50 MG  tablet Commonly known as:  ULTRAM Give 1 tablet by mouth every 8 hours scheduled for pain       No orders of the defined types were placed in this encounter.   Immunization History  Administered Date(s) Administered  . Influenza,inj,Quad PF,6+ Mos 04/16/2013, 05/06/2014  . Influenza-Unspecified 02/03/2015, 02/08/2016, 03/15/2017  . PPD Test 10/14/2014    Social History   Tobacco Use  . Smoking status: Former Smoker    Packs/day: 0.50    Years: 50.00    Pack years: 25.00  . Smokeless tobacco: Never Used  Substance Use Topics  . Alcohol use: No    Alcohol/week: 0.0 standard drinks    Review of Systems  DATA OBTAINED: from patient, nurse GENERAL:  no fevers, fatigue, appetite changes SKIN: No itching, rash HEENT: No complaint RESPIRATORY: No cough, wheezing, SOB CARDIAC: No chest pain, palpitations, lower extremity edema  GI: No abdominal pain, No N/V/D or constipation, No heartburn or reflux  GU: No dysuria, frequency or urgency, or incontinence  MUSCULOSKELETAL: No unrelieved bone/joint pain NEUROLOGIC: No headache, dizziness  PSYCHIATRIC: No overt anxiety or sadness  Vitals:   11/22/17 1517  BP: (!) 145/71  Pulse: 67  Resp: 17  Temp: (!) 97.5 F (36.4 C)   Body mass index is 25.08 kg/m. Physical Exam  GENERAL APPEARANCE: Alert, conversant, No acute distress  SKIN: No diaphoresis rash HEENT: Unremarkable RESPIRATORY: Breathing is even, unlabored. Lung sounds are clear   CARDIOVASCULAR: Heart RRR no murmurs, rubs or gallops. No peripheral edema  GASTROINTESTINAL: Abdomen is soft, non-tender, not distended w/ normal bowel sounds.  GENITOURINARY: Bladder non tender, not distended  MUSCULOSKELETAL: No abnormal joints or musculature NEUROLOGIC: Cranial nerves 2-12 grossly intact. Moves all extremities PSYCHIATRIC: Mood and affect appropriate to situation, no behavioral issues  Patient Active Problem List   Diagnosis Date Noted  . Osteoporosis 03/18/2016   . Depression 12/23/2015  . Chronic diastolic heart failure (HCC) 09/05/2015  . Right leg pain 08/05/2015  . COPD (chronic obstructive pulmonary disease) (HCC) 06/25/2015  . Loss of weight 01/21/2015  . Anemia, iron deficiency 01/05/2015  . Acute upper respiratory infection 01/05/2015  . Iron deficiency anemia 12/10/2014  . Stomach discomfort 11/30/2014  . Subacute confusional state 11/30/2014  . Acute respiratory failure with hypoxia (HCC) 11/11/2014  . AKI (acute kidney injury) (HCC) 11/11/2014  . Hypernatremia 11/11/2014  . Hypokalemia 11/09/2014  . COPD exacerbation (HCC)   . Acute on chronic diastolic heart failure (HCC)   . Hypoxia   . SOB (shortness of breath)   . Pressure ulcer stage II 11/02/2014  . Palliative care encounter 11/02/2014  . HCAP (healthcare-associated pneumonia) 11/01/2014  . Leukocytosis 10/15/2014  . Closed fracture of part of upper end of humerus 10/14/2014  . Poor balance 11/25/2013  .  History of stroke 11/25/2013  . 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 Myalgia 09/05/2012  . Disequilibrium 09/05/2012  . Hypertension, essential 09/05/2012  . 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 Mobility impaired 07/15/2012  . Physical exam, routine 12/07/2011  . Cerumen impaction 04/04/2011  . Trapezius muscle spasm 04/04/2011  . Hypertensive heart disease with CHF (congestive heart failure) (HCC) 11/30/2010  . Hyperlipidemia 11/30/2010  . CVA (cerebral infarction) 11/30/2010  . Postmenopausal HRT (hormone replacement therapy) 11/30/2010  . Anxiety 11/30/2010  . Ankle weakness 11/30/2010    CMP     Component Value Date/Time   NA 139 10/06/2017   K 4.7 10/06/2017   CL 102 10/13/2015 1505   CO2 22 10/13/2015 1505   GLUCOSE 118 (H) 10/13/2015 1505   BUN 20 10/06/2017   CREATININE 0.6 10/06/2017   CREATININE 0.79 10/13/2015 1505   CALCIUM 9.1 10/13/2015 1505   PROT 6.3 10/13/2015 1505   ALBUMIN 3.8 10/13/2015 1505   ALBUMIN 3.8 10/13/2015  1505   AST 23 10/06/2017   ALT 22 10/06/2017   ALKPHOS 125 10/06/2017   BILITOT 0.6 10/13/2015 1505   GFRNONAA >60 11/05/2014 0225   GFRAA >60 11/05/2014 0225   Recent Labs    04/10/17 10/06/17  NA 138 139  K 4.6 4.7  BUN 23* 20  CREATININE 0.5 0.6   Recent Labs    04/10/17 10/06/17  AST 16 23  ALT 15 22  ALKPHOS 106 125   Recent Labs    04/10/17 10/06/17  WBC 6.4 6.8  HGB 14.4 14.6  HCT 44 43  PLT 187 200   Recent Labs    04/10/17  CHOL 165  LDLCALC 95  TRIG 138   No results found for: MICROALBUR Lab Results  Component Value Date   TSH 2.60 04/10/2017   Lab Results  Component Value Date   HGBA1C 5.6 04/10/2017   Lab Results  Component Value Date   CHOL 165 04/10/2017   HDL 42 04/10/2017   LDLCALC 95 04/10/2017   LDLDIRECT 190.1 05/06/2014   TRIG 138 04/10/2017   CHOLHDL 7 05/06/2014    Significant Diagnostic Results in last 30 days:  No results found.  Assessment and Plan  Hypertensive heart disease with CHF (congestive heart failure) (HCC) Controlled; continue Norvasc 10 mg daily and Tenormin 50 mg twice daily  Hyperlipidemia Controlled for age; continue Zetia 10 mg daily and Lipitor 40 mg daily  Chronic diastolic heart failure (HCC) Controlled without exacerbation; continue atenolol 50 mg twice daily and ASA 81 mg daily; patient is not on diuretic; will follow weights    Ashwini Jago D. [name], MD

## 2017-11-30 ENCOUNTER — Non-Acute Institutional Stay (SKILLED_NURSING_FACILITY): Payer: Medicare Other

## 2017-11-30 DIAGNOSIS — Z0000 Encounter for general adult medical examination without abnormal findings: Secondary | ICD-10-CM | POA: Diagnosis not present

## 2017-11-30 NOTE — Patient Instructions (Signed)
Beverly Black , Thank you for taking time to come for your Medicare Wellness Visit. I appreciate your ongoing commitment to your health goals. Please review the following plan we discussed and let me know if I can assist you in the future.   Screening recommendations/referrals: Colonoscopy excluded, over age 82 Mammogram excluded, over age 82 Bone Density up to date Recommended yearly ophthalmology/optometry visit for glaucoma screening and checkup Recommended yearly dental visit for hygiene and checkup  Vaccinations: Influenza vaccine up to date, due 2019 fall season Pneumococcal vaccine 13 due, ordered Tdap vaccine due, ordered Shingles vaccine not in past records    Advanced directives: in chart  Conditions/risks identified: none  Next appointment: Dr. Lyn HollingsheadAlexander makes rounds   Preventive Care 65 Years and Older, Female Preventive care refers to lifestyle choices and visits with your health care provider that can promote health and wellness. What does preventive care include?  A yearly physical exam. This is also called an annual well check.  Dental exams once or twice a year.  Routine eye exams. Ask your health care provider how often you should have your eyes checked.  Personal lifestyle choices, including:  Daily care of your teeth and gums.  Regular physical activity.  Eating a healthy diet.  Avoiding tobacco and drug use.  Limiting alcohol use.  Practicing safe sex.  Taking low-dose aspirin every day.  Taking vitamin and mineral supplements as recommended by your health care provider. What happens during an annual well check? The services and screenings done by your health care provider during your annual well check will depend on your age, overall health, lifestyle risk factors, and family history of disease. Counseling  Your health care provider may ask you questions about your:  Alcohol use.  Tobacco use.  Drug use.  Emotional well-being.  Home  and relationship well-being.  Sexual activity.  Eating habits.  History of falls.  Memory and ability to understand (cognition).  Work and work Astronomerenvironment.  Reproductive health. Screening  You may have the following tests or measurements:  Height, weight, and BMI.  Blood pressure.  Lipid and cholesterol levels. These may be checked every 5 years, or more frequently if you are over 82 years old.  Skin check.  Lung cancer screening. You may have this screening every year starting at age 82 if you have a 30-pack-year history of smoking and currently smoke or have quit within the past 15 years.  Fecal occult blood test (FOBT) of the stool. You may have this test every year starting at age 82.  Flexible sigmoidoscopy or colonoscopy. You may have a sigmoidoscopy every 5 years or a colonoscopy every 10 years starting at age 82.  Hepatitis C blood test.  Hepatitis B blood test.  Sexually transmitted disease (STD) testing.  Diabetes screening. This is done by checking your blood sugar (glucose) after you have not eaten for a while (fasting). You may have this done every 1-3 years.  Bone density scan. This is done to screen for osteoporosis. You may have this done starting at age 82.  Mammogram. This may be done every 1-2 years. Talk to your health care provider about how often you should have regular mammograms. Talk with your health care provider about your test results, treatment options, and if necessary, the need for more tests. Vaccines  Your health care provider may recommend certain vaccines, such as:  Influenza vaccine. This is recommended every year.  Tetanus, diphtheria, and acellular pertussis (Tdap, Td) vaccine. You may  need a Td booster every 10 years.  Zoster vaccine. You may need this after age 34.  Pneumococcal 13-valent conjugate (PCV13) vaccine. One dose is recommended after age 57.  Pneumococcal polysaccharide (PPSV23) vaccine. One dose is recommended  after age 21. Talk to your health care provider about which screenings and vaccines you need and how often you need them. This information is not intended to replace advice given to you by your health care provider. Make sure you discuss any questions you have with your health care provider. Document Released: 05/21/2015 Document Revised: 01/12/2016 Document Reviewed: 02/23/2015 Elsevier Interactive Patient Education  2017 [employer] Prevention in the Home Falls can cause injuries. They can happen to people of all ages. There are many things you can do to make your home safe and to help prevent falls. What can I do on the outside of my home?  Regularly fix the edges of walkways and driveways and fix any cracks.  Remove anything that might make you trip as you walk through a door, such as a raised step or threshold.  Trim any bushes or trees on the path to your home.  Use bright outdoor lighting.  Clear any walking paths of anything that might make someone trip, such as rocks or tools.  Regularly check to see if handrails are loose or broken. Make sure that both sides of any steps have handrails.  Any raised decks and porches should have guardrails on the edges.  Have any leaves, snow, or ice cleared regularly.  Use sand or salt on walking paths during winter.  Clean up any spills in your garage right away. This includes oil or grease spills. What can I do in the bathroom?  Use night lights.  Install grab bars by the toilet and in the tub and shower. Do not use towel bars as grab bars.  Use non-skid mats or decals in the tub or shower.  If you need to sit down in the shower, use a plastic, non-slip stool.  Keep the floor dry. Clean up any water that spills on the floor as soon as it happens.  Remove soap buildup in the tub or shower regularly.  Attach bath mats securely with double-sided non-slip rug tape.  Do not have throw rugs and other things on the floor  that can make you trip. What can I do in the bedroom?  Use night lights.  Make sure that you have a light by your bed that is easy to reach.  Do not use any sheets or blankets that are too big for your bed. They should not hang down onto the floor.  Have a firm chair that has side arms. You can use this for support while you get dressed.  Do not have throw rugs and other things on the floor that can make you trip. What can I do in the kitchen?  Clean up any spills right away.  Avoid walking on wet floors.  Keep items that you use a lot in easy-to-reach places.  If you need to reach something above you, use a strong step stool that has a grab bar.  Keep electrical cords out of the way.  Do not use floor polish or wax that makes floors slippery. If you must use wax, use non-skid floor wax.  Do not have throw rugs and other things on the floor that can make you trip. What can I do with my stairs?  Do not leave any items on  the stairs.  Make sure that there are handrails on both sides of the stairs and use them. Fix handrails that are broken or loose. Make sure that handrails are as long as the stairways.  Check any carpeting to make sure that it is firmly attached to the stairs. Fix any carpet that is loose or worn.  Avoid having throw rugs at the top or bottom of the stairs. If you do have throw rugs, attach them to the floor with carpet tape.  Make sure that you have a light switch at the top of the stairs and the bottom of the stairs. If you do not have them, ask someone to add them for you. What else can I do to help prevent falls?  Wear shoes that:  Do not have high heels.  Have rubber bottoms.  Are comfortable and fit you well.  Are closed at the toe. Do not wear sandals.  If you use a stepladder:  Make sure that it is fully opened. Do not climb a closed stepladder.  Make sure that both sides of the stepladder are locked into place.  Ask someone to hold it  for you, if possible.  Clearly mark and make sure that you can see:  Any grab bars or handrails.  First and last steps.  Where the edge of each step is.  Use tools that help you move around (mobility aids) if they are needed. These include:  Canes.  Walkers.  Scooters.  Crutches.  Turn on the lights when you go into a dark area. Replace any light bulbs as soon as they burn out.  Set up your furniture so you have a clear path. Avoid moving your furniture around.  If any of your floors are uneven, fix them.  If there are any pets around you, be aware of where they are.  Review your medicines with your doctor. Some medicines can make you feel dizzy. This can increase your chance of falling. Ask your doctor what other things that you can do to help prevent falls. This information is not intended to replace advice given to you by your health care provider. Make sure you discuss any questions you have with your health care provider. Document Released: 02/18/2009 Document Revised: 09/30/2015 Document Reviewed: 05/29/2014 Elsevier Interactive Patient Education  2017 [employer].

## 2017-11-30 NOTE — Progress Notes (Signed)
Subjective:   Beverly Black is a 84 y.o. female who presents for Medicare Annual (Subsequent) preventive examination at [facility]  Last AWV-11/29/2016    Objective:     Vitals: BP 120/78 (BP Location: Right Arm, Patient Position: Sitting)   Pulse 67   Temp 98.1 F (36.7 C) (Oral)   Ht 5\' 4"  (1.626 m)   Wt 146 lb (66.2 kg)   SpO2 (!) 85%   BMI 25.06 kg/m   Body mass index is 25.06 kg/m.  Advanced Directives 11/30/2017 10/29/2017 10/03/2017 09/04/2017 07/30/2017 07/04/2017 06/05/2017  Does Patient Have a Medical Advance Directive? Yes Yes No No Yes Yes Yes  Type of Advance Directive Out of facility DNR (pink MOST or yellow form) Out of facility DNR (pink MOST or yellow form) - - Out of facility DNR (pink MOST or yellow form) Out of facility DNR (pink MOST or yellow form) Out of facility DNR (pink MOST or yellow form)  Does patient want to make changes to medical advance directive? No - Patient declined - - - No - Patient declined - No - Patient declined  Copy of Healthcare Power of Attorney in Chart? - - - - - - -  Would patient like information on creating a medical advance directive? No - Patient declined - No - Patient declined - - - -  Pre-existing out of facility DNR order (yellow form or pink MOST form) Yellow form placed in chart (order not valid for inpatient use) Yellow form placed in chart (order not valid for inpatient use) - - Yellow form placed in chart (order not valid for inpatient use) - Yellow form placed in chart (order not valid for inpatient use)    Tobacco Social History   Tobacco Use  Smoking Status Former Smoker  . Packs/day: 0.50  . Years: 50.00  . Pack years: 25.00  Smokeless Tobacco Never Used     Counseling given: Not Answered   Clinical Intake:  Pre-visit preparation completed: No  Pain : No/denies pain     Nutritional Risks: None Diabetes: No  How often do you need to have someone help you when you read instructions,  pamphlets, or other written materials from your doctor or pharmacy?: 2 - Rarely What is the last grade level you completed in school?: High school  Interpreter Needed?: No  Information entered by :: Tyron Russell, RN  Past Medical History:  Diagnosis Date  . Acute on chronic diastolic heart failure (HCC)   . Aneurysm (HCC)    of the eye  . Anxiety 11/30/2010  . Closed fracture of part of upper end of humerus 10/14/2014  . COPD exacerbation (HCC)   . CVA (cerebral infarction) 11/30/2010  . Decubitus ulcer of sacral region, stage 4 (HCC)   . 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 Depression 12/23/2015  . History of stroke 11/25/2013  . Hyperlipidemia   . Hypertension   . Hypertensive heart disease with CHF (congestive heart failure) (HCC) 11/30/2010  . Iron deficiency anemia 12/10/2014  . Leukocytosis 10/15/2014  . Osteoporosis   . Postmenopausal HRT (hormone replacement therapy) 11/30/2010  . Stroke (HCC) 10/2008  . 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 EYE SURGERY    . PARTIAL HYSTERECTOMY  1968   Family History  Problem Relation Age of Onset  . Stroke Father   . Heart failure Mother  Social History   Socioeconomic History  . Marital status: Widowed    Spouse name: Not on file  . Number of children: 3  . Years of education: HS  . Highest education level: Not on file  Occupational History  . Occupation: housewife  Social Needs  . Financial resource strain: Not hard at all  . Food insecurity:    Worry: Never true    Inability: Never true  . Transportation needs:    Medical: No    Non-medical: No  Tobacco Use  . Smoking status: Former Smoker    Packs/day: 0.50    Years: 50.00    Pack years: 25.00  . Smokeless tobacco: Never Used  Substance and Sexual Activity  . Alcohol use: No    Alcohol/week: 0.0 oz  . Drug use: No  . Sexual activity: Never    Lifestyle  . Physical activity:    Days per week: 0 days    Minutes per session: 0 min  . Stress: Not at all  Relationships  . Social connections:    Talks on phone: Twice a week    Gets together: Twice a week    Attends religious service: Never    Active member of club or organization: No    Attends meetings of clubs or organizations: Never    Relationship status: Widowed  Other Topics Concern  . Not on file  Social History Narrative   Ad[facility] 10/13/14   Widowed   Former smoker   Alcohol none   Full Code    Outpatient Encounter Medications as of 11/30/2017  Medication Sig  . amLODipine (NORVASC) 10 MG tablet TAKE 1 TABLET AT BEDTIME  . aspirin 81 MG chewable tablet Chew 81 mg by mouth daily.  [name]. atenolol (TENORMIN) 50 MG tablet TAKE 1 TABLET TWICE A DAY  . atorvastatin (LIPITOR) 40 MG tablet TAKE 1 TABLET DAILY  . cholecalciferol (VITAMIN D) 1000 UNITS tablet Take 1,000 Units by mouth daily.  [name]. escitalopram (LEXAPRO) 10 MG tablet Take 15 mg by mouth daily.   [name]. ezetimibe (ZETIA) 10 MG tablet Take 10 mg by mouth daily.   . ferrous sulfate 325 (65 FE) MG tablet Take 325 mg by mouth daily with breakfast.  . fexofenadine (ALLEGRA) 180 MG tablet Take 180 mg by mouth daily.  [name]. ipratropium-albuterol (DUONEB) 0.5-2.5 (3) MG/3ML SOLN Take 3 mLs by nebulization every 6 (six) hours as needed. wheezing/SOB  . LORazepam (ATIVAN) 0.5 MG tablet Take 1 tablet (0.5 mg total) by mouth every 8 (eight) hours.  . memantine (NAMENDA) 10 MG tablet Take 10 mg by mouth 2 (two) times daily.  . mirtazapine (REMERON) 7.5 MG tablet Take 7.5 mg by mouth at bedtime.   . Multiple Vitamin (TAB-A-VITE) TABS Take 1 tablet by mouth daily.  [name]. oxyCODONE (OXY IR/ROXICODONE) 5 MG immediate release tablet Take 1 tablet (5 mg total) by mouth every 6 (six) hours as needed for severe pain.  Bertram Gala. Polyethyl Glycol-Propyl Glycol (SYSTANE) 0.4-0.3 % SOLN Apply 2 drops to eye 2 (two) times daily.   . polyethylene glycol (MIRALAX / GLYCOLAX) packet Take 17 g by mouth daily. To be hold for diarrhea  . potassium chloride SA (K-DUR,KLOR-CON) 20 MEQ tablet Take 20 mEq by mouth daily.  . sennosides-docusate sodium (SENOKOT-S) 8.6-50 MG tablet Take 2 tablets by mouth at bedtime.   . traMADol (ULTRAM) 50 MG tablet Give 1 tablet by mouth every 8 hours scheduled for pain  No facility-administered encounter medications on file as of 11/30/2017.     Activities of Daily Living In your present state of health, do you have any difficulty performing the following activities: 11/30/2017  Hearing? N  Vision? N  Difficulty concentrating or making decisions? Y  Walking or climbing stairs? Y  Dressing or bathing? Y  Doing errands, shopping? Y  Preparing Food and eating ? Y  Using the Toilet? Y  In the past six months, have you accidently leaked urine? Y  Do you have problems with loss of bowel control? Y  Managing your Medications? Y  Managing your Finances? Y  Housekeeping or managing your Housekeeping? Y  Some recent data might be hidden    Patient Care Team: [name], MD as PCP - General (Internal Medicine)    Assessment:   This is a routine wellness examination for Nakshatra.  Exercise Activities and Dietary recommendations Current Exercise Habits: The patient does not participate in regular exercise at present, Exercise limited by: orthopedic condition(s)  Goals    None      Fall Risk Fall Risk  11/30/2017 11/29/2016 05/06/2014 04/16/2013  Falls in the past year? No No Yes Yes  Number falls in past yr: - - 2 or more 1  Injury with Fall? - - - No  Risk Factor Category  - - High Fall Risk -  Risk for fall due to : - - History of fall(s);Impaired balance/gait -   Is the patient's home free of loose throw rugs in walkways, pet beds, electrical cords, etc?   yes      Grab bars in the bathroom? yes      Handrails on the stairs?   yes      Adequate lighting?   yes   Depression  Screen PHQ 2/9 Scores 11/30/2017 11/29/2016 05/06/2014 04/16/2013  PHQ - 2 Score 0 0 0 0     Cognitive Function     6CIT Screen 11/30/2017 11/29/2016  What Year? 4 points 0 points  What month? 0 points 0 points  What time? 0 points 0 points  Count back from 20 0 points 0 points  Months in reverse 4 points 0 points  Repeat phrase 6 points 10 points  Total Score 14 10    Immunization History  Administered Date(s) Administered  . Influenza,inj,Quad PF,6+ Mos 04/16/2013, 05/06/2014  . Influenza-Unspecified 02/03/2015, 02/08/2016, 03/15/2017  . PPD Test 10/14/2014    Qualifies for Shingles Vaccine? Not in past records  Screening Tests Health Maintenance  Topic Date Due  . PNA vac Low Risk Adult (1 of 2 - PCV13) 12/23/2017 (Originally 01/07/1996)  . TETANUS/TDAP  08/06/2023 (Originally 01/06/1948)  . INFLUENZA VACCINE  12/06/2017  . DEXA SCAN  Completed    Cancer Screenings: Lung: Low Dose CT Chest recommended if Age 75-80 years, 30 pack-year currently smoking OR have quit w/in 15years. Patient does not qualify. Breast:  Up to date on Mammogram? Yes   Up to date of Bone Density/Dexa? Yes Colorectal: up to date  Additional Screenings:  Hepatitis C Screening: declined TDAP due: ordered Prevnar due: ordered    Plan:    I have personally reviewed and addressed the Medicare Annual Wellness questionnaire and have noted the following in the patient's chart:  A. Medical and social history B. Use of alcohol, tobacco or illicit drugs  C. Current medications and supplements D. Functional ability and status E.  Nutritional status F.  Physical activity G. Advance directives H. List of other  physicians I.  Hospitalizations, surgeries, and ER visits in previous 12 months J.  Vitals K. Screenings to include hearing, vision, cognitive, depression L. Referrals and appointments - none  In addition, I have reviewed and discussed with patient certain preventive protocols, quality metrics,  and best practice recommendations. A written personalized care plan for preventive services as well as general preventive health recommendations were provided to patient.  See attached scanned questionnaire for additional information.   Signed,   Tyron Russell, RN Nurse Health Advisor  Patient Concerns: none

## 2017-12-04 ENCOUNTER — Other Ambulatory Visit: Payer: Self-pay

## 2017-12-04 MED ORDER — LORAZEPAM 0.5 MG PO TABS: 0.5000 mg | ORAL_TABLET | Freq: Three times a day (TID) | ORAL | 0 refills | Status: DC

## 2017-12-04 NOTE — Telephone Encounter (Signed)
Faxed to [facility] 

## 2017-12-17 ENCOUNTER — Encounter: Payer: Self-pay | Admitting: Internal Medicine

## 2017-12-17 NOTE — Assessment & Plan Note (Signed)
Controlled; continue Norvasc 10 mg daily and Tenormin 50 mg twice daily 

## 2017-12-17 NOTE — Assessment & Plan Note (Signed)
Controlled for age; continue Zetia 10 mg daily and Lipitor 40 mg daily

## 2017-12-17 NOTE — Assessment & Plan Note (Signed)
Controlled without exacerbation; continue atenolol 50 mg twice daily and ASA 81 mg daily; patient is not on diuretic; will follow weights

## 2017-12-28 ENCOUNTER — Non-Acute Institutional Stay (SKILLED_NURSING_FACILITY): Payer: Medicare Other | Admitting: Internal Medicine

## 2017-12-28 ENCOUNTER — Encounter: Payer: Self-pay | Admitting: Internal Medicine

## 2017-12-28 DIAGNOSIS — F329 Major depressive disorder, single episode, unspecified: Secondary | ICD-10-CM

## 2017-12-28 DIAGNOSIS — F419 Anxiety disorder, unspecified: Secondary | ICD-10-CM

## 2017-12-28 DIAGNOSIS — F32A Depression, unspecified: Secondary | ICD-10-CM

## 2017-12-28 DIAGNOSIS — J42 Unspecified chronic bronchitis: Secondary | ICD-10-CM | POA: Diagnosis not present

## 2017-12-28 NOTE — Progress Notes (Signed)
Location:  [facility] Room Number: 411P Place of Service:  SNF [phone])  Randon Goldsmithnne D. Lyn HollingsheadAlexander, MD  Patient Care Team: Margit HanksAlexander, Dimitrious Micciche D, MD as PCP - General (Internal Medicine)  Extended Emergency Contact Information Primary Emergency Contact: [name],[name] Address: [address]          [place] [id number] [place] Home Phone: [phone] Relation: Daughter Secondary Emergency Contact: [name],[name]  [place] Home Phone: [phone] Relation: Daughter    Allergies: Sulfa antibiotics  Chief Complaint  Patient presents with  . Medical Management of Chronic Issues    Routine Visit    HPI: Patient is 84 y.o. female who being seen for routine issues of COPD, depression, anxiety.  Past Medical History:  Diagnosis Date  . Acute on chronic diastolic heart failure (HCC)   . Aneurysm (HCC)    of the eye  . Anxiety 11/30/2010  . Closed fracture of part of upper end of humerus 10/14/2014  . COPD exacerbation (HCC)   . CVA (cerebral infarction) 11/30/2010  . Decubitus ulcer of sacral region, stage 4 (HCC)   . 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 Depression 12/23/2015  . History of stroke 11/25/2013  . Hyperlipidemia   . Hypertension   . Hypertensive heart disease with CHF (congestive heart failure) (HCC) 11/30/2010  . Iron deficiency anemia 12/10/2014  . Leukocytosis 10/15/2014  . Osteoporosis   . Postmenopausal HRT (hormone replacement therapy) 11/30/2010  . Stroke (HCC) 10/2008  . 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 EYE SURGERY    . PARTIAL HYSTERECTOMY  1968    Allergies as of 12/28/2017      Reactions   Sulfa Antibiotics    Cant remember reaction      Medication List        Accurate as of 12/28/17 11:59 PM. Always use your most recent med list.          amLODipine 10 MG  tablet Commonly known as:  NORVASC TAKE 1 TABLET AT BEDTIME   aspirin 81 MG chewable tablet Chew 81 mg by mouth daily.   atenolol 50 MG tablet Commonly known as:  TENORMIN TAKE 1 TABLET TWICE A DAY   atorvastatin 40 MG tablet Commonly known as:  LIPITOR TAKE 1 TABLET DAILY   cholecalciferol 1000 units tablet Commonly known as:  VITAMIN D Take 1,000 Units by mouth daily.   escitalopram 10 MG tablet Commonly known as:  LEXAPRO Take 15 mg by mouth daily.   ezetimibe 10 MG tablet Commonly known as:  ZETIA Take 10 mg by mouth daily.   ferrous sulfate 325 (65 FE) MG tablet Take 325 mg by mouth daily with breakfast.   fexofenadine 180 MG tablet Commonly known as:  ALLEGRA Take 180 mg by mouth daily.   ipratropium-albuterol 0.5-2.5 (3) MG/3ML Soln Commonly known as:  DUONEB Take 3 mLs by nebulization every 6 (six) hours as needed. wheezing/SOB   LORazepam 0.5 MG tablet Commonly known as:  ATIVAN Take 1 tablet (0.5 mg total) by mouth every 8 (eight) hours.   memantine 10 MG tablet Commonly known as:  NAMENDA Take 10 mg by mouth 2 (two) times daily.   mirtazapine 7.5 MG tablet Commonly known as:  REMERON Take 7.5 mg by mouth at bedtime.   oxyCODONE 5 MG immediate release tablet Commonly known as:  Oxy  IR/ROXICODONE Take 1 tablet (5 mg total) by mouth every 6 (six) hours as needed for severe pain.   polyethylene glycol packet Commonly known as:  MIRALAX / GLYCOLAX Take 17 g by mouth daily. To be hold for diarrhea   potassium chloride SA 20 MEQ tablet Commonly known as:  K-DUR,KLOR-CON Take 20 mEq by mouth daily.   sennosides-docusate sodium 8.6-50 MG tablet Commonly known as:  SENOKOT-S Take 2 tablets by mouth at bedtime.   SYSTANE 0.4-0.3 % Soln Generic drug:  Polyethyl Glycol-Propyl Glycol Apply 2 drops to eye 2 (two) times daily.   TAB-A-VITE Tabs Take 1 tablet by mouth daily.   traMADol 50 MG tablet Commonly known as:  ULTRAM Give 1 tablet by mouth  every 8 hours scheduled for pain   Vitamin D (Ergocalciferol) 50000 units Caps capsule Commonly known as:  DRISDOL Take 50,000 Units by mouth every 7 (seven) days.       No orders of the defined types were placed in this encounter.   Immunization History  Administered Date(s) Administered  . Influenza,inj,Quad PF,6+ Mos 04/16/2013, 05/06/2014  . Influenza-Unspecified 02/03/2015, 02/08/2016, 03/15/2017  . PPD Test 10/14/2014    Social History   Tobacco Use  . Smoking status: Former Smoker    Packs/day: 0.50    Years: 50.00    Pack years: 25.00  . Smokeless tobacco: Never Used  Substance Use Topics  . Alcohol use: No    Alcohol/week: 0.0 standard drinks    Review of Systems  DATA OBTAINED: from patient, nurse GENERAL:  no fevers, fatigue, appetite changes SKIN: No itching, rash HEENT: No complaint RESPIRATORY: No cough, wheezing, SOB CARDIAC: No chest pain, palpitations, lower extremity edema  GI: No abdominal pain, No N/V/D or constipation, No heartburn or reflux  GU: No dysuria, frequency or urgency, or incontinence  MUSCULOSKELETAL: No unrelieved bone/joint pain NEUROLOGIC: No headache, dizziness  PSYCHIATRIC: No overt anxiety or sadness  Vitals:   12/28/17 1213  BP: 139/72  Pulse: 64  Resp: 18  Temp: (!) 96.8 F (36 C)   Body mass index is 25.4 kg/m. Physical Exam  GENERAL APPEARANCE: Alert, conversant, No acute distress  SKIN: No diaphoresis rash HEENT: Unremarkable RESPIRATORY: Breathing is even, unlabored. Lung sounds are clear   CARDIOVASCULAR: Heart RRR no murmurs, rubs or gallops. No peripheral edema  GASTROINTESTINAL: Abdomen is soft, non-tender, not distended w/ normal bowel sounds.  GENITOURINARY: Bladder non tender, not distended  MUSCULOSKELETAL: No abnormal joints or musculature NEUROLOGIC: Cranial nerves 2-12 grossly intact. Moves all extremities PSYCHIATRIC: Mood and affect appropriate to situation, no behavioral issues  Patient  Active Problem List   Diagnosis Date Noted  . Osteoporosis 03/18/2016  . Depression 12/23/2015  . Chronic diastolic heart failure (HCC) 09/05/2015  . Right leg pain 08/05/2015  . COPD (chronic obstructive pulmonary disease) (HCC) 06/25/2015  . Loss of weight 01/21/2015  . Anemia, iron deficiency 01/05/2015  . Acute upper respiratory infection 01/05/2015  . Iron deficiency anemia 12/10/2014  . Stomach discomfort 11/30/2014  . Subacute confusional state 11/30/2014  . Acute respiratory failure with hypoxia (HCC) 11/11/2014  . AKI (acute kidney injury) (HCC) 11/11/2014  . Hypernatremia 11/11/2014  . Hypokalemia 11/09/2014  . COPD exacerbation (HCC)   . Acute on chronic diastolic heart failure (HCC)   . Hypoxia   . SOB (shortness of breath)   . Pressure ulcer stage II 11/02/2014  . Palliative care encounter 11/02/2014  . HCAP (healthcare-associated pneumonia) 11/01/2014  . Leukocytosis 10/15/2014  . Closed  fracture of part of upper end of humerus 10/14/2014  . Poor balance 11/25/2013  . History of stroke 11/25/2013  . 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 Myalgia 09/05/2012  . Disequilibrium 09/05/2012  . Hypertension, essential 09/05/2012  . 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 Mobility impaired 07/15/2012  . Physical exam, routine 12/07/2011  . Cerumen impaction 04/04/2011  . Trapezius muscle spasm 04/04/2011  . Hypertensive heart disease with CHF (congestive heart failure) (HCC) 11/30/2010  . Hyperlipidemia 11/30/2010  . CVA (cerebral infarction) 11/30/2010  . Postmenopausal HRT (hormone replacement therapy) 11/30/2010  . Anxiety 11/30/2010  . Ankle weakness 11/30/2010    CMP     Component Value Date/Time   NA 139 10/06/2017   K 4.7 10/06/2017   CL 102 10/13/2015 1505   CO2 22 10/13/2015 1505   GLUCOSE 118 (H) 10/13/2015 1505   BUN 20 10/06/2017   CREATININE 0.6 10/06/2017   CREATININE 0.79 10/13/2015 1505   CALCIUM 9.1 10/13/2015 1505   PROT 6.3  10/13/2015 1505   ALBUMIN 3.8 10/13/2015 1505   ALBUMIN 3.8 10/13/2015 1505   AST 23 10/06/2017   ALT 22 10/06/2017   ALKPHOS 125 10/06/2017   BILITOT 0.6 10/13/2015 1505   GFRNONAA >60 11/05/2014 0225   GFRAA >60 11/05/2014 0225   Recent Labs    04/10/17 10/06/17  NA 138 139  K 4.6 4.7  BUN 23* 20  CREATININE 0.5 0.6   Recent Labs    04/10/17 10/06/17  AST 16 23  ALT 15 22  ALKPHOS 106 125   Recent Labs    04/10/17 10/06/17  WBC 6.4 6.8  HGB 14.4 14.6  HCT 44 43  PLT 187 200   Recent Labs    04/10/17  CHOL 165  LDLCALC 95  TRIG 138   No results found for: MICROALBUR Lab Results  Component Value Date   TSH 2.60 04/10/2017   Lab Results  Component Value Date   HGBA1C 5.6 04/10/2017   Lab Results  Component Value Date   CHOL 165 04/10/2017   HDL 42 04/10/2017   LDLCALC 95 04/10/2017   LDLDIRECT 190.1 05/06/2014   TRIG 138 04/10/2017   CHOLHDL 7 05/06/2014    Significant Diagnostic Results in last 30 days:  No results found.  Assessment and Plan  COPD (chronic obstructive pulmonary disease) (HCC) No reported exacerbation; continue PRN albuterol and Allegra 180 mg daily  Depression Appears controlled; continue Lexapro 15 mg daily  Anxiety Appears controlled; continue Ativan 0.5 mg 3 times daily    Shanikqua Zarzycki D. [name], MD

## 2017-12-31 ENCOUNTER — Encounter: Payer: Self-pay | Admitting: Internal Medicine

## 2017-12-31 NOTE — Assessment & Plan Note (Signed)
Appears controlled; continue Ativan 0.5 mg 3 times daily

## 2017-12-31 NOTE — Assessment & Plan Note (Signed)
No reported exacerbation; continue PRN albuterol and Allegra 180 mg daily

## 2017-12-31 NOTE — Assessment & Plan Note (Signed)
Appears controlled; continue Lexapro 15 mg daily

## 2018-01-24 ENCOUNTER — Non-Acute Institutional Stay (SKILLED_NURSING_FACILITY): Payer: Medicare Other | Admitting: Internal Medicine

## 2018-01-24 ENCOUNTER — Encounter: Payer: Self-pay | Admitting: Internal Medicine

## 2018-01-24 DIAGNOSIS — D508 Other iron deficiency anemias: Secondary | ICD-10-CM

## 2018-01-24 DIAGNOSIS — I110 Hypertensive heart disease with heart failure: Secondary | ICD-10-CM | POA: Diagnosis not present

## 2018-01-24 DIAGNOSIS — F0280 Dementia in other diseases classified elsewhere without behavioral disturbance: Secondary | ICD-10-CM | POA: Diagnosis not present

## 2018-01-24 DIAGNOSIS — G20 Parkinson's disease: Secondary | ICD-10-CM

## 2018-01-24 DIAGNOSIS — I5032 Chronic diastolic (congestive) heart failure: Secondary | ICD-10-CM

## 2018-01-24 NOTE — Progress Notes (Signed)
Location:  [facility] Room Number: 411P Place of Service:  SNF [phone])  Beverly Black. Beverly Hollingshead, MD  Patient Care Team: [name], MD as PCP - General (Internal Medicine)  Extended Emergency Contact Information Primary Emergency Contact: [name],[name] Address: [address]          [name] [address] [name] Home Phone: [phone] Relation: Daughter Secondary Emergency Contact: [name] Home Phone: [phone] Relation: Daughter    Allergies: Sulfa antibiotics  Chief Complaint  Patient presents with  . Medical Management of Chronic Issues    Routine Visit    HPI: Patient is 84 y.o. female who is being seen for routine issues of dementia, iron deficiency anemia, and hypertension.  Past Medical History:  Diagnosis Date  . Acute on chronic diastolic heart failure (HCC)   . Aneurysm (HCC)    of the eye  . Anxiety 11/30/2010  . Closed fracture of part of upper end of humerus 10/14/2014  . COPD exacerbation (HCC)   . CVA (cerebral infarction) 11/30/2010  . Decubitus ulcer of sacral region, stage 4 (HCC)   . 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 Depression 12/23/2015  . History of stroke 11/25/2013  . Hyperlipidemia   . Hypertension   . Hypertensive heart disease with CHF (congestive heart failure) (HCC) 11/30/2010  . Iron deficiency anemia 12/10/2014  . Leukocytosis 10/15/2014  . Osteoporosis   . Postmenopausal HRT (hormone replacement therapy) 11/30/2010  . Stroke (HCC) 10/2008  . 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 EYE SURGERY    . PARTIAL HYSTERECTOMY  1968    Allergies as of 01/24/2018      Reactions   Sulfa Antibiotics    Cant remember reaction      Medication List        Accurate as of 01/24/18 11:59 PM. Always use your most recent med list.          amLODipine 10 MG tablet Commonly known as:  NORVASC TAKE 1 TABLET AT BEDTIME   aspirin 81 MG chewable tablet Chew 81 mg by mouth daily.   atenolol 50 MG tablet Commonly known as:  TENORMIN TAKE 1 TABLET TWICE A DAY   atorvastatin 40 MG tablet Commonly known as:  LIPITOR TAKE 1 TABLET DAILY   cholecalciferol 1000 units tablet Commonly known as:  VITAMIN D Take 1,000 Units by mouth daily.   escitalopram 10 MG tablet Commonly known as:  LEXAPRO Take 15 mg by mouth daily.   ezetimibe 10 MG tablet Commonly known as:  ZETIA Take 10 mg by mouth daily.   ferrous sulfate 325 (65 FE) MG tablet Take 325 mg by mouth daily with breakfast.   fexofenadine 180 MG tablet Commonly known as:  ALLEGRA Take 180 mg by mouth daily.   ipratropium-albuterol 0.5-2.5 (3) MG/3ML Soln Commonly known as:  DUONEB Take 3 mLs by nebulization every 6 (six) hours as needed. wheezing/SOB   LORazepam 0.5 MG tablet Commonly known as:  ATIVAN Take 1 tablet (0.5 mg total) by mouth every 8 (eight) hours.   memantine 10 MG tablet Commonly known as:  NAMENDA Take 10 mg by mouth 2 (two) times daily.   mirtazapine 7.5 MG tablet Commonly known as:  REMERON Take 7.5 mg by mouth at bedtime.   oxyCODONE 5 MG immediate release tablet Commonly known  as:  Oxy IR/ROXICODONE Take 1 tablet (5 mg total) by mouth every 6 (six) hours as needed for severe pain.   polyethylene glycol packet Commonly known as:  MIRALAX / GLYCOLAX Take 17 g by mouth daily. To be hold for diarrhea   potassium chloride SA 20 MEQ tablet Commonly known as:  K-DUR,KLOR-CON Take 20 mEq by mouth daily.   sennosides-docusate sodium 8.6-50 MG tablet Commonly known as:  SENOKOT-S Take 2 tablets by mouth at bedtime.   SYSTANE 0.4-0.3 % Soln Generic drug:  Polyethyl Glycol-Propyl Glycol Apply 2 drops to eye 2 (two) times daily.   TAB-A-VITE Tabs Take 1 tablet by mouth daily.   traMADol 50 MG tablet Commonly known as:   ULTRAM Give 1 tablet by mouth every 8 hours scheduled for pain   Vitamin D (Ergocalciferol) 50000 units Caps capsule Commonly known as:  DRISDOL Take 50,000 Units by mouth every 7 (seven) days.       No orders of the defined types were placed in this encounter.   Immunization History  Administered Date(s) Administered  . Influenza,inj,Quad PF,6+ Mos 04/16/2013, 05/06/2014  . Influenza-Unspecified 02/03/2015, 02/08/2016, 03/15/2017  . PPD Test 10/14/2014    Social History   Tobacco Use  . Smoking status: Former Smoker    Packs/day: 0.50    Years: 50.00    Pack years: 25.00  . Smokeless tobacco: Never Used  Substance Use Topics  . Alcohol use: No    Alcohol/week: 0.0 standard drinks    Review of Systems  DATA OBTAINED: from patient, nurse GENERAL:  no fevers, fatigue, appetite changes SKIN: No itching, rash HEENT: No complaint RESPIRATORY: No cough, wheezing, SOB CARDIAC: No chest pain, palpitations, lower extremity edema  GI: No abdominal pain, No N/V/D or constipation, No heartburn or reflux  GU: No dysuria, frequency or urgency, or incontinence  MUSCULOSKELETAL: No unrelieved bone/joint pain NEUROLOGIC: No headache, dizziness  PSYCHIATRIC: No overt anxiety or sadness  Vitals:   01/24/18 1432  BP: (!) 100/53  Pulse: 84  Resp: 17  Temp: 97.6 F (36.4 C)   Body mass index is 25.4 kg/m. Physical Exam  GENERAL APPEARANCE: Alert, conversant, No acute distress  SKIN: No diaphoresis rash HEENT: Unremarkable RESPIRATORY: Breathing is even, unlabored. Lung sounds are clear   CARDIOVASCULAR: Heart RRR no murmurs, rubs or gallops. No peripheral edema  GASTROINTESTINAL: Abdomen is soft, non-tender, not distended w/ normal bowel sounds.  GENITOURINARY: Bladder non tender, not distended  MUSCULOSKELETAL: No abnormal joints or musculature NEUROLOGIC: Cranial nerves 2-12 grossly intact. Moves all extremities PSYCHIATRIC: Mood and affect with dementia, no behavioral  issues  Patient Active Problem List   Diagnosis Date Noted  . Osteoporosis 03/18/2016  . Depression 12/23/2015  . Chronic diastolic heart failure (HCC) 09/05/2015  . Right leg pain 08/05/2015  . COPD (chronic obstructive pulmonary disease) (HCC) 06/25/2015  . Loss of weight 01/21/2015  . Anemia, iron deficiency 01/05/2015  . Acute upper respiratory infection 01/05/2015  . Iron deficiency anemia 12/10/2014  . Stomach discomfort 11/30/2014  . Subacute confusional state 11/30/2014  . Acute respiratory failure with hypoxia (HCC) 11/11/2014  . AKI (acute kidney injury) (HCC) 11/11/2014  . Hypernatremia 11/11/2014  . Hypokalemia 11/09/2014  . COPD exacerbation (HCC)   . Acute on chronic diastolic heart failure (HCC)   . Hypoxia   . SOB (shortness of breath)   . Pressure ulcer stage II 11/02/2014  . Palliative care encounter 11/02/2014  . HCAP (healthcare-associated pneumonia) 11/01/2014  . Leukocytosis 10/15/2014  .  Closed fracture of part of upper end of humerus 10/14/2014  . Poor balance 11/25/2013  . History of stroke 11/25/2013  . 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 Myalgia 09/05/2012  . Disequilibrium 09/05/2012  . Hypertension, essential 09/05/2012  . 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 Mobility impaired 07/15/2012  . Physical exam, routine 12/07/2011  . Cerumen impaction 04/04/2011  . Trapezius muscle spasm 04/04/2011  . Hypertensive heart disease with CHF (congestive heart failure) (HCC) 11/30/2010  . Hyperlipidemia 11/30/2010  . CVA (cerebral infarction) 11/30/2010  . Postmenopausal HRT (hormone replacement therapy) 11/30/2010  . Anxiety 11/30/2010  . Ankle weakness 11/30/2010    CMP     Component Value Date/Time   NA 139 10/06/2017   K 4.7 10/06/2017   CL 102 10/13/2015 1505   CO2 22 10/13/2015 1505   GLUCOSE 118 (H) 10/13/2015 1505   BUN 20 10/06/2017   CREATININE 0.6 10/06/2017   CREATININE 0.79 10/13/2015 1505   CALCIUM 9.1 10/13/2015  1505   PROT 6.3 10/13/2015 1505   ALBUMIN 3.8 10/13/2015 1505   ALBUMIN 3.8 10/13/2015 1505   AST 23 10/06/2017   ALT 22 10/06/2017   ALKPHOS 125 10/06/2017   BILITOT 0.6 10/13/2015 1505   GFRNONAA >60 11/05/2014 0225   GFRAA >60 11/05/2014 0225   Recent Labs    04/10/17 10/06/17  NA 138 139  K 4.6 4.7  BUN 23* 20  CREATININE 0.5 0.6   Recent Labs    04/10/17 10/06/17  AST 16 23  ALT 15 22  ALKPHOS 106 125   Recent Labs    04/10/17 10/06/17  WBC 6.4 6.8  HGB 14.4 14.6  HCT 44 43  PLT 187 200   Recent Labs    04/10/17  CHOL 165  LDLCALC 95  TRIG 138   No results found for: MICROALBUR Lab Results  Component Value Date   TSH 2.60 04/10/2017   Lab Results  Component Value Date   HGBA1C 5.6 04/10/2017   Lab Results  Component Value Date   CHOL 165 04/10/2017   HDL 42 04/10/2017   LDLCALC 95 04/10/2017   LDLDIRECT 190.1 05/06/2014   TRIG 138 04/10/2017   CHOLHDL 7 05/06/2014    Significant Diagnostic Results in last 30 days:  No results found.  Assessment and Plan  Dementia due to Parkinson's disease without behavioral disturbance (HCC) Appears mild to moderate without decline; continue Namenda 10 mg twice daily  Iron deficiency anemia Hemoglobin is 14.6 which is stable from prior and very good; continue iron 325 mg daily  Hypertensive heart disease with CHF (congestive heart failure) (HCC) Controlled; continue Tenormin 50 mg twice daily and Norvasc 10 mg daily    Roemello Speyer D. Beverly HollingsheadAlexander, MD

## 2018-02-01 ENCOUNTER — Encounter: Payer: Self-pay | Admitting: Internal Medicine

## 2018-02-01 NOTE — Assessment & Plan Note (Signed)
Hemoglobin is 14.6 which is stable from prior and very good; continue iron 325 mg daily

## 2018-02-01 NOTE — Assessment & Plan Note (Signed)
Appears mild to moderate without decline; continue Namenda 10 mg twice daily

## 2018-02-01 NOTE — Assessment & Plan Note (Signed)
Controlled; continue Tenormin 50 mg twice daily and Norvasc 10 mg daily

## 2018-02-25 ENCOUNTER — Encounter: Payer: Self-pay | Admitting: Internal Medicine

## 2018-02-25 ENCOUNTER — Non-Acute Institutional Stay (SKILLED_NURSING_FACILITY): Payer: Medicare Other | Admitting: Internal Medicine

## 2018-02-25 DIAGNOSIS — M818 Other osteoporosis without current pathological fracture: Secondary | ICD-10-CM

## 2018-02-25 DIAGNOSIS — F329 Major depressive disorder, single episode, unspecified: Secondary | ICD-10-CM

## 2018-02-25 DIAGNOSIS — I5032 Chronic diastolic (congestive) heart failure: Secondary | ICD-10-CM | POA: Diagnosis not present

## 2018-02-25 DIAGNOSIS — F32A Depression, unspecified: Secondary | ICD-10-CM

## 2018-02-25 NOTE — Progress Notes (Signed)
Location:  [facility] Room Number: 411P Place of Service:  SNF ([phone])  [name]. [name], MD  Patient Care Team: [name], MD as PCP - General (Internal Medicine)  Extended Emergency Contact Information Primary Emergency Contact: [name],[name] Address: [address]          [name] [address] [name] Home Phone: [phone] Relation: Daughter Secondary Emergency Contact: [name] [place] Home Phone: [phone] Relation: Daughter    Allergies: Sulfa antibiotics  Chief Complaint  Patient presents with  . Medical Management of Chronic Issues    Routine Visit    HPI: Patient is 84 y.o. female who is being seen for routine issues of osteoporosis, dementia, and chronic diastolic congestive heart failure.  Past Medical History:  Diagnosis Date  . Acute on chronic diastolic heart failure (HCC)   . Aneurysm (HCC)    of the eye  . Anxiety 11/30/2010  . Closed fracture of part of upper end of humerus 10/14/2014  . COPD exacerbation (HCC)   . CVA (cerebral infarction) 11/30/2010  . Decubitus ulcer of sacral region, stage 4 (HCC)   . 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 Depression 12/23/2015  . History of stroke 11/25/2013  . Hyperlipidemia   . Hypertension   . Hypertensive heart disease with CHF (congestive heart failure) (HCC) 11/30/2010  . Iron deficiency anemia 12/10/2014  . Leukocytosis 10/15/2014  . Osteoporosis   . Postmenopausal HRT (hormone replacement therapy) 11/30/2010  . Stroke (HCC) 10/2008  . 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 EYE SURGERY    . PARTIAL HYSTERECTOMY  1968    Allergies as of 02/25/2018      Reactions   Sulfa Antibiotics    Cant remember reaction      Medication List        Accurate as of 02/25/18 11:59 PM. Always use your most  recent med list.          amLODipine 10 MG tablet Commonly known as:  NORVASC TAKE 1 TABLET AT BEDTIME   aspirin 81 MG chewable tablet Chew 81 mg by mouth daily.   atenolol 50 MG tablet Commonly known as:  TENORMIN TAKE 1 TABLET TWICE A DAY   atorvastatin 40 MG tablet Commonly known as:  LIPITOR TAKE 1 TABLET DAILY   escitalopram 10 MG tablet Commonly known as:  LEXAPRO Take 15 mg by mouth daily.   ezetimibe 10 MG tablet Commonly known as:  ZETIA Take 10 mg by mouth daily.   ferrous sulfate 325 (65 FE) MG tablet Take 325 mg by mouth daily with breakfast.   fexofenadine 180 MG tablet Commonly known as:  ALLEGRA Take 180 mg by mouth daily.   ipratropium-albuterol 0.5-2.5 (3) MG/3ML Soln Commonly known as:  DUONEB Take 3 mLs by nebulization every 6 (six) hours as needed. wheezing/SOB   LORazepam 0.5 MG tablet Commonly known as:  ATIVAN Take 1 tablet (0.5 mg total) by mouth every 8 (eight) hours.   memantine 10 MG tablet Commonly known as:  NAMENDA Take 10 mg by mouth 2 (two) times daily.   mirtazapine 7.5 MG tablet Commonly known as:  REMERON Take 7.5 mg by mouth at bedtime.   oxyCODONE 5 MG immediate release tablet Commonly known as:  Oxy IR/ROXICODONE Take 1 tablet (5 mg total) by mouth every 6 (six)  hours as needed for severe pain.   polyethylene glycol packet Commonly known as:  MIRALAX / GLYCOLAX Take 17 g by mouth daily. To be hold for diarrhea   potassium chloride SA 20 MEQ tablet Commonly known as:  K-DUR,KLOR-CON Take 20 mEq by mouth daily.   sennosides-docusate sodium 8.6-50 MG tablet Commonly known as:  SENOKOT-S Take 2 tablets by mouth at bedtime.   SYSTANE 0.4-0.3 % Soln Generic drug:  Polyethyl Glycol-Propyl Glycol Apply 2 drops to eye 2 (two) times daily.   TAB-A-VITE Tabs Take 1 tablet by mouth daily.   traMADol 50 MG tablet Commonly known as:  ULTRAM Give 1 tablet by mouth every 8 hours scheduled for pain   Vitamin D  (Ergocalciferol) 50000 units Caps capsule Commonly known as:  DRISDOL Take 50,000 Units by mouth every 7 (seven) days.       No orders of the defined types were placed in this encounter.   Immunization History  Administered Date(s) Administered  . Influenza,inj,Quad PF,6+ Mos 04/16/2013, 05/06/2014  . Influenza-Unspecified 02/03/2015, 02/08/2016, 03/15/2017  . PPD Test 10/14/2014    Social History   Tobacco Use  . Smoking status: Former Smoker    Packs/day: 0.50    Years: 50.00    Pack years: 25.00  . Smokeless tobacco: Never Used  Substance Use Topics  . Alcohol use: No    Alcohol/week: 0.0 standard drinks    Review of Systems  DATA OBTAINED: from patient, nurse GENERAL:  no fevers, fatigue, appetite changes SKIN: No itching, rash HEENT: No complaint RESPIRATORY: No cough, wheezing, SOB CARDIAC: No chest pain, palpitations, lower extremity edema  GI: No abdominal pain, No N/V/D or constipation, No heartburn or reflux  GU: No dysuria, frequency or urgency, or incontinence  MUSCULOSKELETAL: No unrelieved bone/joint pain NEUROLOGIC: No headache, dizziness  PSYCHIATRIC: No overt anxiety or sadness  Vitals:   02/25/18 1303  BP: 124/71  Pulse: 69  Resp: 17  Temp: 98.1 F (36.7 C)   Body mass index is 25.44 kg/m. Physical Exam  GENERAL APPEARANCE: Alert, conversant, No acute distress  SKIN: No diaphoresis rash HEENT: Unremarkable RESPIRATORY: Breathing is even, unlabored.   CARDIOVASCULAR:  No peripheral edema  GASTROINTESTINAL: Abdomen  not distended  GENITOURINARY: Bladder  not distended  MUSCULOSKELETAL: No abnormal joints or musculature NEUROLOGIC: Cranial nerves 2-12 grossly intact. Moves all extremities PSYCHIATRIC: Mood and affect appropriate to situation, no behavioral issues  Patient Active Problem List   Diagnosis Date Noted  . Osteoporosis 03/18/2016  . Depression 12/23/2015  . Chronic diastolic heart failure (HCC) 09/05/2015  . Right leg  pain 08/05/2015  . COPD (chronic obstructive pulmonary disease) (HCC) 06/25/2015  . Loss of weight 01/21/2015  . Anemia, iron deficiency 01/05/2015  . Acute upper respiratory infection 01/05/2015  . Iron deficiency anemia 12/10/2014  . Stomach discomfort 11/30/2014  . Subacute confusional state 11/30/2014  . Acute respiratory failure with hypoxia (HCC) 11/11/2014  . AKI (acute kidney injury) (HCC) 11/11/2014  . Hypernatremia 11/11/2014  . Hypokalemia 11/09/2014  . COPD exacerbation (HCC)   . Acute on chronic diastolic heart failure (HCC)   . Hypoxia   . SOB (shortness of breath)   . Pressure ulcer stage II 11/02/2014  . Palliative care encounter 11/02/2014  . HCAP (healthcare-associated pneumonia) 11/01/2014  . Leukocytosis 10/15/2014  . Closed fracture of part of upper end of humerus 10/14/2014  . Poor balance 11/25/2013  . History of stroke 11/25/2013  . 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 Myalgia 09/05/2012  .  Disequilibrium 09/05/2012  . Hypertension, essential 09/05/2012  . 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 Mobility impaired 07/15/2012  . Physical exam, routine 12/07/2011  . Cerumen impaction 04/04/2011  . Trapezius muscle spasm 04/04/2011  . Hypertensive heart disease with CHF (congestive heart failure) (HCC) 11/30/2010  . Hyperlipidemia 11/30/2010  . CVA (cerebral infarction) 11/30/2010  . Postmenopausal HRT (hormone replacement therapy) 11/30/2010  . Anxiety 11/30/2010  . Ankle weakness 11/30/2010    CMP     Component Value Date/Time   NA 139 10/06/2017   K 4.7 10/06/2017   CL 102 10/13/2015 1505   CO2 22 10/13/2015 1505   GLUCOSE 118 (H) 10/13/2015 1505   BUN 20 10/06/2017   CREATININE 0.6 10/06/2017   CREATININE 0.79 10/13/2015 1505   CALCIUM 9.1 10/13/2015 1505   PROT 6.3 10/13/2015 1505   ALBUMIN 3.8 10/13/2015 1505   ALBUMIN 3.8 10/13/2015 1505   AST 23 10/06/2017   ALT 22 10/06/2017   ALKPHOS 125 10/06/2017   BILITOT 0.6  10/13/2015 1505   GFRNONAA >60 11/05/2014 0225   GFRAA >60 11/05/2014 0225   Recent Labs    04/10/17 10/06/17  NA 138 139  K 4.6 4.7  BUN 23* 20  CREATININE 0.5 0.6   Recent Labs    04/10/17 10/06/17  AST 16 23  ALT 15 22  ALKPHOS 106 125   Recent Labs    04/10/17 10/06/17  WBC 6.4 6.8  HGB 14.4 14.6  HCT 44 43  PLT 187 200   Recent Labs    04/10/17  CHOL 165  LDLCALC 95  TRIG 138   No results found for: MICROALBUR Lab Results  Component Value Date   TSH 2.60 04/10/2017   Lab Results  Component Value Date   HGBA1C 5.6 04/10/2017   Lab Results  Component Value Date   CHOL 165 04/10/2017   HDL 42 04/10/2017   LDLCALC 95 04/10/2017   LDLDIRECT 190.1 05/06/2014   TRIG 138 04/10/2017   CHOLHDL 7 05/06/2014    Significant Diagnostic Results in last 30 days:  No results found.  Assessment and Plan  Osteoporosis No fractures; continue vitamin D 50,000 units weekly, vitamin D level was low on the thousand units daily  Depression Appears stable; continue Lexapro 15 mg daily  Chronic diastolic heart failure (HCC) No exacerbation; continue atenolol 50 mg twice daily and ASA 81 mg daily; patient is not on diuretic, weights are followed    [name]. [name], MD

## 2018-03-10 ENCOUNTER — Encounter: Payer: Self-pay | Admitting: Internal Medicine

## 2018-03-10 NOTE — Assessment & Plan Note (Signed)
No exacerbation; continue atenolol 50 mg twice daily and ASA 81 mg daily; patient is not on diuretic, weights are followed

## 2018-03-10 NOTE — Assessment & Plan Note (Signed)
Appears stable; continue Lexapro 15 mg daily

## 2018-03-10 NOTE — Assessment & Plan Note (Signed)
No fractures; continue vitamin D 50,000 units weekly, vitamin D level was low on the thousand units daily

## 2018-03-27 ENCOUNTER — Encounter: Payer: Self-pay | Admitting: Internal Medicine

## 2018-03-27 ENCOUNTER — Non-Acute Institutional Stay (SKILLED_NURSING_FACILITY): Payer: Medicare Other | Admitting: Internal Medicine

## 2018-03-27 DIAGNOSIS — Z8673 Personal history of transient ischemic attack (TIA), and cerebral infarction without residual deficits: Secondary | ICD-10-CM

## 2018-03-27 DIAGNOSIS — J3089 Other allergic rhinitis: Secondary | ICD-10-CM

## 2018-03-27 DIAGNOSIS — F419 Anxiety disorder, unspecified: Secondary | ICD-10-CM | POA: Diagnosis not present

## 2018-03-27 NOTE — Progress Notes (Signed)
Location:  [facility] Room Number: 411P Place of Service:  SNF ([phone])  Beverly Black. Beverly Hollingshead, MD  Patient Care Team: [name], MD as PCP - General (Internal Medicine)  Extended Emergency Contact Information Primary Emergency Contact: [name],[name] Address: [address]          [name] [address] [name] Home Phone: [phone] Relation: Daughter Secondary Emergency Contact: [name] [place] Home Phone: [phone] Relation: Daughter    Allergies: Sulfa antibiotics  Chief Complaint  Patient presents with  . Medical Management of Chronic Issues    Routine Visit    HPI: Patient is 84 y.o. female who is being seen for routine issues of anxiety, history of stroke, and allergic rhinitis.  Past Medical History:  Diagnosis Date  . Acute on chronic diastolic heart failure (HCC)   . Aneurysm (HCC)    of the eye  . Anxiety 11/30/2010  . Closed fracture of part of upper end of humerus 10/14/2014  . COPD exacerbation (HCC)   . CVA (cerebral infarction) 11/30/2010  . Decubitus ulcer of sacral region, stage 4 (HCC)   . 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 Depression 12/23/2015  . History of stroke 11/25/2013  . Hyperlipidemia   . Hypertension   . Hypertensive heart disease with CHF (congestive heart failure) (HCC) 11/30/2010  . Iron deficiency anemia 12/10/2014  . Leukocytosis 10/15/2014  . Osteoporosis   . Postmenopausal HRT (hormone replacement therapy) 11/30/2010  . Stroke (HCC) 10/2008  . 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 EYE SURGERY    . PARTIAL HYSTERECTOMY  1968    Allergies as of 03/27/2018      Reactions   Sulfa Antibiotics    Cant remember reaction      Medication List        Accurate as of 03/27/18 11:59 PM. Always use your most recent med list.            amLODipine 10 MG tablet Commonly known as:  NORVASC TAKE 1 TABLET AT BEDTIME   aspirin 81 MG chewable tablet Chew 81 mg by mouth daily.   atenolol 50 MG tablet Commonly known as:  TENORMIN TAKE 1 TABLET TWICE A DAY   atorvastatin 40 MG tablet Commonly known as:  LIPITOR TAKE 1 TABLET DAILY   escitalopram 10 MG tablet Commonly known as:  LEXAPRO Take 15 mg by mouth daily.   ezetimibe 10 MG tablet Commonly known as:  ZETIA Take 10 mg by mouth daily.   ferrous sulfate 325 (65 FE) MG tablet Take 325 mg by mouth daily with breakfast.   fexofenadine 180 MG tablet Commonly known as:  ALLEGRA Take 180 mg by mouth daily.   ipratropium-albuterol 0.5-2.5 (3) MG/3ML Soln Commonly known as:  DUONEB Take 3 mLs by nebulization every 6 (six) hours as needed. wheezing/SOB   LORazepam 0.5 MG tablet Commonly known as:  ATIVAN Take 1 tablet (0.5 mg total) by mouth every 8 (eight) hours.   memantine 10 MG tablet Commonly known as:  NAMENDA Take 10 mg by mouth 2 (two) times daily.   mirtazapine 7.5 MG tablet Commonly known as:  REMERON Take 7.5 mg by mouth at bedtime.   oxyCODONE 5 MG immediate release tablet Commonly known as:  Oxy IR/ROXICODONE Take 1 tablet (5 mg total) by mouth every 6 (six)  hours as needed for severe pain.   polyethylene glycol packet Commonly known as:  MIRALAX / GLYCOLAX Take 17 g by mouth daily. To be hold for diarrhea   potassium chloride SA 20 MEQ tablet Commonly known as:  K-DUR,KLOR-CON Take 20 mEq by mouth daily.   sennosides-docusate sodium 8.6-50 MG tablet Commonly known as:  SENOKOT-S Take 2 tablets by mouth at bedtime.   SYSTANE 0.4-0.3 % Soln Generic drug:  Polyethyl Glycol-Propyl Glycol Apply 2 drops to eye 2 (two) times daily.   TAB-A-VITE Tabs Take 1 tablet by mouth daily.   traMADol 50 MG tablet Commonly known as:  ULTRAM Give 1 tablet by mouth every 8 hours scheduled for pain       No orders of the defined types  were placed in this encounter.   Immunization History  Administered Date(s) Administered  . Influenza,inj,Quad PF,6+ Mos 04/16/2013, 05/06/2014  . Influenza-Unspecified 02/03/2015, 02/08/2016, 03/15/2017  . PPD Test 10/14/2014    Social History   Tobacco Use  . Smoking status: Former Smoker    Packs/day: 0.50    Years: 50.00    Pack years: 25.00  . Smokeless tobacco: Never Used  Substance Use Topics  . Alcohol use: No    Alcohol/week: 0.0 standard drinks    Review of Systems  DATA OBTAINED: from nurse, patient GENERAL:  no fevers, fatigue, appetite changes SKIN: No itching, rash HEENT: No complaint RESPIRATORY: No cough, wheezing, SOB CARDIAC: No chest pain, palpitations, lower extremity edema  GI: No abdominal pain, No N/V/D or constipation, No heartburn or reflux  GU: No dysuria, frequency or urgency, or incontinence  MUSCULOSKELETAL: No unrelieved bone/joint pain NEUROLOGIC: No headache, dizziness  PSYCHIATRIC: No overt anxiety or sadness  Vitals:   03/27/18 1105  BP: 112/64  Pulse: 64  Resp: 18  Temp: 97.6 F (36.4 C)   Body mass index is 25.4 kg/m. Physical Exam  GENERAL APPEARANCE: Alert, conversant, No acute distress  SKIN: No diaphoresis rash HEENT: Unremarkable RESPIRATORY: Breathing is even, unlabored  CARDIOVASCULAR:  No peripheral edema  GASTROINTESTINAL: Abdomen is not distended  GENITOURINARY: Bladder non tender, not distended  MUSCULOSKELETAL: No abnormal joints or musculature NEUROLOGIC: Cranial nerves 2-12 grossly intact. Moves all extremities PSYCHIATRIC: Mood and affect appropriate to situation, no behavioral issues  Patient Active Problem List   Diagnosis Date Noted  . Allergic rhinitis 03/31/2018  . Osteoporosis 03/18/2016  . Depression 12/23/2015  . Chronic diastolic heart failure (HCC) 09/05/2015  . Right leg pain 08/05/2015  . COPD (chronic obstructive pulmonary disease) (HCC) 06/25/2015  . Loss of weight 01/21/2015  .  Anemia, iron deficiency 01/05/2015  . Acute upper respiratory infection 01/05/2015  . Iron deficiency anemia 12/10/2014  . Stomach discomfort 11/30/2014  . Subacute confusional state 11/30/2014  . Acute respiratory failure with hypoxia (HCC) 11/11/2014  . AKI (acute kidney injury) (HCC) 11/11/2014  . Hypernatremia 11/11/2014  . Hypokalemia 11/09/2014  . COPD exacerbation (HCC)   . Acute on chronic diastolic heart failure (HCC)   . Hypoxia   . SOB (shortness of breath)   . Pressure ulcer stage II 11/02/2014  . Palliative care encounter 11/02/2014  . HCAP (healthcare-associated pneumonia) 11/01/2014  . Leukocytosis 10/15/2014  . Closed fracture of part of upper end of humerus 10/14/2014  . Poor balance 11/25/2013  . History of stroke 11/25/2013  . 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 Myalgia 09/05/2012  . Disequilibrium 09/05/2012  . Hypertension, essential 09/05/2012  . 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 Mobility impaired  07/15/2012  . Physical exam, routine 12/07/2011  . Cerumen impaction 04/04/2011  . Trapezius muscle spasm 04/04/2011  . Hypertensive heart disease with CHF (congestive heart failure) (HCC) 11/30/2010  . Hyperlipidemia 11/30/2010  . CVA (cerebral infarction) 11/30/2010  . Postmenopausal HRT (hormone replacement therapy) 11/30/2010  . Anxiety 11/30/2010  . Ankle weakness 11/30/2010    CMP     Component Value Date/Time   NA 139 10/06/2017   K 4.7 10/06/2017   CL 102 10/13/2015 1505   CO2 22 10/13/2015 1505   GLUCOSE 118 (H) 10/13/2015 1505   BUN 20 10/06/2017   CREATININE 0.6 10/06/2017   CREATININE 0.79 10/13/2015 1505   CALCIUM 9.1 10/13/2015 1505   PROT 6.3 10/13/2015 1505   ALBUMIN 3.8 10/13/2015 1505   ALBUMIN 3.8 10/13/2015 1505   AST 23 10/06/2017   ALT 22 10/06/2017   ALKPHOS 125 10/06/2017   BILITOT 0.6 10/13/2015 1505   GFRNONAA >60 11/05/2014 0225   GFRAA >60 11/05/2014 0225   Recent Labs    04/10/17 10/06/17  NA 138  139  K 4.6 4.7  BUN 23* 20  CREATININE 0.5 0.6   Recent Labs    04/10/17 10/06/17  AST 16 23  ALT 15 22  ALKPHOS 106 125   Recent Labs    04/10/17 10/06/17  WBC 6.4 6.8  HGB 14.4 14.6  HCT 44 43  PLT 187 200   Recent Labs    04/10/17  CHOL 165  LDLCALC 95  TRIG 138   No results found for: MICROALBUR Lab Results  Component Value Date   TSH 2.60 04/10/2017   Lab Results  Component Value Date   HGBA1C 5.6 04/10/2017   Lab Results  Component Value Date   CHOL 165 04/10/2017   HDL 42 04/10/2017   LDLCALC 95 04/10/2017   LDLDIRECT 190.1 05/06/2014   TRIG 138 04/10/2017   CHOLHDL 7 05/06/2014    Significant Diagnostic Results in last 30 days:  No results found.  Assessment and Plan  Anxiety Continues to appear controlled; continue Ativan 0.5 mg every 8  History of stroke No recurrences; continue aspirin 81 mg daily   Allergic rhinitis No reported problems; continue Allegra 180 mg p.o. daily    Beverly Wilms D. Beverly Hollingshead, MD

## 2018-03-31 ENCOUNTER — Encounter: Payer: Self-pay | Admitting: Internal Medicine

## 2018-03-31 DIAGNOSIS — J309 Allergic rhinitis, unspecified: Secondary | ICD-10-CM | POA: Insufficient documentation

## 2018-03-31 NOTE — Assessment & Plan Note (Signed)
No recurrences; continue aspirin 81 mg daily

## 2018-03-31 NOTE — Assessment & Plan Note (Signed)
No reported problems; continue Allegra 180 mg p.o. daily

## 2018-03-31 NOTE — Assessment & Plan Note (Signed)
Continues to appear controlled; continue Ativan 0.5 mg every 8

## 2018-04-23 ENCOUNTER — Encounter: Payer: Self-pay | Admitting: Internal Medicine

## 2018-04-23 ENCOUNTER — Non-Acute Institutional Stay (SKILLED_NURSING_FACILITY): Payer: Medicare Other | Admitting: Internal Medicine

## 2018-04-23 DIAGNOSIS — J42 Unspecified chronic bronchitis: Secondary | ICD-10-CM | POA: Diagnosis not present

## 2018-04-23 DIAGNOSIS — I5032 Chronic diastolic (congestive) heart failure: Secondary | ICD-10-CM

## 2018-04-23 DIAGNOSIS — I110 Hypertensive heart disease with heart failure: Secondary | ICD-10-CM | POA: Diagnosis not present

## 2018-04-23 DIAGNOSIS — J3089 Other allergic rhinitis: Secondary | ICD-10-CM

## 2018-04-23 NOTE — Progress Notes (Signed)
[facility] Room Number: 411P Place of Service:  SNF ([phone])  Beverly Goldsmithnne D. Lyn HollingsheadAlexander, MD  Patient Care Team: Margit HanksAlexander, Tamicka Shimon D, MD as PCP - General (Internal Medicine)  Extended Emergency Contact Information Primary Emergency Contact: [name],[name] Address: [address]          [place] [id number] [place] Home Phone: [phone] Relation: Daughter Secondary Emergency Contact: [name],[name]  [place] Home Phone: [phone] Relation: Daughter    Allergies: Sulfa antibiotics  Chief Complaint  Patient presents with  . Medical Management of Chronic Issues    Routine Visit    HPI: Patient is 84 y.o. female who is being seen for routine issues of hypertension, COPD, and allergic rhinitis.  Past Medical History:  Diagnosis Date  . Acute on chronic diastolic heart failure (HCC)   . Aneurysm (HCC)    of the eye  . Anxiety 11/30/2010  . Closed fracture of part of upper end of humerus 10/14/2014  . COPD exacerbation (HCC)   . CVA (cerebral infarction) 11/30/2010  . Decubitus ulcer of sacral region, stage 4 (HCC)   . 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 Depression 12/23/2015  . History of stroke 11/25/2013  . Hyperlipidemia   . Hypertension   . Hypertensive heart disease with CHF (congestive heart failure) (HCC) 11/30/2010  . Iron deficiency anemia 12/10/2014  . Leukocytosis 10/15/2014  . Osteoporosis   . Postmenopausal HRT (hormone replacement therapy) 11/30/2010  . Stroke (HCC) 10/2008  . 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 EYE SURGERY    . PARTIAL HYSTERECTOMY  1968    Allergies as of 04/23/2018      Reactions   Sulfa Antibiotics    Cant remember reaction      Medication List       Accurate as of April 23, 2018 11:59 PM. Always use your most recent med list.          amLODipine 10 MG tablet Commonly known as:  NORVASC TAKE 1 TABLET AT BEDTIME   aspirin 81 MG chewable tablet Chew 81 mg by mouth daily.   atenolol 50 MG tablet Commonly known as:  TENORMIN TAKE 1 TABLET TWICE A DAY   atorvastatin 40 MG tablet Commonly known as:  LIPITOR TAKE 1 TABLET DAILY   escitalopram 10 MG tablet Commonly known as:  LEXAPRO Take 15 mg by mouth daily.   ezetimibe 10 MG tablet Commonly known as:  ZETIA Take 10 mg by mouth daily.   ferrous sulfate 325 (65 FE) MG tablet Take 325 mg by mouth daily with breakfast.   fexofenadine 180 MG tablet Commonly known as:  ALLEGRA Take 180 mg by mouth daily.   ipratropium-albuterol 0.5-2.5 (3) MG/3ML Soln Commonly known as:  DUONEB Take 3 mLs by nebulization every 6 (six) hours as needed. wheezing/SOB   LORazepam 0.5 MG tablet Commonly known as:  ATIVAN Take 1 tablet (0.5 mg total) by mouth every 8 (eight) hours.   memantine 10 MG tablet Commonly known as:  NAMENDA Take 10 mg by mouth 2 (two) times daily.   mirtazapine 7.5 MG tablet Commonly known as:  REMERON Take 7.5 mg by mouth at bedtime.   oxyCODONE 5 MG immediate release tablet Commonly known as:  Oxy IR/ROXICODONE Take 1 tablet (5 mg total) by mouth every 6 (six) hours as needed  for severe pain.   polyethylene glycol packet Commonly known as:  MIRALAX / GLYCOLAX Take 17 g by mouth daily. To be hold for diarrhea   potassium chloride SA 20 MEQ tablet Commonly known as:  K-DUR,KLOR-CON Take 20 mEq by mouth daily.   sennosides-docusate sodium 8.6-50 MG tablet Commonly known as:  SENOKOT-S Take 2 tablets by mouth at bedtime.   SYSTANE 0.4-0.3 % Soln Generic drug:  Polyethyl Glycol-Propyl Glycol Apply 2 drops to eye 2 (two) times daily.   TAB-A-VITE Tabs Take 1 tablet by mouth daily.   traMADol 50 MG tablet Commonly known as:  ULTRAM Give 1 tablet by mouth every 8 hours scheduled for pain       No orders of the defined types were  placed in this encounter.   Immunization History  Administered Date(s) Administered  . Influenza,inj,Quad PF,6+ Mos 04/16/2013, 05/06/2014  . Influenza-Unspecified 02/03/2015, 02/08/2016, 03/15/2017  . PPD Test 10/14/2014    Social History   Tobacco Use  . Smoking status: Former Smoker    Packs/day: 0.50    Years: 50.00    Pack years: 25.00  . Smokeless tobacco: Never Used  Substance Use Topics  . Alcohol use: No    Alcohol/week: 0.0 standard drinks    Review of Systems  DATA OBTAINED: from nurse GENERAL:  no fevers, fatigue, appetite changes SKIN: No itching, rash HEENT: No complaint RESPIRATORY: No cough, wheezing, SOB CARDIAC: No chest pain, palpitations, lower extremity edema  GI: No abdominal pain, No N/V/D or constipation, No heartburn or reflux  GU: No dysuria, frequency or urgency, or incontinence  MUSCULOSKELETAL: No unrelieved bone/joint pain NEUROLOGIC: No headache, dizziness  PSYCHIATRIC: No overt anxiety or sadness  Vitals:   04/23/18 1627  BP: 128/80  Pulse: 71  Resp: 18  Temp: 97.8 F (36.6 C)   Body mass index is 25.16 kg/m. Physical Exam  GENERAL APPEARANCE: Alert, conversant, No acute distress: Is in the hall daily in her wheelchair SKIN: No diaphoresis rash HEENT: Unremarkable RESPIRATORY: Breathing is even, unlabored.  CARDIOVASCULAR:  No peripheral edema  GASTROINTESTINAL: Abdomen is not distended   GENITOURINARY: Bladder  not distended  MUSCULOSKELETAL: No abnormal joints or musculature NEUROLOGIC: Cranial nerves 2-12 grossly intact. Moves all extremities PSYCHIATRIC: Mood and affect appropriate to situation, no behavioral issues  Patient Active Problem List   Diagnosis Date Noted  . Allergic rhinitis 03/31/2018  . Osteoporosis 03/18/2016  . Depression 12/23/2015  . Chronic diastolic heart failure (HCC) 09/05/2015  . Right leg pain 08/05/2015  . COPD (chronic obstructive pulmonary disease) (HCC) 06/25/2015  . Loss of weight  01/21/2015  . Anemia, iron deficiency 01/05/2015  . Acute upper respiratory infection 01/05/2015  . Iron deficiency anemia 12/10/2014  . Stomach discomfort 11/30/2014  . Subacute confusional state 11/30/2014  . Acute respiratory failure with hypoxia (HCC) 11/11/2014  . AKI (acute kidney injury) (HCC) 11/11/2014  . Hypernatremia 11/11/2014  . Hypokalemia 11/09/2014  . COPD exacerbation (HCC)   . Acute on chronic diastolic heart failure (HCC)   . Hypoxia   . SOB (shortness of breath)   . Pressure ulcer stage II 11/02/2014  . Palliative care encounter 11/02/2014  . HCAP (healthcare-associated pneumonia) 11/01/2014  . Leukocytosis 10/15/2014  . Closed fracture of part of upper end of humerus 10/14/2014  . Poor balance 11/25/2013  . History of stroke 11/25/2013  . 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 Myalgia 09/05/2012  . Disequilibrium 09/05/2012  . Hypertension, essential 09/05/2012  . 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  Mobility impaired 07/15/2012  . Physical exam, routine 12/07/2011  . Cerumen impaction 04/04/2011  . Trapezius muscle spasm 04/04/2011  . Hypertensive heart disease with CHF (congestive heart failure) (HCC) 11/30/2010  . Hyperlipidemia 11/30/2010  . CVA (cerebral infarction) 11/30/2010  . Postmenopausal HRT (hormone replacement therapy) 11/30/2010  . Anxiety 11/30/2010  . Ankle weakness 11/30/2010    CMP     Component Value Date/Time   NA 139 10/06/2017   K 4.7 10/06/2017   CL 102 10/13/2015 1505   CO2 22 10/13/2015 1505   GLUCOSE 118 (H) 10/13/2015 1505   BUN 20 10/06/2017   CREATININE 0.6 10/06/2017   CREATININE 0.79 10/13/2015 1505   CALCIUM 9.1 10/13/2015 1505   PROT 6.3 10/13/2015 1505   ALBUMIN 3.8 10/13/2015 1505   ALBUMIN 3.8 10/13/2015 1505   AST 23 10/06/2017   ALT 22 10/06/2017   ALKPHOS 125 10/06/2017   BILITOT 0.6 10/13/2015 1505   GFRNONAA >60 11/05/2014 0225   GFRAA >60 11/05/2014 0225   Recent Labs    10/06/17    NA 139  K 4.7  BUN 20  CREATININE 0.6   Recent Labs    10/06/17  AST 23  ALT 22  ALKPHOS 125   Recent Labs    10/06/17  WBC 6.8  HGB 14.6  HCT 43  PLT 200   No results for input(s): CHOL, LDLCALC, TRIG in the last 8760 hours.  Invalid input(s): HCL No results found for: [facility] Lab Results  Component Value Date   TSH 2.60 04/10/2017   Lab Results  Component Value Date   HGBA1C 5.6 04/10/2017   Lab Results  Component Value Date   CHOL 165 04/10/2017   HDL 42 04/10/2017   LDLCALC 95 04/10/2017   LDLDIRECT 190.1 05/06/2014   TRIG 138 04/10/2017   CHOLHDL 7 05/06/2014    Significant Diagnostic Results in last 30 days:  No results found.  Assessment and Plan  Hypertensive heart disease with CHF (congestive heart failure) (HCC) Controlled; continue Norvasc 10 mg daily and Tenormin 50 mg twice daily  COPD (chronic obstructive pulmonary disease) (HCC) No reported exacerbations; continue Allegra 180 mg daily and as needed albuterol  Allergic rhinitis No problems reported; continue Allegra 180 mg daily     Rayshard Schirtzinger D. [name], MD

## 2018-04-27 ENCOUNTER — Encounter: Payer: Self-pay | Admitting: Internal Medicine

## 2018-04-27 NOTE — Assessment & Plan Note (Signed)
No problems reported; continue Allegra 180 mg daily

## 2018-04-27 NOTE — Assessment & Plan Note (Signed)
Controlled; continue Norvasc 10 mg daily and Tenormin 50 mg twice daily 

## 2018-04-27 NOTE — Assessment & Plan Note (Signed)
No reported exacerbations; continue Allegra 180 mg daily and as needed albuterol 

## 2018-04-30 ENCOUNTER — Other Ambulatory Visit: Payer: Self-pay

## 2018-04-30 MED ORDER — LORAZEPAM 0.5 MG PO TABS: 0.5000 mg | ORAL_TABLET | Freq: Three times a day (TID) | ORAL | 2 refills | Status: DC

## 2018-05-13 ENCOUNTER — Other Ambulatory Visit: Payer: Self-pay

## 2018-05-14 MED ORDER — TRAMADOL HCL 50 MG PO TABS: 50.0000 mg | ORAL_TABLET | Freq: Three times a day (TID) | ORAL | 0 refills | Status: DC

## 2018-05-15 ENCOUNTER — Encounter: Payer: Self-pay | Admitting: Internal Medicine

## 2018-05-15 ENCOUNTER — Non-Acute Institutional Stay (SKILLED_NURSING_FACILITY): Payer: Medicare Other | Admitting: Internal Medicine

## 2018-05-15 DIAGNOSIS — H1031 Unspecified acute conjunctivitis, right eye: Secondary | ICD-10-CM

## 2018-05-15 NOTE — Progress Notes (Signed)
:  [facility] Room Number: 411P Place of Service:  SNF (31)  Beverly Black D. Lyn HollingsheadAlexander, MD  Patient Care Team: Margit HanksAlexander, Inza Mikrut D, MD as PCP - General (Internal Medicine)  Extended Emergency Contact Information Primary Emergency Contact: [name],[name] Address: [address]          [place] [id number] [place] Home Phone: [phone] Relation: Daughter Secondary Emergency Contact: [name],[name]  [place] Home Phone: [phone] Relation: Daughter     Allergies: Sulfa antibiotics  Chief Complaint  Patient presents with  . Acute Visit    Conjunctivitis    HPI: Patient is 85 y.o. female who who I saw in the hall and who asked me about her eye.  She showed it to me and it was injected and there was some drainage.  Later when I got to the back hall I saw that she had been started on Polysporin last Friday which was 5 days ago for conjunctivitis via the [employer] nurse.  Nursing denies that patient has had any cold symptoms or cough and I did not detect any cold symptoms while I was examining patient in the hall.  Past Medical History:  Diagnosis Date  . Acute on chronic diastolic heart failure (HCC)   . Aneurysm (HCC)    of the eye  . Anxiety 11/30/2010  . Closed fracture of part of upper end of humerus 10/14/2014  . COPD exacerbation (HCC)   . CVA (cerebral infarction) 11/30/2010  . Decubitus ulcer of sacral region, stage 4 (HCC)   . 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 Depression 12/23/2015  . History of stroke 11/25/2013  . Hyperlipidemia   . Hypertension   . Hypertensive heart disease with CHF (congestive heart failure) (HCC) 11/30/2010  . Iron deficiency anemia 12/10/2014  . Leukocytosis 10/15/2014  . Osteoporosis   . Postmenopausal HRT (hormone replacement therapy) 11/30/2010  . Stroke (HCC) 10/2008  . 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 EYE SURGERY    . PARTIAL HYSTERECTOMY  1968    Allergies as of 05/15/2018      Reactions   Sulfa Antibiotics    Cant remember reaction      Medication List       Accurate as of May 15, 2018  3:17 PM. Always use your most recent med list.        amLODipine 10 MG tablet Commonly known as:  NORVASC TAKE 1 TABLET AT BEDTIME   aspirin 81 MG chewable tablet Chew 81 mg by mouth daily.   atenolol 50 MG tablet Commonly known as:  TENORMIN TAKE 1 TABLET TWICE A DAY   atorvastatin 40 MG tablet Commonly known as:  LIPITOR TAKE 1 TABLET DAILY   escitalopram 10 MG tablet Commonly known as:  LEXAPRO Take 15 mg by mouth daily.   ezetimibe 10 MG tablet Commonly known as:  ZETIA Take 10 mg by mouth daily.   ferrous sulfate 325 (65 FE) MG tablet Take 325 mg by mouth daily with breakfast.   fexofenadine 180 MG tablet Commonly known as:  ALLEGRA Take 180 mg by mouth daily.   ipratropium-albuterol 0.5-2.5 (3) MG/3ML Soln Commonly known as:  DUONEB Take 3 mLs by nebulization every 6 (six) hours as needed. wheezing/SOB   LORazepam 0.5 MG tablet Commonly known as:  ATIVAN Take 1 tablet (0.5  mg total) by mouth every 8 (eight) hours.   memantine 10 MG tablet Commonly known as:  NAMENDA Take 10 mg by mouth 2 (two) times daily.   mirtazapine 7.5 MG tablet Commonly known as:  REMERON Take 7.5 mg by mouth at bedtime.   oxyCODONE 5 MG immediate release tablet Commonly known as:  Oxy IR/ROXICODONE Take 1 tablet (5 mg total) by mouth every 6 (six) hours as needed for severe pain.   polyethylene glycol packet Commonly known as:  MIRALAX / GLYCOLAX Take 17 g by mouth daily. To be hold for diarrhea   potassium chloride SA 20 MEQ tablet Commonly known as:  K-DUR,KLOR-CON Take 20 mEq by mouth daily.   sennosides-docusate sodium 8.6-50 MG tablet Commonly known as:  SENOKOT-S Take 2 tablets by mouth at bedtime.    SYSTANE 0.4-0.3 % Soln Generic drug:  Polyethyl Glycol-Propyl Glycol Apply 2 drops to eye 2 (two) times daily.   TAB-A-VITE Tabs Take 1 tablet by mouth daily.   traMADol 50 MG tablet Commonly known as:  ULTRAM Take 1 tablet (50 mg total) by mouth every 8 (eight) hours. Give 1 tablet by mouth every 8 hours scheduled for pain       No orders of the defined types were placed in this encounter.   Immunization History  Administered Date(s) Administered  . Influenza,inj,Quad PF,6+ Mos 04/16/2013, 05/06/2014  . Influenza-Unspecified 02/03/2015, 02/08/2016, 03/15/2017  . PPD Test 10/14/2014    Social History   Tobacco Use  . Smoking status: Former Smoker    Packs/day: 0.50    Years: 50.00    Pack years: 25.00  . Smokeless tobacco: Never Used  Substance Use Topics  . Alcohol use: No    Alcohol/week: 0.0 standard drinks    Family history is   Family History  Problem Relation Age of Onset  . Stroke Father   . Heart failure Mother       Review of Systems  DATA OBTAINED: from patient, nurse GENERAL:  no fevers, fatigue, appetite changes SKIN: No itching, or rash EYES: No eye pain, +redness,+ discharge EARS: No earache, tinnitus, change in hearing NOSE: No congestion, drainage or bleeding  MOUTH/THROAT: No mouth or tooth pain, No sore throat RESPIRATORY: No cough, wheezing, SOB CARDIAC: No chest pain, palpitations, lower extremity edema  GI: No abdominal pain, No N/V/D or constipation, No heartburn or reflux  GU: No dysuria, frequency or urgency, or incontinence  MUSCULOSKELETAL: No unrelieved bone/joint pain NEUROLOGIC: No headache, dizziness or focal weakness PSYCHIATRIC: No c/o anxiety or sadness   Vitals:   05/15/18 1513  BP: (!) 130/55  Pulse: 95  Resp: 18  Temp: 97.8 F (36.6 C)    SpO2 Readings from Last 1 Encounters:  11/30/17 (!) 85%   Body mass index is 25.75 kg/m.     Physical Exam  GENERAL APPEARANCE: Alert, conversant,  No acute  distress.  SKIN: No diaphoresis rash HEAD: Normocephalic, atraumatic  EYES:R Conjunctiva injected and there is cloudy drainage on upper and lower lid; lids are not swollen; left conjunctiva is normal. Pupils round, reactive. EOMs intact.  EARS: External exam WNL, canals clear. Hearing grossly normal.  NOSE: No deformity or discharge.  MOUTH/THROAT: Lips w/o lesions  RESPIRATORY: Breathing is even, unlabored. Lung sounds are clear   CARDIOVASCULAR: Heart RRR no murmurs, rubs or gallops. No peripheral edema.   GASTROINTESTINAL: Abdomen is soft, non-tender, not distended w/ normal bowel sounds. GENITOURINARY: Bladder non tender, not distended  MUSCULOSKELETAL: No abnormal joints or  musculature NEUROLOGIC:  Cranial nerves 2-12 grossly intact. Moves all extremities  PSYCHIATRIC: Mood and affect appropriate to situation, no behavioral issues  Patient Active Problem List   Diagnosis Date Noted  . Allergic rhinitis 03/31/2018  . Osteoporosis 03/18/2016  . Depression 12/23/2015  . Chronic diastolic heart failure (HCC) 09/05/2015  . Right leg pain 08/05/2015  . COPD (chronic obstructive pulmonary disease) (HCC) 06/25/2015  . Loss of weight 01/21/2015  . Anemia, iron deficiency 01/05/2015  . Acute upper respiratory infection 01/05/2015  . Iron deficiency anemia 12/10/2014  . Stomach discomfort 11/30/2014  . Subacute confusional state 11/30/2014  . Acute respiratory failure with hypoxia (HCC) 11/11/2014  . AKI (acute kidney injury) (HCC) 11/11/2014  . Hypernatremia 11/11/2014  . Hypokalemia 11/09/2014  . COPD exacerbation (HCC)   . Acute on chronic diastolic heart failure (HCC)   . Hypoxia   . SOB (shortness of breath)   . Pressure ulcer stage II 11/02/2014  . Palliative care encounter 11/02/2014  . HCAP (healthcare-associated pneumonia) 11/01/2014  . Leukocytosis 10/15/2014  . Closed fracture of part of upper end of humerus 10/14/2014  . Poor balance 11/25/2013  . History of stroke  11/25/2013  . 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 Myalgia 09/05/2012  . Disequilibrium 09/05/2012  . Hypertension, essential 09/05/2012  . 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 Mobility impaired 07/15/2012  . Physical exam, routine 12/07/2011  . Cerumen impaction 04/04/2011  . Trapezius muscle spasm 04/04/2011  . Hypertensive heart disease with CHF (congestive heart failure) (HCC) 11/30/2010  . Hyperlipidemia 11/30/2010  . CVA (cerebral infarction) 11/30/2010  . Postmenopausal HRT (hormone replacement therapy) 11/30/2010  . Anxiety 11/30/2010  . Ankle weakness 11/30/2010      Labs reviewed: Basic Metabolic Panel:    Component Value Date/Time   NA 139 10/06/2017   K 4.7 10/06/2017   CL 102 10/13/2015 1505   CO2 22 10/13/2015 1505   GLUCOSE 118 (H) 10/13/2015 1505   BUN 20 10/06/2017   CREATININE 0.6 10/06/2017   CREATININE 0.79 10/13/2015 1505   CALCIUM 9.1 10/13/2015 1505   PROT 6.3 10/13/2015 1505   ALBUMIN 3.8 10/13/2015 1505   ALBUMIN 3.8 10/13/2015 1505   AST 23 10/06/2017   ALT 22 10/06/2017   ALKPHOS 125 10/06/2017   BILITOT 0.6 10/13/2015 1505   GFRNONAA >60 11/05/2014 0225   GFRAA >60 11/05/2014 0225    Recent Labs    10/06/17  NA 139  K 4.7  BUN 20  CREATININE 0.6   Liver Function Tests: Recent Labs    10/06/17  AST 23  ALT 22  ALKPHOS 125   No results for input(s): LIPASE, AMYLASE in the last 8760 hours. No results for input(s): AMMONIA in the last 8760 hours. CBC: Recent Labs    10/06/17  WBC 6.8  HGB 14.6  HCT 43  PLT 200   Lipid No results for input(s): CHOL, HDL, LDLCALC, TRIG in the last 8760 hours.  Cardiac Enzymes: No results for input(s): CKTOTAL, CKMB, CKMBINDEX, TROPONINI in the last 8760 hours. BNP: No results for input(s): BNP in the last 8760 hours. No results found for: [facility] Lab Results  Component Value Date   HGBA1C 5.6 04/10/2017   Lab Results  Component Value Date   TSH 2.60 04/10/2017    No results found for: VITAMINB12 No results found for: FOLATE No results found for: IRON, TIBC, FERRITIN  Imaging and Procedures obtained prior to SNF admission: Dg Skull 1-3 Views  Result Date: 08/13/2015 CLINICAL DATA:  Pre MRI.  Look for mental. EXAM: SKULL - 1-3 VIEW COMPARISON:  None. FINDINGS: No radiopaque foreign body. Ok to proceed to MRI. Suspected pineal calcification incidentally noted. IMPRESSION: No radiopaque foreign body. Ok to proceed to MRI. Electronically Signed   By: Simonne Come M.D.   On: 08/13/2015 10:19   Mr Pelvis Wo Contrast  Result Date: 08/13/2015 CLINICAL DATA:  Sacral osteomyelitis. Decubitus ulcers. Generalized weakness. Nonambulatory. EXAM: MRI PELVIS WITHOUT CONTRAST TECHNIQUE: Multiplanar multisequence MR imaging of the pelvis was performed. No intravenous contrast was administered. COMPARISON:  None. FINDINGS: Despite efforts by the technologist and patient, motion artifact is present on today's exam and could not be eliminated. This reduces exam sensitivity and specificity. Disc bulge at L4-5. Indistinctness and potentially edema of the lower coccygeal segment, image 23/9, with edema in the subcutaneous tissues superficial to the lower sacrum and also presacral edema. There is some bandaging dorsally in this vicinity presumably due to wound weeping. There appears to be ulceration extending to and just below the lower coccyx towards the rectum, but I do not see a definite fistula. The rectum is distended with gas. There is low-level edema in the left gluteus medius muscle. Subtle edema along the hip adductor musculature bilaterally. Fifth Upper normal amount of joint fluid in both hips. IMPRESSION: 1. Skin ulceration superficial to and just below the lower coccygeal segment, with suspicion for early osteomyelitis of the lower coccygeal segment given the indistinctness of marginal cortication on image 23/9 as well as the surrounding soft tissue edema. There is also  presacral edema extending anterior to the sacrum and coccyx, without a drainable abscess. 2. Gaseous distention of the rectum. 3. Low-level nonspecific edema in the left gluteus medius muscle and in the hip adductor musculature. 4. Degenerative disc disease at L4-5. 5. Motion artifact reduces diagnostic sensitivity and specificity. Electronically Signed   By: Gaylyn Rong M.D.   On: 08/13/2015 12:30     Not all labs, radiology exams or other studies done during hospitalization come through on my EPIC note; however they are reviewed by me.    Assessment and Plan  Right conjunctivitis-not responding to Polysporin; changed to ofloxacin 4 times daily for 7 days    [name]. [name], MD

## 2018-05-27 ENCOUNTER — Encounter: Payer: Self-pay | Admitting: Internal Medicine

## 2018-05-27 ENCOUNTER — Non-Acute Institutional Stay (SKILLED_NURSING_FACILITY): Payer: Medicare Other | Admitting: Internal Medicine

## 2018-05-27 DIAGNOSIS — F419 Anxiety disorder, unspecified: Secondary | ICD-10-CM | POA: Diagnosis not present

## 2018-05-27 DIAGNOSIS — F32A Depression, unspecified: Secondary | ICD-10-CM

## 2018-05-27 DIAGNOSIS — F329 Major depressive disorder, single episode, unspecified: Secondary | ICD-10-CM | POA: Diagnosis not present

## 2018-05-27 DIAGNOSIS — R627 Adult failure to thrive: Secondary | ICD-10-CM

## 2018-05-27 NOTE — Progress Notes (Signed)
[facility]  Thurston Holenne D. Lyn HollingsheadAlexander, MD  Patient Care Team: Margit HanksAlexander, Reshanda Lewey D, MD as PCP - General (Internal Medicine)  Extended Emergency Contact Information Primary Emergency Contact: [name],[name] Address: [address]          [place] [id number] [place] Home Phone: [phone] Relation: Daughter Secondary Emergency Contact: [name],[name]  [place] Home Phone: [phone] Relation: Daughter    Allergies: Sulfa antibiotics  Chief Complaint  Patient presents with  . Medical Management of Chronic Issues    Routine Visit    HPI: Patient is 85 y.o. female who is being seen for routine issues of depression, anxiety, and failure to thrive.  Past Medical History:  Diagnosis Date  . Acute on chronic diastolic heart failure (HCC)   . Aneurysm (HCC)    of the eye  . Anxiety 11/30/2010  . Closed fracture of part of upper end of humerus 10/14/2014  . COPD exacerbation (HCC)   . CVA (cerebral infarction) 11/30/2010  . Decubitus ulcer of sacral region, stage 4 (HCC)   . 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 Depression 12/23/2015  . History of stroke 11/25/2013  . Hyperlipidemia   . Hypertension   . Hypertensive heart disease with CHF (congestive heart failure) (HCC) 11/30/2010  . Iron deficiency anemia 12/10/2014  . Leukocytosis 10/15/2014  . Osteoporosis   . Postmenopausal HRT (hormone replacement therapy) 11/30/2010  . Stroke (HCC) 10/2008  . 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 EYE SURGERY    . PARTIAL HYSTERECTOMY  1968    Allergies as of 05/27/2018      Reactions   Sulfa Antibiotics    Cant remember reaction      Medication List       Accurate as of May 27, 2018 11:59 PM. Always use your most recent med list.        amLODipine 10 MG tablet Commonly known as:  NORVASC TAKE 1 TABLET AT  BEDTIME   aspirin 81 MG chewable tablet Chew 81 mg by mouth daily.   atenolol 50 MG tablet Commonly known as:  TENORMIN TAKE 1 TABLET TWICE A DAY   atorvastatin 40 MG tablet Commonly known as:  LIPITOR TAKE 1 TABLET DAILY   escitalopram 10 MG tablet Commonly known as:  LEXAPRO Take 15 mg by mouth daily.   ezetimibe 10 MG tablet Commonly known as:  ZETIA Take 10 mg by mouth daily.   ferrous sulfate 325 (65 FE) MG tablet Take 325 mg by mouth daily with breakfast.   fexofenadine 180 MG tablet Commonly known as:  ALLEGRA Take 180 mg by mouth daily.   ipratropium-albuterol 0.5-2.5 (3) MG/3ML Soln Commonly known as:  DUONEB Take 3 mLs by nebulization every 6 (six) hours as needed. wheezing/SOB   LORazepam 0.5 MG tablet Commonly known as:  ATIVAN Take 1 tablet (0.5 mg total) by mouth every 8 (eight) hours.   memantine 10 MG tablet Commonly known as:  NAMENDA Take 10 mg by mouth 2 (two) times daily.   mirtazapine 7.5 MG tablet Commonly known as:  REMERON Take 7.5 mg by mouth at bedtime.   oxyCODONE 5 MG immediate release tablet Commonly known as:  Oxy IR/ROXICODONE Take 1 tablet (5 mg total) by mouth every 6 (six) hours as needed for severe pain.   polyethylene glycol packet Commonly known as:  MIRALAX /  GLYCOLAX Take 17 g by mouth daily. To be hold for diarrhea   potassium chloride SA 20 MEQ tablet Commonly known as:  K-DUR,KLOR-CON Take 20 mEq by mouth daily.   sennosides-docusate sodium 8.6-50 MG tablet Commonly known as:  SENOKOT-S Take 2 tablets by mouth at bedtime.   SYSTANE 0.4-0.3 % Soln Generic drug:  Polyethyl Glycol-Propyl Glycol Apply 2 drops to eye 2 (two) times daily.   TAB-A-VITE Tabs Take 1 tablet by mouth daily.   traMADol 50 MG tablet Commonly known as:  ULTRAM Take 1 tablet (50 mg total) by mouth every 8 (eight) hours. Give 1 tablet by mouth every 8 hours scheduled for pain       No orders of the defined types were placed in this  encounter.   Immunization History  Administered Date(s) Administered  . Influenza,inj,Quad PF,6+ Mos 04/16/2013, 05/06/2014  . Influenza-Unspecified 02/03/2015, 02/08/2016, 03/15/2017  . PPD Test 10/14/2014    Social History   Tobacco Use  . Smoking status: Former Smoker    Packs/day: 0.50    Years: 50.00    Pack years: 25.00  . Smokeless tobacco: Never Used  Substance Use Topics  . Alcohol use: No    Alcohol/week: 0.0 standard drinks    Review of Systems  DATA OBTAINED: from patient, nurse GENERAL:  no fevers, fatigue, appetite changes SKIN: No itching, rash HEENT: No complaint RESPIRATORY: No cough, wheezing, SOB CARDIAC: No chest pain, palpitations, lower extremity edema  GI: No abdominal pain, No N/V/D or constipation, No heartburn or reflux  GU: No dysuria, frequency or urgency, or incontinence  MUSCULOSKELETAL: No unrelieved bone/joint pain NEUROLOGIC: No headache, dizziness  PSYCHIATRIC: No overt anxiety or sadness  Vitals:   05/27/18 1143  BP: 125/68  Pulse: 68  Resp: 19  Temp: (!) 97 F (36.1 C)   Body mass index is 25.75 kg/m. Physical Exam  GENERAL APPEARANCE: Alert, moderately conversant, No acute distress; in the hall in her wheelchair daily SKIN: No diaphoresis rash HEENT: Unremarkable RESPIRATORY: Breathing is even, unlabored.   CARDIOVASCULAR:  No peripheral edema  GASTROINTESTINAL: Abdomen is not distended   GENITOURINARY: Bladder not distended  MUSCULOSKELETAL: No abnormal joints or musculature NEUROLOGIC: Cranial nerves 2-12 grossly intact. Moves all extremities PSYCHIATRIC: Mood and affect appropriate to situation but quiet, no behavioral issues  Patient Active Problem List   Diagnosis Date Noted  . Failure to thrive in adult 05/28/2018  . Allergic rhinitis 03/31/2018  . Osteoporosis 03/18/2016  . Depression 12/23/2015  . Chronic diastolic heart failure (HCC) 09/05/2015  . Right leg pain 08/05/2015  . COPD (chronic obstructive  pulmonary disease) (HCC) 06/25/2015  . Loss of weight 01/21/2015  . Anemia, iron deficiency 01/05/2015  . Acute upper respiratory infection 01/05/2015  . Iron deficiency anemia 12/10/2014  . Stomach discomfort 11/30/2014  . Subacute confusional state 11/30/2014  . Acute respiratory failure with hypoxia (HCC) 11/11/2014  . AKI (acute kidney injury) (HCC) 11/11/2014  . Hypernatremia 11/11/2014  . Hypokalemia 11/09/2014  . COPD exacerbation (HCC)   . Acute on chronic diastolic heart failure (HCC)   . Hypoxia   . SOB (shortness of breath)   . Pressure ulcer stage II 11/02/2014  . Palliative care encounter 11/02/2014  . HCAP (healthcare-associated pneumonia) 11/01/2014  . Leukocytosis 10/15/2014  . Closed fracture of part of upper end of humerus 10/14/2014  . Poor balance 11/25/2013  . History of stroke 11/25/2013  . 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 Myalgia 09/05/2012  . Disequilibrium 09/05/2012  .  Hypertension, essential 09/05/2012  . 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 Mobility impaired 07/15/2012  . Physical exam, routine 12/07/2011  . Cerumen impaction 04/04/2011  . Trapezius muscle spasm 04/04/2011  . Hypertensive heart disease with CHF (congestive heart failure) (HCC) 11/30/2010  . Hyperlipidemia 11/30/2010  . CVA (cerebral infarction) 11/30/2010  . Postmenopausal HRT (hormone replacement therapy) 11/30/2010  . Anxiety 11/30/2010  . Ankle weakness 11/30/2010    CMP     Component Value Date/Time   NA 139 10/06/2017   K 4.7 10/06/2017   CL 102 10/13/2015 1505   CO2 22 10/13/2015 1505   GLUCOSE 118 (H) 10/13/2015 1505   BUN 20 10/06/2017   CREATININE 0.6 10/06/2017   CREATININE 0.79 10/13/2015 1505   CALCIUM 9.1 10/13/2015 1505   PROT 6.3 10/13/2015 1505   ALBUMIN 3.8 10/13/2015 1505   ALBUMIN 3.8 10/13/2015 1505   AST 23 10/06/2017   ALT 22 10/06/2017   ALKPHOS 125 10/06/2017   BILITOT 0.6 10/13/2015 1505   GFRNONAA >60 11/05/2014 0225    GFRAA >60 11/05/2014 0225   Recent Labs    10/06/17  NA 139  K 4.7  BUN 20  CREATININE 0.6   Recent Labs    10/06/17  AST 23  ALT 22  ALKPHOS 125   Recent Labs    10/06/17  WBC 6.8  HGB 14.6  HCT 43  PLT 200   No results for input(s): CHOL, LDLCALC, TRIG in the last 8760 hours.  Invalid input(s): HCL No results found for: [facility] Lab Results  Component Value Date   TSH 2.60 04/10/2017   Lab Results  Component Value Date   HGBA1C 5.6 04/10/2017   Lab Results  Component Value Date   CHOL 165 04/10/2017   HDL 42 04/10/2017   LDLCALC 95 04/10/2017   LDLDIRECT 190.1 05/06/2014   TRIG 138 04/10/2017   CHOLHDL 7 05/06/2014    Significant Diagnostic Results in last 30 days:  No results found.  Assessment and Plan  Depression Continues to be stable; continue Lexapro 50 mg daily  Anxiety Appears controlled with no side effects; continue Ativan 0.5 mg every 8 hours  Failure to thrive in adult At the beginning stages, not eating and drinking as well, liberalizing diet adding snacks and is on Remeron 7.5 mg nightly to help stimulate appetite; continue current regimen and continue supportive care     Yassmine Tamm D. Lyn HollingsheadAlexander, MD

## 2018-05-28 ENCOUNTER — Encounter: Payer: Self-pay | Admitting: Internal Medicine

## 2018-05-28 DIAGNOSIS — R627 Adult failure to thrive: Secondary | ICD-10-CM | POA: Insufficient documentation

## 2018-05-28 NOTE — Assessment & Plan Note (Signed)
Continues to be stable; continue Lexapro 50 mg daily

## 2018-05-28 NOTE — Assessment & Plan Note (Signed)
Appears controlled with no side effects; continue Ativan 0.5 mg every 8 hours

## 2018-05-28 NOTE — Assessment & Plan Note (Signed)
At the beginning stages, not eating and drinking as well, liberalizing diet adding snacks and is on Remeron 7.5 mg nightly to help stimulate appetite; continue current regimen and continue supportive care

## 2018-05-30 ENCOUNTER — Other Ambulatory Visit: Payer: Self-pay

## 2018-06-10 ENCOUNTER — Other Ambulatory Visit: Payer: Self-pay

## 2018-06-10 MED ORDER — TRAMADOL HCL 50 MG PO TABS: 50.0000 mg | ORAL_TABLET | Freq: Three times a day (TID) | ORAL | 0 refills | Status: DC

## 2018-06-25 ENCOUNTER — Non-Acute Institutional Stay (SKILLED_NURSING_FACILITY): Payer: Medicare Other | Admitting: Internal Medicine

## 2018-06-25 ENCOUNTER — Encounter: Payer: Self-pay | Admitting: Internal Medicine

## 2018-06-25 DIAGNOSIS — M818 Other osteoporosis without current pathological fracture: Secondary | ICD-10-CM | POA: Diagnosis not present

## 2018-06-25 DIAGNOSIS — E782 Mixed hyperlipidemia: Secondary | ICD-10-CM

## 2018-06-25 DIAGNOSIS — I5032 Chronic diastolic (congestive) heart failure: Secondary | ICD-10-CM

## 2018-06-25 NOTE — Progress Notes (Signed)
Location:  [facility] Room Number: 411P Place of Service:  SNF [phone])  Beverly Black. [name], MD  Patient Care Team: [name], MD as PCP - General (Internal Medicine)  Extended Emergency Contact Information Primary Emergency Contact: [name],[name] Address: [address]          [name] [address] [name] Home Phone: [phone] Relation: Daughter Secondary Emergency Contact: [name] Home Phone: [phone] Relation: Daughter    Allergies: Sulfa antibiotics  Chief Complaint  Patient presents with  . Medical Management of Chronic Issues    Routine visit    HPI: Patient is 85 y.o. female who is being seen for routine issues of congestive heart failure, osteoporosis, and hyperlipidemia.  Past Medical History:  Diagnosis Date  . Acute on chronic diastolic heart failure (HCC)   . Aneurysm (HCC)    of the eye  . Anxiety 11/30/2010  . Closed fracture of part of upper end of humerus 10/14/2014  . COPD exacerbation (HCC)   . CVA (cerebral infarction) 11/30/2010  . Decubitus ulcer of sacral region, stage 4 (HCC)   . 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 Depression 12/23/2015  . History of stroke 11/25/2013  . Hyperlipidemia   . Hypertension   . Hypertensive heart disease with CHF (congestive heart failure) (HCC) 11/30/2010  . Iron deficiency anemia 12/10/2014  . Leukocytosis 10/15/2014  . Osteoporosis   . Postmenopausal HRT (hormone replacement therapy) 11/30/2010  . Stroke (HCC) 10/2008  . 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 EYE SURGERY    . PARTIAL HYSTERECTOMY  1968    Allergies as of 06/25/2018      Reactions   Sulfa Antibiotics    Cant remember reaction      Medication List       Accurate as of June 25, 2018 11:59 PM. Always use your most recent  med list.        amLODipine 10 MG tablet Commonly known as:  NORVASC TAKE 1 TABLET AT BEDTIME   aspirin 81 MG chewable tablet Chew 81 mg by mouth daily.   atenolol 50 MG tablet Commonly known as:  TENORMIN TAKE 1 TABLET TWICE A DAY   atorvastatin 40 MG tablet Commonly known as:  LIPITOR TAKE 1 TABLET DAILY   escitalopram 10 MG tablet Commonly known as:  LEXAPRO Take 15 mg by mouth daily.   ezetimibe 10 MG tablet Commonly known as:  ZETIA Take 10 mg by mouth daily.   ferrous sulfate 325 (65 FE) MG tablet Take 325 mg by mouth daily with breakfast.   ipratropium-albuterol 0.5-2.5 (3) MG/3ML Soln Commonly known as:  DUONEB Take 3 mLs by nebulization every 6 (six) hours as needed. wheezing/SOB   LORazepam 0.5 MG tablet Commonly known as:  ATIVAN Take 1 tablet (0.5 mg total) by mouth every 8 (eight) hours.   memantine 10 MG tablet Commonly known as:  NAMENDA Take 10 mg by mouth 2 (two) times daily.   mirtazapine 7.5 MG tablet Commonly known as:  REMERON Take 7.5 mg by mouth at bedtime.   oxyCODONE 5 MG immediate release tablet Commonly known as:  Oxy IR/ROXICODONE Take 1 tablet (5 mg total) by mouth every 6 (six) hours as needed for severe pain.   polyethylene glycol packet Commonly known as:  MIRALAX / GLYCOLAX Take 17  g by mouth daily. To be hold for diarrhea   potassium chloride SA 20 MEQ tablet Commonly known as:  K-DUR,KLOR-CON Take 20 mEq by mouth daily.   sennosides-docusate sodium 8.6-50 MG tablet Commonly known as:  SENOKOT-S Take 2 tablets by mouth at bedtime.   SYSTANE 0.4-0.3 % Soln Generic drug:  Polyethyl Glycol-Propyl Glycol Apply 2 drops to eye 2 (two) times daily.   TAB-A-VITE Tabs Take 1 tablet by mouth daily.   traMADol 50 MG tablet Commonly known as:  ULTRAM Take 1 tablet (50 mg total) by mouth every 8 (eight) hours. Give 1 tablet by mouth every 8 hours scheduled for pain       No orders of the defined types were placed in this  encounter.   Immunization History  Administered Date(s) Administered  . Influenza,inj,Quad PF,6+ Mos 04/16/2013, 05/06/2014  . Influenza-Unspecified 02/03/2015, 02/08/2016, 03/15/2017  . PPD Test 10/14/2014    Social History   Tobacco Use  . Smoking status: Former Smoker    Packs/day: 0.50    Years: 50.00    Pack years: 25.00  . Smokeless tobacco: Never Used  Substance Use Topics  . Alcohol use: No    Alcohol/week: 0.0 standard drinks    Review of Systems  DATA OBTAINED: from patient-limited; nursing-no acute concerns GENERAL:  no fevers, fatigue, appetite changes SKIN: No itching, rash HEENT: No complaint RESPIRATORY: No cough, wheezing, SOB CARDIAC: No chest pain, palpitations, lower extremity edema  GI: No abdominal pain, No N/V/D or constipation, No heartburn or reflux  GU: No dysuria, frequency or urgency, or incontinence  MUSCULOSKELETAL: No unrelieved bone/joint pain NEUROLOGIC: No headache, dizziness  PSYCHIATRIC: No overt anxiety or sadness  Vitals:   06/25/18 0953  BP: 137/75  Pulse: 70  Resp: 18  Temp: 98.4 F (36.9 C)   Body mass index is 26.09 kg/m. Physical Exam  GENERAL APPEARANCE: Alert, conversant, No acute distress; out in hall daily in her wheelchair SKIN: No diaphoresis rash HEENT: Unremarkable RESPIRATORY: Breathing is even, unlabored. Lung sounds are clear   CARDIOVASCULAR: Heart RRR no murmurs, rubs or gallops. No peripheral edema  GASTROINTESTINAL: Abdomen is soft, non-tender, not distended w/ normal bowel sounds.  GENITOURINARY: Bladder non tender, not distended  MUSCULOSKELETAL: No abnormal joints or musculature NEUROLOGIC: Cranial nerves 2-12 grossly intact. Moves all extremities PSYCHIATRIC: Mood and affect dementia, no behavioral issues  Patient Active Problem List   Diagnosis Date Noted  . Failure to thrive in adult 05/28/2018  . Allergic rhinitis 03/31/2018  . Osteoporosis 03/18/2016  . Depression 12/23/2015  . Chronic  diastolic heart failure (HCC) 09/05/2015  . Right leg pain 08/05/2015  . COPD (chronic obstructive pulmonary disease) (HCC) 06/25/2015  . Loss of weight 01/21/2015  . Anemia, iron deficiency 01/05/2015  . Acute upper respiratory infection 01/05/2015  . Iron deficiency anemia 12/10/2014  . Stomach discomfort 11/30/2014  . Subacute confusional state 11/30/2014  . Acute respiratory failure with hypoxia (HCC) 11/11/2014  . AKI (acute kidney injury) (HCC) 11/11/2014  . Hypernatremia 11/11/2014  . Hypokalemia 11/09/2014  . COPD exacerbation (HCC)   . Acute on chronic diastolic heart failure (HCC)   . Hypoxia   . SOB (shortness of breath)   . Pressure ulcer stage II 11/02/2014  . Palliative care encounter 11/02/2014  . HCAP (healthcare-associated pneumonia) 11/01/2014  . Leukocytosis 10/15/2014  . Closed fracture of part of upper end of humerus 10/14/2014  . Poor balance 11/25/2013  . History of stroke 11/25/2013  . Vascular parkinsonism (HCC)  11/25/2013  . Myalgia 09/05/2012  . Disequilibrium 09/05/2012  . Hypertension, essential 09/05/2012  . 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 Mobility impaired 07/15/2012  . Physical exam, routine 12/07/2011  . Cerumen impaction 04/04/2011  . Trapezius muscle spasm 04/04/2011  . Hypertensive heart disease with CHF (congestive heart failure) (HCC) 11/30/2010  . Hyperlipidemia 11/30/2010  . CVA (cerebral infarction) 11/30/2010  . Postmenopausal HRT (hormone replacement therapy) 11/30/2010  . Anxiety 11/30/2010  . Ankle weakness 11/30/2010    CMP     Component Value Date/Time   NA 139 10/06/2017   K 4.7 10/06/2017   CL 102 10/13/2015 1505   CO2 22 10/13/2015 1505   GLUCOSE 118 (H) 10/13/2015 1505   BUN 20 10/06/2017   CREATININE 0.6 10/06/2017   CREATININE 0.79 10/13/2015 1505   CALCIUM 9.1 10/13/2015 1505   PROT 6.3 10/13/2015 1505   ALBUMIN 3.8 10/13/2015 1505   ALBUMIN 3.8 10/13/2015 1505   AST 23 10/06/2017   ALT 22  10/06/2017   ALKPHOS 125 10/06/2017   BILITOT 0.6 10/13/2015 1505   GFRNONAA >60 11/05/2014 0225   GFRAA >60 11/05/2014 0225   Recent Labs    10/06/17  NA 139  K 4.7  BUN 20  CREATININE 0.6   Recent Labs    10/06/17  AST 23  ALT 22  ALKPHOS 125   Recent Labs    10/06/17  WBC 6.8  HGB 14.6  HCT 43  PLT 200   No results for input(s): CHOL, LDLCALC, TRIG in the last 8760 hours.  Invalid input(s): HCL No results found for: [facility] Lab Results  Component Value Date   TSH 2.60 04/10/2017   Lab Results  Component Value Date   HGBA1C 5.6 04/10/2017   Lab Results  Component Value Date   CHOL 165 04/10/2017   HDL 42 04/10/2017   LDLCALC 95 04/10/2017   LDLDIRECT 190.1 05/06/2014   TRIG 138 04/10/2017   CHOLHDL 7 05/06/2014    Significant Diagnostic Results in last 30 days:  No results found.  Assessment and Plan  Chronic diastolic heart failure (HCC) No reported exacerbations; continue atenolol twice daily and ASA 81 mg daily.  Weights are followed, patient is not on a diuretic  Osteoporosis Continues without fractures; continue vitamin D 50,000 units weekly  Hyperlipidemia Recent LDL controlled as well as triglycerides; continue Zetia 10 mg daily and Lipitor 40 mg daily    Riona Lahti D. Lyn HollingsheadAlexander, MD

## 2018-06-26 ENCOUNTER — Encounter: Payer: Self-pay | Admitting: Internal Medicine

## 2018-06-26 NOTE — Assessment & Plan Note (Signed)
No reported exacerbations; continue atenolol twice daily and ASA 81 mg daily.  Weights are followed, patient is not on a diuretic

## 2018-06-26 NOTE — Assessment & Plan Note (Signed)
Continues without fractures; continue vitamin D 50,000 units weekly

## 2018-06-26 NOTE — Assessment & Plan Note (Signed)
Recent LDL controlled as well as triglycerides; continue Zetia 10 mg daily and Lipitor 40 mg daily

## 2018-07-08 ENCOUNTER — Non-Acute Institutional Stay (SKILLED_NURSING_FACILITY): Payer: Medicare Other | Admitting: Internal Medicine

## 2018-07-08 DIAGNOSIS — H1031 Unspecified acute conjunctivitis, right eye: Secondary | ICD-10-CM

## 2018-07-08 LAB — HEPATIC FUNCTION PANEL
ALT: 18 (ref 7–35)
AST: 20 (ref 13–35)
Alkaline Phosphatase: 91 (ref 25–125)
Bilirubin, Total: 0.3

## 2018-07-08 LAB — BASIC METABOLIC PANEL
BUN: 20 (ref 4–21)
CREATININE: 0.6 (ref 0.5–1.1)
Glucose: 85
Potassium: 4.3 (ref 3.4–5.3)
Sodium: 143 (ref 137–147)

## 2018-07-08 LAB — CBC AND DIFFERENTIAL
HCT: 36 (ref 36–46)
Hemoglobin: 12.4 (ref 12.0–16.0)
Platelets: 173 (ref 150–399)
WBC: 6.1

## 2018-07-08 LAB — VITAMIN D 25 HYDROXY (VIT D DEFICIENCY, FRACTURES): Vit D, 25-Hydroxy: 27.31

## 2018-07-08 LAB — LIPID PANEL
Cholesterol: 139 (ref 0–200)
HDL: 35 (ref 35–70)
LDL Cholesterol: 80
LDl/HDL Ratio: 3.9
TRIGLYCERIDES: 138 (ref 40–160)

## 2018-07-08 LAB — TSH: TSH: 4.13 (ref 0.41–5.90)

## 2018-07-10 ENCOUNTER — Encounter: Payer: Self-pay | Admitting: Internal Medicine

## 2018-07-10 NOTE — Progress Notes (Signed)
Location:  [facility]   Place of Service:  SNF (31)SNF  [name], MD  Patient Care Team: [name], MD as PCP - General (Internal Medicine)  Extended Emergency Contact Information Primary Emergency Contact: [name],[name] Address: [address]          [name] [address] [name] Home Phone: [phone] Relation: Daughter Secondary Emergency Contact: [name] [place] Home Phone: [phone] Relation: Daughter    Allergies: Sulfa antibiotics  Chief Complaint  Patient presents with  . Acute Visit    HPI: Patient is 85 y.o. female who nursing asked me to see because she has right eye redness with exudate noted yesterday.  Patient has not had any fever chills cold cough runny nose.  Past Medical History:  Diagnosis Date  . Acute on chronic diastolic heart failure (HCC)   . Aneurysm (HCC)    of the eye  . Anxiety 11/30/2010  . Closed fracture of part of upper end of humerus 10/14/2014  . COPD exacerbation (HCC)   . CVA (cerebral infarction) 11/30/2010  . Decubitus ulcer of sacral region, stage 4 (HCC)   . 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 Depression 12/23/2015  . History of stroke 11/25/2013  . Hyperlipidemia   . Hypertension   . Hypertensive heart disease with CHF (congestive heart failure) (HCC) 11/30/2010  . Iron deficiency anemia 12/10/2014  . Leukocytosis 10/15/2014  . Osteoporosis   . Postmenopausal HRT (hormone replacement therapy) 11/30/2010  . Stroke (HCC) 10/2008  . 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 EYE SURGERY    . PARTIAL HYSTERECTOMY  1968    Allergies as of 07/08/2018      Reactions   Sulfa Antibiotics    Cant remember reaction      Medication List       Accurate as of July 08, 2018 11:59 PM. Always use your most recent med list.          amLODipine 10 MG tablet Commonly known as:  NORVASC TAKE 1 TABLET AT BEDTIME   aspirin 81 MG chewable tablet Chew 81 mg by mouth daily.   atenolol 50 MG tablet Commonly known as:  TENORMIN TAKE 1 TABLET TWICE A DAY   atorvastatin 40 MG tablet Commonly known as:  LIPITOR TAKE 1 TABLET DAILY   escitalopram 10 MG tablet Commonly known as:  LEXAPRO Take 15 mg by mouth daily.   ezetimibe 10 MG tablet Commonly known as:  ZETIA Take 10 mg by mouth daily.   ferrous sulfate 325 (65 FE) MG tablet Take 325 mg by mouth daily with breakfast.   ipratropium-albuterol 0.5-2.5 (3) MG/3ML Soln Commonly known as:  DUONEB Take 3 mLs by nebulization every 6 (six) hours as needed. wheezing/SOB   LORazepam 0.5 MG tablet Commonly known as:  ATIVAN Take 1 tablet (0.5 mg total) by mouth every 8 (eight) hours.   memantine 10 MG tablet Commonly known as:  NAMENDA Take 10 mg by mouth 2 (two) times daily.   mirtazapine 7.5 MG tablet Commonly known as:  REMERON Take 7.5 mg by mouth at bedtime.   oxyCODONE 5 MG immediate release tablet Commonly known as:  Oxy IR/ROXICODONE Take 1 tablet (5 mg total) by mouth every 6 (six) hours as needed for severe pain.   polyethylene glycol packet Commonly known as:  MIRALAX /  GLYCOLAX Take 17 g by mouth daily. To be hold for diarrhea   potassium chloride SA 20 MEQ tablet Commonly known as:  K-DUR,KLOR-CON Take 20 mEq by mouth daily.   sennosides-docusate sodium 8.6-50 MG tablet Commonly known as:  SENOKOT-S Take 2 tablets by mouth at bedtime.   SYSTANE 0.4-0.3 % Soln Generic drug:  Polyethyl Glycol-Propyl Glycol Apply 2 drops to eye 2 (two) times daily.   TAB-A-VITE Tabs Take 1 tablet by mouth daily.   traMADol 50 MG tablet Commonly known as:  ULTRAM Take 1 tablet (50 mg total) by mouth every 8 (eight) hours. Give 1 tablet by mouth every 8 hours scheduled for pain       No orders of the defined types were placed in this  encounter.   Immunization History  Administered Date(s) Administered  . Influenza,inj,Quad PF,6+ Mos 04/16/2013, 05/06/2014  . Influenza-Unspecified 02/03/2015, 02/08/2016, 03/15/2017  . PPD Test 10/14/2014    Social History   Tobacco Use  . Smoking status: Former Smoker    Packs/day: 0.50    Years: 50.00    Pack years: 25.00  . Smokeless tobacco: Never Used  Substance Use Topics  . Alcohol use: No    Alcohol/week: 0.0 standard drinks    Review of Systems  DATA OBTAINED: from patient, nurse GENERAL:  no fevers, fatigue, appetite changes SKIN: No itching, rash HEENT: Redness right eye with some pain and discharge RESPIRATORY: No cough, wheezing, SOB CARDIAC: No chest pain, palpitations, lower extremity edema  GI: No abdominal pain, No N/V/D or constipation, No heartburn or reflux  GU: No dysuria, frequency or urgency, or incontinence  MUSCULOSKELETAL: No unrelieved bone/joint pain NEUROLOGIC: No headache, dizziness  PSYCHIATRIC: No overt anxiety or sadness  Vitals:   07/10/18 2230  BP: 133/65  Pulse: 79  Resp: 18  Temp: (!) 96.7 F (35.9 C)   There is no height or weight on file to calculate BMI. Physical Exam  GENERAL APPEARANCE: Alert, conversant, No acute distress  SKIN: No diaphoresis rash HEENT: Right conjunctiva injected and small amount of slightly cloudy exudate upper and lower lid; there is no swelling to the lids redness or nodules RESPIRATORY: Breathing is even, unlabored. Lung sounds are clear   CARDIOVASCULAR: Heart RRR no murmurs, rubs or gallops. No peripheral edema  GASTROINTESTINAL: Abdomen is soft, non-tender, not distended w/ normal bowel sounds.  GENITOURINARY: Bladder non tender, not distended  MUSCULOSKELETAL: No abnormal joints or musculature NEUROLOGIC: Cranial nerves 2-12 grossly intact. Moves all extremities PSYCHIATRIC: Mood and affect appropriate to situation, no behavioral issues  Patient Active Problem List   Diagnosis Date  Noted  . Failure to thrive in adult 05/28/2018  . Allergic rhinitis 03/31/2018  . Osteoporosis 03/18/2016  . Depression 12/23/2015  . Chronic diastolic heart failure (HCC) 09/05/2015  . Right leg pain 08/05/2015  . COPD (chronic obstructive pulmonary disease) (HCC) 06/25/2015  . Loss of weight 01/21/2015  . Anemia, iron deficiency 01/05/2015  . Acute upper respiratory infection 01/05/2015  . Iron deficiency anemia 12/10/2014  . Stomach discomfort 11/30/2014  . Subacute confusional state 11/30/2014  . Acute respiratory failure with hypoxia (HCC) 11/11/2014  . AKI (acute kidney injury) (HCC) 11/11/2014  . Hypernatremia 11/11/2014  . Hypokalemia 11/09/2014  . COPD exacerbation (HCC)   . Acute on chronic diastolic heart failure (HCC)   . Hypoxia   . SOB (shortness of breath)   . Pressure ulcer stage II 11/02/2014  . Palliative care encounter 11/02/2014  . HCAP (healthcare-associated pneumonia) 11/01/2014  .  Leukocytosis 10/15/2014  . Closed fracture of part of upper end of humerus 10/14/2014  . Poor balance 11/25/2013  . History of stroke 11/25/2013  . 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 Myalgia 09/05/2012  . Disequilibrium 09/05/2012  . Hypertension, essential 09/05/2012  . 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 Mobility impaired 07/15/2012  . Physical exam, routine 12/07/2011  . Cerumen impaction 04/04/2011  . Trapezius muscle spasm 04/04/2011  . Hypertensive heart disease with CHF (congestive heart failure) (HCC) 11/30/2010  . Hyperlipidemia 11/30/2010  . CVA (cerebral infarction) 11/30/2010  . Postmenopausal HRT (hormone replacement therapy) 11/30/2010  . Anxiety 11/30/2010  . Ankle weakness 11/30/2010    CMP     Component Value Date/Time   NA 143 07/08/2018   K 4.3 07/08/2018   CL 102 10/13/2015 1505   CO2 22 10/13/2015 1505   GLUCOSE 118 (H) 10/13/2015 1505   BUN 20 07/08/2018   CREATININE 0.6 07/08/2018   CREATININE 0.79 10/13/2015 1505   CALCIUM  9.1 10/13/2015 1505   PROT 6.3 10/13/2015 1505   ALBUMIN 3.8 10/13/2015 1505   ALBUMIN 3.8 10/13/2015 1505   AST 20 07/08/2018   ALT 18 07/08/2018   ALKPHOS 91 07/08/2018   BILITOT 0.6 10/13/2015 1505   GFRNONAA >60 11/05/2014 0225   GFRAA >60 11/05/2014 0225   Recent Labs    10/06/17 07/08/18  NA 139 143  K 4.7 4.3  BUN 20 20  CREATININE 0.6 0.6   Recent Labs    10/06/17 07/08/18  AST 23 20  ALT 22 18  ALKPHOS 125 91   Recent Labs    10/06/17 07/08/18  WBC 6.8 6.1  HGB 14.6 12.4  HCT 43 36  PLT 200 173   Recent Labs    07/08/18  CHOL 139  LDLCALC 80  TRIG 138   No results found for: [facility] Lab Results  Component Value Date   TSH 4.13 07/08/2018   Lab Results  Component Value Date   HGBA1C 5.6 04/10/2017   Lab Results  Component Value Date   CHOL 139 07/08/2018   HDL 35 07/08/2018   LDLCALC 80 07/08/2018   LDLDIRECT 190.1 05/06/2014   TRIG 138 07/08/2018   CHOLHDL 7 05/06/2014    Significant Diagnostic Results in last 30 days:  No results found.  Assessment and Plan  Right conjunctivitis- we will treat what we treated her with last time which is ofloxacin drops 4 times daily for 7 days    Merrilee Seashore, MD

## 2018-07-25 ENCOUNTER — Other Ambulatory Visit: Payer: Self-pay | Admitting: Internal Medicine

## 2018-07-25 MED ORDER — LORAZEPAM 0.5 MG PO TABS: 0.5000 mg | ORAL_TABLET | Freq: Three times a day (TID) | ORAL | 2 refills | Status: DC

## 2018-07-26 ENCOUNTER — Encounter: Payer: Self-pay | Admitting: Internal Medicine

## 2018-07-26 ENCOUNTER — Non-Acute Institutional Stay (SKILLED_NURSING_FACILITY): Payer: Medicare Other | Admitting: Internal Medicine

## 2018-07-26 DIAGNOSIS — I110 Hypertensive heart disease with heart failure: Secondary | ICD-10-CM | POA: Diagnosis not present

## 2018-07-26 DIAGNOSIS — I5032 Chronic diastolic (congestive) heart failure: Secondary | ICD-10-CM

## 2018-07-26 DIAGNOSIS — J42 Unspecified chronic bronchitis: Secondary | ICD-10-CM | POA: Diagnosis not present

## 2018-07-26 DIAGNOSIS — Z8673 Personal history of transient ischemic attack (TIA), and cerebral infarction without residual deficits: Secondary | ICD-10-CM

## 2018-07-26 NOTE — Progress Notes (Signed)
Location:  [facility] Room Number: 411P Place of Service:  SNF ([phone])  [name]. [name], MD  Patient Care Team: [name], MD as PCP - General (Internal Medicine)  Extended Emergency Contact Information Primary Emergency Contact: [name],[name] Address: [address]          [name] [address] [name] Home Phone: [phone] Relation: Daughter Secondary Emergency Contact: [name] [place] Home Phone: [phone] Relation: Daughter    Allergies: Sulfa antibiotics  Chief Complaint  Patient presents with  . Medical Management of Chronic Issues    Routine visit    HPI: Patient is 85 y.o. female who is being seen for routine issues of history of stroke, COPD, and hypertension.  Past Medical History:  Diagnosis Date  . Acute on chronic diastolic heart failure (HCC)   . Aneurysm (HCC)    of the eye  . Anxiety 11/30/2010  . Closed fracture of part of upper end of humerus 10/14/2014  . COPD exacerbation (HCC)   . CVA (cerebral infarction) 11/30/2010  . Decubitus ulcer of sacral region, stage 4 (HCC)   . 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 Depression 12/23/2015  . History of stroke 11/25/2013  . Hyperlipidemia   . Hypertension   . Hypertensive heart disease with CHF (congestive heart failure) (HCC) 11/30/2010  . Iron deficiency anemia 12/10/2014  . Leukocytosis 10/15/2014  . Osteoporosis   . Postmenopausal HRT (hormone replacement therapy) 11/30/2010  . Stroke (HCC) 10/2008  . 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 EYE SURGERY    . PARTIAL HYSTERECTOMY  1968    Allergies as of 07/26/2018      Reactions   Sulfa Antibiotics    Cant remember reaction      Medication List       Accurate as of July 26, 2018 11:59 PM. Always use your most recent med list.         amLODipine 10 MG tablet Commonly known as:  NORVASC TAKE 1 TABLET AT BEDTIME   aspirin 81 MG chewable tablet Chew 81 mg by mouth daily.   atenolol 50 MG tablet Commonly known as:  TENORMIN TAKE 1 TABLET TWICE A DAY   atorvastatin 40 MG tablet Commonly known as:  LIPITOR TAKE 1 TABLET DAILY   ezetimibe 10 MG tablet Commonly known as:  ZETIA Take 10 mg by mouth daily.   ferrous sulfate 325 (65 FE) MG tablet Take 325 mg by mouth daily with breakfast.   fexofenadine 180 MG tablet Commonly known as:  ALLEGRA Take 180 mg by mouth daily.   ipratropium-albuterol 0.5-2.5 (3) MG/3ML Soln Commonly known as:  DUONEB Take 3 mLs by nebulization every 6 (six) hours as needed. wheezing/SOB   LORazepam 0.5 MG tablet Commonly known as:  Ativan Take 1 tablet (0.5 mg total) by mouth every 8 (eight) hours.   memantine 10 MG tablet Commonly known as:  NAMENDA Take 10 mg by mouth 2 (two) times daily.   mirtazapine 30 MG tablet Commonly known as:  REMERON Take 15 mg by mouth at bedtime.   oxyCODONE 5 MG immediate release tablet Commonly known as:  Oxy IR/ROXICODONE Take 1 tablet (5 mg total) by mouth every 6 (six) hours as needed for severe pain.   polyethylene glycol packet Commonly known as:  MIRALAX / GLYCOLAX Take 17  g by mouth daily. To be hold for diarrhea   potassium chloride SA 20 MEQ tablet Commonly known as:  K-DUR,KLOR-CON Take 20 mEq by mouth daily.   sennosides-docusate sodium 8.6-50 MG tablet Commonly known as:  SENOKOT-S Take 2 tablets by mouth at bedtime.   Systane 0.4-0.3 % Soln Generic drug:  Polyethyl Glycol-Propyl Glycol Apply 2 drops to eye 2 (two) times daily.   Tab-A-Vite Tabs Take 1 tablet by mouth daily.   traMADol 50 MG tablet Commonly known as:  ULTRAM Take 1 tablet (50 mg total) by mouth every 8 (eight) hours. Give 1 tablet by mouth every 8 hours scheduled for pain       No orders of the defined types were placed in this encounter.    Immunization History  Administered Date(s) Administered  . Influenza,inj,Quad PF,6+ Mos 04/16/2013, 05/06/2014  . Influenza-Unspecified 02/03/2015, 02/08/2016, 03/15/2017  . PPD Test 10/14/2014    Social History   Tobacco Use  . Smoking status: Former Smoker    Packs/day: 0.50    Years: 50.00    Pack years: 25.00  . Smokeless tobacco: Never Used  Substance Use Topics  . Alcohol use: No    Alcohol/week: 0.0 standard drinks    Review of Systems  DATA OBTAINED: from patient, nurse GENERAL:  no fevers, fatigue, appetite changes SKIN: No itching, rash HEENT: No complaint RESPIRATORY: No cough, wheezing, SOB CARDIAC: No chest pain, palpitations, lower extremity edema  GI: No abdominal pain, No N/V/D or constipation, No heartburn or reflux  GU: No dysuria, frequency or urgency, or incontinence  MUSCULOSKELETAL: No unrelieved bone/joint pain NEUROLOGIC: No headache, dizziness  PSYCHIATRIC: No overt anxiety or sadness  Vitals:   07/26/18 1143  BP: 123/69  Pulse: 81  Resp: 18  Temp: (!) 96.8 F (36 C)   Body mass index is 26.33 kg/m. Physical Exam  GENERAL APPEARANCE: Alert, conversant, No acute distress  SKIN: No diaphoresis rash HEENT: Unremarkable RESPIRATORY: Breathing is even, unlabored. Lung sounds are clear   CARDIOVASCULAR: Heart RRR no murmurs, rubs or gallops. No peripheral edema  GASTROINTESTINAL: Abdomen is soft, non-tender, not distended w/ normal bowel sounds.  GENITOURINARY: Bladder non tender, not distended  MUSCULOSKELETAL: No abnormal joints or musculature NEUROLOGIC: Cranial nerves 2-12 grossly intact. Moves all extremities PSYCHIATRIC: Mood and affect dementia, no behavioral issues  Patient Active Problem List   Diagnosis Date Noted  . Failure to thrive in adult 05/28/2018  . Allergic rhinitis 03/31/2018  . Osteoporosis 03/18/2016  . Depression 12/23/2015  . Chronic diastolic heart failure (HCC) 09/05/2015  . Right leg pain 08/05/2015  .  COPD (chronic obstructive pulmonary disease) (HCC) 06/25/2015  . Loss of weight 01/21/2015  . Anemia, iron deficiency 01/05/2015  . Acute upper respiratory infection 01/05/2015  . Iron deficiency anemia 12/10/2014  . Stomach discomfort 11/30/2014  . Subacute confusional state 11/30/2014  . Acute respiratory failure with hypoxia (HCC) 11/11/2014  . AKI (acute kidney injury) (HCC) 11/11/2014  . Hypernatremia 11/11/2014  . Hypokalemia 11/09/2014  . COPD exacerbation (HCC)   . Acute on chronic diastolic heart failure (HCC)   . Hypoxia   . SOB (shortness of breath)   . Pressure ulcer stage II 11/02/2014  . Palliative care encounter 11/02/2014  . HCAP (healthcare-associated pneumonia) 11/01/2014  . Leukocytosis 10/15/2014  . Closed fracture of part of upper end of humerus 10/14/2014  . Poor balance 11/25/2013  . History of stroke 11/25/2013  . 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 Myalgia 09/05/2012  .  Disequilibrium 09/05/2012  . Hypertension, essential 09/05/2012  . 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 Mobility impaired 07/15/2012  . Physical exam, routine 12/07/2011  . Cerumen impaction 04/04/2011  . Trapezius muscle spasm 04/04/2011  . Hypertensive heart disease with CHF (congestive heart failure) (HCC) 11/30/2010  . Hyperlipidemia 11/30/2010  . CVA (cerebral infarction) 11/30/2010  . Postmenopausal HRT (hormone replacement therapy) 11/30/2010  . Anxiety 11/30/2010  . Ankle weakness 11/30/2010    CMP     Component Value Date/Time   NA 143 07/08/2018   K 4.3 07/08/2018   CL 102 10/13/2015 1505   CO2 22 10/13/2015 1505   GLUCOSE 118 (H) 10/13/2015 1505   BUN 20 07/08/2018   CREATININE 0.6 07/08/2018   CREATININE 0.79 10/13/2015 1505   CALCIUM 9.1 10/13/2015 1505   PROT 6.3 10/13/2015 1505   ALBUMIN 3.8 10/13/2015 1505   ALBUMIN 3.8 10/13/2015 1505   AST 20 07/08/2018   ALT 18 07/08/2018   ALKPHOS 91 07/08/2018   BILITOT 0.6 10/13/2015 1505   GFRNONAA  >60 11/05/2014 0225   GFRAA >60 11/05/2014 0225   Recent Labs    10/06/17 07/08/18  NA 139 143  K 4.7 4.3  BUN 20 20  CREATININE 0.6 0.6   Recent Labs    10/06/17 07/08/18  AST 23 20  ALT 22 18  ALKPHOS 125 91   Recent Labs    10/06/17 07/08/18  WBC 6.8 6.1  HGB 14.6 12.4  HCT 43 36  PLT 200 173   Recent Labs    07/08/18  CHOL 139  LDLCALC 80  TRIG 138   No results found for: [facility] Lab Results  Component Value Date   TSH 4.13 07/08/2018   Lab Results  Component Value Date   HGBA1C 5.6 04/10/2017   Lab Results  Component Value Date   CHOL 139 07/08/2018   HDL 35 07/08/2018   LDLCALC 80 07/08/2018   LDLDIRECT 190.1 05/06/2014   TRIG 138 07/08/2018   CHOLHDL 7 05/06/2014    Significant Diagnostic Results in last 30 days:  No results found.  Assessment and Plan  History of stroke Continues without problem; continue ASA 81 mg daily  COPD (chronic obstructive pulmonary disease) (HCC) No exacerbations; continue Allegra 180 mg daily and PRN albuterol  Hypertensive heart disease with CHF (congestive heart failure) (HCC) Appears controlled; continue Tenormin 50 mg twice daily and Norvasc 10 mg daily      Brinlyn Cena D. [name], MD

## 2018-08-03 ENCOUNTER — Encounter: Payer: Self-pay | Admitting: Internal Medicine

## 2018-08-03 NOTE — Assessment & Plan Note (Signed)
No exacerbations; continue Allegra 180 mg daily and PRN albuterol

## 2018-08-03 NOTE — Assessment & Plan Note (Signed)
Appears controlled; continue Tenormin 50 mg twice daily and Norvasc 10 mg daily

## 2018-08-03 NOTE — Assessment & Plan Note (Signed)
Continues without problem; continue ASA 81 mg daily

## 2018-08-27 ENCOUNTER — Non-Acute Institutional Stay (SKILLED_NURSING_FACILITY): Payer: Medicare Other | Admitting: Internal Medicine

## 2018-08-27 DIAGNOSIS — H1031 Unspecified acute conjunctivitis, right eye: Secondary | ICD-10-CM

## 2018-08-28 ENCOUNTER — Encounter: Payer: Self-pay | Admitting: Internal Medicine

## 2018-08-28 NOTE — Progress Notes (Signed)
:  [facility] Room Number: 411P Place of Service:  SNF ([phone])  Bessy Reaney D. Lyn HollingsheadAlexander, MD  Patient Care Team: Margit HanksAlexander, Azion Centrella D, MD as PCP - General (Internal Medicine)  Extended Emergency Contact Information Primary Emergency Contact: [name],[name] Address: [address]          [place] [id number] [place] Home Phone: [phone] Relation: Daughter Secondary Emergency Contact: [name],[name]  [place] Home Phone: [phone] Relation: Daughter     Allergies: Sulfa antibiotics  Chief Complaint  Patient presents with   Acute Visit    HPI: Patient is 85 y.o. female who nursing asked me to see because her right eye is injected noticed today.  Patient not had any rhinorrhea or cough, fever or any cold symptoms.  Patient does have a history of conjunctivitis prior.  Per patient right eye hurts just a little.  Past Medical History:  Diagnosis Date   Acute on chronic diastolic heart failure (HCC)    Aneurysm (HCC)    of the eye   Anxiety 11/30/2010   Closed fracture of part of upper end of humerus 10/14/2014   COPD exacerbation (HCC)    CVA (cerebral infarction) 11/30/2010   Decubitus ulcer of sacral region, stage 4 (HCC)    Dementia due to Parkinson's disease without behavioral disturbance (HCC) 12/31/2014   Depression 12/23/2015   History of stroke 11/25/2013   Hyperlipidemia    Hypertension    Hypertensive heart disease with CHF (congestive heart failure) (HCC) 11/30/2010   Iron deficiency anemia 12/10/2014   Leukocytosis 10/15/2014   Osteoporosis    Postmenopausal HRT (hormone replacement therapy) 11/30/2010   Stroke (HCC) 10/2008   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 Always use your most recent med list.        acetaminophen 325 MG tablet Commonly known as:  TYLENOL Take 650 mg by mouth every 6 (six) hours as needed.   amLODipine 10 MG tablet Commonly known as:  NORVASC TAKE 1 TABLET AT BEDTIME   aspirin 81 MG chewable tablet Chew 81 mg by mouth daily.   atenolol 50 MG tablet Commonly known as:  TENORMIN TAKE 1 TABLET TWICE A DAY   atorvastatin 40 MG tablet Commonly known as:  LIPITOR TAKE 1 TABLET DAILY   bisacodyl 10 MG suppository Commonly known as:  DULCOLAX Place 10 mg rectally as needed for moderate constipation.   Ciloxan 0.3 % ophthalmic solution Generic drug:  ciprofloxacin Place 2 drops in eye four times daily x 7 days for conjunctivitis. (If left eye gets red, treat it as well)   ezetimibe 10 MG tablet Commonly known as:  ZETIA Take 10 mg by mouth daily.   ferrous sulfate 325 (65 FE) MG tablet Take 325 mg by mouth daily with breakfast.   fexofenadine 180 MG tablet Commonly known as:  ALLEGRA Take 180 mg by mouth daily.   ipratropium-albuterol 0.5-2.5 (3) MG/3ML Soln Commonly known as:  DUONEB Take 3 mLs by nebulization every 6 (six) hours as needed. wheezing/SOB   LORazepam 0.5 MG  tablet Commonly known as:  Ativan Take 1 tablet (0.5 mg total) by mouth every 8 (eight) hours.   magnesium hydroxide 400 MG/5ML suspension Commonly known as:  MILK OF MAGNESIA Take 30 mLs by mouth daily as needed for mild constipation.   memantine 10 MG tablet Commonly known as:  NAMENDA Take 10 mg by mouth 2 (two) times daily.   mirtazapine 30 MG tablet Commonly known as:  REMERON Take 15 mg by mouth at bedtime.   polyethylene glycol 17 g packet Commonly known as:  MIRALAX / GLYCOLAX Take 17 g by mouth daily. To be hold for diarrhea   potassium chloride SA 20 MEQ tablet Commonly known as:  K-DUR Take 20 mEq by mouth daily.   sennosides-docusate  sodium 8.6-50 MG tablet Commonly known as:  SENOKOT-S Take 2 tablets by mouth at bedtime.   Systane 0.4-0.3 % Soln Generic drug:  Polyethyl Glycol-Propyl Glycol Apply 2 drops to eye 2 (two) times daily.   Tab-A-Vite Tabs Take 1 tablet by mouth daily.   traMADol 50 MG tablet Commonly known as:  ULTRAM Take 1 tablet (50 mg total) by mouth every 8 (eight) hours. Give 1 tablet by mouth every 8 hours scheduled for pain       No orders of the defined types were placed in this encounter.   Immunization History  Administered Date(s) Administered   Influenza,inj,Quad PF,6+ Mos 04/16/2013, 05/06/2014   Influenza-Unspecified 02/03/2015, 02/08/2016, 03/15/2017   PPD Test 10/14/2014    Social History   Tobacco Use   Smoking status: Former Smoker    Packs/day: 0.50    Years: 50.00    Pack years: 25.00   Smokeless tobacco: Never Used  Substance Use Topics   Alcohol use: No    Alcohol/week: 0.0 standard drinks    Family history is   Family History  Problem Relation Age of Onset   Stroke Father    Heart failure Mother       Review of Systems  DATA OBTAINED: from patient, nurse GENERAL:  no fevers, fatigue, appetite changes SKIN: No itching, or rash EYES: Mild eye pain, +redness, discharge EARS: No earache, tinnitus, change in hearing NOSE: No congestion, drainage or bleeding  MOUTH/THROAT: No mouth or tooth pain, No sore throat RESPIRATORY: No cough, wheezing, SOB CARDIAC: No chest pain, palpitations, lower extremity edema  GI: No abdominal pain, No N/V/D or constipation, No heartburn or reflux  GU: No dysuria, frequency or urgency, or incontinence  MUSCULOSKELETAL: No unrelieved bone/joint pain NEUROLOGIC: No headache, dizziness or focal weakness PSYCHIATRIC: No c/o anxiety or sadness   Vitals:   08/28/18 1419  BP: 125/68  Pulse: (!) 58  Resp: 17  Temp: (!) 97 F (36.1 C)    SpO2 Readings from Last 1 Encounters:  11/30/17 (!) 85%   Body mass index  is 25.2 kg/m.     Physical Exam  GENERAL APPEARANCE: Alert, conversant,  No acute distress.  SKIN: No diaphoresis rash HEAD: Normocephalic, atraumatic  EYES: Conjunctiva/lids injected, small drainage medial canthus, no swelling to the lids. Pupils round, reactive. EOMs intact.  EARS: External exam WNL, canals clear. Hearing grossly normal.  NOSE: No deformity or discharge.  MOUTH/THROAT: Lips w/o lesions  RESPIRATORY: Breathing is even, unlabored. Lung sounds are clear   CARDIOVASCULAR: Heart RRR no murmurs, rubs or gallops. No peripheral edema.   GASTROINTESTINAL: Abdomen is soft, non-tender, not distended w/ normal bowel sounds. GENITOURINARY: Bladder non tender, not distended  MUSCULOSKELETAL: No abnormal joints or musculature NEUROLOGIC:  Cranial nerves 2-12 grossly intact. Moves all extremities  PSYCHIATRIC: Mood and affect dementia, no behavioral issues  Patient Active Problem List   Diagnosis Date Noted   Failure to thrive in adult 05/28/2018   Allergic rhinitis 03/31/2018   Osteoporosis 03/18/2016   Depression 12/23/2015   Chronic diastolic heart failure (HCC) 09/05/2015   Right leg pain 08/05/2015   COPD (chronic obstructive pulmonary disease) (HCC) 06/25/2015   Loss of weight 01/21/2015   Anemia, iron deficiency 01/05/2015   Acute upper respiratory infection 01/05/2015   Iron deficiency anemia 12/10/2014   Stomach discomfort 11/30/2014   Subacute confusional state 11/30/2014   Acute respiratory failure with hypoxia (HCC) 11/11/2014   AKI (acute kidney injury) (HCC) 11/11/2014   Hypernatremia 11/11/2014   Hypokalemia 11/09/2014   COPD exacerbation (HCC)    Acute on chronic diastolic heart failure (HCC)    Hypoxia    SOB (shortness of breath)    Pressure ulcer stage II 11/02/2014   Palliative care encounter 11/02/2014   HCAP (healthcare-associated pneumonia) 11/01/2014   Leukocytosis 10/15/2014   Closed fracture of part of upper end  of humerus 10/14/2014   Poor balance 11/25/2013   History of stroke 11/25/2013   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   Myalgia 09/05/2012   Disequilibrium 09/05/2012   Hypertension, essential 09/05/2012   Osteoporosis, post-menopausal 07/16/2012   Mobility impaired 07/15/2012   Physical exam, routine 12/07/2011   Cerumen impaction 04/04/2011   Trapezius muscle spasm 04/04/2011   Hypertensive heart disease with CHF (congestive heart failure) (HCC) 11/30/2010   Hyperlipidemia 11/30/2010   CVA (cerebral infarction) 11/30/2010   Postmenopausal HRT (hormone replacement therapy) 11/30/2010   Anxiety 11/30/2010   Ankle weakness 11/30/2010      Labs reviewed: Basic Metabolic Panel:    Component Value Date/Time   NA 143 07/08/2018   K 4.3 07/08/2018   CL 102 10/13/2015 1505   CO2 22 10/13/2015 1505   GLUCOSE 118 (H) 10/13/2015 1505   BUN 20 07/08/2018   CREATININE 0.6 07/08/2018   CREATININE 0.79 10/13/2015 1505   CALCIUM 9.1 10/13/2015 1505   PROT 6.3 10/13/2015 1505   ALBUMIN 3.8 10/13/2015 1505   ALBUMIN 3.8 10/13/2015 1505   AST 20 07/08/2018   ALT 18 07/08/2018   ALKPHOS 91 07/08/2018   BILITOT 0.6 10/13/2015 1505   GFRNONAA >60 11/05/2014 0225   GFRAA >60 11/05/2014 0225    Recent Labs    10/06/17 07/08/18  NA 139 143  K 4.7 4.3  BUN 20 20  CREATININE 0.6 0.6   Liver Function Tests: Recent Labs    10/06/17 07/08/18  AST 23 20  ALT 22 18  ALKPHOS 125 91   No results for input(s): LIPASE, AMYLASE in the last 8760 hours. No results for input(s): AMMONIA in the last 8760 hours. CBC: Recent Labs    10/06/17 07/08/18  WBC 6.8 6.1  HGB 14.6 12.4  HCT 43 36  PLT 200 173   Lipid Recent Labs    07/08/18  CHOL 139  HDL 35  LDLCALC 80  TRIG 138    Cardiac Enzymes: No results for input(s): CKTOTAL, CKMB, CKMBINDEX, TROPONINI in the last 8760 hours. BNP: No results for input(s): BNP in the last 8760 hours. No results found for:  [facility] Lab Results  Component Value Date   HGBA1C 5.6 04/10/2017   Lab Results  Component Value Date   TSH 4.13 07/08/2018   No results found for: VITAMINB12 No results found for: FOLATE No results  found for: IRON, TIBC, FERRITIN  Imaging and Procedures obtained prior to SNF admission: Dg Skull 1-3 Views  Result Date: 08/13/2015 CLINICAL DATA:  Pre MRI.  Look for mental. EXAM: SKULL - 1-3 VIEW COMPARISON:  None. FINDINGS: No radiopaque foreign body. Ok to proceed to MRI. Suspected pineal calcification incidentally noted. IMPRESSION: No radiopaque foreign body. Ok to proceed to MRI. Electronically Signed   By: Simonne Come M.D.   On: 08/13/2015 10:19   Mr Pelvis Wo Contrast  Result Date: 08/13/2015 CLINICAL DATA:  Sacral osteomyelitis. Decubitus ulcers. Generalized weakness. Nonambulatory. EXAM: MRI PELVIS WITHOUT CONTRAST TECHNIQUE: Multiplanar multisequence MR imaging of the pelvis was performed. No intravenous contrast was administered. COMPARISON:  None. FINDINGS: Despite efforts by the technologist and patient, motion artifact is present on today's exam and could not be eliminated. This reduces exam sensitivity and specificity. Disc bulge at L4-5. Indistinctness and potentially edema of the lower coccygeal segment, image 23/9, with edema in the subcutaneous tissues superficial to the lower sacrum and also presacral edema. There is some bandaging dorsally in this vicinity presumably due to wound weeping. There appears to be ulceration extending to and just below the lower coccyx towards the rectum, but I do not see a definite fistula. The rectum is distended with gas. There is low-level edema in the left gluteus medius muscle. Subtle edema along the hip adductor musculature bilaterally. Fifth Upper normal amount of joint fluid in both hips. IMPRESSION: 1. Skin ulceration superficial to and just below the lower coccygeal segment, with suspicion for early osteomyelitis of the lower coccygeal  segment given the indistinctness of marginal cortication on image 23/9 as well as the surrounding soft tissue edema. There is also presacral edema extending anterior to the sacrum and coccyx, without a drainable abscess. 2. Gaseous distention of the rectum. 3. Low-level nonspecific edema in the left gluteus medius muscle and in the hip adductor musculature. 4. Degenerative disc disease at L4-5. 5. Motion artifact reduces diagnostic sensitivity and specificity. Electronically Signed   By: Gaylyn Rong M.D.   On: 08/13/2015 12:30     Not all labs, radiology exams or other studies done during hospitalization come through on my EPIC note; however they are reviewed by me.    Assessment and Plan  Conjunctivitis right eye- started patient on ciprofloxacin ophthalmic 2 drops 4 times daily for 7 days; will monitor progress    Thurston Hole D. [name], MD

## 2018-08-31 ENCOUNTER — Encounter: Payer: Self-pay | Admitting: Internal Medicine

## 2018-09-02 ENCOUNTER — Non-Acute Institutional Stay (SKILLED_NURSING_FACILITY): Payer: Medicare Other | Admitting: Internal Medicine

## 2018-09-02 ENCOUNTER — Encounter: Payer: Self-pay | Admitting: Internal Medicine

## 2018-09-02 DIAGNOSIS — G214 Vascular parkinsonism: Secondary | ICD-10-CM | POA: Diagnosis not present

## 2018-09-02 DIAGNOSIS — F419 Anxiety disorder, unspecified: Secondary | ICD-10-CM

## 2018-09-02 DIAGNOSIS — J3089 Other allergic rhinitis: Secondary | ICD-10-CM

## 2018-09-02 NOTE — Progress Notes (Signed)
[facility] Room Number: [phone] Place of Service:  SNF [phone])  Margit HanksAlexander, Sabrena Gavitt D, MD  Patient Care Team: Margit HanksAlexander, Anie Juniel D, MD as PCP - General (Internal Medicine)  Extended Emergency Contact Information Primary Emergency Contact: [name],[name] Address: [address]          [place] [id number] [place] Home Phone: [phone] Relation: Daughter Secondary Emergency Contact: [name],[name]  [place] Home Phone: [phone] Relation: Daughter    Allergies: Sulfa antibiotics  Chief Complaint  Patient presents with  . Medical Management of Chronic Issues    Routine Visit    HPI: Patient is 85 y.o. female who is being seen for routine issues of allergic rhinitis, vascular parkinsonism, and anxiety.  Past Medical History:  Diagnosis Date  . Acute on chronic diastolic heart failure (HCC)   . Aneurysm (HCC)    of the eye  . Anxiety 11/30/2010  . Closed fracture of part of upper end of humerus 10/14/2014  . COPD exacerbation (HCC)   . CVA (cerebral infarction) 11/30/2010  . Decubitus ulcer of sacral region, stage 4 (HCC)   . 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 Depression 12/23/2015  . History of stroke 11/25/2013  . Hyperlipidemia   . Hypertension   . Hypertensive heart disease with CHF (congestive heart failure) (HCC) 11/30/2010  . Iron deficiency anemia 12/10/2014  . Leukocytosis 10/15/2014  . Osteoporosis   . Postmenopausal HRT (hormone replacement therapy) 11/30/2010  . Stroke (HCC) 10/2008  . 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 EYE SURGERY    . PARTIAL HYSTERECTOMY  1968    Allergies as of 09/02/2018      Reactions   Sulfa Antibiotics    Cant remember reaction      Medication List       Accurate as of September 02, 2018 11:59 PM. Always use your most recent med list.        acetaminophen 325 MG tablet Commonly known as:  TYLENOL Take 650 mg by mouth every 6 (six) hours as needed.   amLODipine 10 MG tablet Commonly known as:  NORVASC TAKE 1 TABLET AT BEDTIME   aspirin 81 MG chewable tablet Chew 81 mg by mouth daily.   atenolol 50 MG tablet Commonly known as:  TENORMIN TAKE 1 TABLET TWICE A DAY   atorvastatin 40 MG tablet Commonly known as:  LIPITOR TAKE 1 TABLET DAILY   bisacodyl 10 MG suppository Commonly known as:  DULCOLAX Place 10 mg rectally as needed for moderate constipation.   Ciloxan 0.3 % ophthalmic solution Generic drug:  ciprofloxacin Place 2 drops in eye four times daily x 7 days for conjunctivitis. (If left eye gets red, treat it as well)   ezetimibe 10 MG tablet Commonly known as:  ZETIA Take 10 mg by mouth daily.   ferrous sulfate 325 (65 FE) MG tablet Take 325 mg by mouth daily with breakfast.   fexofenadine 180 MG tablet Commonly known as:  ALLEGRA Take 180 mg by mouth daily.   ipratropium-albuterol 0.5-2.5 (3) MG/3ML Soln Commonly known as:  DUONEB Take 3 mLs by nebulization every 6 (six) hours as needed. wheezing/SOB   LORazepam 0.5 MG tablet Commonly known as:  Ativan Take 1 tablet (0.5 mg total) by mouth every 8 (eight) hours.   magnesium hydroxide 400 MG/5ML suspension Commonly known as:  MILK OF MAGNESIA Take 30 mLs by mouth daily as needed for mild constipation.   memantine 10 MG tablet Commonly known as:  NAMENDA Take 10 mg by mouth 2 (two) times daily.   mirtazapine 30 MG tablet Commonly known as:  REMERON Take 15 mg by mouth at bedtime.   polyethylene glycol 17 g packet Commonly known as:  MIRALAX / GLYCOLAX Take 17 g by mouth daily. To be hold for diarrhea   potassium chloride SA 20 MEQ tablet Commonly known as:  K-DUR Take 20 mEq by mouth daily.   sennosides-docusate sodium 8.6-50 MG tablet Commonly known as:  SENOKOT-S Take 2 tablets by mouth at bedtime.   Systane 0.4-0.3 % Soln  Generic drug:  Polyethyl Glycol-Propyl Glycol Apply 2 drops to eye 2 (two) times daily.   Tab-A-Vite Tabs Take 1 tablet by mouth daily.   traMADol 50 MG tablet Commonly known as:  ULTRAM Take 1 tablet (50 mg total) by mouth every 8 (eight) hours. Give 1 tablet by mouth every 8 hours scheduled for pain       No orders of the defined types were placed in this encounter.   Immunization History  Administered Date(s) Administered  . Influenza,inj,Quad PF,6+ Mos 04/16/2013, 05/06/2014  . Influenza-Unspecified 02/03/2015, 02/08/2016, 03/15/2017  . PPD Test 10/14/2014    Social History   Tobacco Use  . Smoking status: Former Smoker    Packs/day: 0.50    Years: 50.00    Pack years: 25.00  . Smokeless tobacco: Never Used  Substance Use Topics  . Alcohol use: No    Alcohol/week: 0.0 standard drinks    Review of Systems  DATA OBTAINED: from patient, nurse GENERAL:  no fevers, fatigue, appetite changes SKIN: No itching, rash HEENT: No complaint RESPIRATORY: No cough, wheezing, SOB CARDIAC: No chest pain, palpitations, lower extremity edema  GI: No abdominal pain, No N/V/D or constipation, No heartburn or reflux  GU: No dysuria, frequency or urgency, or incontinence  MUSCULOSKELETAL: No unrelieved bone/joint pain NEUROLOGIC: No headache, dizziness  PSYCHIATRIC: No overt anxiety or sadness  Vitals:   09/02/18 0912  BP: 128/76  Pulse: 67  Resp: 18  Temp: (!) 97.3 F (36.3 C)  SpO2: 92%   Body mass index is 25.2 kg/m. Physical Exam  GENERAL APPEARANCE: Alert, conversant, No acute distress  SKIN: No diaphoresis rash HEENT: Unremarkable RESPIRATORY: Breathing is even, unlabored. Lung sounds are clear   CARDIOVASCULAR: Heart RRR no murmurs, rubs or gallops. No peripheral edema  GASTROINTESTINAL: Abdomen is soft, non-tender, not distended w/ normal bowel sounds.  GENITOURINARY: Bladder non tender, not distended  MUSCULOSKELETAL: No abnormal joints or musculature  NEUROLOGIC: Cranial nerves 2-12 grossly intact. Moves all extremities PSYCHIATRIC: Mood and affect appropriate to situation, no behavioral issues  Patient Active Problem List   Diagnosis Date Noted  . Failure to thrive in adult 05/28/2018  . Allergic rhinitis 03/31/2018  . Osteoporosis 03/18/2016  . Depression 12/23/2015  . Chronic diastolic heart failure (HCC) 09/05/2015  . Right leg pain 08/05/2015  . COPD (chronic obstructive pulmonary disease) (HCC) 06/25/2015  . Loss of weight 01/21/2015  . Anemia, iron deficiency 01/05/2015  . Acute upper respiratory infection 01/05/2015  . Iron deficiency anemia 12/10/2014  . Stomach discomfort 11/30/2014  . Subacute confusional state 11/30/2014  . Acute respiratory failure with hypoxia (HCC) 11/11/2014  . AKI (acute kidney injury) (HCC) 11/11/2014  . Hypernatremia 11/11/2014  . Hypokalemia 11/09/2014  . COPD exacerbation (HCC)   . Acute on chronic diastolic  heart failure (HCC)   . Hypoxia   . SOB (shortness of breath)   . Pressure ulcer stage II 11/02/2014  . Palliative care encounter 11/02/2014  . HCAP (healthcare-associated pneumonia) 11/01/2014  . Leukocytosis 10/15/2014  . Closed fracture of part of upper end of humerus 10/14/2014  . Poor balance 11/25/2013  . History of stroke 11/25/2013  . 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 Myalgia 09/05/2012  . Disequilibrium 09/05/2012  . Hypertension, essential 09/05/2012  . 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 Mobility impaired 07/15/2012  . Physical exam, routine 12/07/2011  . Cerumen impaction 04/04/2011  . Trapezius muscle spasm 04/04/2011  . Hypertensive heart disease with CHF (congestive heart failure) (HCC) 11/30/2010  . Hyperlipidemia 11/30/2010  . CVA (cerebral infarction) 11/30/2010  . Postmenopausal HRT (hormone replacement therapy) 11/30/2010  . Anxiety 11/30/2010  . Ankle weakness 11/30/2010    CMP     Component Value Date/Time   NA 143 07/08/2018   K  4.3 07/08/2018   CL 102 10/13/2015 1505   CO2 22 10/13/2015 1505   GLUCOSE 118 (H) 10/13/2015 1505   BUN 20 07/08/2018   CREATININE 0.6 07/08/2018   CREATININE 0.79 10/13/2015 1505   CALCIUM 9.1 10/13/2015 1505   PROT 6.3 10/13/2015 1505   ALBUMIN 3.8 10/13/2015 1505   ALBUMIN 3.8 10/13/2015 1505   AST 20 07/08/2018   ALT 18 07/08/2018   ALKPHOS 91 07/08/2018   BILITOT 0.6 10/13/2015 1505   GFRNONAA >60 11/05/2014 0225   GFRAA >60 11/05/2014 0225   Recent Labs    10/06/17 07/08/18  NA 139 143  K 4.7 4.3  BUN 20 20  CREATININE 0.6 0.6   Recent Labs    10/06/17 07/08/18  AST 23 20  ALT 22 18  ALKPHOS 125 91   Recent Labs    10/06/17 07/08/18  WBC 6.8 6.1  HGB 14.6 12.4  HCT 43 36  PLT 200 173   Recent Labs    07/08/18  CHOL 139  LDLCALC 80  TRIG 138   No results found for: [facility] Lab Results  Component Value Date   TSH 4.13 07/08/2018   Lab Results  Component Value Date   HGBA1C 5.6 04/10/2017   Lab Results  Component Value Date   CHOL 139 07/08/2018   HDL 35 07/08/2018   LDLCALC 80 07/08/2018   LDLDIRECT 190.1 05/06/2014   TRIG 138 07/08/2018   CHOLHDL 7 05/06/2014    Significant Diagnostic Results in last 30 days:  No results found.  Assessment and Plan  Allergic rhinitis Chronic and stable; continue Allegra 180 mg p.o. daily  Vascular parkinsonism Patient is having no difficulty with this or Parkinson's-like symptoms and is on no medications; continue to monitor  Anxiety Chronic and stable; continue Ativan 0.5 mg p.o. every 8     Saima Monterroso D. [name], MD

## 2018-09-03 ENCOUNTER — Encounter: Payer: Self-pay | Admitting: Internal Medicine

## 2018-09-03 NOTE — Assessment & Plan Note (Signed)
Patient is having no difficulty with this or Parkinson's-like symptoms and is on no medications; continue to monitor

## 2018-09-03 NOTE — Assessment & Plan Note (Signed)
Chronic and stable; continue Ativan 0.5 mg p.o. every 8

## 2018-09-03 NOTE — Assessment & Plan Note (Signed)
Chronic and stable; continue Allegra 180 mg p.o. daily

## 2018-09-17 ENCOUNTER — Other Ambulatory Visit: Payer: Self-pay | Admitting: Internal Medicine

## 2018-09-17 MED ORDER — TRAMADOL HCL 50 MG PO TABS: 50.0000 mg | ORAL_TABLET | Freq: Three times a day (TID) | ORAL | 0 refills | Status: DC

## 2018-09-25 ENCOUNTER — Other Ambulatory Visit: Payer: Self-pay | Admitting: Internal Medicine

## 2018-09-25 MED ORDER — LORAZEPAM 0.5 MG PO TABS: 0.5000 mg | ORAL_TABLET | Freq: Three times a day (TID) | ORAL | 2 refills | Status: DC

## 2018-10-02 ENCOUNTER — Non-Acute Institutional Stay (SKILLED_NURSING_FACILITY): Payer: Medicare Other | Admitting: Internal Medicine

## 2018-10-02 ENCOUNTER — Encounter: Payer: Self-pay | Admitting: Internal Medicine

## 2018-10-02 DIAGNOSIS — B309 Viral conjunctivitis, unspecified: Secondary | ICD-10-CM

## 2018-10-02 NOTE — Progress Notes (Deleted)
: Provider:   Location:  [facility] Room Number: 411 P Place of Service:  SNF ([id number])  PCP: [name], MD Patient Care Team: [name], MD as PCP - General (Internal Medicine)  Extended Emergency Contact Information Primary Emergency Contact: [name],[name] Address: [phone]          [name] [address] [place] Home Phone: [phone] Relation: Daughter Secondary Emergency Contact: [name] [place] Home Phone: [phone] Relation: Daughter     Allergies: Sulfa antibiotics  Chief Complaint  Patient presents with   Medical Management of Chronic Issues    Routine Visit of Medical Management   Best Practice Recommendations    Due for Pneumonia Vaccine    HPI: Patient is 85 y.o. 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) 12/31/2014   Depression 12/23/2015   History of stroke 11/25/2013   Hyperlipidemia    Hypertension    Hypertensive heart disease with CHF (congestive heart failure) (HCC) 11/30/2010   Iron deficiency anemia 12/10/2014   Leukocytosis 10/15/2014   Osteoporosis    Postmenopausal HRT (hormone replacement therapy) 11/30/2010   Stroke (HCC) 10/2008   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 If you have any questions,  ask your nurse or doctor.        STOP taking these medications   Ciloxan 0.3 % ophthalmic solution Generic drug:  ciprofloxacin Stopped by:  Merrilee Seashore, MD   ipratropium-albuterol 0.5-2.5 (3) MG/3ML Soln Commonly known as:  DUONEB Stopped by:  Merrilee Seashore, MD     TAKE these medications   acetaminophen 325 MG tablet Commonly known as:  TYLENOL Take 650 mg by mouth every 6 (six) hours as needed. What changed:  Another medication with the same name was removed. Continue taking this medication, and follow the directions you see here. Changed by:  Merrilee Seashore, MD   amLODipine 10 MG tablet Commonly known as:  NORVASC TAKE 1 TABLET AT BEDTIME   aspirin 81 MG chewable tablet Chew 81 mg by mouth daily.   atenolol 50 MG tablet Commonly known as:  TENORMIN TAKE 1 TABLET TWICE A DAY   atorvastatin 40 MG tablet Commonly known as:  LIPITOR TAKE 1 TABLET DAILY   bisacodyl 10 MG suppository Commonly known as:  DULCOLAX Place 10 mg rectally as needed for moderate constipation.   ergocalciferol 1.25 MG (50000 UT) capsule Commonly known as:  VITAMIN D2 Take 50,000 Units by mouth once a week.   ezetimibe 10 MG tablet Commonly known as:  ZETIA Take 10 mg by mouth daily.   ferrous sulfate 325 (65 FE) MG tablet Take 325 mg by  mouth daily with breakfast.   fexofenadine 180 MG tablet Commonly known as:  ALLEGRA Take 180 mg by mouth daily.   LORazepam 0.5 MG tablet Commonly known as:  Ativan Take 1 tablet (0.5 mg total) by mouth every 8 (eight) hours.   magnesium hydroxide 400 MG/5ML suspension Commonly known as:  MILK OF MAGNESIA Take 30 mLs by mouth daily as needed for mild constipation.   memantine 10 MG tablet Commonly known as:  NAMENDA Take 10 mg by mouth 2 (two) times daily.   mirtazapine 30 MG tablet Commonly known as:  REMERON Take 30 mg by mouth at bedtime. What changed:  Another medication with the same name was removed. Continue taking this  medication, and follow the directions you see here. Changed by:  Merrilee SeashoreAnne Alexander, MD   polyethylene glycol 17 g packet Commonly known as:  MIRALAX / GLYCOLAX Take 17 g by mouth daily. To be hold for diarrhea   potassium chloride SA 20 MEQ tablet Commonly known as:  K-DUR Take 20 mEq by mouth daily.   sennosides-docusate sodium 8.6-50 MG tablet Commonly known as:  SENOKOT-S Take 2 tablets by mouth at bedtime.   Systane 0.4-0.3 % Soln Generic drug:  Polyethyl Glycol-Propyl Glycol Apply 2 drops to eye 2 (two) times daily.   Tab-A-Vite Tabs Take 1 tablet by mouth daily.   traMADol 50 MG tablet Commonly known as:  ULTRAM Take 1 tablet (50 mg total) by mouth every 8 (eight) hours. Give 1 tablet by mouth every 8 hours scheduled for pain       No orders of the defined types were placed in this encounter.   Immunization History  Administered Date(s) Administered   Influenza,inj,Quad PF,6+ Mos 04/16/2013, 05/06/2014   Influenza-Unspecified 02/03/2015, 02/08/2016, 03/15/2017, 02/15/2018   PPD Test 10/14/2014   Pneumococcal Conjugate-13 01/05/2017    Social History   Tobacco Use   Smoking status: Former Smoker    Packs/day: 0.50    Years: 50.00    Pack years: 25.00   Smokeless tobacco: Never Used  Substance Use Topics   Alcohol use: No    Alcohol/week: 0.0 standard drinks    Family history is   Family History  Problem Relation Age of Onset   Stroke Father    Heart failure Mother       Review of Systems  DATA OBTAINED: from patient, nurse, medical record, family member GENERAL:  no fevers, fatigue, appetite changes SKIN: No itching, or rash EYES: No eye pain, redness, discharge EARS: No earache, tinnitus, change in hearing NOSE: No congestion, drainage or bleeding  MOUTH/THROAT: No mouth or tooth pain, No sore throat RESPIRATORY: No cough, wheezing, SOB CARDIAC: No chest pain, palpitations, lower extremity edema  GI: No abdominal pain, No N/V/D or  constipation, No heartburn or reflux  GU: No dysuria, frequency or urgency, or incontinence  MUSCULOSKELETAL: No unrelieved bone/joint pain NEUROLOGIC: No headache, dizziness or focal weakness PSYCHIATRIC: No c/o anxiety or sadness   Vitals:   10/02/18 0910  BP: 131/64  Pulse: 60  Resp: 19  Temp: (!) 97.1 F (36.2 C)    SpO2 Readings from Last 1 Encounters:  09/02/18 92%   Body mass index is 24.34 kg/m.     Physical Exam  GENERAL APPEARANCE: Alert, conversant,  No acute distress.  SKIN: No diaphoresis rash HEAD: Normocephalic, atraumatic  EYES: Conjunctiva/lids clear. Pupils round, reactive. EOMs intact.  EARS: External exam WNL, canals clear. Hearing grossly normal.  NOSE: No deformity or discharge.  MOUTH/THROAT:  Lips w/o lesions  RESPIRATORY: Breathing is even, unlabored. Lung sounds are clear   CARDIOVASCULAR: Heart RRR no murmurs, rubs or gallops. No peripheral edema.   GASTROINTESTINAL: Abdomen is soft, non-tender, not distended w/ normal bowel sounds. GENITOURINARY: Bladder non tender, not distended  MUSCULOSKELETAL: No abnormal joints or musculature NEUROLOGIC:  Cranial nerves 2-12 grossly intact. Moves all extremities  PSYCHIATRIC: Mood and affect appropriate to situation, no behavioral issues  Patient Active Problem List   Diagnosis Date Noted   Failure to thrive in adult 05/28/2018   Allergic rhinitis 03/31/2018   Osteoporosis 03/18/2016   Depression 12/23/2015   Chronic diastolic heart failure (HCC) 09/05/2015   Right leg pain 08/05/2015   COPD (chronic obstructive pulmonary disease) (HCC) 06/25/2015   Loss of weight 01/21/2015   Anemia, iron deficiency 01/05/2015   Acute upper respiratory infection 01/05/2015   Iron deficiency anemia 12/10/2014   Stomach discomfort 11/30/2014   Subacute confusional state 11/30/2014   Acute respiratory failure with hypoxia (HCC) 11/11/2014   AKI (acute kidney injury) (HCC) 11/11/2014   Hypernatremia  11/11/2014   Hypokalemia 11/09/2014   COPD exacerbation (HCC)    Acute on chronic diastolic heart failure (HCC)    Hypoxia    SOB (shortness of breath)    Pressure ulcer stage II 11/02/2014   Palliative care encounter 11/02/2014   HCAP (healthcare-associated pneumonia) 11/01/2014   Leukocytosis 10/15/2014   Closed fracture of part of upper end of humerus 10/14/2014   Poor balance 11/25/2013   History of stroke 11/25/2013   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   Myalgia 09/05/2012   Disequilibrium 09/05/2012   Hypertension, essential 09/05/2012   Osteoporosis, post-menopausal 07/16/2012   Mobility impaired 07/15/2012   Physical exam, routine 12/07/2011   Cerumen impaction 04/04/2011   Trapezius muscle spasm 04/04/2011   Hypertensive heart disease with CHF (congestive heart failure) (HCC) 11/30/2010   Hyperlipidemia 11/30/2010   CVA (cerebral infarction) 11/30/2010   Postmenopausal HRT (hormone replacement therapy) 11/30/2010   Anxiety 11/30/2010   Ankle weakness 11/30/2010      Labs reviewed: Basic Metabolic Panel:    Component Value Date/Time   NA 143 07/08/2018   K 4.3 07/08/2018   CL 102 10/13/2015 1505   CO2 22 10/13/2015 1505   GLUCOSE 118 (H) 10/13/2015 1505   BUN 20 07/08/2018   CREATININE 0.6 07/08/2018   CREATININE 0.79 10/13/2015 1505   CALCIUM 9.1 10/13/2015 1505   PROT 6.3 10/13/2015 1505   ALBUMIN 3.8 10/13/2015 1505   ALBUMIN 3.8 10/13/2015 1505   AST 20 07/08/2018   ALT 18 07/08/2018   ALKPHOS 91 07/08/2018   BILITOT 0.6 10/13/2015 1505   GFRNONAA >60 11/05/2014 0225   GFRAA >60 11/05/2014 0225    Recent Labs    10/06/17 07/08/18  NA 139 143  K 4.7 4.3  BUN 20 20  CREATININE 0.6 0.6   Liver Function Tests: Recent Labs    10/06/17 07/08/18  AST 23 20  ALT 22 18  ALKPHOS 125 91   No results for input(s): LIPASE, AMYLASE in the last 8760 hours. No results for input(s): AMMONIA in the last 8760  hours. CBC: Recent Labs    10/06/17 07/08/18  WBC 6.8 6.1  HGB 14.6 12.4  HCT 43 36  PLT 200 173   Lipid Recent Labs    07/08/18  CHOL 139  HDL 35  LDLCALC 80  TRIG 138    Cardiac Enzymes: No results for input(s): CKTOTAL, CKMB, CKMBINDEX, TROPONINI in the last  8760 hours. BNP: No results for input(s): BNP in the last 8760 hours. No results found for: [facility] Lab Results  Component Value Date   HGBA1C 5.6 04/10/2017   Lab Results  Component Value Date   TSH 4.13 07/08/2018   No results found for: VITAMINB12 No results found for: FOLATE No results found for: IRON, TIBC, FERRITIN  Imaging and Procedures obtained prior to SNF admission: Dg Skull 1-3 Views  Result Date: 08/13/2015 CLINICAL DATA:  Pre MRI.  Look for mental. EXAM: SKULL - 1-3 VIEW COMPARISON:  None. FINDINGS: No radiopaque foreign body. Ok to proceed to MRI. Suspected pineal calcification incidentally noted. IMPRESSION: No radiopaque foreign body. Ok to proceed to MRI. Electronically Signed   By: Simonne Come M.D.   On: 08/13/2015 10:19   Mr Pelvis Wo Contrast  Result Date: 08/13/2015 CLINICAL DATA:  Sacral osteomyelitis. Decubitus ulcers. Generalized weakness. Nonambulatory. EXAM: MRI PELVIS WITHOUT CONTRAST TECHNIQUE: Multiplanar multisequence MR imaging of the pelvis was performed. No intravenous contrast was administered. COMPARISON:  None. FINDINGS: Despite efforts by the technologist and patient, motion artifact is present on today's exam and could not be eliminated. This reduces exam sensitivity and specificity. Disc bulge at L4-5. Indistinctness and potentially edema of the lower coccygeal segment, image 23/9, with edema in the subcutaneous tissues superficial to the lower sacrum and also presacral edema. There is some bandaging dorsally in this vicinity presumably due to wound weeping. There appears to be ulceration extending to and just below the lower coccyx towards the rectum, but I do not see a  definite fistula. The rectum is distended with gas. There is low-level edema in the left gluteus medius muscle. Subtle edema along the hip adductor musculature bilaterally. Fifth Upper normal amount of joint fluid in both hips. IMPRESSION: 1. Skin ulceration superficial to and just below the lower coccygeal segment, with suspicion for early osteomyelitis of the lower coccygeal segment given the indistinctness of marginal cortication on image 23/9 as well as the surrounding soft tissue edema. There is also presacral edema extending anterior to the sacrum and coccyx, without a drainable abscess. 2. Gaseous distention of the rectum. 3. Low-level nonspecific edema in the left gluteus medius muscle and in the hip adductor musculature. 4. Degenerative disc disease at L4-5. 5. Motion artifact reduces diagnostic sensitivity and specificity. Electronically Signed   By: Gaylyn Rong M.D.   On: 08/13/2015 12:30     Not all labs, radiology exams or other studies done during hospitalization come through on my EPIC note; however they are reviewed by me.    Assessment and Plan  No problem-specific Assessment & Plan notes found for this encounter.   [name]. [name], MD

## 2018-10-02 NOTE — Progress Notes (Signed)
Location:  [facility] Room Number: 411 P Place of Service:  SNF ([phone])  Margit HanksAlexander, Sherlyn Ebbert D, MD  Patient Care Team: Margit HanksAlexander, Chayanne Speir D, MD as PCP - General (Internal Medicine)  Extended Emergency Contact Information Primary Emergency Contact: [name],[name] Address: [address]          [place] [id number] [place] Home Phone: [phone] Relation: Daughter Secondary Emergency Contact: [name],[name]  [place] Home Phone: [phone] Relation: Daughter    Allergies: Sulfa antibiotics  Chief Complaint  Patient presents with  . Medical Management of Chronic Issues    Routine Visit of Medical Management    HPI: Patient is 85 y.o. female who nurses asked me to see for red eye.  Patient last had a red eye about a month ago.  This is a recurrent problem.  Patient admits to itching burning and discharge.  Past Medical History:  Diagnosis Date  . Acute on chronic diastolic heart failure (HCC)   . Aneurysm (HCC)    of the eye  . Anxiety 11/30/2010  . Closed fracture of part of upper end of humerus 10/14/2014  . COPD exacerbation (HCC)   . CVA (cerebral infarction) 11/30/2010  . Decubitus ulcer of sacral region, stage 4 (HCC)   . 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 Depression 12/23/2015  . History of stroke 11/25/2013  . Hyperlipidemia   . Hypertension   . Hypertensive heart disease with CHF (congestive heart failure) (HCC) 11/30/2010  . Iron deficiency anemia 12/10/2014  . Leukocytosis 10/15/2014  . Osteoporosis   . Postmenopausal HRT (hormone replacement therapy) 11/30/2010  . Stroke (HCC) 10/2008  . 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 EYE SURGERY    . PARTIAL HYSTERECTOMY  1968    Allergies as of 10/02/2018      Reactions   Sulfa Antibiotics    Cant remember reaction       Medication List       Accurate as of Oct 02, 2018 11:59 PM. If you have any questions, ask your nurse or doctor.        STOP taking these medications   Ciloxan 0.3 % ophthalmic solution Generic drug:  ciprofloxacin Stopped by:  Merrilee SeashoreAnne Andreka Stucki, MD   ipratropium-albuterol 0.5-2.5 (3) MG/3ML Soln Commonly known as:  DUONEB Stopped by:  Merrilee SeashoreAnne Nihal Doan, MD     TAKE these medications   acetaminophen 325 MG tablet Commonly known as:  TYLENOL Take 650 mg by mouth every 6 (six) hours as needed. What changed:  Another medication with the same name was removed. Continue taking this medication, and follow the directions you see here. Changed by:  Merrilee SeashoreAnne Aryiah Monterosso, MD   amLODipine 10 MG tablet Commonly known as:  NORVASC TAKE 1 TABLET AT BEDTIME   aspirin 81 MG chewable tablet Chew 81 mg by mouth daily.   atenolol 50 MG tablet Commonly known as:  TENORMIN TAKE 1 TABLET TWICE A DAY   atorvastatin 40 MG tablet Commonly known as:  LIPITOR TAKE 1 TABLET DAILY   bisacodyl 10 MG suppository Commonly known as:  DULCOLAX Place 10 mg rectally as needed for moderate constipation.   ergocalciferol 1.25 MG (50000 UT) capsule Commonly known as:  VITAMIN D2 Take 50,000 Units by mouth once a week.   ezetimibe 10 MG tablet Commonly known as:  ZETIA Take 10 mg by  mouth daily.   ferrous sulfate 325 (65 FE) MG tablet Take 325 mg by mouth daily with breakfast.   fexofenadine 180 MG tablet Commonly known as:  ALLEGRA Take 180 mg by mouth daily.   LORazepam 0.5 MG tablet Commonly known as:  Ativan Take 1 tablet (0.5 mg total) by mouth every 8 (eight) hours.   magnesium hydroxide 400 MG/5ML suspension Commonly known as:  MILK OF MAGNESIA Take 30 mLs by mouth daily as needed for mild constipation.   memantine 10 MG tablet Commonly known as:  NAMENDA Take 10 mg by mouth 2 (two) times daily.   mirtazapine 30 MG tablet Commonly known as:  REMERON Take 30 mg by mouth at bedtime. What  changed:  Another medication with the same name was removed. Continue taking this medication, and follow the directions you see here. Changed by:  Merrilee Seashore, MD   polyethylene glycol 17 g packet Commonly known as:  MIRALAX / GLYCOLAX Take 17 g by mouth daily. To be hold for diarrhea   potassium chloride SA 20 MEQ tablet Commonly known as:  K-DUR Take 20 mEq by mouth daily.   sennosides-docusate sodium 8.6-50 MG tablet Commonly known as:  SENOKOT-S Take 2 tablets by mouth at bedtime.   Systane 0.4-0.3 % Soln Generic drug:  Polyethyl Glycol-Propyl Glycol Apply 2 drops to eye 2 (two) times daily.   Tab-A-Vite Tabs Take 1 tablet by mouth daily.   traMADol 50 MG tablet Commonly known as:  ULTRAM Take 1 tablet (50 mg total) by mouth every 8 (eight) hours. Give 1 tablet by mouth every 8 hours scheduled for pain       No orders of the defined types were placed in this encounter.   Immunization History  Administered Date(s) Administered  . Influenza,inj,Quad PF,6+ Mos 04/16/2013, 05/06/2014  . Influenza-Unspecified 02/03/2015, 02/08/2016, 03/15/2017, 02/15/2018  . PPD Test 10/14/2014  . Pneumococcal Conjugate-13 01/05/2017    Social History   Tobacco Use  . Smoking status: Former Smoker    Packs/day: 0.50    Years: 50.00    Pack years: 25.00  . Smokeless tobacco: Never Used  Substance Use Topics  . Alcohol use: No    Alcohol/week: 0.0 standard drinks    Review of Systems  DATA OBTAINED: from patient, nurse GENERAL:  no fevers, fatigue, appetite changes SKIN: No itching, rash HEENT: Right eye with itching and burning RESPIRATORY: No cough, wheezing, SOB CARDIAC: No chest pain, palpitations, lower extremity edema  GI: No abdominal pain, No N/V/D or constipation, No heartburn or reflux  GU: No dysuria, frequency or urgency, or incontinence  MUSCULOSKELETAL: No unrelieved bone/joint pain NEUROLOGIC: No headache, dizziness  PSYCHIATRIC: No overt anxiety or  sadness  Vitals:   10/02/18 0910  BP: 131/64  Pulse: 60  Resp: 19  Temp: (!) 97.1 F (36.2 C)   Body mass index is 24.34 kg/m. Physical Exam  GENERAL APPEARANCE: Alert, conversant, No acute distress  SKIN: Conjunctiva red on right side, minimal swelling, minimal discharge HEENT: Unremarkable RESPIRATORY: Breathing is even, unlabored. Lung sounds are clear   CARDIOVASCULAR: Heart RRR no murmurs, rubs or gallops. No peripheral edema  GASTROINTESTINAL: Abdomen is soft, non-tender, not distended w/ normal bowel sounds.  GENITOURINARY: Bladder non tender, not distended  MUSCULOSKELETAL: No abnormal joints or musculature NEUROLOGIC: Cranial nerves 2-12 grossly intact. Moves all extremities PSYCHIATRIC: Mood and affect appropriate to situation, no behavioral issues  Patient Active Problem List   Diagnosis Date Noted  . Failure to thrive in  adult 05/28/2018  . Allergic rhinitis 03/31/2018  . Osteoporosis 03/18/2016  . Depression 12/23/2015  . Chronic diastolic heart failure (HCC) 09/05/2015  . Right leg pain 08/05/2015  . COPD (chronic obstructive pulmonary disease) (HCC) 06/25/2015  . Loss of weight 01/21/2015  . Anemia, iron deficiency 01/05/2015  . Acute upper respiratory infection 01/05/2015  . Iron deficiency anemia 12/10/2014  . Stomach discomfort 11/30/2014  . Subacute confusional state 11/30/2014  . Acute respiratory failure with hypoxia (HCC) 11/11/2014  . AKI (acute kidney injury) (HCC) 11/11/2014  . Hypernatremia 11/11/2014  . Hypokalemia 11/09/2014  . COPD exacerbation (HCC)   . Acute on chronic diastolic heart failure (HCC)   . Hypoxia   . SOB (shortness of breath)   . Pressure ulcer stage II 11/02/2014  . Palliative care encounter 11/02/2014  . HCAP (healthcare-associated pneumonia) 11/01/2014  . Leukocytosis 10/15/2014  . Closed fracture of part of upper end of humerus 10/14/2014  . Poor balance 11/25/2013  . History of stroke 11/25/2013  . Vascular  parkinsonism (HCC) 11/25/2013  . Myalgia 09/05/2012  . Disequilibrium 09/05/2012  . Hypertension, essential 09/05/2012  . 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 Mobility impaired 07/15/2012  . Physical exam, routine 12/07/2011  . Cerumen impaction 04/04/2011  . Trapezius muscle spasm 04/04/2011  . Hypertensive heart disease with CHF (congestive heart failure) (HCC) 11/30/2010  . Hyperlipidemia 11/30/2010  . CVA (cerebral infarction) 11/30/2010  . Postmenopausal HRT (hormone replacement therapy) 11/30/2010  . Anxiety 11/30/2010  . Ankle weakness 11/30/2010    CMP     Component Value Date/Time   NA 143 07/08/2018   K 4.3 07/08/2018   CL 102 10/13/2015 1505   CO2 22 10/13/2015 1505   GLUCOSE 118 (H) 10/13/2015 1505   BUN 20 07/08/2018   CREATININE 0.6 07/08/2018   CREATININE 0.79 10/13/2015 1505   CALCIUM 9.1 10/13/2015 1505   PROT 6.3 10/13/2015 1505   ALBUMIN 3.8 10/13/2015 1505   ALBUMIN 3.8 10/13/2015 1505   AST 20 07/08/2018   ALT 18 07/08/2018   ALKPHOS 91 07/08/2018   BILITOT 0.6 10/13/2015 1505   GFRNONAA >60 11/05/2014 0225   GFRAA >60 11/05/2014 0225   Recent Labs    07/08/18  NA 143  K 4.3  BUN 20  CREATININE 0.6   Recent Labs    07/08/18  AST 20  ALT 18  ALKPHOS 91   Recent Labs    07/08/18  WBC 6.1  HGB 12.4  HCT 36  PLT 173   Recent Labs    07/08/18  CHOL 139  LDLCALC 80  TRIG 138   No results found for: [facility] Lab Results  Component Value Date   TSH 4.13 07/08/2018   Lab Results  Component Value Date   HGBA1C 5.6 04/10/2017   Lab Results  Component Value Date   CHOL 139 07/08/2018   HDL 35 07/08/2018   LDLCALC 80 07/08/2018   LDLDIRECT 190.1 05/06/2014   TRIG 138 07/08/2018   CHOLHDL 7 05/06/2014    Significant Diagnostic Results in last 30 days:  No results found.  Assessment and Plan  Right conjunctivitis- ciprofloxacin eyedrops 4 times daily for 7 days: Referral to ophthalmologist for recurrent  conjunctivitis    [name]. [name], MD

## 2018-10-03 ENCOUNTER — Non-Acute Institutional Stay (SKILLED_NURSING_FACILITY): Payer: Medicare Other | Admitting: Internal Medicine

## 2018-10-03 DIAGNOSIS — E782 Mixed hyperlipidemia: Secondary | ICD-10-CM | POA: Diagnosis not present

## 2018-10-03 DIAGNOSIS — I5032 Chronic diastolic (congestive) heart failure: Secondary | ICD-10-CM | POA: Diagnosis not present

## 2018-10-03 DIAGNOSIS — M818 Other osteoporosis without current pathological fracture: Secondary | ICD-10-CM | POA: Diagnosis not present

## 2018-10-06 ENCOUNTER — Encounter: Payer: Self-pay | Admitting: Internal Medicine

## 2018-10-06 NOTE — Assessment & Plan Note (Signed)
No reported exacerbations; continue atenolol twice daily and ASA 81 mg daily; patient is not on a diuretic but weights are followed

## 2018-10-06 NOTE — Assessment & Plan Note (Signed)
Controlled; continue Lipitor 40 mg daily and Zetia 10 mg daily

## 2018-10-06 NOTE — Progress Notes (Signed)
Location:  [facility]   Place of Service:  SNF (31)SNF  Margit HanksAlexander, Anne D, MD  Patient Care Team: Margit HanksAlexander, Anne D, MD as PCP - General (Internal Medicine)  Extended Emergency Contact Information Primary Emergency Contact: [name],[name] Address: [address]          [place] [id number] [place] Home Phone: [phone] Relation: Daughter Secondary Emergency Contact: [name],[name]  [place] Home Phone: [phone] Relation: Daughter    Allergies: Sulfa antibiotics  Chief Complaint  Patient presents with  . Medical Management of Chronic Issues    HPI: Patient is 85 y.o. female who is being seen for routine issues of chronic diastolic congestive heart failure, osteoporosis, and hyperlipidemia.  Past Medical History:  Diagnosis Date  . Acute on chronic diastolic heart failure (HCC)   . Aneurysm (HCC)    of the eye  . Anxiety 11/30/2010  . Closed fracture of part of upper end of humerus 10/14/2014  . COPD exacerbation (HCC)   . CVA (cerebral infarction) 11/30/2010  . Decubitus ulcer of sacral region, stage 4 (HCC)   . 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 Depression 12/23/2015  . History of stroke 11/25/2013  . Hyperlipidemia   . Hypertension   . Hypertensive heart disease with CHF (congestive heart failure) (HCC) 11/30/2010  . Iron deficiency anemia 12/10/2014  . Leukocytosis 10/15/2014  . Osteoporosis   . Postmenopausal HRT (hormone replacement therapy) 11/30/2010  . Stroke (HCC) 10/2008  . 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 EYE SURGERY    . PARTIAL HYSTERECTOMY  1968    Allergies as of 10/03/2018      Reactions   Sulfa Antibiotics    Cant remember reaction      Medication List       Accurate as of Oct 03, 2018 11:59 PM. If you have any questions, ask your nurse or doctor.         acetaminophen 325 MG tablet Commonly known as:  TYLENOL Take 650 mg by mouth every 6 (six) hours as needed.   amLODipine 10 MG tablet Commonly known as:  NORVASC TAKE 1 TABLET AT BEDTIME   aspirin 81 MG chewable tablet Chew 81 mg by mouth daily.   atenolol 50 MG tablet Commonly known as:  TENORMIN TAKE 1 TABLET TWICE A DAY   atorvastatin 40 MG tablet Commonly known as:  LIPITOR TAKE 1 TABLET DAILY   bisacodyl 10 MG suppository Commonly known as:  DULCOLAX Place 10 mg rectally as needed for moderate constipation.   ergocalciferol 1.25 MG (50000 UT) capsule Commonly known as:  VITAMIN D2 Take 50,000 Units by mouth once a week.   ezetimibe 10 MG tablet Commonly known as:  ZETIA Take 10 mg by mouth daily.   ferrous sulfate 325 (65 FE) MG tablet Take 325 mg by mouth daily with breakfast.   fexofenadine 180 MG tablet Commonly known as:  ALLEGRA Take 180 mg by mouth daily.   LORazepam 0.5 MG tablet Commonly known as:  Ativan Take 1 tablet (0.5 mg total) by mouth every 8 (eight) hours.   magnesium hydroxide 400 MG/5ML suspension Commonly known as:  MILK OF MAGNESIA Take 30 mLs by mouth daily as needed for mild constipation.   memantine 10 MG tablet Commonly known as:  NAMENDA Take 10 mg by mouth 2 (two) times daily.  mirtazapine 30 MG tablet Commonly known as:  REMERON Take 30 mg by mouth at bedtime.   polyethylene glycol 17 g packet Commonly known as:  MIRALAX / GLYCOLAX Take 17 g by mouth daily. To be hold for diarrhea   potassium chloride SA 20 MEQ tablet Commonly known as:  K-DUR Take 20 mEq by mouth daily.   sennosides-docusate sodium 8.6-50 MG tablet Commonly known as:  SENOKOT-S Take 2 tablets by mouth at bedtime.   Systane 0.4-0.3 % Soln Generic drug:  Polyethyl Glycol-Propyl Glycol Apply 2 drops to eye 2 (two) times daily.   Tab-A-Vite Tabs Take 1 tablet by mouth daily.   traMADol 50 MG tablet Commonly known as:  ULTRAM Take 1  tablet (50 mg total) by mouth every 8 (eight) hours. Give 1 tablet by mouth every 8 hours scheduled for pain       No orders of the defined types were placed in this encounter.   Immunization History  Administered Date(s) Administered  . Influenza,inj,Quad PF,6+ Mos 04/16/2013, 05/06/2014  . Influenza-Unspecified 02/03/2015, 02/08/2016, 03/15/2017, 02/15/2018  . PPD Test 10/14/2014  . Pneumococcal Conjugate-13 01/05/2017    Social History   Tobacco Use  . Smoking status: Former Smoker    Packs/day: 0.50    Years: 50.00    Pack years: 25.00  . Smokeless tobacco: Never Used  Substance Use Topics  . Alcohol use: No    Alcohol/week: 0.0 standard drinks    Review of Systems  DATA OBTAINED: from patient, nurse GENERAL:  no fevers, fatigue, appetite changes SKIN: No itching, rash HEENT: No complaint RESPIRATORY: No cough, wheezing, SOB CARDIAC: No chest pain, palpitations, lower extremity edema  GI: No abdominal pain, No N/V/D or constipation, No heartburn or reflux  GU: No dysuria, frequency or urgency, or incontinence  MUSCULOSKELETAL: No unrelieved bone/joint pain NEUROLOGIC: No headache, dizziness  PSYCHIATRIC: No overt anxiety or sadness  Vitals:   10/06/18 1815  BP: 134/65  Pulse: 65  Resp: 18  Temp: 98.4 F (36.9 C)   Body mass index is 24.34 kg/m. Physical Exam  GENERAL APPEARANCE: Alert, conversant, No acute distress  SKIN: No diaphoresis rash HEENT: Unremarkable; right eye is much less red RESPIRATORY: Breathing is even, unlabored. Lung sounds are clear   CARDIOVASCULAR: Heart RRR no murmurs, rubs or gallops. No peripheral edema  GASTROINTESTINAL: Abdomen is soft, non-tender, not distended w/ normal bowel sounds.  GENITOURINARY: Bladder non tender, not distended  MUSCULOSKELETAL: No abnormal joints or musculature NEUROLOGIC: Cranial nerves 2-12 grossly intact. Moves all extremities PSYCHIATRIC: Mood and affect appropriate to situation, no behavioral  issues  Patient Active Problem List   Diagnosis Date Noted  . Failure to thrive in adult 05/28/2018  . Allergic rhinitis 03/31/2018  . Osteoporosis 03/18/2016  . Depression 12/23/2015  . Chronic diastolic heart failure (HCC) 09/05/2015  . Right leg pain 08/05/2015  . COPD (chronic obstructive pulmonary disease) (HCC) 06/25/2015  . Loss of weight 01/21/2015  . Anemia, iron deficiency 01/05/2015  . Acute upper respiratory infection 01/05/2015  . Iron deficiency anemia 12/10/2014  . Stomach discomfort 11/30/2014  . Subacute confusional state 11/30/2014  . Acute respiratory failure with hypoxia (HCC) 11/11/2014  . AKI (acute kidney injury) (HCC) 11/11/2014  . Hypernatremia 11/11/2014  . Hypokalemia 11/09/2014  . COPD exacerbation (HCC)   . Acute on chronic diastolic heart failure (HCC)   . Hypoxia   . SOB (shortness of breath)   . Pressure ulcer stage II 11/02/2014  . Palliative care encounter 11/02/2014  .  HCAP (healthcare-associated pneumonia) 11/01/2014  . Leukocytosis 10/15/2014  . Closed fracture of part of upper end of humerus 10/14/2014  . Poor balance 11/25/2013  . History of stroke 11/25/2013  . 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 Myalgia 09/05/2012  . Disequilibrium 09/05/2012  . Hypertension, essential 09/05/2012  . 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 Mobility impaired 07/15/2012  . Physical exam, routine 12/07/2011  . Cerumen impaction 04/04/2011  . Trapezius muscle spasm 04/04/2011  . Hypertensive heart disease with CHF (congestive heart failure) (HCC) 11/30/2010  . Hyperlipidemia 11/30/2010  . CVA (cerebral infarction) 11/30/2010  . Postmenopausal HRT (hormone replacement therapy) 11/30/2010  . Anxiety 11/30/2010  . Ankle weakness 11/30/2010    CMP     Component Value Date/Time   NA 143 07/08/2018   K 4.3 07/08/2018   CL 102 10/13/2015 1505   CO2 22 10/13/2015 1505   GLUCOSE 118 (H) 10/13/2015 1505   BUN 20 07/08/2018   CREATININE 0.6  07/08/2018   CREATININE 0.79 10/13/2015 1505   CALCIUM 9.1 10/13/2015 1505   PROT 6.3 10/13/2015 1505   ALBUMIN 3.8 10/13/2015 1505   ALBUMIN 3.8 10/13/2015 1505   AST 20 07/08/2018   ALT 18 07/08/2018   ALKPHOS 91 07/08/2018   BILITOT 0.6 10/13/2015 1505   GFRNONAA >60 11/05/2014 0225   GFRAA >60 11/05/2014 0225   Recent Labs    07/08/18  NA 143  K 4.3  BUN 20  CREATININE 0.6   Recent Labs    07/08/18  AST 20  ALT 18  ALKPHOS 91   Recent Labs    07/08/18  WBC 6.1  HGB 12.4  HCT 36  PLT 173   Recent Labs    07/08/18  CHOL 139  LDLCALC 80  TRIG 138   No results found for: [facility] Lab Results  Component Value Date   TSH 4.13 07/08/2018   Lab Results  Component Value Date   HGBA1C 5.6 04/10/2017   Lab Results  Component Value Date   CHOL 139 07/08/2018   HDL 35 07/08/2018   LDLCALC 80 07/08/2018   LDLDIRECT 190.1 05/06/2014   TRIG 138 07/08/2018   CHOLHDL 7 05/06/2014    Significant Diagnostic Results in last 30 days:  No results found.  Assessment and Plan  Chronic diastolic heart failure (HCC) No reported exacerbations; continue atenolol twice daily and ASA 81 mg daily; patient is not on a diuretic but weights are followed  Osteoporosis Continues without fracture; continue vitamin D 50,000 units weekly  Hyperlipidemia Controlled; continue Lipitor 40 mg daily and Zetia 10 mg daily     Merrilee Seashore, MD

## 2018-10-06 NOTE — Assessment & Plan Note (Signed)
Continues without fracture; continue vitamin D 50,000 units weekly 

## 2018-10-16 ENCOUNTER — Other Ambulatory Visit: Payer: Self-pay | Admitting: Internal Medicine

## 2018-10-16 MED ORDER — TRAMADOL HCL 50 MG PO TABS: 50.0000 mg | ORAL_TABLET | Freq: Three times a day (TID) | ORAL | 0 refills | Status: DC

## 2018-11-04 ENCOUNTER — Encounter: Payer: Self-pay | Admitting: Internal Medicine

## 2018-11-04 ENCOUNTER — Non-Acute Institutional Stay (SKILLED_NURSING_FACILITY): Payer: Medicare Other | Admitting: Internal Medicine

## 2018-11-04 DIAGNOSIS — I110 Hypertensive heart disease with heart failure: Secondary | ICD-10-CM

## 2018-11-04 DIAGNOSIS — J42 Unspecified chronic bronchitis: Secondary | ICD-10-CM | POA: Diagnosis not present

## 2018-11-04 DIAGNOSIS — F32A Depression, unspecified: Secondary | ICD-10-CM

## 2018-11-04 DIAGNOSIS — I5032 Chronic diastolic (congestive) heart failure: Secondary | ICD-10-CM

## 2018-11-04 DIAGNOSIS — F329 Major depressive disorder, single episode, unspecified: Secondary | ICD-10-CM | POA: Diagnosis not present

## 2018-11-04 NOTE — Progress Notes (Signed)
Location:  [place] Room Number: 161-W Place of Service:  SNF (31)  Beverly Duos, MD  Patient Care Team: Beverly Duos, MD as PCP - General (Internal Medicine)  Extended Emergency Contact Information Primary Emergency Contact: Beverly,Black Address: [address]          [place] [address] Beverly [place] of [place] Phone: [phone] Relation: Daughter Secondary Emergency Contact: Beverly [place] Phone: [phone] Relation: Daughter    Allergies: Sulfa antibiotics  Chief Complaint  Patient presents with  . Medical Management of Chronic Issues    Routine [facility] visit    HPI: Patient is an 85 y.o. female who is being seen for routine issues of hypertension, COPD, and depression.  Past Medical History:  Diagnosis Date  . Acute on chronic diastolic heart failure ([place])   . Aneurysm ([place])    of the eye  . Anxiety 11/30/2010  . Closed fracture of part of upper end of humerus 10/14/2014  . COPD exacerbation ([place])   . CVA (cerebral infarction) 11/30/2010  . Decubitus ulcer of sacral region, stage 4 ([place])   . Dementia due to Parkinson's disease without behavioral disturbance ([place]) 12/31/2014  . Depression 12/23/2015  . History of stroke 11/25/2013  . Hyperlipidemia   . Hypertension   . Hypertensive heart disease with CHF (congestive heart failure) ([place]) 11/30/2010  . Iron deficiency anemia 12/10/2014  . Leukocytosis 10/15/2014  . Osteoporosis   . Postmenopausal HRT (hormone replacement therapy) 11/30/2010  . Stroke ([place]) 10/2008  . Vascular parkinsonism ([place]) 11/25/2013   Post-stroke     Past Surgical History:  Procedure Laterality Date  . EYE SURGERY    . PARTIAL HYSTERECTOMY  1968    Allergies as of 11/04/2018      Reactions   Sulfa Antibiotics    Cant remember reaction      Medication List       Accurate as of [place] 29, 2020 11:59 PM. If you have any questions, ask your  nurse or doctor.        STOP taking these medications   ergocalciferol 1.25 MG (50000 UT) capsule Commonly known as: VITAMIN D2 Stopped by: Inocencio Homes, MD     TAKE these medications   acetaminophen 325 MG tablet Commonly known as: TYLENOL Take 650 mg by mouth every 6 (six) hours as needed for mild pain.   amLODipine 10 MG tablet Commonly known as: NORVASC TAKE 1 TABLET AT BEDTIME   aspirin 81 MG chewable tablet Chew 81 mg by mouth daily.   atenolol 50 MG tablet Commonly known as: TENORMIN TAKE 1 TABLET TWICE A DAY   atorvastatin 40 MG tablet Commonly known as: LIPITOR TAKE 1 TABLET DAILY   bisacodyl 10 MG suppository Commonly known as: DULCOLAX Place 10 mg rectally as needed for moderate constipation.   carboxymethylcellul-glycerin 0.5-0.9 % ophthalmic solution Commonly known as: REFRESH OPTIVE Place 1 drop into both eyes See admin instructions. 6 times a day   ezetimibe 10 MG tablet Commonly known as: ZETIA Take 10 mg by mouth daily.   ferrous sulfate 325 (65 FE) MG tablet Take 325 mg by mouth daily with breakfast.   fexofenadine 180 MG tablet Commonly known as: ALLEGRA Take 180 mg by mouth daily.   LORazepam 0.5 MG tablet Commonly known as: Ativan Take 1 tablet (0.5 mg total) by mouth every 8 (eight) hours.   magnesium hydroxide 400 MG/5ML suspension Commonly known as:  MILK OF MAGNESIA Take 30 mLs by mouth daily as needed for mild constipation.   memantine 10 MG tablet Commonly known as: NAMENDA Take 10 mg by mouth 2 (two) times daily.   mirtazapine 30 MG tablet Commonly known as: REMERON Take 30 mg by mouth at bedtime.   polyethylene glycol 17 g packet Commonly known as: MIRALAX / GLYCOLAX Take 17 g by mouth daily. To be hold for diarrhea   potassium chloride SA 20 MEQ tablet Commonly known as: K-DUR Take 20 mEq by mouth daily.   sennosides-docusate sodium 8.6-50 MG tablet Commonly known as: SENOKOT-S Take 2 tablets by mouth at  bedtime.   Systane 0.4-0.3 % Soln Generic drug: Polyethyl Glycol-Propyl Glycol Apply 2 drops to eye 2 (two) times daily.   Tab-A-Vite Tabs Take 1 tablet by mouth daily.   traMADol 50 MG tablet Commonly known as: ULTRAM Take 1 tablet (50 mg total) by mouth every 8 (eight) hours. Give 1 tablet by mouth every 8 hours scheduled for pain   Vitamin D3 1.25 MG (50000 UT) Caps Take 1 capsule by mouth once a week. x20 weeks       No orders of the defined types were placed in this encounter.   Immunization History  Administered Date(s) Administered  . Influenza,inj,Quad PF,6+ Mos 04/16/2013, 05/06/2014  . Influenza-Unspecified 02/03/2015, 02/08/2016, 03/15/2017, 02/15/2018  . PPD Test 10/14/2014  . Pneumococcal Conjugate-13 01/05/2017    Social History   Tobacco Use  . Smoking status: Former Smoker    Packs/day: 0.50    Years: 50.00    Pack years: 25.00  . Smokeless tobacco: Never Used  Substance Use Topics  . Alcohol use: No    Alcohol/week: 0.0 standard drinks    Review of Systems  DATA OBTAINED: from patient, nurse GENERAL:  no fevers, fatigue, appetite changes SKIN: No itching, rash HEENT: No complaint RESPIRATORY: No cough, wheezing, SOB CARDIAC: No chest pain, palpitations, lower extremity edema  GI: No abdominal pain, No N/V/D or constipation, No heartburn or reflux  GU: No dysuria, frequency or urgency, or incontinence  MUSCULOSKELETAL: No unrelieved bone/joint pain NEUROLOGIC: No headache, dizziness  PSYCHIATRIC: No overt anxiety or sadness  Vitals:   11/04/18 0957  BP: 94/63  Pulse: 63  Resp: 18  Temp: 97.6 F (36.4 C)   Body mass index is 23.96 kg/m. Physical Exam  GENERAL APPEARANCE: Alert, conversant, No acute distress  SKIN: No diaphoresis rash HEENT: Unremarkable RESPIRATORY: Breathing is even, unlabored. Lung sounds are clear   CARDIOVASCULAR: Heart RRR no murmurs, rubs or gallops. No peripheral edema  GASTROINTESTINAL: Abdomen is soft,  non-tender, not distended w/ normal bowel sounds.  GENITOURINARY: Bladder non tender, not distended  MUSCULOSKELETAL: No abnormal joints or musculature NEUROLOGIC: Cranial nerves 2-12 grossly intact. Moves all extremities PSYCHIATRIC: Mood and affect appropriate to situation, no behavioral issues  Patient Active Problem List   Diagnosis Date Noted  . Failure to thrive in adult 05/28/2018  . Allergic rhinitis 03/31/2018  . Osteoporosis 03/18/2016  . Depression 12/23/2015  . Chronic diastolic heart failure (HCC) 09/05/2015  . Right leg pain 08/05/2015  . COPD (chronic obstructive pulmonary disease) (HCC) 06/25/2015  . Loss of weight 01/21/2015  . Anemia, iron deficiency 01/05/2015  . Acute upper respiratory infection 01/05/2015  . Iron deficiency anemia 12/10/2014  . Stomach discomfort 11/30/2014  . Subacute confusional state 11/30/2014  . Acute respiratory failure with hypoxia (HCC) 11/11/2014  . AKI (acute kidney injury) (HCC) 11/11/2014  . Hypernatremia 11/11/2014  .  Hypokalemia 11/09/2014  . COPD exacerbation (HCC)   . Acute on chronic diastolic heart failure (HCC)   . Hypoxia   . SOB (shortness of breath)   . Pressure ulcer stage II 11/02/2014  . Palliative care encounter 11/02/2014  . HCAP (healthcare-associated pneumonia) 11/01/2014  . Leukocytosis 10/15/2014  . Closed fracture of part of upper end of humerus 10/14/2014  . Poor balance 11/25/2013  . History of stroke 11/25/2013  . 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 Myalgia 09/05/2012  . Disequilibrium 09/05/2012  . Hypertension, essential 09/05/2012  . 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 Mobility impaired 07/15/2012  . Physical exam, routine 12/07/2011  . Cerumen impaction 04/04/2011  . Trapezius muscle spasm 04/04/2011  . Hypertensive heart disease with CHF (congestive heart failure) (HCC) 11/30/2010  . Hyperlipidemia 11/30/2010  . CVA (cerebral infarction) 11/30/2010  . Postmenopausal HRT (hormone  replacement therapy) 11/30/2010  . Anxiety 11/30/2010  . Ankle weakness 11/30/2010    CMP     Component Value Date/Time   NA 143 07/08/2018   K 4.3 07/08/2018   CL 102 10/13/2015 1505   CO2 22 10/13/2015 1505   GLUCOSE 118 (H) 10/13/2015 1505   BUN 20 07/08/2018   CREATININE 0.6 07/08/2018   CREATININE 0.79 10/13/2015 1505   CALCIUM 9.1 10/13/2015 1505   PROT 6.3 10/13/2015 1505   ALBUMIN 3.8 10/13/2015 1505   ALBUMIN 3.8 10/13/2015 1505   AST 20 07/08/2018   ALT 18 07/08/2018   ALKPHOS 91 07/08/2018   BILITOT 0.6 10/13/2015 1505   GFRNONAA >60 11/05/2014 0225   GFRAA >60 11/05/2014 0225   Recent Labs    07/08/18  NA 143  K 4.3  BUN 20  CREATININE 0.6   Recent Labs    07/08/18  AST 20  ALT 18  ALKPHOS 91   Recent Labs    07/08/18  WBC 6.1  HGB 12.4  HCT 36  PLT 173   Recent Labs    07/08/18  CHOL 139  LDLCALC 80  TRIG 138   [facility] Lab Results  Component Value Date   TSH 4.13 07/08/2018   Lab Results  Component Value Date   HGBA1C 5.6 04/10/2017   Lab Results  Component Value Date   CHOL 139 07/08/2018   HDL 35 07/08/2018   LDLCALC 80 07/08/2018   LDLDIRECT 190.1 05/06/2014   TRIG 138 07/08/2018   CHOLHDL 7 05/06/2014    Significant Diagnostic Results in last 30 days:  No results found.  Assessment and Plan  Hypertensive heart disease with CHF (congestive heart failure) (HCC) Controlled; continue Norvasc 10 mg daily and Tenormin 50 mg twice daily  COPD (chronic obstructive pulmonary disease) (HCC) Continues without exacerbation; continue Allegra 180 mg daily and PRN albuterol  Depression Stable; continue Remeron 30 mg nightly      Margit HanksAnne D Kyiesha Millward, MD

## 2018-11-09 ENCOUNTER — Encounter: Payer: Self-pay | Admitting: Internal Medicine

## 2018-11-09 NOTE — Assessment & Plan Note (Signed)
Controlled; continue Norvasc 10 mg daily and Tenormin 50 mg twice daily

## 2018-11-09 NOTE — Assessment & Plan Note (Signed)
Stable; continue Remeron 30 mg nightly

## 2018-11-09 NOTE — Assessment & Plan Note (Signed)
Continues without exacerbation; continue Allegra 180 mg daily and PRN albuterol

## 2018-11-26 ENCOUNTER — Encounter: Payer: Self-pay | Admitting: Internal Medicine

## 2018-11-26 ENCOUNTER — Non-Acute Institutional Stay (SKILLED_NURSING_FACILITY): Payer: Medicare Other | Admitting: Internal Medicine

## 2018-11-26 DIAGNOSIS — G214 Vascular parkinsonism: Secondary | ICD-10-CM

## 2018-11-26 DIAGNOSIS — J3089 Other allergic rhinitis: Secondary | ICD-10-CM | POA: Diagnosis not present

## 2018-11-26 DIAGNOSIS — M818 Other osteoporosis without current pathological fracture: Secondary | ICD-10-CM

## 2018-11-26 NOTE — Progress Notes (Signed)
Location:  [place] Room Number: 854-O Place of Service:  SNF (31)  [name], MD  Patient Care Team: [name], MD as PCP - General (Internal Medicine)  Extended Emergency Contact Information Primary Emergency Contact: [name],[name] Address: [address]          [place] [address] [name] of [place] Phone: [phone] Relation: Daughter Secondary Emergency Contact: [name] States of [place] Phone: [phone] Relation: Daughter    Allergies: Sulfa antibiotics  Chief Complaint  Patient presents with  . Medical Management of Chronic Issues    Routine [facility] SNF visit  . Immunizations    PPSV-23    HPI: Patient is an 85 y.o. female who is being seen for routine issues of allergic rhinitis, vascular Parkinson's, and osteoporosis.  Past Medical History:  Diagnosis Date  . Acute on chronic diastolic heart failure ([place])   . 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 Anxiety 11/30/2010  . Closed fracture of part of upper end of humerus 10/14/2014  . COPD exacerbation ([place])   . CVA (cerebral infarction) 11/30/2010  . Decubitus ulcer of sacral region, stage 4 ([place])   . Dementia due to Parkinson's disease without behavioral disturbance ([place]) 12/31/2014  . Depression 12/23/2015  . History of stroke 11/25/2013  . Hyperlipidemia   . Hypertension   . Hypertensive heart disease with CHF (congestive heart failure) ([place]) 11/30/2010  . Iron deficiency anemia 12/10/2014  . Leukocytosis 10/15/2014  . Osteoporosis   . Postmenopausal HRT (hormone replacement therapy) 11/30/2010  . Stroke ([place]) 10/2008  . 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 EYE SURGERY    . PARTIAL HYSTERECTOMY  1968    Allergies as of 11/26/2018      Reactions   Sulfa Antibiotics    Cant remember reaction      Medication List       Accurate as of November 26, 2018 11:59 PM. If you have any questions, ask your nurse or doctor.        STOP taking these medications   Systane 0.4-0.3 % Soln Generic drug: Polyethyl Glycol-Propyl Glycol Stopped by: Inocencio Homes, MD     TAKE these medications   acetaminophen 325 MG tablet Commonly known as: TYLENOL Take 650 mg by mouth every 6 (six) hours as needed for mild pain.   amLODipine 10 MG tablet Commonly known as: NORVASC TAKE 1 TABLET AT BEDTIME   aspirin 81 MG chewable tablet Chew 81 mg by mouth daily.   atenolol 50 MG tablet Commonly known as: TENORMIN TAKE 1 TABLET TWICE A DAY   atorvastatin 40 MG tablet Commonly known as: LIPITOR TAKE 1 TABLET DAILY   bisacodyl 10 MG suppository Commonly known as: DULCOLAX Place 10 mg rectally as needed for moderate constipation.   carboxymethylcellul-glycerin 0.5-0.9 % ophthalmic solution Commonly known as: REFRESH OPTIVE Place 1 drop into both eyes. 6 times a day   ezetimibe 10 MG tablet Commonly known as: ZETIA Take 10 mg by mouth daily.   ferrous sulfate 325 (65 FE) MG tablet Take 325 mg by mouth daily with breakfast.   fexofenadine 180 MG tablet Commonly known as: ALLEGRA Take 180 mg by mouth daily.   LORazepam 0.5 MG tablet Commonly known as: Ativan Take 1 tablet (0.5 mg total) by mouth every 8 (eight) hours.   magnesium hydroxide 400 MG/5ML  suspension Commonly known as: MILK OF MAGNESIA Take 30 mLs by mouth daily as needed for mild constipation.   memantine 10 MG tablet Commonly known as: NAMENDA Take 10 mg by mouth 2 (two) times daily.   mirtazapine 30 MG tablet Commonly known as: REMERON Take 30 mg by mouth at bedtime.   Pataday 0.1 % ophthalmic solution Generic drug: olopatadine Place 1 drop into both eyes daily.   polyethylene glycol 17 g packet Commonly known as: MIRALAX / GLYCOLAX Take 17 g by mouth daily. To be hold for diarrhea   potassium chloride SA 20 MEQ tablet Commonly known as: K-DUR Take 20 mEq by  mouth daily.   sennosides-docusate sodium 8.6-50 MG tablet Commonly known as: SENOKOT-S Take 2 tablets by mouth at bedtime.   Tab-A-Vite Tabs Take 1 tablet by mouth daily.   traMADol 50 MG tablet Commonly known as: ULTRAM Take 1 tablet (50 mg total) by mouth every 8 (eight) hours. Give 1 tablet by mouth every 8 hours scheduled for pain   Vitamin D3 1.25 MG (50000 UT) Caps Take 1 capsule by mouth once a week. x20 weeks       No orders of the defined types were placed in this encounter.   Immunization History  Administered Date(s) Administered  . Influenza,inj,Quad PF,6+ Mos 04/16/2013, 05/06/2014  . Influenza-Unspecified 02/03/2015, 02/08/2016, 03/15/2017, 02/15/2018  . PPD Test 10/14/2014  . Pneumococcal Conjugate-13 01/05/2017    Social History   Tobacco Use  . Smoking status: Former Smoker    Packs/day: 0.50    Years: 50.00    Pack years: 25.00  . Smokeless tobacco: Never Used  Substance Use Topics  . Alcohol use: No    Alcohol/week: 0.0 standard drinks    Review of Systems  DATA OBTAINED: from patient, nurse GENERAL:  no fevers, fatigue, appetite changes SKIN: No itching, rash HEENT: No complaint RESPIRATORY: No cough, wheezing, SOB CARDIAC: No chest pain, palpitations, lower extremity edema  GI: No abdominal pain, No N/V/D or constipation, No heartburn or reflux  GU: No dysuria, frequency or urgency, or incontinence  MUSCULOSKELETAL: No unrelieved bone/joint pain NEUROLOGIC: No headache, dizziness  PSYCHIATRIC: No overt anxiety or sadness  Vitals:   11/26/18 1016  BP: 133/81  Pulse: 64  Resp: 16  Temp: 97.9 F (36.6 C)  SpO2: 96%   Body mass index is 24.1 kg/m. Physical Exam  GENERAL APPEARANCE: Alert, conversant, No acute distress  SKIN: No diaphoresis rash HEENT: Unremarkable RESPIRATORY: Breathing is even, unlabored. Lung sounds are clear   CARDIOVASCULAR: Heart RRR no murmurs, rubs or gallops. No peripheral edema  GASTROINTESTINAL:  Abdomen is soft, non-tender, not distended w/ normal bowel sounds.  GENITOURINARY: Bladder non tender, not distended  MUSCULOSKELETAL: No abnormal joints or musculature NEUROLOGIC: Cranial nerves 2-12 grossly intact. Moves all extremities PSYCHIATRIC: Mood and affect appropriate to situation with some dementia, no behavioral issues  Patient Active Problem List   Diagnosis Date Noted  . Failure to thrive in adult 05/28/2018  . Allergic rhinitis 03/31/2018  . Osteoporosis 03/18/2016  . Depression 12/23/2015  . Chronic diastolic heart failure (HCC) 09/05/2015  . Right leg pain 08/05/2015  . COPD (chronic obstructive pulmonary disease) (HCC) 06/25/2015  . Loss of weight 01/21/2015  . Anemia, iron deficiency 01/05/2015  . Acute upper respiratory infection 01/05/2015  . Iron deficiency anemia 12/10/2014  . Stomach discomfort 11/30/2014  . Subacute confusional state 11/30/2014  . Acute respiratory failure with hypoxia (HCC) 11/11/2014  . AKI (acute kidney injury) (HCC)  11/11/2014  . Hypernatremia 11/11/2014  . Hypokalemia 11/09/2014  . COPD exacerbation (HCC)   . Acute on chronic diastolic heart failure (HCC)   . Hypoxia   . SOB (shortness of breath)   . Pressure ulcer stage II 11/02/2014  . Palliative care encounter 11/02/2014  . HCAP (healthcare-associated pneumonia) 11/01/2014  . Leukocytosis 10/15/2014  . Closed fracture of part of upper end of humerus 10/14/2014  . Poor balance 11/25/2013  . History of stroke 11/25/2013  . 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 Myalgia 09/05/2012  . Disequilibrium 09/05/2012  . Hypertension, essential 09/05/2012  . 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 Mobility impaired 07/15/2012  . Physical exam, routine 12/07/2011  . Cerumen impaction 04/04/2011  . Trapezius muscle spasm 04/04/2011  . Hypertensive heart disease with CHF (congestive heart failure) (HCC) 11/30/2010  . Hyperlipidemia 11/30/2010  . CVA (cerebral infarction)  11/30/2010  . Postmenopausal HRT (hormone replacement therapy) 11/30/2010  . Anxiety 11/30/2010  . Ankle weakness 11/30/2010    CMP     Component Value Date/Time   NA 143 07/08/2018   K 4.3 07/08/2018   CL 102 10/13/2015 1505   CO2 22 10/13/2015 1505   GLUCOSE 118 (H) 10/13/2015 1505   BUN 20 07/08/2018   CREATININE 0.6 07/08/2018   CREATININE 0.79 10/13/2015 1505   CALCIUM 9.1 10/13/2015 1505   PROT 6.3 10/13/2015 1505   ALBUMIN 3.8 10/13/2015 1505   ALBUMIN 3.8 10/13/2015 1505   AST 20 07/08/2018   ALT 18 07/08/2018   ALKPHOS 91 07/08/2018   BILITOT 0.6 10/13/2015 1505   GFRNONAA >60 11/05/2014 0225   GFRAA >60 11/05/2014 0225   Recent Labs    07/08/18  NA 143  K 4.3  BUN 20  CREATININE 0.6   Recent Labs    07/08/18  AST 20  ALT 18  ALKPHOS 91   Recent Labs    07/08/18  WBC 6.1  HGB 12.4  HCT 36  PLT 173   Recent Labs    07/08/18  CHOL 139  LDLCALC 80  TRIG 138   No results found for: [facility] Lab Results  Component Value Date   TSH 4.13 07/08/2018   Lab Results  Component Value Date   HGBA1C 5.6 04/10/2017   Lab Results  Component Value Date   CHOL 139 07/08/2018   HDL 35 07/08/2018   LDLCALC 80 07/08/2018   LDLDIRECT 190.1 05/06/2014   TRIG 138 07/08/2018   CHOLHDL 7 05/06/2014    Significant Diagnostic Results in last 30 days:  No results found.  Assessment and Plan  Allergic rhinitis Chronic and stable; continue Allegra 180 mg daily  Vascular parkinsonism WithPatient is continued to have no difficulty Parkinson's or Parkinson's-like symptoms and is on no medications; continue to monitor  Osteoporosis Continues without fracture; continue vitamin D 50,000 units weekly      Margit HanksAnne D Matison Nuccio, MD

## 2018-11-27 ENCOUNTER — Encounter: Payer: Self-pay | Admitting: Internal Medicine

## 2018-11-27 LAB — VITAMIN D 25 HYDROXY (VIT D DEFICIENCY, FRACTURES): Vit D, 25-Hydroxy: 53.3

## 2018-11-27 NOTE — Assessment & Plan Note (Signed)
WithPatient is continued to have no difficulty Parkinson's or Parkinson's-like symptoms and is on no medications; continue to monitor

## 2018-11-27 NOTE — Assessment & Plan Note (Signed)
Chronic and stable; continue Allegra 180 mg daily 

## 2018-11-27 NOTE — Assessment & Plan Note (Signed)
Continues without fracture; continue vitamin D 50,000 units weekly

## 2018-12-12 LAB — CBC AND DIFFERENTIAL
HCT: 36 (ref 36–46)
Hemoglobin: 12.1 (ref 12.0–16.0)
Neutrophils Absolute: 4
Platelets: 209 (ref 150–399)
WBC: 6.2

## 2018-12-12 LAB — BASIC METABOLIC PANEL
BUN: 27 — AB (ref 4–21)
Creatinine: 0.7 (ref 0.5–1.1)
Glucose: 82
Potassium: 4.1 (ref 3.4–5.3)
Sodium: 142 (ref 137–147)

## 2018-12-13 ENCOUNTER — Non-Acute Institutional Stay (SKILLED_NURSING_FACILITY): Payer: Medicare Other | Admitting: Internal Medicine

## 2018-12-13 DIAGNOSIS — B999 Unspecified infectious disease: Secondary | ICD-10-CM | POA: Diagnosis not present

## 2018-12-13 DIAGNOSIS — U071 COVID-19: Secondary | ICD-10-CM

## 2018-12-13 NOTE — Progress Notes (Signed)
Location:  [place] Room Number: 110-P Place of Service:  SNF (31)  [name], MD  Patient Care Team: [name], MD as PCP - General (Internal Medicine)  Extended Emergency Contact Information Primary Emergency Contact: [name],[name] Address: [address]          [place] [address] [name] of [place] Phone: [phone] Relation: Daughter Secondary Emergency Contact: [name] States of [place] Phone: [phone] Relation: Daughter    Allergies: Sulfa antibiotics  Chief Complaint  Patient presents with  . Acute Visit    HPI: Patient is an 85 y.o. female who is being seen because for 7/28 test from the health department has come back positive for COVID-19.  Patient is currently being treated for UTI, unrelated to the COVID test.  Patient has received 2 L of IV fluids.  Patient has no other symptoms.  Past Medical History:  Diagnosis Date  . Acute on chronic diastolic heart failure ([place])   . 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 Anxiety 11/30/2010  . Closed fracture of part of upper end of humerus 10/14/2014  . COPD exacerbation ([place])   . CVA (cerebral infarction) 11/30/2010  . Decubitus ulcer of sacral region, stage 4 ([place])   . Dementia due to Parkinson's disease without behavioral disturbance ([place]) 12/31/2014  . Depression 12/23/2015  . History of stroke 11/25/2013  . Hyperlipidemia   . Hypertension   . Hypertensive heart disease with CHF (congestive heart failure) ([place]) 11/30/2010  . Iron deficiency anemia 12/10/2014  . Leukocytosis 10/15/2014  . Osteoporosis   . Postmenopausal HRT (hormone replacement therapy) 11/30/2010  . Stroke ([place]) 10/2008  . 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 EYE SURGERY    . PARTIAL HYSTERECTOMY  1968    Allergies as of 12/13/2018      Reactions   Sulfa Antibiotics    Cant remember  reaction      Medication List       Accurate as of December 13, 2018 11:59 PM. If you have any questions, ask your nurse or doctor.        acetaminophen 325 MG tablet Commonly known as: TYLENOL Take 650 mg by mouth every 6 (six) hours as needed for mild pain.   amLODipine 10 MG tablet Commonly known as: NORVASC TAKE 1 TABLET AT BEDTIME   aspirin 81 MG chewable tablet Chew 81 mg by mouth daily.   atenolol 50 MG tablet Commonly known as: TENORMIN TAKE 1 TABLET TWICE A DAY   atorvastatin 40 MG tablet Commonly known as: LIPITOR TAKE 1 TABLET DAILY   bisacodyl 10 MG suppository Commonly known as: DULCOLAX Place 10 mg rectally as needed for moderate constipation.   carboxymethylcellul-glycerin 0.5-0.9 % ophthalmic solution Commonly known as: REFRESH OPTIVE Place 1 drop into both eyes. 6 times a day   ciprofloxacin 500 MG tablet Commonly known as: CIPRO Take 500 mg by mouth 2 (two) times daily.   ezetimibe 10 MG tablet Commonly known as: ZETIA Take 10 mg by mouth daily.   ferrous sulfate 325 (65 FE) MG tablet Take 325 mg by mouth daily with breakfast.   fexofenadine 180 MG tablet Commonly known as: ALLEGRA Take 180 mg by mouth daily.   LORazepam 0.5 MG tablet Commonly known as: Ativan Take 1 tablet (0.5 mg total) by mouth every 8 (eight) hours.  magnesium hydroxide 400 MG/5ML suspension Commonly known as: MILK OF MAGNESIA Take 30 mLs by mouth daily as needed for mild constipation.   memantine 10 MG tablet Commonly known as: NAMENDA Take 10 mg by mouth 2 (two) times daily.   mirtazapine 30 MG tablet Commonly known as: REMERON Take 30 mg by mouth at bedtime.   Pataday 0.1 % ophthalmic solution Generic drug: olopatadine Place 1 drop into both eyes daily.   polyethylene glycol 17 g packet Commonly known as: MIRALAX / GLYCOLAX Take 17 g by mouth daily. To be hold for diarrhea   potassium chloride SA 20 MEQ tablet Commonly known as: K-DUR Take 20 mEq by  mouth daily.   sennosides-docusate sodium 8.6-50 MG tablet Commonly known as: SENOKOT-S Take 2 tablets by mouth at bedtime.   sodium chloride 0.9 % infusion Inject 1,000 mLs into the vein once. 75 mL/hr for another liter, then d/c   Tab-A-Vite Tabs Take 1 tablet by mouth daily.   traMADol 50 MG tablet Commonly known as: ULTRAM Take 1 tablet (50 mg total) by mouth every 8 (eight) hours. Give 1 tablet by mouth every 8 hours scheduled for pain   Vitaline Buffered Zinc-220 220 (50 Zn) MG Tbcr Generic drug: Zinc Sulfate Take 1 tablet by mouth daily. x14 days - ending 12/25/18   vitamin C 1000 MG tablet Take 1,000 mg by mouth daily.   Vitamin D3 1.25 MG (50000 UT) Caps Take 1 capsule by mouth once a week.       No orders of the defined types were placed in this encounter.   Immunization History  Administered Date(s) Administered  . Influenza,inj,Quad PF,6+ Mos 04/16/2013, 05/06/2014  . Influenza-Unspecified 02/03/2015, 02/08/2016, 03/15/2017, 02/15/2018  . PPD Test 10/14/2014  . Pneumococcal Conjugate-13 01/05/2017    Social History   Tobacco Use  . Smoking status: Former Smoker    Packs/day: 0.50    Years: 50.00    Pack years: 25.00  . Smokeless tobacco: Never Used  Substance Use Topics  . Alcohol use: No    Alcohol/week: 0.0 standard drinks    Review of Systems  DATA OBTAINED: from patient, nurse GENERAL:  no fevers, fatigue, appetite changes SKIN: No itching, rash HEENT: No complaint RESPIRATORY: No cough, wheezing, SOB CARDIAC: No chest pain, palpitations, lower extremity edema  GI: No abdominal pain, No N/V/D or constipation, No heartburn or reflux  GU: No dysuria, frequency or urgency, or incontinence  MUSCULOSKELETAL: No unrelieved bone/joint pain NEUROLOGIC: No headache, dizziness  PSYCHIATRIC: No overt anxiety or sadness  Vitals:   12/13/18 1109  BP: 123/76  Pulse: 65  Resp: 17  Temp: 97.7 F (36.5 C)   Body mass index is 24.1 kg/m.  Physical Exam  GENERAL APPEARANCE: Alert, conversant, No acute distress  SKIN: No diaphoresis rash HEENT: Unremarkable RESPIRATORY: Breathing is even, unlabored. Lung sounds are clear   CARDIOVASCULAR: Heart RRR no murmurs, rubs or gallops. No peripheral edema  GASTROINTESTINAL: Abdomen is soft, non-tender, not distended w/ normal bowel sounds.  GENITOURINARY: Bladder non tender, not distended  MUSCULOSKELETAL: No abnormal joints or musculature NEUROLOGIC: Cranial nerves 2-12 grossly intact. Moves all extremities PSYCHIATRIC: Mood and affect appropriate , no behavioral issues  Patient Active Problem List   Diagnosis Date Noted  . Failure to thrive in adult 05/28/2018  . Allergic rhinitis 03/31/2018  . Osteoporosis 03/18/2016  . Depression 12/23/2015  . Chronic diastolic heart failure (HCC) 09/05/2015  . Right leg pain 08/05/2015  . COPD (chronic obstructive pulmonary disease) (  HCC) 06/25/2015  . Loss of weight 01/21/2015  . Anemia, iron deficiency 01/05/2015  . Acute upper respiratory infection 01/05/2015  . Iron deficiency anemia 12/10/2014  . Stomach discomfort 11/30/2014  . Subacute confusional state 11/30/2014  . Acute respiratory failure with hypoxia (HCC) 11/11/2014  . AKI (acute kidney injury) (HCC) 11/11/2014  . Hypernatremia 11/11/2014  . Hypokalemia 11/09/2014  . COPD exacerbation (HCC)   . Acute on chronic diastolic heart failure (HCC)   . Hypoxia   . SOB (shortness of breath)   . Pressure ulcer stage II 11/02/2014  . Palliative care encounter 11/02/2014  . HCAP (healthcare-associated pneumonia) 11/01/2014  . Leukocytosis 10/15/2014  . Closed fracture of part of upper end of humerus 10/14/2014  . Poor balance 11/25/2013  . History of stroke 11/25/2013  . 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 Myalgia 09/05/2012  . Disequilibrium 09/05/2012  . Hypertension, essential 09/05/2012  . 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 Mobility impaired 07/15/2012   . Physical exam, routine 12/07/2011  . Cerumen impaction 04/04/2011  . Trapezius muscle spasm 04/04/2011  . Hypertensive heart disease with CHF (congestive heart failure) (HCC) 11/30/2010  . Hyperlipidemia 11/30/2010  . CVA (cerebral infarction) 11/30/2010  . Postmenopausal HRT (hormone replacement therapy) 11/30/2010  . Anxiety 11/30/2010  . Ankle weakness 11/30/2010    CMP     Component Value Date/Time   NA 143 07/08/2018   K 4.3 07/08/2018   CL 102 10/13/2015 1505   CO2 22 10/13/2015 1505   GLUCOSE 118 (H) 10/13/2015 1505   BUN 20 07/08/2018   CREATININE 0.6 07/08/2018   CREATININE 0.79 10/13/2015 1505   CALCIUM 9.1 10/13/2015 1505   PROT 6.3 10/13/2015 1505   ALBUMIN 3.8 10/13/2015 1505   ALBUMIN 3.8 10/13/2015 1505   AST 20 07/08/2018   ALT 18 07/08/2018   ALKPHOS 91 07/08/2018   BILITOT 0.6 10/13/2015 1505   GFRNONAA >60 11/05/2014 0225   GFRAA >60 11/05/2014 0225   Recent Labs    07/08/18  NA 143  K 4.3  BUN 20  CREATININE 0.6   Recent Labs    07/08/18  AST 20  ALT 18  ALKPHOS 91   Recent Labs    07/08/18  WBC 6.1  HGB 12.4  HCT 36  PLT 173   Recent Labs    07/08/18  CHOL 139  LDLCALC 80  TRIG 138   No results found for: [facility] Lab Results  Component Value Date   TSH 4.13 07/08/2018   Lab Results  Component Value Date   HGBA1C 5.6 04/10/2017   Lab Results  Component Value Date   CHOL 139 07/08/2018   HDL 35 07/08/2018   LDLCALC 80 07/08/2018   LDLDIRECT 190.1 05/06/2014   TRIG 138 07/08/2018   CHOLHDL 7 05/06/2014    Significant Diagnostic Results in last 30 days:  No results found.  Assessment and Plan  COVID-19 positive/isolation for infectious diseases airborne/UTI- patient is at the end of her treatment for UTI; patient has no symptoms other than decreased p.o. intake from her UTI.  She is received 2 L of IV fluids and is going to receive 1/3 L.  Because of the timing of the resolved patient is leaving the isolation  unit today.  [facility] if patient is asymptomatic they only need to be isolated for 10 days.     Margit HanksAnne D Aviraj Kentner, MD

## 2018-12-16 ENCOUNTER — Encounter: Payer: Self-pay | Admitting: Internal Medicine

## 2018-12-16 DIAGNOSIS — U071 COVID-19: Secondary | ICD-10-CM | POA: Insufficient documentation

## 2018-12-16 DIAGNOSIS — B999 Unspecified infectious disease: Secondary | ICD-10-CM | POA: Insufficient documentation

## 2018-12-20 ENCOUNTER — Encounter: Payer: Self-pay | Admitting: Internal Medicine

## 2018-12-20 ENCOUNTER — Other Ambulatory Visit: Payer: Self-pay | Admitting: Internal Medicine

## 2018-12-20 ENCOUNTER — Non-Acute Institutional Stay (SKILLED_NURSING_FACILITY): Payer: Medicare Other | Admitting: Internal Medicine

## 2018-12-20 DIAGNOSIS — I5032 Chronic diastolic (congestive) heart failure: Secondary | ICD-10-CM | POA: Diagnosis not present

## 2018-12-20 DIAGNOSIS — E782 Mixed hyperlipidemia: Secondary | ICD-10-CM

## 2018-12-20 DIAGNOSIS — D508 Other iron deficiency anemias: Secondary | ICD-10-CM

## 2018-12-20 MED ORDER — LORAZEPAM 0.5 MG PO TABS: 0.5000 mg | ORAL_TABLET | Freq: Three times a day (TID) | ORAL | 2 refills | Status: DC

## 2018-12-20 NOTE — Progress Notes (Signed)
[facility] Room Number: 411-P Place of Service:  SNF (31)  Margit HanksAlexander, Ahmet Schank D, MD  Patient Care Team: Margit HanksAlexander, Devyn Griffing D, MD as PCP - General (Internal Medicine)  Extended Emergency Contact Information Primary Emergency Contact: [name],[name] Address: [address]          [place] [id number] [place] Home Phone: [phone] Relation: Daughter Secondary Emergency Contact: [name],[name]  [place] Home Phone: [phone] Relation: Daughter    Allergies: Sulfa antibiotics  Chief Complaint  Patient presents with  . Medical Management of Chronic Issues    Routine [facility] visit    HPI: Patient is an 85 y.o. female who is being seen for routine issues of chronic diastolic congestive heart failure, iron deficiency anemia, and hyperlipidemia.  Past Medical History:  Diagnosis Date  . Acute on chronic diastolic heart failure (HCC)   . Aneurysm (HCC)    of the eye  . Anxiety 11/30/2010  . Closed fracture of part of upper end of humerus 10/14/2014  . COPD exacerbation (HCC)   . CVA (cerebral infarction) 11/30/2010  . Decubitus ulcer of sacral region, stage 4 (HCC)   . 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 Depression 12/23/2015  . History of stroke 11/25/2013  . Hyperlipidemia   . Hypertension   . Hypertensive heart disease with CHF (congestive heart failure) (HCC) 11/30/2010  . Iron deficiency anemia 12/10/2014  . Leukocytosis 10/15/2014  . Osteoporosis   . Postmenopausal HRT (hormone replacement therapy) 11/30/2010  . Stroke (HCC) 10/2008  . 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 EYE SURGERY    . PARTIAL HYSTERECTOMY  1968    Allergies as of 12/20/2018      Reactions   Sulfa Antibiotics    Cant remember reaction      Medication List       Accurate as of December 20, 2018 11:59 PM. If you have any questions, ask your nurse or doctor.        STOP taking these medications   sodium chloride 0.9 % infusion Stopped by: Merrilee SeashoreAnne Doryce Mcgregory, MD     TAKE these medications   acetaminophen 325 MG tablet Commonly known as: TYLENOL Take 650 mg by mouth every 6 (six) hours as needed for mild pain.   amLODipine 10 MG tablet Commonly known as: NORVASC TAKE 1 TABLET AT BEDTIME   aspirin 81 MG chewable tablet Chew 81 mg by mouth daily.   atenolol 50 MG tablet Commonly known as: TENORMIN TAKE 1 TABLET TWICE A DAY   atorvastatin 40 MG tablet Commonly known as: LIPITOR TAKE 1 TABLET DAILY   bisacodyl 10 MG suppository Commonly known as: DULCOLAX Place 10 mg rectally as needed for moderate constipation.   carboxymethylcellul-glycerin 0.5-0.9 % ophthalmic solution Commonly known as: REFRESH OPTIVE Place 1 drop into both eyes. 6 times a day   ezetimibe 10 MG tablet Commonly known as: ZETIA Take 10 mg by mouth daily.   ferrous sulfate 325 (65 FE) MG tablet Take 325 mg by mouth daily with breakfast.   fexofenadine 180 MG tablet Commonly known as: ALLEGRA Take 180 mg by mouth daily.   LORazepam 0.5 MG tablet Commonly known as: Ativan Take 1 tablet (0.5 mg total) by mouth every 8 (eight) hours.   magnesium hydroxide 400 MG/5ML suspension Commonly known as: MILK OF MAGNESIA  Take 30 mLs by mouth daily as needed for mild constipation.   memantine 10 MG tablet Commonly known as: NAMENDA Take 10 mg by mouth 2 (two) times daily.   mirtazapine 30 MG tablet Commonly known as: REMERON Take 30 mg by mouth at bedtime.   Pataday 0.1 % ophthalmic solution Generic drug: olopatadine Place 1 drop into both eyes daily.   polyethylene glycol 17 g packet Commonly known as: MIRALAX / GLYCOLAX Take 17 g by mouth daily. To be hold for diarrhea   potassium chloride SA 20 MEQ tablet Commonly known as: K-DUR Take 20 mEq by mouth daily.   sennosides-docusate  sodium 8.6-50 MG tablet Commonly known as: SENOKOT-S Take 2 tablets by mouth at bedtime.   Tab-A-Vite Tabs Take 1 tablet by mouth daily.   traMADol 50 MG tablet Commonly known as: ULTRAM Take 1 tablet (50 mg total) by mouth every 8 (eight) hours. Give 1 tablet by mouth every 8 hours scheduled for pain   Vitaline Buffered Zinc-220 220 (50 Zn) MG Tbcr Generic drug: Zinc Sulfate Take 1 tablet by mouth daily. x14 days - ending 12/25/18   vitamin C 1000 MG tablet Take 1,000 mg by mouth daily.   Vitamin D3 1.25 MG (50000 UT) Caps Take 1 capsule by mouth once a week.       No orders of the defined types were placed in this encounter.   Immunization History  Administered Date(s) Administered  . Influenza,inj,Quad PF,6+ Mos 04/16/2013, 05/06/2014  . Influenza-Unspecified 02/03/2015, 02/08/2016, 03/15/2017, 02/15/2018  . PPD Test 10/14/2014  . Pneumococcal Conjugate-13 01/05/2017    Social History   Tobacco Use  . Smoking status: Former Smoker    Packs/day: 0.50    Years: 50.00    Pack years: 25.00  . Smokeless tobacco: Never Used  Substance Use Topics  . Alcohol use: No    Alcohol/week: 0.0 standard drinks    Review of Systems  DATA OBTAINED: from patient limited; nursing- concerns only for fair p.o. intake GENERAL:  no fevers, fatigue, appetite changes SKIN: No itching, rash HEENT: No complaint RESPIRATORY: No cough, wheezing, SOB CARDIAC: No chest pain, palpitations, lower extremity edema  GI: No abdominal pain, No N/V/D or constipation, No heartburn or reflux  GU: No dysuria, frequency or urgency, or incontinence  MUSCULOSKELETAL: No unrelieved bone/joint pain NEUROLOGIC: No headache, dizziness  PSYCHIATRIC: No overt anxiety or sadness  Vitals:   12/20/18 0932 12/20/18 0933  BP: (!) 118/56 137/77  Pulse: 64   Resp: 18   Temp: 97.9 F (36.6 C)    Body mass index is 24.1 kg/m. Physical Exam  GENERAL APPEARANCE: Alert, minimally conversant, No acute  distress  SKIN: No diaphoresis rash HEENT: Unremarkable RESPIRATORY: Breathing is even, unlabored. Lung sounds are clear   CARDIOVASCULAR: Heart RRR no murmurs, rubs or gallops. No peripheral edema  GASTROINTESTINAL: Abdomen is soft, non-tender, not distended w/ normal bowel sounds.  GENITOURINARY: Bladder non tender, not distended  MUSCULOSKELETAL: No abnormal joints or musculature NEUROLOGIC: Cranial nerves 2-12 grossly intact. Moves all extremities PSYCHIATRIC: Mood and affect appropriate to situation, somewhat flat affect,, no behavioral issues  Patient Active Problem List   Diagnosis Date Noted  . Real time reverse transcriptase PCR positive for COVID-19 virus 12/16/2018  . Infection requiring airborne isolation precautions 12/16/2018  . Failure to thrive in adult 05/28/2018  . Allergic rhinitis 03/31/2018  . Osteoporosis 03/18/2016  . Depression 12/23/2015  . Chronic diastolic heart failure ([place]) 09/05/2015  . Right leg pain 08/05/2015  .  COPD (chronic obstructive pulmonary disease) (HCC) 06/25/2015  . Loss of weight 01/21/2015  . Anemia, iron deficiency 01/05/2015  . Acute upper respiratory infection 01/05/2015  . Iron deficiency anemia 12/10/2014  . Stomach discomfort 11/30/2014  . Subacute confusional state 11/30/2014  . Acute respiratory failure with hypoxia (HCC) 11/11/2014  . AKI (acute kidney injury) (HCC) 11/11/2014  . Hypernatremia 11/11/2014  . Hypokalemia 11/09/2014  . COPD exacerbation (HCC)   . Acute on chronic diastolic heart failure (HCC)   . Hypoxia   . SOB (shortness of breath)   . Pressure ulcer stage II 11/02/2014  . Palliative care encounter 11/02/2014  . HCAP (healthcare-associated pneumonia) 11/01/2014  . Leukocytosis 10/15/2014  . Closed fracture of part of upper end of humerus 10/14/2014  . Poor balance 11/25/2013  . History of stroke 11/25/2013  . 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 Myalgia 09/05/2012  . Disequilibrium 09/05/2012  .  Hypertension, essential 09/05/2012  . 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 Mobility impaired 07/15/2012  . Physical exam, routine 12/07/2011  . Cerumen impaction 04/04/2011  . Trapezius muscle spasm 04/04/2011  . Hypertensive heart disease with CHF (congestive heart failure) (HCC) 11/30/2010  . Hyperlipidemia 11/30/2010  . CVA (cerebral infarction) 11/30/2010  . Postmenopausal HRT (hormone replacement therapy) 11/30/2010  . Anxiety 11/30/2010  . Ankle weakness 11/30/2010    CMP     Component Value Date/Time   NA 142 12/12/2018   K 4.1 12/12/2018   CL 102 10/13/2015 1505   CO2 22 10/13/2015 1505   GLUCOSE 118 (H) 10/13/2015 1505   BUN 27 (A) 12/12/2018   CREATININE 0.7 12/12/2018   CREATININE 0.79 10/13/2015 1505   CALCIUM 9.1 10/13/2015 1505   PROT 6.3 10/13/2015 1505   ALBUMIN 3.8 10/13/2015 1505   ALBUMIN 3.8 10/13/2015 1505   AST 20 07/08/2018   ALT 18 07/08/2018   ALKPHOS 91 07/08/2018   BILITOT 0.6 10/13/2015 1505   GFRNONAA >60 11/05/2014 0225   GFRAA >60 11/05/2014 0225   Recent Labs    07/08/18 12/12/18  NA 143 142  K 4.3 4.1  BUN 20 27*  CREATININE 0.6 0.7   Recent Labs    07/08/18  AST 20  ALT 18  ALKPHOS 91   Recent Labs    07/08/18 12/12/18  WBC 6.1 6.2  NEUTROABS  --  4  HGB 12.4 12.1  HCT 36 36  PLT 173 209   Recent Labs    07/08/18  CHOL 139  LDLCALC 80  TRIG 138   No results found for: [facility] Lab Results  Component Value Date   TSH 4.13 07/08/2018   Lab Results  Component Value Date   HGBA1C 5.6 04/10/2017   Lab Results  Component Value Date   CHOL 139 07/08/2018   HDL 35 07/08/2018   LDLCALC 80 07/08/2018   LDLDIRECT 190.1 05/06/2014   TRIG 138 07/08/2018   CHOLHDL 7 05/06/2014    Significant Diagnostic Results in last 30 days:  No results found.  Assessment and Plan  Chronic diastolic heart failure (HCC) .  Patient continues without exacerbation; continue ASA 81 mg daily and atenolol twice daily;  patient is not on diuretic; weights are followed  Iron deficiency anemia Patient's recent hemoglobin is 12.1 which is down from a year ago but still decent for a patient who is failing to thrive; continue iron 325 mg daily  Hyperlipidemia LDL 80, HDL 35, triglycerides 138; reasonable controlled on Lipitor 40 mg daily and Zetia 10 mg daily  [name], MD

## 2018-12-22 ENCOUNTER — Encounter: Payer: Self-pay | Admitting: Internal Medicine

## 2018-12-22 NOTE — Assessment & Plan Note (Signed)
LDL 80, HDL 35, triglycerides 138; reasonable controlled on Lipitor 40 mg daily and Zetia 10 mg daily

## 2018-12-22 NOTE — Assessment & Plan Note (Signed)
.    Patient continues without exacerbation; continue ASA 81 mg daily and atenolol twice daily; patient is not on diuretic; weights are followed

## 2018-12-22 NOTE — Assessment & Plan Note (Signed)
Patient's recent hemoglobin is 12.1 which is down from a year ago but still decent for a patient who is failing to thrive; continue iron 325 mg daily

## 2019-01-01 LAB — CBC AND DIFFERENTIAL
HCT: 35 — AB (ref 36–46)
Hemoglobin: 12 (ref 12.0–16.0)
Neutrophils Absolute: 3
Platelets: 155 (ref 150–399)
WBC: 6.4

## 2019-01-01 LAB — BASIC METABOLIC PANEL
BUN: 25 — AB (ref 4–21)
Creatinine: 0.6 (ref 0.5–1.1)
Glucose: 93
Potassium: 4.7 (ref 3.4–5.3)
Sodium: 139 (ref 137–147)

## 2019-01-27 ENCOUNTER — Non-Acute Institutional Stay (SKILLED_NURSING_FACILITY): Payer: Medicare Other | Admitting: Internal Medicine

## 2019-01-27 ENCOUNTER — Encounter: Payer: Self-pay | Admitting: Internal Medicine

## 2019-01-27 DIAGNOSIS — R627 Adult failure to thrive: Secondary | ICD-10-CM | POA: Diagnosis not present

## 2019-01-27 DIAGNOSIS — D508 Other iron deficiency anemias: Secondary | ICD-10-CM

## 2019-01-27 DIAGNOSIS — F419 Anxiety disorder, unspecified: Secondary | ICD-10-CM

## 2019-01-27 NOTE — Assessment & Plan Note (Signed)
Recent hemoglobin 14.6, which is excellent; continue iron 325 mg daily

## 2019-01-27 NOTE — Progress Notes (Signed)
Location:  [facility] Service:   SNF  [name], MD  Patient Care Team: [name], MD as PCP - General (Internal Medicine)  Extended Emergency Contact Information Primary Emergency Contact: [name],[name] Address: [address]          [place] [address] [name] of [place] Phone: [phone] Relation: Daughter Secondary Emergency Contact: [name] [place] Phone: [phone] Relation: Daughter    Allergies: Sulfa antibiotics  Chief Complaint  Patient presents with  . Medical Management of Chronic Issues    HPI: Patient is 85 y.o. female who is being seen for routine issues of iron deficiency anemia, anxiety, and failure to thrive.  Past Medical History:  Diagnosis Date  . Acute on chronic diastolic heart failure ([place])   . Aneurysm ([place])    of the eye  . Anxiety 11/30/2010  . Closed fracture of part of upper end of humerus 10/14/2014  . COPD exacerbation ([place])   . CVA (cerebral infarction) 11/30/2010  . Decubitus ulcer of sacral region, stage 4 ([place])   . 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 Depression 12/23/2015  . History of stroke 11/25/2013  . Hyperlipidemia   . Hypertension   . Hypertensive heart disease with CHF (congestive heart failure) ([place]) 11/30/2010  . Iron deficiency anemia 12/10/2014  . Leukocytosis 10/15/2014  . Osteoporosis   . Postmenopausal HRT (hormone replacement therapy) 11/30/2010  . Stroke ([place]) 10/2008  . Vascular parkinsonism ([place]) 11/25/2013   Post-stroke     Past Surgical History:  Procedure Laterality Date  . EYE SURGERY    . PARTIAL HYSTERECTOMY  1968    Allergies as of 01/27/2019      Reactions   Sulfa Antibiotics    Cant remember reaction      Medication List       Accurate as of January 27, 2019 10:12 PM. If you have any questions, ask your nurse or doctor.        acetaminophen 325 MG  tablet Commonly known as: TYLENOL Take 650 mg by mouth every 6 (six) hours as needed for mild pain.   amLODipine 10 MG tablet Commonly known as: NORVASC TAKE 1 TABLET AT BEDTIME   aspirin 81 MG chewable tablet Chew 81 mg by mouth daily.   atenolol 50 MG tablet Commonly known as: TENORMIN TAKE 1 TABLET TWICE A DAY   atorvastatin 40 MG tablet Commonly known as: LIPITOR TAKE 1 TABLET DAILY   bisacodyl 10 MG suppository Commonly known as: DULCOLAX Place 10 mg rectally as needed for moderate constipation.   carboxymethylcellul-glycerin 0.5-0.9 % ophthalmic solution Commonly known as: REFRESH OPTIVE Place 1 drop into both eyes. 6 times a day   ezetimibe 10 MG tablet Commonly known as: ZETIA Take 10 mg by mouth daily.   ferrous sulfate 325 (65 FE) MG tablet Take 325 mg by mouth daily with breakfast.   fexofenadine 180 MG tablet Commonly known as: ALLEGRA Take 180 mg by mouth daily.   LORazepam 0.5 MG tablet Commonly known as: Ativan Take 1 tablet (0.5 mg total) by mouth every 8 (eight) hours.   magnesium hydroxide 400 MG/5ML suspension Commonly known as: MILK OF MAGNESIA Take 30 mLs by mouth daily as needed for mild constipation.   memantine 10 MG tablet Commonly known as: NAMENDA Take 10 mg by mouth 2 (two) times daily.   mirtazapine 30 MG tablet Commonly known as:  REMERON Take 30 mg by mouth at bedtime.   Pataday 0.1 % ophthalmic solution Generic drug: olopatadine Place 1 drop into both eyes daily.   polyethylene glycol 17 g packet Commonly known as: MIRALAX / GLYCOLAX Take 17 g by mouth daily. To be hold for diarrhea   potassium chloride SA 20 MEQ tablet Commonly known as: K-DUR Take 20 mEq by mouth daily.   sennosides-docusate sodium 8.6-50 MG tablet Commonly known as: SENOKOT-S Take 2 tablets by mouth at bedtime.   Tab-A-Vite Tabs Take 1 tablet by mouth daily.   traMADol 50 MG tablet Commonly known as: ULTRAM Take 1 tablet (50 mg total) by  mouth every 8 (eight) hours. Give 1 tablet by mouth every 8 hours scheduled for pain   Vitamin D3 1.25 MG (50000 UT) Caps Take 1 capsule by mouth once a week.       No orders of the defined types were placed in this encounter.   Immunization History  Administered Date(s) Administered  . Influenza,inj,Quad PF,6+ Mos 04/16/2013, 05/06/2014  . Influenza-Unspecified 02/03/2015, 02/08/2016, 03/15/2017, 02/15/2018  . PPD Test 10/14/2014  . Pneumococcal Conjugate-13 01/05/2017    Social History   Tobacco Use  . Smoking status: Former Smoker    Packs/day: 0.50    Years: 50.00    Pack years: 25.00  . Smokeless tobacco: Never Used  Substance Use Topics  . Alcohol use: No    Alcohol/week: 0.0 standard drinks    Review of Systems  DATA OBTAINED: from patient, nurse GENERAL:  no fevers, fatigue, appetite changes SKIN: No itching, rash HEENT: No complaint RESPIRATORY: No cough, wheezing, SOB CARDIAC: No chest pain, palpitations, lower extremity edema  GI: No abdominal pain, No N/V/D or constipation, No heartburn or reflux  GU: No dysuria, frequency or urgency, or incontinence  MUSCULOSKELETAL: No unrelieved bone/joint pain NEUROLOGIC: No headache, dizziness  PSYCHIATRIC: No overt anxiety or sadness  Vitals:   01/27/19 2200  BP: 129/73  Pulse: 85  Resp: 16  Temp: 97.6 F (36.4 C)  SpO2: 95%   Body mass index is 23.43 kg/m. Physical Exam  GENERAL APPEARANCE: Alert, conversant, No acute distress  SKIN: No diaphoresis rash HEENT: Unremarkable RESPIRATORY: Breathing is even, unlabored. Lung sounds are clear   CARDIOVASCULAR: Heart RRR no murmurs, rubs or gallops. No peripheral edema  GASTROINTESTINAL: Abdomen is soft, non-tender, not distended w/ normal bowel sounds.  GENITOURINARY: Bladder non tender, not distended  MUSCULOSKELETAL: No abnormal joints or musculature NEUROLOGIC: Cranial nerves 2-12 grossly intact. Moves all extremities PSYCHIATRIC: Mood and affect  appropriate to situation with dementia, no behavioral issues  Patient Active Problem List   Diagnosis Date Noted  . Real time reverse transcriptase PCR positive for COVID-19 virus 12/16/2018  . Infection requiring airborne isolation precautions 12/16/2018  . Failure to thrive in adult 05/28/2018  . Allergic rhinitis 03/31/2018  . Osteoporosis 03/18/2016  . Depression 12/23/2015  . Chronic diastolic heart failure (HCC) 09/05/2015  . Right leg pain 08/05/2015  . COPD (chronic obstructive pulmonary disease) (HCC) 06/25/2015  . Loss of weight 01/21/2015  . Anemia, iron deficiency 01/05/2015  . Acute upper respiratory infection 01/05/2015  . Iron deficiency anemia 12/10/2014  . Stomach discomfort 11/30/2014  . Subacute confusional state 11/30/2014  . Acute respiratory failure with hypoxia (HCC) 11/11/2014  . AKI (acute kidney injury) (HCC) 11/11/2014  . Hypernatremia 11/11/2014  . Hypokalemia 11/09/2014  . COPD exacerbation (HCC)   . Acute on chronic diastolic heart failure (HCC)   . Hypoxia   .  SOB (shortness of breath)   . Pressure ulcer stage II 11/02/2014  . Palliative care encounter 11/02/2014  . HCAP (healthcare-associated pneumonia) 11/01/2014  . Leukocytosis 10/15/2014  . Closed fracture of part of upper end of humerus 10/14/2014  . Poor balance 11/25/2013  . History of stroke 11/25/2013  . 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 Myalgia 09/05/2012  . Disequilibrium 09/05/2012  . Hypertension, essential 09/05/2012  . 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 Mobility impaired 07/15/2012  . Physical exam, routine 12/07/2011  . Cerumen impaction 04/04/2011  . Trapezius muscle spasm 04/04/2011  . Hypertensive heart disease with CHF (congestive heart failure) (HCC) 11/30/2010  . Hyperlipidemia 11/30/2010  . CVA (cerebral infarction) 11/30/2010  . Postmenopausal HRT (hormone replacement therapy) 11/30/2010  . Anxiety 11/30/2010  . Ankle weakness 11/30/2010    CMP      Component Value Date/Time   NA 139 01/01/2019   K 4.7 01/01/2019   CL 102 10/13/2015 1505   CO2 22 10/13/2015 1505   GLUCOSE 118 (H) 10/13/2015 1505   BUN 25 (A) 01/01/2019   CREATININE 0.6 01/01/2019   CREATININE 0.79 10/13/2015 1505   CALCIUM 9.1 10/13/2015 1505   PROT 6.3 10/13/2015 1505   ALBUMIN 3.8 10/13/2015 1505   ALBUMIN 3.8 10/13/2015 1505   AST 20 07/08/2018   ALT 18 07/08/2018   ALKPHOS 91 07/08/2018   BILITOT 0.6 10/13/2015 1505   GFRNONAA >60 11/05/2014 0225   GFRAA >60 11/05/2014 0225   Recent Labs    07/08/18 12/12/18 01/01/19  NA 143 142 139  K 4.3 4.1 4.7  BUN 20 27* 25*  CREATININE 0.6 0.7 0.6   Recent Labs    07/08/18  AST 20  ALT 18  ALKPHOS 91   Recent Labs    07/08/18 12/12/18 01/01/19  WBC 6.1 6.2 6.4  NEUTROABS  --  4 3  HGB 12.4 12.1 12.0  HCT 36 36 35*  PLT 173 209 155   Recent Labs    07/08/18  CHOL 139  LDLCALC 80  TRIG 138   No results found for: [facility] Lab Results  Component Value Date   TSH 4.13 07/08/2018   Lab Results  Component Value Date   HGBA1C 5.6 04/10/2017   Lab Results  Component Value Date   CHOL 139 07/08/2018   HDL 35 07/08/2018   LDLCALC 80 07/08/2018   LDLDIRECT 190.1 05/06/2014   TRIG 138 07/08/2018   CHOLHDL 7 05/06/2014    Significant Diagnostic Results in last 30 days:  No results found.  Assessment and Plan  Anemia, iron deficiency Recent hemoglobin 14.6, which is excellent; continue iron 325 mg daily  Anxiety Stable; continue Ativan 0.5 mg every 8 hours  Failure to thrive in adult Not rapidly progressive; doing well with liberalize diet Remeron nightly and supportive care; continue to monitor closely    [name], MD

## 2019-01-27 NOTE — Assessment & Plan Note (Signed)
Not rapidly progressive; doing well with liberalize diet Remeron nightly and supportive care; continue to monitor closely

## 2019-01-27 NOTE — Assessment & Plan Note (Signed)
Stable; continue Ativan 0.5 mg every 8 hours

## 2019-02-14 ENCOUNTER — Other Ambulatory Visit: Payer: Self-pay | Admitting: Internal Medicine

## 2019-02-14 MED ORDER — TRAMADOL HCL 50 MG PO TABS: 50.0000 mg | ORAL_TABLET | Freq: Three times a day (TID) | ORAL | 0 refills | Status: DC

## 2019-02-24 ENCOUNTER — Other Ambulatory Visit: Payer: Self-pay | Admitting: Internal Medicine

## 2019-02-28 ENCOUNTER — Encounter: Payer: Self-pay | Admitting: Internal Medicine

## 2019-02-28 ENCOUNTER — Non-Acute Institutional Stay (SKILLED_NURSING_FACILITY): Payer: Medicare Other | Admitting: Internal Medicine

## 2019-02-28 DIAGNOSIS — I110 Hypertensive heart disease with heart failure: Secondary | ICD-10-CM | POA: Diagnosis not present

## 2019-02-28 DIAGNOSIS — I5032 Chronic diastolic (congestive) heart failure: Secondary | ICD-10-CM

## 2019-02-28 DIAGNOSIS — J3089 Other allergic rhinitis: Secondary | ICD-10-CM | POA: Diagnosis not present

## 2019-02-28 DIAGNOSIS — J42 Unspecified chronic bronchitis: Secondary | ICD-10-CM | POA: Diagnosis not present

## 2019-02-28 NOTE — Progress Notes (Signed)
Location:  [facility] Room Number: 411-P Place of Service:  SNF (31)  [name], MD  Patient Care Team: [name], MD as PCP - General (Internal Medicine)  Extended Emergency Contact Information Primary Emergency Contact: [name],[name] Address: [address]          [name] [address] [name] Home Phone: [phone] Relation: Daughter Secondary Emergency Contact: [name] [place] Home Phone: [phone] Relation: Daughter    Allergies: Sulfa antibiotics  Chief Complaint  Patient presents with  . Medical Management of Chronic Issues    Routine [facility] visit    HPI: Patient is an 85 y.o. female who is being seen for routine issues of hypertension, allergic rhinitis, and COPD.  Past Medical History:  Diagnosis Date  . Acute on chronic diastolic heart failure (HCC)   . Aneurysm (HCC)    of the eye  . Anxiety 11/30/2010  . Closed fracture of part of upper end of humerus 10/14/2014  . COPD exacerbation (HCC)   . CVA (cerebral infarction) 11/30/2010  . Decubitus ulcer of sacral region, stage 4 (HCC)   . 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 Depression 12/23/2015  . History of stroke 11/25/2013  . Hyperlipidemia   . Hypertension   . Hypertensive heart disease with CHF (congestive heart failure) (HCC) 11/30/2010  . Iron deficiency anemia 12/10/2014  . Leukocytosis 10/15/2014  . Osteoporosis   . Postmenopausal HRT (hormone replacement therapy) 11/30/2010  . Stroke (HCC) 10/2008  . 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 EYE SURGERY    . PARTIAL HYSTERECTOMY  1968    Allergies as of 02/28/2019      Reactions   Sulfa Antibiotics    Cant remember reaction      Medication List       Accurate as of February 28, 2019 11:59 PM. If you have any questions,  ask your nurse or doctor.        acetaminophen 325 MG tablet Commonly known as: TYLENOL Take 650 mg by mouth every 6 (six) hours as needed for mild pain.   amLODipine 10 MG tablet Commonly known as: NORVASC TAKE 1 TABLET AT BEDTIME   aspirin 81 MG chewable tablet Chew 81 mg by mouth daily.   atenolol 50 MG tablet Commonly known as: TENORMIN TAKE 1 TABLET TWICE A DAY   atorvastatin 40 MG tablet Commonly known as: LIPITOR TAKE 1 TABLET DAILY   BIOFREEZE EX Apply 1 application topically 2 (two) times daily. Apply to bilateral knees   bisacodyl 10 MG suppository Commonly known as: DULCOLAX Place 10 mg rectally as needed for moderate constipation.   carboxymethylcellul-glycerin 0.5-0.9 % ophthalmic solution Commonly known as: REFRESH OPTIVE Place 1 drop into both eyes. 6 times a day   ezetimibe 10 MG tablet Commonly known as: ZETIA Take 10 mg by mouth daily.   ferrous sulfate 325 (65 FE) MG tablet Take 325 mg by mouth daily with breakfast.   fexofenadine 180 MG tablet Commonly known as: ALLEGRA Take 180 mg by mouth daily.   LORazepam 0.5 MG tablet Commonly known as: Ativan Take 1 tablet (0.5 mg total) by mouth every 8 (eight) hours.   LORazepam 0.5 MG tablet Commonly known as: ATIVAN Take 0.5 mg by mouth daily as needed for anxiety. x14 days   magnesium hydroxide 400  MG/5ML suspension Commonly known as: MILK OF MAGNESIA Take 30 mLs by mouth daily as needed for mild constipation.   memantine 10 MG tablet Commonly known as: NAMENDA Take 10 mg by mouth 2 (two) times daily.   mirtazapine 30 MG tablet Commonly known as: REMERON Take 30 mg by mouth at bedtime.   Pataday 0.1 % ophthalmic solution Generic drug: olopatadine Place 1 drop into both eyes daily.   polyethylene glycol 17 g packet Commonly known as: MIRALAX / GLYCOLAX Take 17 g by mouth daily. To be hold for diarrhea   potassium chloride SA 20 MEQ tablet Commonly known as: KLOR-CON Take 20 mEq  by mouth daily.   sennosides-docusate sodium 8.6-50 MG tablet Commonly known as: SENOKOT-S Take 2 tablets by mouth at bedtime.   Tab-A-Vite Tabs Take 1 tablet by mouth daily.   traMADol 50 MG tablet Commonly known as: ULTRAM Take 1 tablet (50 mg total) by mouth every 8 (eight) hours. Give 1 tablet by mouth every 8 hours scheduled for pain   Vitamin D3 1.25 MG (50000 UT) Caps Take 1 capsule by mouth once a week.       No orders of the defined types were placed in this encounter.   Immunization History  Administered Date(s) Administered  . Influenza,inj,Quad PF,6+ Mos 04/16/2013, 05/06/2014  . Influenza-Unspecified 02/03/2015, 03/15/2017, 02/15/2018, 02/06/2019  . PPD Test 10/14/2014  . Pneumococcal Conjugate-13 01/05/2017  . Pneumococcal Polysaccharide-23 01/27/2019    Social History   Tobacco Use  . Smoking status: Former Smoker    Packs/day: 0.50    Years: 50.00    Pack years: 25.00  . Smokeless tobacco: Never Used  Substance Use Topics  . Alcohol use: No    Alcohol/week: 0.0 standard drinks    Review of Systems  DATA OBTAINED: from patient-limited; nursing-no acute concerns GENERAL:  no fevers, fatigue, appetite changes SKIN: No itching, rash HEENT: No complaint RESPIRATORY: No cough, wheezing, SOB CARDIAC: No chest pain, palpitations, lower extremity edema  GI: No abdominal pain, No N/V/D or constipation, No heartburn or reflux  GU: No dysuria, frequency or urgency, or incontinence  MUSCULOSKELETAL: No unrelieved bone/joint pain NEUROLOGIC: No headache, dizziness  PSYCHIATRIC: No overt anxiety or sadness  Vitals:   02/28/19 1545  BP: 112/63  Pulse: 70  Resp: 18  Temp: (!) 97.3 F (36.3 C)   Body mass index is 23.96 kg/m. Physical Exam  GENERAL APPEARANCE: Alert, conversant, No acute distress  SKIN: No diaphoresis rash HEENT: Unremarkable RESPIRATORY: Breathing is even, unlabored. Lung sounds are clear   CARDIOVASCULAR: Heart RRR no murmurs,  rubs or gallops. No peripheral edema  GASTROINTESTINAL: Abdomen is soft, non-tender, not distended w/ normal bowel sounds.  GENITOURINARY: Bladder non tender, not distended  MUSCULOSKELETAL: No abnormal joints or musculature NEUROLOGIC: Cranial nerves 2-12 grossly intact. Moves all extremities PSYCHIATRIC: Mood and affect appropriate with dementia, no behavioral issues  Patient Active Problem List   Diagnosis Date Noted  . Real time reverse transcriptase PCR positive for COVID-19 virus 12/16/2018  . Infection requiring airborne isolation precautions 12/16/2018  . Failure to thrive in adult 05/28/2018  . Allergic rhinitis 03/31/2018  . Osteoporosis 03/18/2016  . Depression 12/23/2015  . Chronic diastolic heart failure (HCC) 09/05/2015  . Right leg pain 08/05/2015  . COPD (chronic obstructive pulmonary disease) (HCC) 06/25/2015  . Loss of weight 01/21/2015  . Anemia, iron deficiency 01/05/2015  . Acute upper respiratory infection 01/05/2015  . Iron deficiency anemia 12/10/2014  . Stomach discomfort 11/30/2014  .  Subacute confusional state 11/30/2014  . Acute respiratory failure with hypoxia ([place]) 11/11/2014  . AKI (acute kidney injury) ([place]) 11/11/2014  . Hypernatremia 11/11/2014  . Hypokalemia 11/09/2014  . COPD exacerbation ([place])   . Acute on chronic diastolic heart failure ([place])   . Hypoxia   . SOB (shortness of breath)   . Pressure ulcer stage II 11/02/2014  . Palliative care encounter 11/02/2014  . HCAP (healthcare-associated pneumonia) 11/01/2014  . Leukocytosis 10/15/2014  . Closed fracture of part of upper end of humerus 10/14/2014  . Poor balance 11/25/2013  . History of stroke 11/25/2013  . Vascular parkinsonism ([place]) 11/25/2013  . Myalgia 09/05/2012  . Disequilibrium 09/05/2012  . Hypertension, essential 09/05/2012  . 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 Mobility impaired 07/15/2012  . Physical exam, routine 12/07/2011  . Cerumen impaction 04/04/2011  .  Trapezius muscle spasm 04/04/2011  . Hypertensive heart disease with CHF (congestive heart failure) ([place]) 11/30/2010  . Hyperlipidemia 11/30/2010  . CVA (cerebral infarction) 11/30/2010  . Postmenopausal HRT (hormone replacement therapy) 11/30/2010  . Anxiety 11/30/2010  . Ankle weakness 11/30/2010    CMP     Component Value Date/Time   NA 139 01/01/2019   K 4.7 01/01/2019   CL 102 10/13/2015 1505   CO2 22 10/13/2015 1505   GLUCOSE 118 (H) 10/13/2015 1505   BUN 25 (A) 01/01/2019   CREATININE 0.6 01/01/2019   CREATININE 0.79 10/13/2015 1505   CALCIUM 9.1 10/13/2015 1505   PROT 6.3 10/13/2015 1505   ALBUMIN 3.8 10/13/2015 1505   ALBUMIN 3.8 10/13/2015 1505   AST 20 07/08/2018   ALT 18 07/08/2018   ALKPHOS 91 07/08/2018   BILITOT 0.6 10/13/2015 1505   GFRNONAA >60 11/05/2014 0225   GFRAA >60 11/05/2014 0225   Recent Labs    07/08/18 12/12/18 01/01/19  NA 143 142 139  K 4.3 4.1 4.7  BUN 20 27* 25*  CREATININE 0.6 0.7 0.6   Recent Labs    07/08/18  AST 20  ALT 18  ALKPHOS 91   Recent Labs    07/08/18 12/12/18 01/01/19  WBC 6.1 6.2 6.4  NEUTROABS  --  4 3  HGB 12.4 12.1 12.0  HCT 36 36 35*  PLT 173 209 155   Recent Labs    07/08/18  CHOL 139  LDLCALC 80  TRIG 138   No results found for: [facility] Lab Results  Component Value Date   TSH 4.13 07/08/2018   Lab Results  Component Value Date   HGBA1C 5.6 04/10/2017   Lab Results  Component Value Date   CHOL 139 07/08/2018   HDL 35 07/08/2018   LDLCALC 80 07/08/2018   LDLDIRECT 190.1 05/06/2014   TRIG 138 07/08/2018   CHOLHDL 7 05/06/2014    Significant Diagnostic Results in last 30 days:  No results found.  Assessment and Plan  Hypertensive heart disease with CHF (congestive heart failure) (HCC) Chronic and stable; continue Tenormin 50 mg twice daily and Norvasc 10 mg daily  Allergic rhinitis Stable; continue Allegra 180 mg p.o. daily  COPD (chronic obstructive pulmonary disease) ([place]) No  reported exacerbations; continue Allegra 180 mg daily and as needed albuterol      [name], MD

## 2019-03-02 ENCOUNTER — Encounter: Payer: Self-pay | Admitting: Internal Medicine

## 2019-03-02 NOTE — Assessment & Plan Note (Signed)
Stable; continue Allegra 180 mg p.o. daily

## 2019-03-02 NOTE — Assessment & Plan Note (Signed)
No reported exacerbations; continue Allegra 180 mg daily and as needed albuterol

## 2019-03-02 NOTE — Assessment & Plan Note (Signed)
Chronic and stable; continue Tenormin 50 mg twice daily and Norvasc 10 mg daily

## 2019-03-25 ENCOUNTER — Other Ambulatory Visit: Payer: Self-pay | Admitting: Internal Medicine

## 2019-03-25 ENCOUNTER — Non-Acute Institutional Stay (SKILLED_NURSING_FACILITY): Payer: Medicare Other | Admitting: Internal Medicine

## 2019-03-25 DIAGNOSIS — F0391 Unspecified dementia with behavioral disturbance: Secondary | ICD-10-CM

## 2019-03-25 DIAGNOSIS — F03911 Unspecified dementia, unspecified severity, with agitation: Secondary | ICD-10-CM

## 2019-03-25 DIAGNOSIS — F419 Anxiety disorder, unspecified: Secondary | ICD-10-CM

## 2019-03-25 MED ORDER — LORAZEPAM 0.5 MG PO TABS: 0.5000 mg | ORAL_TABLET | Freq: Four times a day (QID) | ORAL | 2 refills | Status: DC

## 2019-03-28 ENCOUNTER — Encounter: Payer: Self-pay | Admitting: Internal Medicine

## 2019-03-28 ENCOUNTER — Non-Acute Institutional Stay (SKILLED_NURSING_FACILITY): Payer: Medicare Other | Admitting: Internal Medicine

## 2019-03-28 DIAGNOSIS — G214 Vascular parkinsonism: Secondary | ICD-10-CM

## 2019-03-28 DIAGNOSIS — I5032 Chronic diastolic (congestive) heart failure: Secondary | ICD-10-CM

## 2019-03-28 DIAGNOSIS — E559 Vitamin D deficiency, unspecified: Secondary | ICD-10-CM

## 2019-03-28 NOTE — Progress Notes (Signed)
Location:  [facility] Room Number: [id number] Place of Service:  SNF (31)  [name], MD  Patient Care Team: [name], MD as PCP - General (Internal Medicine)  Extended Emergency Contact Information Primary Emergency Contact: [name],[name] Address: [address]          [name] [address] [name] Home Phone: [phone] Relation: Daughter Secondary Emergency Contact: [name] [place] Home Phone: [phone] Relation: Daughter    Allergies: Sulfa antibiotics  Chief Complaint  Patient presents with  . Medical Management of Chronic Issues    Routine visit of medical management    HPI: Patient is 85 y.o. female who is being seen for routine issues of congestive heart failure, vascular Parkinson's, and vitamin D deficiency.  Past Medical History:  Diagnosis Date  . Acute on chronic diastolic heart failure (HCC)   . Aneurysm (HCC)    of the eye  . Anxiety 11/30/2010  . Closed fracture of part of upper end of humerus 10/14/2014  . COPD exacerbation (HCC)   . CVA (cerebral infarction) 11/30/2010  . Decubitus ulcer of sacral region, stage 4 (HCC)   . 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 Depression 12/23/2015  . History of stroke 11/25/2013  . Hyperlipidemia   . Hypertension   . Hypertensive heart disease with CHF (congestive heart failure) (HCC) 11/30/2010  . Iron deficiency anemia 12/10/2014  . Leukocytosis 10/15/2014  . Osteoporosis   . Postmenopausal HRT (hormone replacement therapy) 11/30/2010  . Stroke (HCC) 10/2008  . 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 EYE SURGERY    . PARTIAL HYSTERECTOMY  1968    Allergies as of 03/28/2019      Reactions   Sulfa Antibiotics    Cant remember reaction      Medication List       Accurate as of March 28, 2019  11:59 PM. If you have any questions, ask your nurse or doctor.        STOP taking these medications   LORazepam 0.5 MG tablet Commonly known as: Ativan Stopped by: Merrilee Seashore, MD     TAKE these medications   acetaminophen 325 MG tablet Commonly known as: TYLENOL Take 650 mg by mouth every 6 (six) hours as needed for mild pain.   amLODipine 10 MG tablet Commonly known as: NORVASC TAKE 1 TABLET AT BEDTIME   aspirin 81 MG chewable tablet Chew 81 mg by mouth daily.   atenolol 50 MG tablet Commonly known as: TENORMIN TAKE 1 TABLET TWICE A DAY   atorvastatin 40 MG tablet Commonly known as: LIPITOR TAKE 1 TABLET DAILY   BIOFREEZE EX Apply 1 application topically 2 (two) times daily. Apply to bilateral knees   bisacodyl 10 MG suppository Commonly known as: DULCOLAX Place 10 mg rectally as needed for moderate constipation.   carboxymethylcellul-glycerin 0.5-0.9 % ophthalmic solution Commonly known as: REFRESH OPTIVE Place 1 drop into both eyes. 6 times a day   Ensure Take 237 mLs by mouth daily.   ezetimibe 10 MG tablet Commonly known as: ZETIA Take 10 mg by mouth daily.   ferrous sulfate 325 (65 FE) MG tablet Take 325 mg by mouth daily with breakfast.   fexofenadine 180 MG tablet Commonly known as: ALLEGRA Take 180 mg by mouth daily.   guaifenesin 400 MG Tabs tablet Commonly known  as: HUMIBID E Take 400 mg by mouth 2 (two) times daily.   magnesium hydroxide 400 MG/5ML suspension Commonly known as: MILK OF MAGNESIA Take 30 mLs by mouth daily as needed for mild constipation.   memantine 10 MG tablet Commonly known as: NAMENDA Take 10 mg by mouth 2 (two) times daily.   mirtazapine 45 MG tablet Commonly known as: REMERON Take 45 mg by mouth daily. What changed: Another medication with the same name was removed. Continue taking this medication, and follow the directions you see here. Changed by: Merrilee SeashoreAnne Tryphena Perkovich, MD   Pataday 0.1 % ophthalmic solution  Generic drug: olopatadine Place 1 drop into both eyes daily.   polyethylene glycol 17 g packet Commonly known as: MIRALAX / GLYCOLAX Take 17 g by mouth daily. To be hold for diarrhea   potassium chloride SA 20 MEQ tablet Commonly known as: KLOR-CON Take 20 mEq by mouth daily.   sennosides-docusate sodium 8.6-50 MG tablet Commonly known as: SENOKOT-S Take 2 tablets by mouth at bedtime.   Tab-A-Vite Tabs Take 1 tablet by mouth daily.   traMADol 50 MG tablet Commonly known as: ULTRAM Take 1 tablet (50 mg total) by mouth every 8 (eight) hours. Give 1 tablet by mouth every 8 hours scheduled for pain   Vitamin D3 1.25 MG (50000 UT) Caps Take 1 capsule by mouth once a week.       No orders of the defined types were placed in this encounter.   Immunization History  Administered Date(s) Administered  . Influenza,inj,Quad PF,6+ Mos 04/16/2013, 05/06/2014  . Influenza-Unspecified 02/03/2015, 03/15/2017, 02/15/2018, 02/06/2019  . PPD Test 10/14/2014  . Pneumococcal Conjugate-13 01/05/2017  . Pneumococcal Polysaccharide-23 01/27/2019    Social History   Tobacco Use  . Smoking status: Former Smoker    Packs/day: 0.50    Years: 50.00    Pack years: 25.00  . Smokeless tobacco: Never Used  Substance Use Topics  . Alcohol use: No    Alcohol/week: 0.0 standard drinks    Review of Systems  DATA OBTAINED: from patient, nurse GENERAL:  no fevers, fatigue, appetite changes SKIN: No itching, rash HEENT: No complaint RESPIRATORY: No cough, wheezing, SOB CARDIAC: No chest pain, palpitations, lower extremity edema  GI: No abdominal pain, No N/V/D or constipation, No heartburn or reflux  GU: No dysuria, frequency or urgency, or incontinence  MUSCULOSKELETAL: No unrelieved bone/joint pain NEUROLOGIC: No headache, dizziness  PSYCHIATRIC: No overt anxiety or sadness  Vitals:   03/28/19 1332  BP: 115/64  Pulse: 79  Resp: 19  Temp: (!) 97.1 F (36.2 C)  SpO2: 94%   Body  mass index is 23.17 kg/m. Physical Exam  GENERAL APPEARANCE: Alert, conversant, No acute distress  SKIN: No diaphoresis rash HEENT: Unremarkable RESPIRATORY: Breathing is even, unlabored. Lung sounds are clear   CARDIOVASCULAR: Heart RRR no murmurs, rubs or gallops. No peripheral edema  GASTROINTESTINAL: Abdomen is soft, non-tender, not distended w/ normal bowel sounds.  GENITOURINARY: Bladder non tender, not distended  MUSCULOSKELETAL: No abnormal joints or musculature NEUROLOGIC: Cranial nerves 2-12 grossly intact. Moves all extremities PSYCHIATRIC: Mood and affect appropriate , no behavioral issues  Patient Active Problem List   Diagnosis Date Noted  . Vitamin D deficiency 03/30/2019  . Agitation due to dementia (HCC) 03/29/2019  . Real time reverse transcriptase PCR positive for COVID-19 virus 12/16/2018  . Infection requiring airborne isolation precautions 12/16/2018  . Failure to thrive in adult 05/28/2018  . Allergic rhinitis 03/31/2018  . Osteoporosis 03/18/2016  .  Depression 12/23/2015  . Chronic diastolic heart failure (HCC) 09/05/2015  . Right leg pain 08/05/2015  . COPD (chronic obstructive pulmonary disease) (HCC) 06/25/2015  . Loss of weight 01/21/2015  . Anemia, iron deficiency 01/05/2015  . Acute upper respiratory infection 01/05/2015  . Iron deficiency anemia 12/10/2014  . Stomach discomfort 11/30/2014  . Subacute confusional state 11/30/2014  . Acute respiratory failure with hypoxia (HCC) 11/11/2014  . AKI (acute kidney injury) (HCC) 11/11/2014  . Hypernatremia 11/11/2014  . Hypokalemia 11/09/2014  . COPD exacerbation (HCC)   . Acute on chronic diastolic heart failure (HCC)   . Hypoxia   . SOB (shortness of breath)   . Pressure ulcer stage II 11/02/2014  . Palliative care encounter 11/02/2014  . HCAP (healthcare-associated pneumonia) 11/01/2014  . Leukocytosis 10/15/2014  . Closed fracture of part of upper end of humerus 10/14/2014  . Poor balance  11/25/2013  . History of stroke 11/25/2013  . 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 Myalgia 09/05/2012  . Disequilibrium 09/05/2012  . Hypertension, essential 09/05/2012  . 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 Mobility impaired 07/15/2012  . Physical exam, routine 12/07/2011  . Cerumen impaction 04/04/2011  . Trapezius muscle spasm 04/04/2011  . Hypertensive heart disease with CHF (congestive heart failure) (HCC) 11/30/2010  . Hyperlipidemia 11/30/2010  . CVA (cerebral infarction) 11/30/2010  . Postmenopausal HRT (hormone replacement therapy) 11/30/2010  . Anxiety 11/30/2010  . Ankle weakness 11/30/2010    CMP     Component Value Date/Time   NA 139 01/01/2019   K 4.7 01/01/2019   CL 102 10/13/2015 1505   CO2 22 10/13/2015 1505   GLUCOSE 118 (H) 10/13/2015 1505   BUN 25 (A) 01/01/2019   CREATININE 0.6 01/01/2019   CREATININE 0.79 10/13/2015 1505   CALCIUM 9.1 10/13/2015 1505   PROT 6.3 10/13/2015 1505   ALBUMIN 3.8 10/13/2015 1505   ALBUMIN 3.8 10/13/2015 1505   AST 20 07/08/2018   ALT 18 07/08/2018   ALKPHOS 91 07/08/2018   BILITOT 0.6 10/13/2015 1505   GFRNONAA >60 11/05/2014 0225   GFRAA >60 11/05/2014 0225   Recent Labs    07/08/18 12/12/18 01/01/19  NA 143 142 139  K 4.3 4.1 4.7  BUN 20 27* 25*  CREATININE 0.6 0.7 0.6   Recent Labs    07/08/18  AST 20  ALT 18  ALKPHOS 91   Recent Labs    07/08/18 12/12/18 01/01/19  WBC 6.1 6.2 6.4  NEUTROABS  --  4 3  HGB 12.4 12.1 12.0  HCT 36 36 35*  PLT 173 209 155   Recent Labs    07/08/18  CHOL 139  LDLCALC 80  TRIG 138   No results found for: [facility] Lab Results  Component Value Date   TSH 4.13 07/08/2018   Lab Results  Component Value Date   HGBA1C 5.6 04/10/2017   Lab Results  Component Value Date   CHOL 139 07/08/2018   HDL 35 07/08/2018   LDLCALC 80 07/08/2018   LDLDIRECT 190.1 05/06/2014   TRIG 138 07/08/2018   CHOLHDL 7 05/06/2014    Significant Diagnostic Results in  last 30 days:  No results found.  Assessment and Plan  Chronic diastolic heart failure (HCC) Patient continues without known exacerbation; continue atenolol 50 mg twice daily and ASA 81 mg daily; patient has not required diuretics and weights are followed  Vascular parkinsonism Patient continues with no Parkinson's or Parkinson-like symptoms and is on no medications; will be monitoring  Vitamin  D deficiency 4 months ago vitamin D level is 31; patient has been getting 50,000 units weekly; will decrease to 50,000 units monthly and obtain a vitamin D level    [name], MD

## 2019-03-29 ENCOUNTER — Encounter: Payer: Self-pay | Admitting: Internal Medicine

## 2019-03-29 DIAGNOSIS — F03911 Unspecified dementia, unspecified severity, with agitation: Secondary | ICD-10-CM | POA: Insufficient documentation

## 2019-03-29 DIAGNOSIS — F0391 Unspecified dementia with behavioral disturbance: Secondary | ICD-10-CM | POA: Insufficient documentation

## 2019-03-29 NOTE — Progress Notes (Signed)
Location:   [facility]:   SNF  [name], MD  Patient Care Team: [name], MD as PCP - General (Internal Medicine)  Extended Emergency Contact Information Primary Emergency Contact: Beverly Black Address: [address]          [place] [address] [place] Phone: [phone] Relation: Daughter Secondary Emergency Contact: Beverly Black [place] Phone: [phone] Relation: Daughter    Allergies: Sulfa antibiotics  Chief Complaint  Patient presents with  . Acute Visit    HPI: Patient is 85 y.o. female who nursing asked me to see for behavior changes.  Patient gets agitated if she is not put in bed immediately, saying "put me to bed, put me to bed, put me to bed", when she is put in bed then she says get me out of bed get me out of bed get me out of bed etc. the agitation gets worse as the day wears on patient is most particularly not seeing anything does not failure or hearing anything that is not there.  Past Medical History:  Diagnosis Date  . Acute on chronic diastolic heart failure ([place])   . 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 Anxiety 11/30/2010  . Closed fracture of part of upper end of humerus 10/14/2014  . COPD exacerbation ([place])   . CVA (cerebral infarction) 11/30/2010  . Decubitus ulcer of sacral region, stage 4 ([place])   . Dementia due to Parkinson's disease without behavioral disturbance ([place]) 12/31/2014  . Depression 12/23/2015  . History of stroke 11/25/2013  . Hyperlipidemia   . Hypertension   . Hypertensive heart disease with CHF (congestive heart failure) ([place]) 11/30/2010  . Iron deficiency anemia 12/10/2014  . Leukocytosis 10/15/2014  . Osteoporosis   . Postmenopausal HRT (hormone replacement therapy) 11/30/2010  . Stroke ([place]) 10/2008  . 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 EYE SURGERY    . PARTIAL  HYSTERECTOMY  1968    Allergies as of 03/25/2019      Reactions   Sulfa Antibiotics    Cant remember reaction      Medication List       Accurate as of March 25, 2019 11:59 PM. If you have any questions, ask your nurse or doctor.        acetaminophen 325 MG tablet Commonly known as: TYLENOL Take 650 mg by mouth every 6 (six) hours as needed for mild pain.   amLODipine 10 MG tablet Commonly known as: NORVASC TAKE 1 TABLET AT BEDTIME   aspirin 81 MG chewable tablet Chew 81 mg by mouth daily.   atenolol 50 MG tablet Commonly known as: TENORMIN TAKE 1 TABLET TWICE A DAY   atorvastatin 40 MG tablet Commonly known as: LIPITOR TAKE 1 TABLET DAILY   BIOFREEZE EX Apply 1 application topically 2 (two) times daily. Apply to bilateral knees   bisacodyl 10 MG suppository Commonly known as: DULCOLAX Place 10 mg rectally as needed for moderate constipation.   carboxymethylcellul-glycerin 0.5-0.9 % ophthalmic solution Commonly known as: REFRESH OPTIVE Place 1 drop into both eyes. 6 times a day   ezetimibe 10 MG tablet Commonly known as: ZETIA Take 10 mg by mouth daily.   ferrous sulfate 325 (65 FE) MG tablet Take 325 mg by mouth daily with breakfast.   fexofenadine 180 MG tablet Commonly known as: ALLEGRA  Take 180 mg by mouth daily.   LORazepam 0.5 MG tablet Commonly known as: Ativan Take 1 tablet (0.5 mg total) by mouth every 6 (six) hours. What changed: when to take this Changed by: Merrilee Seashore, MD   magnesium hydroxide 400 MG/5ML suspension Commonly known as: MILK OF MAGNESIA Take 30 mLs by mouth daily as needed for mild constipation.   memantine 10 MG tablet Commonly known as: NAMENDA Take 10 mg by mouth 2 (two) times daily.   mirtazapine 30 MG tablet Commonly known as: REMERON Take 30 mg by mouth at bedtime.   Pataday 0.1 % ophthalmic solution Generic drug: olopatadine Place 1 drop into both eyes daily.   polyethylene glycol 17 g packet  Commonly known as: MIRALAX / GLYCOLAX Take 17 g by mouth daily. To be hold for diarrhea   potassium chloride SA 20 MEQ tablet Commonly known as: KLOR-CON Take 20 mEq by mouth daily.   sennosides-docusate sodium 8.6-50 MG tablet Commonly known as: SENOKOT-S Take 2 tablets by mouth at bedtime.   Tab-A-Vite Tabs Take 1 tablet by mouth daily.   traMADol 50 MG tablet Commonly known as: ULTRAM Take 1 tablet (50 mg total) by mouth every 8 (eight) hours. Give 1 tablet by mouth every 8 hours scheduled for pain   Vitamin D3 1.25 MG (50000 UT) Caps Take 1 capsule by mouth once a week.       No orders of the defined types were placed in this encounter.   Immunization History  Administered Date(s) Administered  . Influenza,inj,Quad PF,6+ Mos 04/16/2013, 05/06/2014  . Influenza-Unspecified 02/03/2015, 03/15/2017, 02/15/2018, 02/06/2019  . PPD Test 10/14/2014  . Pneumococcal Conjugate-13 01/05/2017  . Pneumococcal Polysaccharide-23 01/27/2019    Social History   Tobacco Use  . Smoking status: Former Smoker    Packs/day: 0.50    Years: 50.00    Pack years: 25.00  . Smokeless tobacco: Never Used  Substance Use Topics  . Alcohol use: No    Alcohol/week: 0.0 standard drinks    Review of Systems  DATA OBTAINED: from nurse-as per history present illness GENERAL:  no fevers, fatigue, appetite changes SKIN: No itching, rash HEENT: No complaint RESPIRATORY: No cough, wheezing, SOB CARDIAC: No chest pain, palpitations, lower extremity edema  GI: No abdominal pain, No N/V/D or constipation, No heartburn or reflux  GU: No dysuria, frequency or urgency, or incontinence  MUSCULOSKELETAL: No unrelieved bone/joint pain NEUROLOGIC: No headache, dizziness  PSYCHIATRIC: Increased anxiety and agitation, increasing with patient's dementia.  Vitals:   03/29/19 2214  BP: 122/87  Pulse: 64  Resp: 18  Temp: (!) 97 F (36.1 C)   Body mass index is 23.17 kg/m. Physical Exam  GENERAL  APPEARANCE: Alert, conversant, No acute distress  SKIN: No diaphoresis rash HEENT: Unremarkable RESPIRATORY: Breathing is even, unlabored. Lung sounds are clear   CARDIOVASCULAR: Heart RRR no murmurs, rubs or gallops. No peripheral edema  GASTROINTESTINAL: Abdomen is soft, non-tender, not distended w/ normal bowel sounds.  GENITOURINARY: Bladder non tender, not distended  MUSCULOSKELETAL: No abnormal joints or musculature NEUROLOGIC: Cranial nerves 2-12 grossly intact. Moves all extremities PSYCHIATRIC: Mood and affect pleasant with dementia, no behavioral issues at this moment  Patient Active Problem List   Diagnosis Date Noted  . Real time reverse transcriptase PCR positive for COVID-19 virus 12/16/2018  . Infection requiring airborne isolation precautions 12/16/2018  . Failure to thrive in adult 05/28/2018  . Allergic rhinitis 03/31/2018  . Osteoporosis 03/18/2016  . Depression 12/23/2015  . Chronic  diastolic heart failure (HCC) 09/05/2015  . Right leg pain 08/05/2015  . COPD (chronic obstructive pulmonary disease) (HCC) 06/25/2015  . Loss of weight 01/21/2015  . Anemia, iron deficiency 01/05/2015  . Acute upper respiratory infection 01/05/2015  . Iron deficiency anemia 12/10/2014  . Stomach discomfort 11/30/2014  . Subacute confusional state 11/30/2014  . Acute respiratory failure with hypoxia (HCC) 11/11/2014  . AKI (acute kidney injury) (HCC) 11/11/2014  . Hypernatremia 11/11/2014  . Hypokalemia 11/09/2014  . COPD exacerbation (HCC)   . Acute on chronic diastolic heart failure (HCC)   . Hypoxia   . SOB (shortness of breath)   . Pressure ulcer stage II 11/02/2014  . Palliative care encounter 11/02/2014  . HCAP (healthcare-associated pneumonia) 11/01/2014  . Leukocytosis 10/15/2014  . Closed fracture of part of upper end of humerus 10/14/2014  . Poor balance 11/25/2013  . History of stroke 11/25/2013  . 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 Myalgia 09/05/2012  .  Disequilibrium 09/05/2012  . Hypertension, essential 09/05/2012  . 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 Mobility impaired 07/15/2012  . Physical exam, routine 12/07/2011  . Cerumen impaction 04/04/2011  . Trapezius muscle spasm 04/04/2011  . Hypertensive heart disease with CHF (congestive heart failure) (HCC) 11/30/2010  . Hyperlipidemia 11/30/2010  . CVA (cerebral infarction) 11/30/2010  . Postmenopausal HRT (hormone replacement therapy) 11/30/2010  . Anxiety 11/30/2010  . Ankle weakness 11/30/2010    CMP     Component Value Date/Time   NA 139 01/01/2019   K 4.7 01/01/2019   CL 102 10/13/2015 1505   CO2 22 10/13/2015 1505   GLUCOSE 118 (H) 10/13/2015 1505   BUN 25 (A) 01/01/2019   CREATININE 0.6 01/01/2019   CREATININE 0.79 10/13/2015 1505   CALCIUM 9.1 10/13/2015 1505   PROT 6.3 10/13/2015 1505   ALBUMIN 3.8 10/13/2015 1505   ALBUMIN 3.8 10/13/2015 1505   AST 20 07/08/2018   ALT 18 07/08/2018   ALKPHOS 91 07/08/2018   BILITOT 0.6 10/13/2015 1505   GFRNONAA >60 11/05/2014 0225   GFRAA >60 11/05/2014 0225   Recent Labs    07/08/18 12/12/18 01/01/19  NA 143 142 139  K 4.3 4.1 4.7  BUN 20 27* 25*  CREATININE 0.6 0.7 0.6   Recent Labs    07/08/18  AST 20  ALT 18  ALKPHOS 91   Recent Labs    07/08/18 12/12/18 01/01/19  WBC 6.1 6.2 6.4  NEUTROABS  --  4 3  HGB 12.4 12.1 12.0  HCT 36 36 35*  PLT 173 209 155   Recent Labs    07/08/18  CHOL 139  LDLCALC 80  TRIG 138   No results found for: [facility] Lab Results  Component Value Date   TSH 4.13 07/08/2018   Lab Results  Component Value Date   HGBA1C 5.6 04/10/2017   Lab Results  Component Value Date   CHOL 139 07/08/2018   HDL 35 07/08/2018   LDLCALC 80 07/08/2018   LDLDIRECT 190.1 05/06/2014   TRIG 138 07/08/2018   CHOLHDL 7 05/06/2014    Significant Diagnostic Results in last 30 days:  No results found.  Assessment and Plan  Anxiety and agitation/dementia -patient is already on  Remeron 45 mg nightly tramadol 50 mg every 8 and Namenda twice daily and Ativan 0.5 mg q. 8-discussed at length with the head nurse on patient's floor when eyes are the best; have decided to increase her Ativan to every 6 hours as opposed to increasing it; will  monitor response    Beverly HanksAnne D Daje Stark, MD

## 2019-03-30 ENCOUNTER — Encounter: Payer: Self-pay | Admitting: Internal Medicine

## 2019-03-30 DIAGNOSIS — E559 Vitamin D deficiency, unspecified: Secondary | ICD-10-CM | POA: Insufficient documentation

## 2019-03-30 NOTE — Assessment & Plan Note (Signed)
Patient continues without known exacerbation; continue atenolol 50 mg twice daily and ASA 81 mg daily; patient has not required diuretics and weights are followed

## 2019-03-30 NOTE — Assessment & Plan Note (Signed)
Patient continues with no Parkinson's or Parkinson-like symptoms and is on no medications; will be monitoring

## 2019-03-30 NOTE — Assessment & Plan Note (Signed)
4 months ago vitamin D level is 43; patient has been getting 50,000 units weekly; will decrease to 50,000 units monthly and obtain a vitamin D level

## 2019-04-21 ENCOUNTER — Non-Acute Institutional Stay (SKILLED_NURSING_FACILITY): Payer: Medicare Other | Admitting: Internal Medicine

## 2019-04-21 DIAGNOSIS — Z789 Other specified health status: Secondary | ICD-10-CM | POA: Diagnosis not present

## 2019-04-21 NOTE — Progress Notes (Signed)
[facility] Room Number: [phone] Place of Service:  SNF [phone])  Margit HanksAlexander, Marbin Olshefski D, MD  Patient Care Team: Margit HanksAlexander, Randee Huston D, MD as PCP - General (Internal Medicine)  Extended Emergency Contact Information Primary Emergency Contact: [name],[name] Address: [address]          [place] [id number] [place] Home Phone: [phone] Relation: Daughter Secondary Emergency Contact: [name],[name]  [place] Home Phone: [phone] Relation: Daughter    Allergies: Sulfa antibiotics  Chief Complaint  Patient presents with  . Acute Visit    Patient is seen for COVID prophylaxis    HPI: Patient is 85 y.o. female who is being seen for viral prophylaxis.  Everyone in the facility is being placed on vitamin D vitamin C and zinc.  Past Medical History:  Diagnosis Date  . Acute on chronic diastolic heart failure (HCC)   . Aneurysm (HCC)    of the eye  . Anxiety 11/30/2010  . Closed fracture of part of upper end of humerus 10/14/2014  . COPD exacerbation (HCC)   . CVA (cerebral infarction) 11/30/2010  . Decubitus ulcer of sacral region, stage 4 (HCC)   . 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 Depression 12/23/2015  . History of stroke 11/25/2013  . Hyperlipidemia   . Hypertension   . Hypertensive heart disease with CHF (congestive heart failure) (HCC) 11/30/2010  . Iron deficiency anemia 12/10/2014  . Leukocytosis 10/15/2014  . Osteoporosis   . Postmenopausal HRT (hormone replacement therapy) 11/30/2010  . Stroke (HCC) 10/2008  . 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 EYE SURGERY    . PARTIAL HYSTERECTOMY  1968    Allergies as of 04/21/2019      Reactions   Sulfa Antibiotics    Cant remember reaction      Medication List       Accurate as of April 21, 2019 11:59 PM. If you have any  questions, ask your nurse or doctor.        STOP taking these medications   Vitamin D3 1.25 MG (50000 UT) Caps     TAKE these medications   acetaminophen 325 MG tablet Commonly known as: TYLENOL Take 650 mg by mouth every 6 (six) hours as needed for mild pain.   amLODipine 10 MG tablet Commonly known as: NORVASC TAKE 1 TABLET AT BEDTIME   ascorbic acid 500 MG tablet Commonly known as: VITAMIN C Take 500 mg by mouth daily.   aspirin 81 MG chewable tablet Chew 81 mg by mouth daily.   atenolol 50 MG tablet Commonly known as: TENORMIN TAKE 1 TABLET TWICE A DAY   atorvastatin 40 MG tablet Commonly known as: LIPITOR TAKE 1 TABLET DAILY   BIOFREEZE EX Apply 1 application topically 2 (two) times daily. Apply to bilateral knees   bisacodyl 10 MG suppository Commonly known as: DULCOLAX Place 10 mg rectally as needed for moderate constipation.   carboxymethylcellul-glycerin 0.5-0.9 % ophthalmic solution Commonly known as: REFRESH OPTIVE Place 1 drop into both eyes. 6 times a day   Ensure Take 237 mLs by mouth daily.   ezetimibe 10 MG tablet Commonly known as: ZETIA Take 10 mg by mouth daily.   ferrous sulfate 325 (65 FE) MG tablet Take 325 mg by mouth daily with breakfast.   fexofenadine 180 MG tablet Commonly known as: ALLEGRA  Take 180 mg by mouth daily.   guaifenesin 400 MG Tabs tablet Commonly known as: HUMIBID E Take 400 mg by mouth 2 (two) times daily.   LORazepam 0.5 MG tablet Commonly known as: ATIVAN Take 0.5 mg by mouth every 6 (six) hours.   magnesium hydroxide 400 MG/5ML suspension Commonly known as: MILK OF MAGNESIA Take 30 mLs by mouth daily as needed for mild constipation.   memantine 10 MG tablet Commonly known as: NAMENDA Take 10 mg by mouth 2 (two) times daily.   mirtazapine 45 MG tablet Commonly known as: REMERON Take 45 mg by mouth daily.   Pataday 0.1 % ophthalmic solution Generic drug: olopatadine Place 1 drop into both eyes  daily.   polyethylene glycol 17 g packet Commonly known as: MIRALAX / GLYCOLAX Take 17 g by mouth daily. To be hold for diarrhea   potassium chloride SA 20 MEQ tablet Commonly known as: KLOR-CON Take 20 mEq by mouth daily.   sennosides-docusate sodium 8.6-50 MG tablet Commonly known as: SENOKOT-S Take 2 tablets by mouth at bedtime.   Tab-A-Vite Tabs Take 1 tablet by mouth daily.   traMADol 50 MG tablet Commonly known as: ULTRAM Take 1 tablet (50 mg total) by mouth every 8 (eight) hours. Give 1 tablet by mouth every 8 hours scheduled for pain   zinc sulfate 220 (50 Zn) MG capsule Take 220 mg by mouth every other day.       No orders of the defined types were placed in this encounter.   Immunization History  Administered Date(s) Administered  . Influenza,inj,Quad PF,6+ Mos 04/16/2013, 05/06/2014  . Influenza-Unspecified 02/03/2015, 03/15/2017, 02/15/2018, 02/06/2019  . PPD Test 10/14/2014  . Pneumococcal Conjugate-13 01/05/2017  . Pneumococcal Polysaccharide-23 01/27/2019    Social History   Tobacco Use  . Smoking status: Former Smoker    Packs/day: 0.50    Years: 50.00    Pack years: 25.00  . Smokeless tobacco: Never Used  Substance Use Topics  . Alcohol use: No    Alcohol/week: 0.0 standard drinks    Review of Systems  GENERAL:  no fevers, fatigue, appetite changes SKIN: No itching, rash HEENT: No complaint RESPIRATORY: No cough, wheezing, SOB CARDIAC: No chest pain, palpitations, lower extremity edema  GI: No abdominal pain, No N/V/D or constipation, No heartburn or reflux  GU: No dysuria, frequency or urgency, or incontinence  MUSCULOSKELETAL: No unrelieved bone/joint pain NEUROLOGIC: No headache, dizziness  PSYCHIATRIC: No overt anxiety or sadness  Vitals:   04/25/19 1552  BP: 109/66  Pulse: 69  Resp: 18  Temp: (!) 97 F (36.1 C)   There is no height or weight on file to calculate BMI. Physical Exam  GENERAL APPEARANCE: Alert, conversant,  No acute distress  SKIN: No diaphoresis rash HEENT: Unremarkable RESPIRATORY: Breathing is even, unlabored. Lung sounds are clear   CARDIOVASCULAR: Heart RRR no murmurs, rubs or gallops. No peripheral related recommended of the meeting edema  GASTROINTESTINAL: Abdomen is soft, non-tender, not distended w/ normal renogram in chart judgment return to the home in chart for bilateral leg bowel sounds.  GENITOURINARY: Bladder non tender, not distended  MUSCULOSKELETAL: No abnormal joints or musculature NEUROLOGIC: Cranial nerves 2-12 grossly intact. Moves all extremities PSYCHIATRIC: Mood and affect appropriate with dementia no behavioral issues  Patient Active Problem List   Diagnosis Date Noted  . Vitamin D deficiency 03/30/2019  . Agitation due to dementia (HCC) 03/29/2019  . Real time reverse transcriptase PCR positive for COVID-19 virus 12/16/2018  . Infection  requiring airborne isolation precautions 12/16/2018  . Failure to thrive in adult 05/28/2018  . Allergic rhinitis 03/31/2018  . Osteoporosis 03/18/2016  . Depression 12/23/2015  . Chronic diastolic heart failure ([place]) 09/05/2015  . Right leg pain 08/05/2015  . COPD (chronic obstructive pulmonary disease) ([place]) 06/25/2015  . Loss of weight 01/21/2015  . Anemia, iron deficiency 01/05/2015  . Acute upper respiratory infection 01/05/2015  . Iron deficiency anemia 12/10/2014  . Stomach discomfort 11/30/2014  . Subacute confusional state 11/30/2014  . Acute respiratory failure with hypoxia ([place]) 11/11/2014  . AKI (acute kidney injury) ([place]) 11/11/2014  . Hypernatremia 11/11/2014  . Hypokalemia 11/09/2014  . COPD exacerbation ([place])   . Acute on chronic diastolic heart failure ([place])   . Hypoxia   . SOB (shortness of breath)   . Pressure ulcer stage II 11/02/2014  . Palliative care encounter 11/02/2014  . HCAP (healthcare-associated pneumonia) 11/01/2014  . Leukocytosis 10/15/2014  . Closed fracture of part of upper end of  humerus 10/14/2014  . Poor balance 11/25/2013  . History of stroke 11/25/2013  . Vascular parkinsonism ([place]) 11/25/2013  . Myalgia 09/05/2012  . Disequilibrium 09/05/2012  . Hypertension, essential 09/05/2012  . 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 Mobility impaired 07/15/2012  . Physical exam, routine 12/07/2011  . Cerumen impaction 04/04/2011  . Trapezius muscle spasm 04/04/2011  . Hypertensive heart disease with CHF (congestive heart failure) ([place]) 11/30/2010  . Hyperlipidemia 11/30/2010  . CVA (cerebral infarction) 11/30/2010  . Postmenopausal HRT (hormone replacement therapy) 11/30/2010  . Anxiety 11/30/2010  . Ankle weakness 11/30/2010    CMP     Component Value Date/Time   NA 139 01/01/2019 0000   K 4.7 01/01/2019 0000   CL 102 10/13/2015 1505   CO2 22 10/13/2015 1505   GLUCOSE 118 (H) 10/13/2015 1505   BUN 25 (A) 01/01/2019 0000   CREATININE 0.6 01/01/2019 0000   CREATININE 0.79 10/13/2015 1505   CALCIUM 9.1 10/13/2015 1505   PROT 6.3 10/13/2015 1505   ALBUMIN 3.8 10/13/2015 1505   ALBUMIN 3.8 10/13/2015 1505   AST 20 07/08/2018 0000   ALT 18 07/08/2018 0000   ALKPHOS 91 07/08/2018 0000   BILITOT 0.6 10/13/2015 1505   GFRNONAA >60 11/05/2014 0225   GFRAA >60 11/05/2014 0225   Recent Labs    07/08/18 0000 12/12/18 0000 01/01/19 0000  NA 143 142 139  K 4.3 4.1 4.7  BUN 20 27* 25*  CREATININE 0.6 0.7 0.6   Recent Labs    07/08/18 0000  AST 20  ALT 18  ALKPHOS 91   Recent Labs    07/08/18 0000 12/12/18 0000 01/01/19 0000  WBC 6.1 6.2 6.4  NEUTROABS  --  4 3  HGB 12.4 12.1 12.0  HCT 36 36 35*  PLT 173 209 155   Recent Labs    07/08/18 0000  CHOL 139  LDLCALC 80  TRIG 138   No results found for: [facility] Lab Results  Component Value Date   TSH 4.13 07/08/2018   Lab Results  Component Value Date   HGBA1C 5.6 04/10/2017   Lab Results  Component Value Date   CHOL 139 07/08/2018   HDL 35 07/08/2018   LDLCALC 80  07/08/2018   LDLDIRECT 190.1 05/06/2014   TRIG 138 07/08/2018   CHOLHDL 7 05/06/2014    Significant Diagnostic Results in last 30 days:  No results found.  Assessment and Plan  No immunity to COVID-19-patient is already on vitamin D 50,000 units weekly  which will be continued.  Patient is being written for vitamin C 500 mg daily for 90 days and zinc sulfate 220 mg every other day for 90 days.  Patient will have vitamin D level drawn today and again in 90 days.     [name], MD

## 2019-04-25 ENCOUNTER — Encounter: Payer: Self-pay | Admitting: Internal Medicine

## 2019-04-28 ENCOUNTER — Non-Acute Institutional Stay (SKILLED_NURSING_FACILITY): Payer: Medicare Other | Admitting: Internal Medicine

## 2019-04-28 DIAGNOSIS — M818 Other osteoporosis without current pathological fracture: Secondary | ICD-10-CM | POA: Diagnosis not present

## 2019-04-28 DIAGNOSIS — F03911 Unspecified dementia, unspecified severity, with agitation: Secondary | ICD-10-CM

## 2019-04-28 DIAGNOSIS — F329 Major depressive disorder, single episode, unspecified: Secondary | ICD-10-CM

## 2019-04-28 DIAGNOSIS — F0391 Unspecified dementia with behavioral disturbance: Secondary | ICD-10-CM

## 2019-04-28 DIAGNOSIS — F32A Depression, unspecified: Secondary | ICD-10-CM

## 2019-04-28 NOTE — Progress Notes (Signed)
Location:  [facility] Room Number: 885-O Place of Service:  SNF (31)  [name], MD  Patient Care Team: [name], MD as PCP - General (Internal Medicine)  Extended Emergency Contact Information Primary Emergency Contact: [name],[name] Address: [address]          [place] [address] [name] of [place] Phone: [phone] Relation: Daughter Secondary Emergency Contact: [name] [place] of [place] Phone: [phone] Relation: Daughter    Allergies: Sulfa antibiotics  Chief Complaint  Patient presents with  . Medical Management of Chronic Issues    Routine [facility] visit    HPI: Patient is 85 y.o. female who is being seen for routine issues of osteoporosis, dementia with agitation, and depression.  Past Medical History:  Diagnosis Date  . Acute on chronic diastolic heart failure ([place])   . 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 Anxiety 11/30/2010  . Closed fracture of part of upper end of humerus 10/14/2014  . COPD exacerbation ([place])   . CVA (cerebral infarction) 11/30/2010  . Decubitus ulcer of sacral region, stage 4 ([place])   . Dementia due to Parkinson's disease without behavioral disturbance ([place]) 12/31/2014  . Depression 12/23/2015  . History of stroke 11/25/2013  . Hyperlipidemia   . Hypertension   . Hypertensive heart disease with CHF (congestive heart failure) ([place]) 11/30/2010  . Iron deficiency anemia 12/10/2014  . Leukocytosis 10/15/2014  . Osteoporosis   . Postmenopausal HRT (hormone replacement therapy) 11/30/2010  . Stroke ([place]) 10/2008  . 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 EYE SURGERY    . PARTIAL HYSTERECTOMY  1968    Allergies as of 04/28/2019      Reactions   Sulfa Antibiotics    Cant remember reaction      Medication List       Accurate as of April 28, 2019 11:59 PM. If you have any  questions, ask your nurse or doctor.        acetaminophen 325 MG tablet Commonly known as: TYLENOL Take 650 mg by mouth every 6 (six) hours as needed for mild pain.   amLODipine 10 MG tablet Commonly known as: NORVASC TAKE 1 TABLET AT BEDTIME   ascorbic acid 500 MG tablet Commonly known as: VITAMIN C Take 500 mg by mouth daily.   aspirin 81 MG chewable tablet Chew 81 mg by mouth daily.   atenolol 50 MG tablet Commonly known as: TENORMIN TAKE 1 TABLET TWICE A DAY   atorvastatin 40 MG tablet Commonly known as: LIPITOR TAKE 1 TABLET DAILY   BIOFREEZE EX Apply 1 application topically 2 (two) times daily. Apply to bilateral knees   bisacodyl 10 MG suppository Commonly known as: DULCOLAX Place 10 mg rectally as needed for moderate constipation.   carboxymethylcellul-glycerin 0.5-0.9 % ophthalmic solution Commonly known as: REFRESH OPTIVE Place 1 drop into both eyes. 6 times a day   Ensure Take 237 mLs by mouth daily.   ezetimibe 10 MG tablet Commonly known as: ZETIA Take 10 mg by mouth daily.   ferrous sulfate 325 (65 FE) MG tablet Take 325 mg by mouth daily with breakfast.   fexofenadine 180 MG tablet Commonly known as: ALLEGRA Take 180 mg by mouth daily.   guaifenesin 400 MG Tabs tablet Commonly known as: HUMIBID E Take 400 mg by mouth 2 (two) times daily.  LORazepam 0.5 MG tablet Commonly known as: ATIVAN Take 0.5 mg by mouth every 6 (six) hours.   magnesium hydroxide 400 MG/5ML suspension Commonly known as: MILK OF MAGNESIA Take 30 mLs by mouth daily as needed for mild constipation.   memantine 10 MG tablet Commonly known as: NAMENDA Take 10 mg by mouth 2 (two) times daily.   mirtazapine 45 MG tablet Commonly known as: REMERON Take 45 mg by mouth daily.   Pataday 0.1 % ophthalmic solution Generic drug: olopatadine Place 1 drop into both eyes daily.   polyethylene glycol 17 g packet Commonly known as: MIRALAX / GLYCOLAX Take 17 g by mouth  daily. To be hold for diarrhea   potassium chloride SA 20 MEQ tablet Commonly known as: KLOR-CON Take 20 mEq by mouth daily.   sennosides-docusate sodium 8.6-50 MG tablet Commonly known as: SENOKOT-S Take 2 tablets by mouth at bedtime.   Tab-A-Vite Tabs Take 1 tablet by mouth daily.   traMADol 50 MG tablet Commonly known as: ULTRAM Take 1 tablet (50 mg total) by mouth every 6 (six) hours. Give 1 tablet by mouth every 8 hours scheduled for pain What changed: when to take this   zinc sulfate 220 (50 Zn) MG capsule Take 220 mg by mouth every other day.       Meds ordered this encounter  Medications  . traMADol (ULTRAM) 50 MG tablet    Sig: Take 1 tablet (50 mg total) by mouth every 6 (six) hours. Give 1 tablet by mouth every 8 hours scheduled for pain    Dispense:  240 tablet    Refill:  0    Immunization History  Administered Date(s) Administered  . Influenza,inj,Quad PF,6+ Mos 04/16/2013, 05/06/2014  . Influenza-Unspecified 02/03/2015, 03/15/2017, 02/15/2018, 02/06/2019  . PPD Test 10/14/2014  . Pneumococcal Conjugate-13 01/05/2017  . Pneumococcal Polysaccharide-23 01/27/2019    Social History   Tobacco Use  . Smoking status: Former Smoker    Packs/day: 0.50    Years: 50.00    Pack years: 25.00  . Smokeless tobacco: Never Used  Substance Use Topics  . Alcohol use: No    Alcohol/week: 0.0 standard drinks    Review of Systems   GENERAL:  no fevers, fatigue, appetite changes SKIN: No itching, rash HEENT: No complaint RESPIRATORY: No cough, wheezing, SOB CARDIAC: No chest pain, palpitations, lower extremity edema  GI: No abdominal pain, No N/V/D or constipation, No heartburn or reflux  GU: No dysuria, frequency or urgency, or incontinence  MUSCULOSKELETAL: No unrelieved bone/joint pain NEUROLOGIC: No headache, dizziness  PSYCHIATRIC: No overt anxiety or sadness; increased agitation  Vitals:   04/28/19 1357  BP: (!) 94/55  Pulse: 65  Resp: 18  Temp:  (!) 96.8 F (36 C)   Body mass index is 22.67 kg/m. Physical Exam  GENERAL APPEARANCE: Alert,  No acute distress  SKIN: No diaphoresis rash HEENT: Unremarkable RESPIRATORY: Breathing is even, unlabored. Lung sounds are clear   CARDIOVASCULAR: Heart RRR no murmurs, rubs or gallops. No peripheral edema  GASTROINTESTINAL: Abdomen is soft, non-tender, not distended w/ normal bowel sounds.  GENITOURINARY: Bladder non tender, not distended  MUSCULOSKELETAL: No abnormal joints or musculature NEUROLOGIC: Cranial nerves 2-12 grossly intact. Moves all extremities PSYCHIATRIC: Dementia, no behavioral issues  Patient Active Problem List   Diagnosis Date Noted  . Vitamin D deficiency 03/30/2019  . Agitation due to dementia (HCC) 03/29/2019  . Real time reverse transcriptase PCR positive for COVID-19 virus 12/16/2018  . Infection requiring airborne isolation precautions  12/16/2018  . Failure to thrive in adult 05/28/2018  . Allergic rhinitis 03/31/2018  . Osteoporosis 03/18/2016  . Depression 12/23/2015  . Chronic diastolic heart failure (HCC) 09/05/2015  . Right leg pain 08/05/2015  . COPD (chronic obstructive pulmonary disease) (HCC) 06/25/2015  . Loss of weight 01/21/2015  . Anemia, iron deficiency 01/05/2015  . Acute upper respiratory infection 01/05/2015  . Iron deficiency anemia 12/10/2014  . Stomach discomfort 11/30/2014  . Subacute confusional state 11/30/2014  . Acute respiratory failure with hypoxia (HCC) 11/11/2014  . AKI (acute kidney injury) (HCC) 11/11/2014  . Hypernatremia 11/11/2014  . Hypokalemia 11/09/2014  . COPD exacerbation (HCC)   . Acute on chronic diastolic heart failure (HCC)   . Hypoxia   . SOB (shortness of breath)   . Pressure ulcer stage II 11/02/2014  . Palliative care encounter 11/02/2014  . HCAP (healthcare-associated pneumonia) 11/01/2014  . Leukocytosis 10/15/2014  . Closed fracture of part of upper end of humerus 10/14/2014  . Poor balance  11/25/2013  . History of stroke 11/25/2013  . 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 Myalgia 09/05/2012  . Disequilibrium 09/05/2012  . Hypertension, essential 09/05/2012  . 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 Mobility impaired 07/15/2012  . Physical exam, routine 12/07/2011  . Cerumen impaction 04/04/2011  . Trapezius muscle spasm 04/04/2011  . Hypertensive heart disease with CHF (congestive heart failure) (HCC) 11/30/2010  . Hyperlipidemia 11/30/2010  . CVA (cerebral infarction) 11/30/2010  . Postmenopausal HRT (hormone replacement therapy) 11/30/2010  . Anxiety 11/30/2010  . Ankle weakness 11/30/2010    CMP     Component Value Date/Time   NA 139 01/01/2019 0000   K 4.7 01/01/2019 0000   CL 102 10/13/2015 1505   CO2 22 10/13/2015 1505   GLUCOSE 118 (H) 10/13/2015 1505   BUN 25 (A) 01/01/2019 0000   CREATININE 0.6 01/01/2019 0000   CREATININE 0.79 10/13/2015 1505   CALCIUM 9.1 10/13/2015 1505   PROT 6.3 10/13/2015 1505   ALBUMIN 3.8 10/13/2015 1505   ALBUMIN 3.8 10/13/2015 1505   AST 20 07/08/2018 0000   ALT 18 07/08/2018 0000   ALKPHOS 91 07/08/2018 0000   BILITOT 0.6 10/13/2015 1505   GFRNONAA >60 11/05/2014 0225   GFRAA >60 11/05/2014 0225   Recent Labs    07/08/18 0000 12/12/18 0000 01/01/19 0000  NA 143 142 139  K 4.3 4.1 4.7  BUN 20 27* 25*  CREATININE 0.6 0.7 0.6   Recent Labs    07/08/18 0000  AST 20  ALT 18  ALKPHOS 91   Recent Labs    07/08/18 0000 12/12/18 0000 01/01/19 0000  WBC 6.1 6.2 6.4  NEUTROABS  --  4 3  HGB 12.4 12.1 12.0  HCT 36 36 35*  PLT 173 209 155   Recent Labs    07/08/18 0000  CHOL 139  LDLCALC 80  TRIG 138   No results found for: [facility] Lab Results  Component Value Date   TSH 4.13 07/08/2018   Lab Results  Component Value Date   HGBA1C 5.6 04/10/2017   Lab Results  Component Value Date   CHOL 139 07/08/2018   HDL 35 07/08/2018   LDLCALC 80 07/08/2018   LDLDIRECT 190.1 05/06/2014    TRIG 138 07/08/2018   CHOLHDL 7 05/06/2014    Significant Diagnostic Results in last 30 days:  No results found.  Assessment and Plan  Osteoporosis Still no fracture; continue vitamin D 50,000 units weekly  Agitation due to dementia (HCC)  Has increased recently; will increase the interval of Ativan 0.5 mg from every 8 to every 6  Depression Was mildly out of control and Remeron was increased from 30 mg nightly to 45 mg nightly    [name], MD

## 2019-04-29 MED ORDER — TRAMADOL HCL 50 MG PO TABS: 50.0000 mg | ORAL_TABLET | Freq: Four times a day (QID) | ORAL | 0 refills | Status: AC

## 2019-05-04 ENCOUNTER — Encounter: Payer: Self-pay | Admitting: Internal Medicine

## 2019-05-04 NOTE — Assessment & Plan Note (Signed)
Still no fracture; continue vitamin D 50,000 units weekly

## 2019-05-04 NOTE — Assessment & Plan Note (Signed)
Has increased recently; will increase the interval of Ativan 0.5 mg from every 8 to every 6

## 2019-05-04 NOTE — Assessment & Plan Note (Signed)
Was mildly out of control and Remeron was increased from 30 mg nightly to 45 mg nightly

## 2019-05-12 ENCOUNTER — Other Ambulatory Visit: Payer: Self-pay | Admitting: Internal Medicine

## 2019-06-09 DEATH — deceased
